# Patient Record
Sex: Male | Born: 1945 | Race: Black or African American | Hispanic: No | Smoking: Never smoker
Health system: Southern US, Community
[De-identification: ages and names within clinical notes are randomized; demographics above are authoritative.]

## PROBLEM LIST (undated history)

## (undated) DIAGNOSIS — R0600 Dyspnea, unspecified: Secondary | ICD-10-CM

## (undated) DIAGNOSIS — E119 Type 2 diabetes mellitus without complications: Secondary | ICD-10-CM

## (undated) DIAGNOSIS — I10 Essential (primary) hypertension: Secondary | ICD-10-CM

## (undated) DIAGNOSIS — Q2112 Patent foramen ovale: Secondary | ICD-10-CM

## (undated) DIAGNOSIS — R011 Cardiac murmur, unspecified: Secondary | ICD-10-CM

## (undated) DIAGNOSIS — IMO0001 Reserved for inherently not codable concepts without codable children: Secondary | ICD-10-CM

## (undated) DIAGNOSIS — I712 Thoracic aortic aneurysm, without rupture, unspecified: Secondary | ICD-10-CM

## (undated) DIAGNOSIS — E785 Hyperlipidemia, unspecified: Secondary | ICD-10-CM

## (undated) DIAGNOSIS — I452 Bifascicular block: Secondary | ICD-10-CM

## (undated) DIAGNOSIS — Z87442 Personal history of urinary calculi: Secondary | ICD-10-CM

## (undated) DIAGNOSIS — I77810 Thoracic aortic ectasia: Secondary | ICD-10-CM

## (undated) DIAGNOSIS — I480 Paroxysmal atrial fibrillation: Secondary | ICD-10-CM

## (undated) DIAGNOSIS — N183 Chronic kidney disease, stage 3 unspecified: Secondary | ICD-10-CM

## (undated) DIAGNOSIS — I639 Cerebral infarction, unspecified: Secondary | ICD-10-CM

## (undated) DIAGNOSIS — E559 Vitamin D deficiency, unspecified: Secondary | ICD-10-CM

## (undated) DIAGNOSIS — I2510 Atherosclerotic heart disease of native coronary artery without angina pectoris: Secondary | ICD-10-CM

## (undated) DIAGNOSIS — I493 Ventricular premature depolarization: Secondary | ICD-10-CM

## (undated) DIAGNOSIS — I509 Heart failure, unspecified: Secondary | ICD-10-CM

## (undated) DIAGNOSIS — Z794 Long term (current) use of insulin: Secondary | ICD-10-CM

## (undated) DIAGNOSIS — C61 Malignant neoplasm of prostate: Principal | ICD-10-CM

## (undated) DIAGNOSIS — I4719 Other supraventricular tachycardia: Secondary | ICD-10-CM

## (undated) DIAGNOSIS — R972 Elevated prostate specific antigen [PSA]: Secondary | ICD-10-CM

## (undated) DIAGNOSIS — N200 Calculus of kidney: Secondary | ICD-10-CM

## (undated) DIAGNOSIS — I471 Supraventricular tachycardia: Secondary | ICD-10-CM

## (undated) DIAGNOSIS — Z91041 Radiographic dye allergy status: Secondary | ICD-10-CM

## (undated) DIAGNOSIS — Q211 Atrial septal defect: Secondary | ICD-10-CM

## (undated) DIAGNOSIS — M1990 Unspecified osteoarthritis, unspecified site: Secondary | ICD-10-CM

## (undated) DIAGNOSIS — M109 Gout, unspecified: Secondary | ICD-10-CM

## (undated) DIAGNOSIS — D751 Secondary polycythemia: Secondary | ICD-10-CM

## (undated) HISTORY — DX: Atrial septal defect: Q21.1

## (undated) HISTORY — DX: Type 2 diabetes mellitus without complications: E11.9

## (undated) HISTORY — DX: Elevated prostate specific antigen (PSA): R97.20

## (undated) HISTORY — DX: Hyperlipidemia, unspecified: E78.5

## (undated) HISTORY — DX: Essential (primary) hypertension: I10

## (undated) HISTORY — PX: TONSILLECTOMY: SUR1361

## (undated) HISTORY — DX: Secondary polycythemia: D75.1

## (undated) HISTORY — DX: Vitamin D deficiency, unspecified: E55.9

## (undated) HISTORY — DX: Thoracic aortic ectasia: I77.810

## (undated) HISTORY — DX: Patent foramen ovale: Q21.12

## (undated) HISTORY — DX: Cerebral infarction, unspecified: I63.9

## (undated) HISTORY — PX: JOINT REPLACEMENT: SHX530

## (undated) HISTORY — DX: Ventricular premature depolarization: I49.3

## (undated) HISTORY — PX: PROSTATE BIOPSY: SHX241

## (undated) HISTORY — DX: Long term (current) use of insulin: Z79.4

## (undated) HISTORY — DX: Calculus of kidney: N20.0

## (undated) HISTORY — DX: Supraventricular tachycardia: I47.1

## (undated) HISTORY — DX: Paroxysmal atrial fibrillation: I48.0

## (undated) HISTORY — DX: Other supraventricular tachycardia: I47.19

## (undated) HISTORY — DX: Reserved for inherently not codable concepts without codable children: IMO0001

## (undated) HISTORY — PX: INSERTION PROSTATE RADIATION SEED: SUR718

---

## 1997-10-26 ENCOUNTER — Encounter: Admission: RE | Admit: 1997-10-26 | Discharge: 1997-10-26 | Payer: Self-pay | Admitting: *Deleted

## 1998-02-06 ENCOUNTER — Ambulatory Visit (HOSPITAL_COMMUNITY): Admission: RE | Admit: 1998-02-06 | Discharge: 1998-02-06 | Payer: Self-pay | Admitting: Gastroenterology

## 1999-05-06 ENCOUNTER — Encounter: Admission: RE | Admit: 1999-05-06 | Discharge: 1999-05-06 | Payer: Self-pay | Admitting: Internal Medicine

## 1999-05-06 ENCOUNTER — Encounter: Payer: Self-pay | Admitting: Internal Medicine

## 1999-11-11 ENCOUNTER — Encounter: Admission: RE | Admit: 1999-11-11 | Discharge: 2000-02-09 | Payer: Self-pay | Admitting: Internal Medicine

## 2000-07-30 ENCOUNTER — Emergency Department (HOSPITAL_COMMUNITY): Admission: EM | Admit: 2000-07-30 | Discharge: 2000-07-30 | Payer: Self-pay | Admitting: Emergency Medicine

## 2000-07-31 ENCOUNTER — Emergency Department (HOSPITAL_COMMUNITY): Admission: EM | Admit: 2000-07-31 | Discharge: 2000-07-31 | Payer: Self-pay | Admitting: Family Medicine

## 2000-08-02 ENCOUNTER — Emergency Department (HOSPITAL_COMMUNITY): Admission: EM | Admit: 2000-08-02 | Discharge: 2000-08-02 | Payer: Self-pay | Admitting: Emergency Medicine

## 2001-04-08 ENCOUNTER — Encounter: Payer: Self-pay | Admitting: Internal Medicine

## 2001-04-08 ENCOUNTER — Inpatient Hospital Stay (HOSPITAL_COMMUNITY): Admission: EM | Admit: 2001-04-08 | Discharge: 2001-04-10 | Payer: Self-pay | Admitting: Emergency Medicine

## 2001-04-08 ENCOUNTER — Encounter: Payer: Self-pay | Admitting: Emergency Medicine

## 2001-04-10 ENCOUNTER — Encounter: Payer: Self-pay | Admitting: Internal Medicine

## 2001-10-12 ENCOUNTER — Encounter: Admission: RE | Admit: 2001-10-12 | Discharge: 2001-10-12 | Payer: Self-pay | Admitting: Internal Medicine

## 2001-10-12 ENCOUNTER — Encounter: Payer: Self-pay | Admitting: Internal Medicine

## 2002-01-21 ENCOUNTER — Emergency Department (HOSPITAL_COMMUNITY): Admission: EM | Admit: 2002-01-21 | Discharge: 2002-01-21 | Payer: Self-pay | Admitting: Emergency Medicine

## 2003-04-13 ENCOUNTER — Ambulatory Visit (HOSPITAL_COMMUNITY): Admission: RE | Admit: 2003-04-13 | Discharge: 2003-04-13 | Payer: Self-pay | Admitting: Urology

## 2004-02-25 HISTORY — PX: LITHOTRIPSY: SUR834

## 2006-03-02 ENCOUNTER — Ambulatory Visit (HOSPITAL_COMMUNITY): Admission: RE | Admit: 2006-03-02 | Discharge: 2006-03-02 | Payer: Self-pay | Admitting: Otolaryngology

## 2006-08-20 ENCOUNTER — Encounter: Admission: RE | Admit: 2006-08-20 | Discharge: 2006-08-20 | Payer: Self-pay | Admitting: Internal Medicine

## 2008-06-23 ENCOUNTER — Emergency Department (HOSPITAL_COMMUNITY): Admission: EM | Admit: 2008-06-23 | Discharge: 2008-06-23 | Payer: Self-pay | Admitting: Emergency Medicine

## 2009-03-13 ENCOUNTER — Emergency Department (HOSPITAL_COMMUNITY): Admission: EM | Admit: 2009-03-13 | Discharge: 2009-03-13 | Payer: Self-pay | Admitting: Emergency Medicine

## 2010-05-12 LAB — GLUCOSE, CAPILLARY: Glucose-Capillary: 200 mg/dL — ABNORMAL HIGH (ref 70–99)

## 2010-05-12 LAB — SEDIMENTATION RATE: Sed Rate: 2 mm/hr (ref 0–16)

## 2010-06-05 LAB — BASIC METABOLIC PANEL
BUN: 19 mg/dL (ref 6–23)
CO2: 27 mEq/L (ref 19–32)
Calcium: 9.1 mg/dL (ref 8.4–10.5)
Chloride: 108 mEq/L (ref 96–112)
Creatinine, Ser: 1.3 mg/dL (ref 0.4–1.5)
GFR calc Af Amer: >60 mL/min (ref >60)
GFR calc non Af Amer: 56 mL/min — ABNORMAL LOW (ref >60)
Glucose, Bld: 174 mg/dL — ABNORMAL HIGH (ref 70–99)
Potassium: 3.9 mEq/L (ref 3.5–5.1)
Sodium: 142 mEq/L (ref 135–145)

## 2010-06-05 LAB — CBC
HCT: 44.4 % (ref 39.0–52.0)
Hemoglobin: 15 g/dL (ref 13.0–17.0)
MCHC: 33.8 g/dL (ref 30.0–36.0)
MCV: 92.7 fL (ref 78.0–100.0)
Platelets: 166 10*3/uL (ref 150–400)
RBC: 4.8 MIL/uL (ref 4.22–5.81)
RDW: 13.7 % (ref 11.5–15.5)
WBC: 6.4 10*3/uL (ref 4.0–10.5)

## 2010-06-05 LAB — POCT CARDIAC MARKERS
CKMB, poc: <1 ng/mL — ABNORMAL LOW (ref 1.0–8.0)
CKMB, poc: <1 ng/mL — ABNORMAL LOW (ref 1.0–8.0)
Myoglobin, poc: 62.7 ng/mL (ref 12–200)
Myoglobin, poc: 76.3 ng/mL (ref 12–200)
Troponin i, poc: <0.05 ng/mL (ref 0.00–0.09)
Troponin i, poc: <0.05 ng/mL (ref 0.00–0.09)

## 2010-06-05 LAB — DIFFERENTIAL
Basophils Absolute: 0 10*3/uL (ref 0.0–0.1)
Basophils Relative: 1 % (ref 0–1)
Eosinophils Absolute: 0.1 10*3/uL (ref 0.0–0.7)
Eosinophils Relative: 2 % (ref 0–5)
Lymphocytes Relative: 27 % (ref 12–46)
Lymphs Abs: 1.7 10*3/uL (ref 0.7–4.0)
Monocytes Absolute: 0.5 10*3/uL (ref 0.1–1.0)
Monocytes Relative: 9 % (ref 3–12)
Neutro Abs: 4 10*3/uL (ref 1.7–7.7)
Neutrophils Relative %: 62 % (ref 43–77)

## 2010-07-12 NOTE — Cardiovascular Report (Signed)
[place]  Patient:    James Mcclain, James Mcclain Visit Number: [id number] MRN: [id number]          Service Type: MED Location: [phone] Attending Physician:  Gwynneth Aliment Dictated by:   Runell Gess, M.D. Admit Date:  04/08/2001   CC:         Cardiac Catheterization Lab  [facility], [address], [place] [address]  Velna Hatchet, M.D.   Cardiac Catheterization  HISTORY:  James Mcclain is a 65 year old black male admitted April 08, 2001, by Dr. Allyne Gee for chest pain.  He ruled out for myocardial infarction.  There were no ischemic changes on his electrocardiogram.  He does have positive cardiac risk factors.  He was placed on IV heparin and nitroglycerin and presents now for diagnostic coronary arteriography.  DESCRIPTION OF PROCEDURE:  The patient was brought to the second floor [facility] in the postabsorptive state.  He was premedicated with p.o. Valium, IV Solu-Medrol, Benadryl, and Pepcid for contrast allergy prophylaxis.  His right groin was prepped and shaved in the usual sterile fashion.  Xylocaine, 1%, was used for local anesthesia.  A #6 French sheath was inserted into the right femoral artery using the standard Seldinger technique.  The #6 French right and left Judkins diagnostic catheters along with a #6 French pigtail catheter were used for selective coronary angiography, left ventriculography, supravalvular aortography, and distal abdominal aortography.  Omnipaque dye was used for the entirety of the case.  Retrograde aortic and left ventricular pullback pressures were recorded.  HEMODYNAMICS:  Aortic systolic pressure 128, diastolic pressure 82.  Left ventricular systolic pressure 127, end diastolic pressure 12.  SELECTIVE CORONARY ANGIOGRAPHY: 1. Left main:  Normal. 2. LAD:  The LAD had minor irregularities in the mid to distal portion. 3. Left circumflex:  Free of significant  disease. 4. Ramus intermedius branch:  Moderate in size and free of significant    disease. 5. Right coronary artery:  Dominant and free of significant disease.  LEFT VENTRICULOGRAPHY:  RAO left ventriculogram was performed using 20 cc of Omnipaque dye at 10 cc per second.  The overall LVEF was estimated at greater than 50% without focal wall motion abnormalities.  DISTAL ABDOMINAL AORTOGRAPHY:  Distal abdominal aortogram was performed using 25 cc of Omnipaque dye at 20 cc per second.  The renal arteries were widely patent.  The infrarenal abdominal aorta and the iliac bifurcation appeared free of significant atherosclerotic changes.  SUPRAVALVULAR AORTOGRAPHY:  This was performed in the LAO cranial view using 25 cc of Omnipaque dye at 20 cc per second.  There was no significant AI noted.  The aortic root and ascending aorta appeared dilated.  There was no obvious dissection.  IMPRESSION:  James Mcclain has essentially normal coronary arteries, normal left ventricular function, a dilating ascending aorta of unclear significance. This may be related to hypertension.  His renal arteries are widely patent. He will need contrast chest CT to assess the dimensions of his aorta.  This will probably have to be done tomorrow because of his dye load today.  If this is within normal limits, he can be discharged home for routine follow-up.  He left the lab in stable condition.  Dr. Velna Hatchet was notified of these results. Dictated by:   Runell Gess, M.D. Attending Physician:  Gwynneth Aliment DD:  04/09/01 TD:  04/09/01 Job: [id number]

## 2011-01-20 ENCOUNTER — Encounter (HOSPITAL_COMMUNITY): Payer: Self-pay | Admitting: Pharmacy Technician

## 2011-01-23 ENCOUNTER — Other Ambulatory Visit: Payer: Self-pay | Admitting: Orthopedic Surgery

## 2011-01-23 ENCOUNTER — Other Ambulatory Visit: Payer: Self-pay

## 2011-01-23 ENCOUNTER — Encounter (HOSPITAL_COMMUNITY): Payer: Self-pay

## 2011-01-23 ENCOUNTER — Encounter (HOSPITAL_COMMUNITY)
Admission: RE | Admit: 2011-01-23 | Discharge: 2011-01-23 | Disposition: A | Discharge status: Home / Self Care | Payer: Medicare Other | Source: Ambulatory Visit | Attending: Orthopedic Surgery | Admitting: Orthopedic Surgery

## 2011-01-23 HISTORY — DX: Unspecified osteoarthritis, unspecified site: M19.90

## 2011-01-23 LAB — CBC
HCT: 50.9 % (ref 39.0–52.0)
Hemoglobin: 17.1 g/dL — ABNORMAL HIGH (ref 13.0–17.0)
MCH: 32.1 pg (ref 26.0–34.0)
MCHC: 33.6 g/dL (ref 30.0–36.0)
MCV: 95.7 fL (ref 78.0–100.0)
Platelets: 189 10*3/uL (ref 150–400)
RBC: 5.32 MIL/uL (ref 4.22–5.81)
RDW: 14 % (ref 11.5–15.5)
WBC: 5.8 10*3/uL (ref 4.0–10.5)

## 2011-01-23 LAB — COMPREHENSIVE METABOLIC PANEL
ALT: 25 U/L (ref 0–53)
AST: 20 U/L (ref 0–37)
Albumin: 3.8 g/dL (ref 3.5–5.2)
Alkaline Phosphatase: 69 U/L (ref 39–117)
BUN: 22 mg/dL (ref 6–23)
CO2: 28 mEq/L (ref 19–32)
Calcium: 9.2 mg/dL (ref 8.4–10.5)
Chloride: 101 mEq/L (ref 96–112)
Creatinine, Ser: 1.4 mg/dL — ABNORMAL HIGH (ref 0.50–1.35)
GFR calc Af Amer: 60 mL/min — ABNORMAL LOW (ref >90)
GFR calc non Af Amer: 52 mL/min — ABNORMAL LOW (ref >90)
Glucose, Bld: 203 mg/dL — ABNORMAL HIGH (ref 70–99)
Potassium: 4.1 mEq/L (ref 3.5–5.1)
Sodium: 141 mEq/L (ref 135–145)
Total Bilirubin: 0.5 mg/dL (ref 0.3–1.2)
Total Protein: 7.7 g/dL (ref 6.0–8.3)

## 2011-01-23 LAB — SURGICAL PCR SCREEN
MRSA, PCR: NEGATIVE
Staphylococcus aureus: NEGATIVE

## 2011-01-23 LAB — TYPE AND SCREEN
ABO/RH(D): A POS
Antibody Screen: NEGATIVE

## 2011-01-23 LAB — URINALYSIS, ROUTINE W REFLEX MICROSCOPIC
Bilirubin Urine: NEGATIVE
Glucose, UA: 100 mg/dL — AB
Hgb urine dipstick: NEGATIVE
Ketones, ur: NEGATIVE mg/dL
Leukocytes, UA: NEGATIVE
Nitrite: NEGATIVE
Protein, ur: 30 mg/dL — AB
Specific Gravity, Urine: 1.021 (ref 1.005–1.030)
Urobilinogen, UA: 0.2 mg/dL (ref 0.0–1.0)
pH: 5 (ref 5.0–8.0)

## 2011-01-23 LAB — DIFFERENTIAL
Basophils Absolute: 0 10*3/uL (ref 0.0–0.1)
Basophils Relative: 0 % (ref 0–1)
Eosinophils Absolute: 0.2 10*3/uL (ref 0.0–0.7)
Eosinophils Relative: 3 % (ref 0–5)
Lymphocytes Relative: 36 % (ref 12–46)
Lymphs Abs: 2.1 10*3/uL (ref 0.7–4.0)
Monocytes Absolute: 0.9 10*3/uL (ref 0.1–1.0)
Monocytes Relative: 15 % — ABNORMAL HIGH (ref 3–12)
Neutro Abs: 2.7 10*3/uL (ref 1.7–7.7)
Neutrophils Relative %: 46 % (ref 43–77)

## 2011-01-23 LAB — URINE MICROSCOPIC-ADD ON

## 2011-01-23 LAB — ABO/RH: ABO/RH(D): A POS

## 2011-01-23 LAB — APTT: aPTT: 30 seconds (ref 24–37)

## 2011-01-23 LAB — PROTIME-INR
INR: 1 (ref 0.00–1.49)
Prothrombin Time: 13.4 seconds (ref 11.6–15.2)

## 2011-01-23 MED ORDER — CHLORHEXIDINE GLUCONATE 4 % EX LIQD: 60.0000 mL | Freq: Once | CUTANEOUS | Status: DC

## 2011-01-23 MED ORDER — CEFAZOLIN SODIUM-DEXTROSE 2-3 GM-% IV SOLR
2.0000 g | INTRAVENOUS | Status: AC
  Administered 2011-01-24: 2 g via INTRAVENOUS
  Filled 2011-01-23: qty 50

## 2011-01-23 NOTE — Consult Note (Signed)
Anesthesia:  Patient is a 65 year old male for a left TKA tomorrow.  His PAT appointment was this afternoon.  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  He did not report any CV symptoms.  He has had several prior EKGs at [facility] dating back to Feb 2003 which also show evidence of inferior infarct.  He actually underwent cardiac catheterization by Dr. Allyson Sabal on 04/08/01 (copy from E-chart in chart).  This showed "essentially normal coronary arteries, normal left ventricular function...".  His ascending aorta appeared dilated per cath findings, but he underwent a chest CT on 04/10/01 which showed a normal appearing thoracic aorta.  Clinical correlation day of surgery, but if remains asymptomatic then anticipate that he can proceed.  Additional past medical history includes DM2, HTN, hypercholesterolemia.     CXR shows mild COPD. Labs show hyperglycemia and mild RI with Cr 1.4.  He will get a CBG on arrival to Short Stay tomorrow.  Dr. Luiz Blare can follow his Cr post-op.

## 2011-01-23 NOTE — Pre-Procedure Instructions (Signed)
21 James Mcclain  01/23/2011   Your procedure is scheduled on: Friday, November 30TH  Report to [facility] at  5:30 AM.  Call this number if you have problems the morning of surgery: [phone]   Remember:   Do not eat food:After Midnight THURSDAY.   May have clear liquids: up to 4 Hours before arrival (UNTIL 1:30AM).   Clear liquids include soda, tea, black coffee, apple or grape juice, broth.  Take these medicines the morning of surgery with A SIP OF WATER:  NONE   Do not wear jewelry, make-up or nail polish.  Do not wear lotions, powders, or perfumes. You may wear deodorant.  Do not shave 48 hours prior to surgery.   Do not bring valuables to the hospital.   Contacts, dentures or bridgework may not be worn into surgery.  Leave suitcase in the car. After surgery it may be brought to your room.  For patients admitted to the hospital, checkout time is 11:00 AM the day of discharge.   Patients discharged the day of surgery will not be allowed to drive home.  Name and phone number of your driver: [facility]                                                                    Special Instructions: CHG Shower Use Special Wash: 1/2 bottle night before surgery and 1/2 bottle morning of surgery.   Please read over the following fact sheets that you were given: Pain Booklet, Blood Transfusion Information, MRSA Information and Surgical Site Infection Prevention

## 2011-01-23 NOTE — Progress Notes (Signed)
SPOKE WITH DR  Michelle Piper  REGARDING THIS PATIENTS'  EVENING DOSE OF INSULIN....MD WOULD LIKE PT TO CUT HIS INSULIN  BY  HALF (1/2)...SO, PT WILL ONLY TAKE 15 UNITS INSTEAD OF HIS NORMAL  30 UNITS AT BEDTIME AND HE IS AWARE

## 2011-01-24 ENCOUNTER — Inpatient Hospital Stay (HOSPITAL_COMMUNITY)
Admission: RE | Admit: 2011-01-24 | Discharge: 2011-01-27 | DRG: 470 | Disposition: A | Discharge status: Home Health | Payer: Medicare Other | Source: Ambulatory Visit | Attending: Orthopedic Surgery | Admitting: Orthopedic Surgery

## 2011-01-24 ENCOUNTER — Encounter (HOSPITAL_COMMUNITY): Payer: Self-pay | Admitting: Vascular Surgery

## 2011-01-24 ENCOUNTER — Encounter (HOSPITAL_COMMUNITY)
Admission: RE | Disposition: A | Discharge status: Home Health | Payer: Self-pay | Source: Ambulatory Visit | Attending: Orthopedic Surgery

## 2011-01-24 ENCOUNTER — Inpatient Hospital Stay (HOSPITAL_COMMUNITY): Payer: Medicare Other | Admitting: Vascular Surgery

## 2011-01-24 DIAGNOSIS — Z01812 Encounter for preprocedural laboratory examination: Secondary | ICD-10-CM

## 2011-01-24 DIAGNOSIS — I10 Essential (primary) hypertension: Secondary | ICD-10-CM | POA: Diagnosis present

## 2011-01-24 DIAGNOSIS — E119 Type 2 diabetes mellitus without complications: Secondary | ICD-10-CM | POA: Diagnosis present

## 2011-01-24 DIAGNOSIS — M1710 Unilateral primary osteoarthritis, unspecified knee: Principal | ICD-10-CM | POA: Diagnosis present

## 2011-01-24 DIAGNOSIS — Z91013 Allergy to seafood: Secondary | ICD-10-CM

## 2011-01-24 DIAGNOSIS — Z96659 Presence of unspecified artificial knee joint: Secondary | ICD-10-CM

## 2011-01-24 HISTORY — PX: TOTAL KNEE ARTHROPLASTY: SHX125

## 2011-01-24 LAB — GLUCOSE, CAPILLARY
Glucose-Capillary: 177 mg/dL — ABNORMAL HIGH (ref 70–99)
Glucose-Capillary: 181 mg/dL — ABNORMAL HIGH (ref 70–99)
Glucose-Capillary: 189 mg/dL — ABNORMAL HIGH (ref 70–99)

## 2011-01-24 SURGERY — ARTHROPLASTY, KNEE, TOTAL
Anesthesia: Regional | Site: Knee | Laterality: Left | Wound class: Clean

## 2011-01-24 MED ORDER — WARFARIN SODIUM 7.5 MG PO TABS
7.5000 mg | ORAL_TABLET | Freq: Once | ORAL | Status: AC
  Administered 2011-01-24: 7.5 mg via ORAL
  Filled 2011-01-24: qty 1

## 2011-01-24 MED ORDER — OLMESARTAN MEDOXOMIL 40 MG PO TABS
40.0000 mg | ORAL_TABLET | Freq: Every day | ORAL | Status: DC
  Administered 2011-01-24 – 2011-01-27 (×4): 40 mg via ORAL
  Filled 2011-01-24 (×4): qty 1

## 2011-01-24 MED ORDER — ONDANSETRON HCL 4 MG/2ML IJ SOLN
4.0000 mg | Freq: Four times a day (QID) | INTRAMUSCULAR | Status: DC | PRN
  Administered 2011-01-25: 4 mg via INTRAVENOUS
  Filled 2011-01-24: qty 2

## 2011-01-24 MED ORDER — ROCURONIUM BROMIDE 100 MG/10ML IV SOLN
INTRAVENOUS | Status: DC | PRN
  Administered 2011-01-24: 50 mg via INTRAVENOUS

## 2011-01-24 MED ORDER — INSULIN ASPART 100 UNIT/ML ~~LOC~~ SOLN
0.0000 [IU] | Freq: Three times a day (TID) | SUBCUTANEOUS | Status: DC
  Administered 2011-01-24 – 2011-01-25 (×3): 3 [IU] via SUBCUTANEOUS
  Administered 2011-01-25: 2 [IU] via SUBCUTANEOUS
  Administered 2011-01-26 – 2011-01-27 (×4): 3 [IU] via SUBCUTANEOUS
  Filled 2011-01-24: qty 3

## 2011-01-24 MED ORDER — HYDROMORPHONE HCL PF 1 MG/ML IJ SOLN: 0.2500 mg | INTRAMUSCULAR | Status: DC | PRN

## 2011-01-24 MED ORDER — MIDAZOLAM HCL 2 MG/2ML IJ SOLN: 1.0000 mg | INTRAMUSCULAR | Status: DC | PRN

## 2011-01-24 MED ORDER — INSULIN GLARGINE 100 UNIT/ML ~~LOC~~ SOLN
20.0000 [IU] | Freq: Every day | SUBCUTANEOUS | Status: DC
  Administered 2011-01-24 – 2011-01-26 (×3): 20 [IU] via SUBCUTANEOUS
  Filled 2011-01-24: qty 3

## 2011-01-24 MED ORDER — PIOGLITAZONE HCL 45 MG PO TABS
45.0000 mg | ORAL_TABLET | Freq: Every day | ORAL | Status: DC
  Administered 2011-01-24 – 2011-01-27 (×4): 45 mg via ORAL
  Filled 2011-01-24 (×4): qty 1

## 2011-01-24 MED ORDER — ROSUVASTATIN CALCIUM 20 MG PO TABS
20.0000 mg | ORAL_TABLET | Freq: Every day | ORAL | Status: DC
  Administered 2011-01-24 – 2011-01-27 (×4): 20 mg via ORAL
  Filled 2011-01-24 (×4): qty 1

## 2011-01-24 MED ORDER — PROPOFOL 10 MG/ML IV EMUL
INTRAVENOUS | Status: DC | PRN
  Administered 2011-01-24: 200 mg via INTRAVENOUS

## 2011-01-24 MED ORDER — OXYCODONE HCL 5 MG PO TABS
5.0000 mg | ORAL_TABLET | ORAL | Status: DC | PRN
  Administered 2011-01-25: 5 mg via ORAL
  Administered 2011-01-25 – 2011-01-26 (×5): 10 mg via ORAL
  Administered 2011-01-26: 5 mg via ORAL
  Filled 2011-01-24 (×6): qty 2

## 2011-01-24 MED ORDER — FERROUS SULFATE 325 (65 FE) MG PO TABS
325.0000 mg | ORAL_TABLET | Freq: Two times a day (BID) | ORAL | Status: DC
  Administered 2011-01-24 – 2011-01-27 (×6): 325 mg via ORAL
  Filled 2011-01-24 (×8): qty 1

## 2011-01-24 MED ORDER — MORPHINE SULFATE (PF) 1 MG/ML IV SOLN
INTRAVENOUS | Status: DC
  Administered 2011-01-24: 12:00:00 via INTRAVENOUS
  Administered 2011-01-24: 12 mg via INTRAVENOUS
  Administered 2011-01-24: 3 mg via INTRAVENOUS
  Administered 2011-01-25: 1 mg via INTRAVENOUS
  Administered 2011-01-25: 3 mg via INTRAVENOUS
  Administered 2011-01-25: 06:00:00 via INTRAVENOUS
  Administered 2011-01-25: 3 mg via INTRAVENOUS
  Administered 2011-01-25: 1 mg via INTRAVENOUS
  Administered 2011-01-25: 4 mg via INTRAVENOUS
  Administered 2011-01-26: 2 mg via INTRAVENOUS
  Filled 2011-01-24 (×3): qty 25

## 2011-01-24 MED ORDER — ACETAMINOPHEN 10 MG/ML IV SOLN
1000.0000 mg | Freq: Four times a day (QID) | INTRAVENOUS | Status: AC
  Administered 2011-01-24 – 2011-01-25 (×3): 1000 mg via INTRAVENOUS
  Filled 2011-01-24 (×5): qty 100

## 2011-01-24 MED ORDER — DOCUSATE SODIUM 100 MG PO CAPS
100.0000 mg | ORAL_CAPSULE | Freq: Two times a day (BID) | ORAL | Status: DC
  Administered 2011-01-24 – 2011-01-27 (×6): 100 mg via ORAL
  Filled 2011-01-24 (×8): qty 1

## 2011-01-24 MED ORDER — SODIUM CHLORIDE 0.9 % IJ SOLN: 9.0000 mL | INTRAMUSCULAR | Status: DC | PRN

## 2011-01-24 MED ORDER — ACETAMINOPHEN 325 MG PO TABS
650.0000 mg | ORAL_TABLET | Freq: Four times a day (QID) | ORAL | Status: DC | PRN
  Administered 2011-01-26: 650 mg via ORAL
  Filled 2011-01-24: qty 2

## 2011-01-24 MED ORDER — LACTATED RINGERS IV SOLN
INTRAVENOUS | Status: DC | PRN
  Administered 2011-01-24 (×3): via INTRAVENOUS

## 2011-01-24 MED ORDER — ONDANSETRON HCL 4 MG PO TABS: 4.0000 mg | ORAL_TABLET | Freq: Four times a day (QID) | ORAL | Status: DC | PRN

## 2011-01-24 MED ORDER — ZOLPIDEM TARTRATE 5 MG PO TABS: 5.0000 mg | ORAL_TABLET | Freq: Every evening | ORAL | Status: DC | PRN

## 2011-01-24 MED ORDER — BUPIVACAINE-EPINEPHRINE PF 0.5-1:200000 % IJ SOLN
INTRAMUSCULAR | Status: DC | PRN
  Administered 2011-01-24: 30 mL

## 2011-01-24 MED ORDER — PHENYLEPHRINE HCL 10 MG/ML IJ SOLN
INTRAMUSCULAR | Status: DC | PRN
  Administered 2011-01-24 (×2): 80 ug via INTRAVENOUS

## 2011-01-24 MED ORDER — SITAGLIPTIN PHOS-METFORMIN HCL 50-1000 MG PO TABS: 1.0000 | ORAL_TABLET | Freq: Every day | ORAL | Status: DC

## 2011-01-24 MED ORDER — POVIDONE-IODINE 7.5 % EX SOLN: Freq: Once | CUTANEOUS | Status: DC

## 2011-01-24 MED ORDER — CEFUROXIME SODIUM 1.5 G IJ SOLR
INTRAMUSCULAR | Status: DC | PRN
  Administered 2011-01-24: 1.5 g

## 2011-01-24 MED ORDER — WARFARIN VIDEO: Freq: Once | Status: DC

## 2011-01-24 MED ORDER — ONDANSETRON HCL 4 MG/2ML IJ SOLN: 4.0000 mg | Freq: Four times a day (QID) | INTRAMUSCULAR | Status: DC | PRN

## 2011-01-24 MED ORDER — INFLUENZA VIRUS VACC SPLIT PF IM SUSP
0.5000 mL | INTRAMUSCULAR | Status: AC
  Filled 2011-01-24: qty 0.5

## 2011-01-24 MED ORDER — HYDROCHLOROTHIAZIDE 25 MG PO TABS
25.0000 mg | ORAL_TABLET | Freq: Every day | ORAL | Status: DC
  Administered 2011-01-24 – 2011-01-27 (×4): 25 mg via ORAL
  Filled 2011-01-24 (×4): qty 1

## 2011-01-24 MED ORDER — ACETAMINOPHEN 10 MG/ML IV SOLN
INTRAVENOUS | Status: DC | PRN
  Administered 2011-01-24: 1000 mg via INTRAVENOUS

## 2011-01-24 MED ORDER — FENTANYL CITRATE 0.05 MG/ML IJ SOLN: 50.0000 ug | INTRAMUSCULAR | Status: DC | PRN

## 2011-01-24 MED ORDER — ACETAMINOPHEN 650 MG RE SUPP: 650.0000 mg | Freq: Four times a day (QID) | RECTAL | Status: DC | PRN

## 2011-01-24 MED ORDER — METHOCARBAMOL 100 MG/ML IJ SOLN
500.0000 mg | Freq: Four times a day (QID) | INTRAVENOUS | Status: DC | PRN
  Administered 2011-01-24: 500 mg via INTRAVENOUS
  Filled 2011-01-24: qty 5

## 2011-01-24 MED ORDER — NALOXONE HCL 0.4 MG/ML IJ SOLN: 0.4000 mg | INTRAMUSCULAR | Status: DC | PRN

## 2011-01-24 MED ORDER — FENTANYL CITRATE 0.05 MG/ML IJ SOLN
INTRAMUSCULAR | Status: DC | PRN
  Administered 2011-01-24: 50 ug via INTRAVENOUS
  Administered 2011-01-24: 100 ug via INTRAVENOUS
  Administered 2011-01-24 (×2): 50 ug via INTRAVENOUS

## 2011-01-24 MED ORDER — GLYCOPYRROLATE 0.2 MG/ML IJ SOLN
INTRAMUSCULAR | Status: DC | PRN
  Administered 2011-01-24: .4 mg via INTRAVENOUS

## 2011-01-24 MED ORDER — SODIUM CHLORIDE 0.9 % IV SOLN
INTRAVENOUS | Status: DC
  Administered 2011-01-24 – 2011-01-25 (×2): via INTRAVENOUS

## 2011-01-24 MED ORDER — MIDAZOLAM HCL 5 MG/5ML IJ SOLN
INTRAMUSCULAR | Status: DC | PRN
  Administered 2011-01-24: 2 mg via INTRAVENOUS

## 2011-01-24 MED ORDER — NEOSTIGMINE METHYLSULFATE 1 MG/ML IJ SOLN
INTRAMUSCULAR | Status: DC | PRN
  Administered 2011-01-24: 3 mg via INTRAVENOUS

## 2011-01-24 MED ORDER — ONDANSETRON HCL 4 MG/2ML IJ SOLN
INTRAMUSCULAR | Status: DC | PRN
  Administered 2011-01-24: 4 mg via INTRAVENOUS

## 2011-01-24 MED ORDER — PATIENT'S GUIDE TO USING COUMADIN BOOK
Freq: Once | Status: DC
  Filled 2011-01-24: qty 1

## 2011-01-24 MED ORDER — ACETAMINOPHEN 10 MG/ML IV SOLN
INTRAVENOUS | Status: AC
  Filled 2011-01-24: qty 100

## 2011-01-24 MED ORDER — LINAGLIPTIN 5 MG PO TABS
5.0000 mg | ORAL_TABLET | Freq: Every day | ORAL | Status: DC
  Administered 2011-01-25 – 2011-01-27 (×3): 5 mg via ORAL
  Filled 2011-01-24 (×5): qty 1

## 2011-01-24 MED ORDER — OLMESARTAN MEDOXOMIL-HCTZ 40-25 MG PO TABS: 1.0000 | ORAL_TABLET | Freq: Every day | ORAL | Status: DC

## 2011-01-24 MED ORDER — SODIUM CHLORIDE 0.9 % IR SOLN
Status: DC | PRN
  Administered 2011-01-24: 3000 mL

## 2011-01-24 MED ORDER — METHOCARBAMOL 500 MG PO TABS
500.0000 mg | ORAL_TABLET | Freq: Four times a day (QID) | ORAL | Status: DC | PRN
  Administered 2011-01-24 – 2011-01-26 (×2): 500 mg via ORAL
  Filled 2011-01-24 (×3): qty 1

## 2011-01-24 MED ORDER — DIPHENHYDRAMINE HCL 12.5 MG/5ML PO ELIX
12.5000 mg | ORAL_SOLUTION | Freq: Four times a day (QID) | ORAL | Status: DC | PRN
  Filled 2011-01-24: qty 5

## 2011-01-24 MED ORDER — DIPHENHYDRAMINE HCL 50 MG/ML IJ SOLN: 12.5000 mg | Freq: Four times a day (QID) | INTRAMUSCULAR | Status: DC | PRN

## 2011-01-24 MED ORDER — CEFAZOLIN SODIUM 1-5 GM-% IV SOLN
1.0000 g | Freq: Four times a day (QID) | INTRAVENOUS | Status: AC
  Administered 2011-01-24 (×3): 1 g via INTRAVENOUS
  Filled 2011-01-24 (×2): qty 50

## 2011-01-24 MED ORDER — METFORMIN HCL ER 500 MG PO TB24
1000.0000 mg | ORAL_TABLET | Freq: Every day | ORAL | Status: DC
  Administered 2011-01-24 – 2011-01-26 (×3): 1000 mg via ORAL
  Filled 2011-01-24 (×4): qty 2

## 2011-01-24 SURGICAL SUPPLY — 66 items
APL SKNCLS STERI-STRIP NONHPOA (GAUZE/BANDAGES/DRESSINGS) ×1
BANDAGE ELASTIC 6 VELCRO ST LF (GAUZE/BANDAGES/DRESSINGS) ×1 IMPLANT
BANDAGE ESMARK 6X9 LF (GAUZE/BANDAGES/DRESSINGS) ×1 IMPLANT
BENZOIN TINCTURE PRP APPL 2/3 (GAUZE/BANDAGES/DRESSINGS) ×2 IMPLANT
BLADE SAGITTAL 25.0X1.19X90 (BLADE) ×2 IMPLANT
BLADE SAW SAG 90X13X1.27 (BLADE) ×2 IMPLANT
BNDG CMPR 9X6 STRL LF SNTH (GAUZE/BANDAGES/DRESSINGS) ×1
BNDG ESMARK 6X9 LF (GAUZE/BANDAGES/DRESSINGS) ×2
BOWL SMART MIX CTS (DISPOSABLE) ×2 IMPLANT
CEMENT HV SMART SET (Cement) ×3 IMPLANT
CLOTH BEACON ORANGE TIMEOUT ST (SAFETY) ×2 IMPLANT
COVER BACK TABLE 24X17X13 BIG (DRAPES) IMPLANT
COVER SURGICAL LIGHT HANDLE (MISCELLANEOUS) ×2 IMPLANT
CUFF TOURNIQUET SINGLE 34IN LL (TOURNIQUET CUFF) ×2 IMPLANT
CUFF TOURNIQUET SINGLE 44IN (TOURNIQUET CUFF) IMPLANT
DRAPE EXTREMITY T 121X128X90 (DRAPE) ×2 IMPLANT
DRAPE U-SHAPE 47X51 STRL (DRAPES) ×2 IMPLANT
DRSG PAD ABDOMINAL 8X10 ST (GAUZE/BANDAGES/DRESSINGS) ×2 IMPLANT
DURAPREP 26ML APPLICATOR (WOUND CARE) ×2 IMPLANT
ELECT REM PT RETURN 9FT ADLT (ELECTROSURGICAL) ×2
ELECTRODE REM PT RTRN 9FT ADLT (ELECTROSURGICAL) ×1 IMPLANT
EVACUATOR 1/8 PVC DRAIN (DRAIN) ×2 IMPLANT
FACESHIELD LNG OPTICON STERILE (SAFETY) ×2 IMPLANT
GAUZE XEROFORM 5X9 LF (GAUZE/BANDAGES/DRESSINGS) ×2 IMPLANT
GLOVE BIOGEL PI IND STRL 8 (GLOVE) ×2 IMPLANT
GLOVE BIOGEL PI INDICATOR 8 (GLOVE) ×2
GLOVE ECLIPSE 7.5 STRL STRAW (GLOVE) ×4 IMPLANT
GOWN PREVENTION PLUS XLARGE (GOWN DISPOSABLE) ×3 IMPLANT
GOWN SRG XL XLNG 56XLVL 4 (GOWN DISPOSABLE) ×1 IMPLANT
GOWN STRL NON-REIN LRG LVL3 (GOWN DISPOSABLE) ×3 IMPLANT
GOWN STRL NON-REIN XL XLG LVL4 (GOWN DISPOSABLE) ×2
HANDPIECE INTERPULSE COAX TIP (DISPOSABLE) ×2
HOOD PEEL AWAY FACE SHEILD DIS (HOOD) ×5 IMPLANT
IMMOBILIZER KNEE 20 (SOFTGOODS)
IMMOBILIZER KNEE 20 THIGH 36 (SOFTGOODS) IMPLANT
IMMOBILIZER KNEE 22 UNIV (SOFTGOODS) ×2 IMPLANT
IMMOBILIZER KNEE 24 THIGH 36 (MISCELLANEOUS) IMPLANT
IMMOBILIZER KNEE 24 UNIV (MISCELLANEOUS)
KIT BASIN OR (CUSTOM PROCEDURE TRAY) ×2 IMPLANT
KIT ROOM TURNOVER OR (KITS) ×2 IMPLANT
MANIFOLD NEPTUNE II (INSTRUMENTS) ×2 IMPLANT
NDL HYPO 25GX1X1/2 BEV (NEEDLE) IMPLANT
NEEDLE HYPO 25GX1X1/2 BEV (NEEDLE) ×2 IMPLANT
NS IRRIG 1000ML POUR BTL (IV SOLUTION) ×2 IMPLANT
PACK TOTAL JOINT (CUSTOM PROCEDURE TRAY) ×2 IMPLANT
PAD ARMBOARD 7.5X6 YLW CONV (MISCELLANEOUS) ×3 IMPLANT
PAD CAST 4YDX4 CTTN HI CHSV (CAST SUPPLIES) ×1 IMPLANT
PADDING CAST COTTON 4X4 STRL (CAST SUPPLIES) ×2
PADDING WEBRIL 4 STERILE (GAUZE/BANDAGES/DRESSINGS) ×1 IMPLANT
PIN SCHANZ 4MM 130MM (PIN) ×8 IMPLANT
SET HNDPC FAN SPRY TIP SCT (DISPOSABLE) ×1 IMPLANT
SPONGE GAUZE 4X4 12PLY (GAUZE/BANDAGES/DRESSINGS) ×2 IMPLANT
STAPLER VISISTAT 35W (STAPLE) IMPLANT
STRIP CLOSURE SKIN 1/2X4 (GAUZE/BANDAGES/DRESSINGS) ×2 IMPLANT
SUCTION FRAZIER TIP 10 FR DISP (SUCTIONS) ×2 IMPLANT
SUT MON AB 3-0 SH 27 (SUTURE)
SUT MON AB 3-0 SH27 (SUTURE) IMPLANT
SUT VIC AB 0 CTB1 27 (SUTURE) ×4 IMPLANT
SUT VIC AB 1 CT1 27 (SUTURE) ×4
SUT VIC AB 1 CT1 27XBRD ANBCTR (SUTURE) ×2 IMPLANT
SUT VIC AB 2-0 CTB1 (SUTURE) ×4 IMPLANT
SYR CONTROL 10ML LL (SYRINGE) ×1 IMPLANT
TOWEL OR 17X24 6PK STRL BLUE (TOWEL DISPOSABLE) ×2 IMPLANT
TOWEL OR 17X26 10 PK STRL BLUE (TOWEL DISPOSABLE) ×2 IMPLANT
TRAY FOLEY CATH 14FR (SET/KITS/TRAYS/PACK) ×2 IMPLANT
WATER STERILE IRR 1000ML POUR (IV SOLUTION) ×5 IMPLANT

## 2011-01-24 NOTE — Progress Notes (Signed)
ANTICOAGULATION CONSULT NOTE - Initial Consult  Pharmacy Consult for coumadin Indication: VTE prophylaxis  Allergies  Allergen Reactions  . Fish Allergy Swelling    Vital Signs: Temp: 97.7 F (36.5 C) (11/30 1325) Temp src: Oral (11/30 1325) BP: 151/86 mmHg (11/30 1325) Pulse Rate: 86  (11/30 1325)  Labs:  [facility] 01/23/11 1459  HGB 17.1*  HCT 50.9  PLT 189  APTT 30  LABPROT 13.4  INR 1.00  HEPARINUNFRC --  CREATININE 1.40*  CKTOTAL --  CKMB --  TROPONINI --    Medical History: Past Medical History  Diagnosis Date  . Diabetes mellitus   . Hypertension   . Arthritis   . Cholesterol serum elevated     Assessment: 65 YOM s/o L total knee arthroplasty, to start coumadin for VTE prophylaxis. Baseline INR 1  Goal of Therapy:  INR 2-3   Plan:  Coumadin 7.5mg  po x 1 F/u INR in am  Riki Rusk 01/24/2011,2:15 PM

## 2011-01-24 NOTE — Brief Op Note (Signed)
  01/24/2011  PATIENT:  James Mcclain  65 y.o. male  PRE-OPERATIVE DIAGNOSIS:  degenerative joint disease left knee  POST-OPERATIVE DIAGNOSIS:  degenerative joint disease left knee  PROCEDURE:  Procedure(s): TOTAL KNEE ARTHROPLASTY  SURGEON: Jodi Geralds MD  PHYSICIAN ASSISTANT: Gus Puma PA-C  ANESTHESIA:  Gen  TOURNIQUET:  1 hr 10 min  To PACU IN STABLE CONDITION.   Deborh Pense G 01/24/2011, 9:48 AM

## 2011-01-24 NOTE — Anesthesia Procedure Notes (Addendum)
Anesthesia Regional Block:  Femoral nerve block  Pre-Anesthetic Checklist: ,, timeout performed, Correct Patient, Correct Site, Correct Laterality, Correct Procedure,, site marked, risks and benefits discussed, Surgical consent,  Pre-op evaluation,  At surgeon's request and post-op pain management  Laterality: Left  Prep: chloraprep       Needles:  Injection technique: Single-shot  Needle Type: Echogenic Stimulator Needle     Needle Length: 5cm 5 cm Needle Gauge: 21    Additional Needles:  Procedures: nerve stimulator Femoral nerve block  Nerve Stimulator or Paresthesia:  Response: Quadriceps muscle contraction, 0.45 mA,   Additional Responses:   Narrative:  Start time: 01/24/2011 6:55 AM End time: 01/24/2011 7:07 AM Injection made incrementally with aspirations every 5 mL.  Performed by: Personally  Anesthesiologist: Dr Chaney Malling  Additional Notes: Functioning IV was confirmed and monitors were applied.  A 90mm 21ga Arrow echogenic stimulator needle was used. Sterile prep and drape,hand hygiene and sterile gloves were used.  Negative aspiration and negative test dose prior to incremental administration of local anesthetic. The patient tolerated the procedure well.    Femoral nerve block

## 2011-01-24 NOTE — Op Note (Signed)
NAMESALBADOR, FIVEASH                  ACCOUNT NO.:  [id number]  MEDICAL RECORD NO.:  [id number]  LOCATION:  [facility]                         FACILITY:  [facility]  PHYSICIAN:  Harvie Junior, M.D.   DATE OF BIRTH:  20-Dec-1945  DATE OF PROCEDURE:  01/24/2011 DATE OF DISCHARGE:                              OPERATIVE REPORT   PREOPERATIVE DIAGNOSIS:  End-stage degenerative joint disease, left knee.  POSTOPERATIVE DIAGNOSIS:  End-stage degenerative joint disease, left knee.  PROCEDURE: 1. Left total knee replacement with a Sigma system, size 4 femur, size     5 tibia, 10-mm bridging bearing, and a 41-mm all polyethylene     patella. 2. Computer-assisted left total knee replacement.  SURGEON:  Harvie Junior, M.D.  ASSISTANT:  Marshia Ly, PA-C.  ANESTHESIA:  General.  BRIEF HISTORY:  Mr. Gatlyn is a 65 year old male with a long history of having had severe left knee pain.  He had been treated conservatively for a long period of time, activity modification, use of a cane, anti- inflammatory medication.  He failed injection therapy and anti- inflammatory medication, and x-rays showed bone-on-bone changes. Because of the prolonged and persistent night pain, the patient was also taken to the operating room for left total knee replacement.  Because of his young age and need for perfect long alignment, a computer assistance was chosen to be used preoperatively.  PROCEDURE:  The patient was brought to the operating room.  After adequate anesthesia was obtained with general anesthetic and a femoral nerve block.  The patient was placed supine on the operating table.  The left leg was then prepped and draped in an usual sterile fashion. Following this, attention was turned to the left knee where a midline incision was made in the subcutaneous tissue down the level of the extensor mechanism and a medial parapatellar arthrotomy was undertaken. Once this was completed, attention was turned towards  the knee where anterior and posterior cruciates were removed.  Medial and lateral meniscus, retropatellar fat pad, and the synovium anterior aspect of the femur.  Once this was completed, attention was turned towards placement of computer, 2 pins in the tibia, 2 pins in the femur, and the registration process undertaken about 30 minutes of surgical procedure. Once this was completed, attention was turned towards the tibia, which was cut perpendicular to its long axis under computer assistance.  Once this was completed, attention was turned to the femur, which was cut perpendicular to the anatomic axis under computer assistance.  Spacer blocks were put in place at this point and showed perfect neutral long alignment and gap balance.  Once this was completed, attention was turned towards the femur, which was sized to a 4.  Mediolaterally, it was much wider even though a little bit more generous in the 5, but it certainly sized to a 4 in the AP plane, which is what we need to follow up.  At this time, a 4 block was placed in the distal femur, and anterior and posterior cuts were made as well as chamfers and box.  Once this was completed, attention was turned towards the tibia, which was drilled and keeled with a  size 5, which had an excellent fit.  Once that was completed, a size 4 polyethylene spacer at 10 thick was placed and the knee put through a range of motion.  Excellent stability, perfect neutral long alignment, gap, balance according to computer assistance. At this point, attention was turned to the patella, which was cut down to a level of 14 mm and a 41 mm all-polyethylene patella paddle was chosen and this gave excellent fit.  Once this was completed, attention was turned towards range of motion and stability was excellent.  At this point, all trial components were removed.  The knee was copiously and thoroughly lavaged and suctioned dry.  Once that was completed, the final  components were cemented into place, size 5 tibia, size 4 femur, 10-mm bridging bearing trial was placed and a 41- mm all-polyethylene patella was placed and held with a clamp.  All excess bone cement was removed and the cement was allowed to harden completely.  Once this had been completed, the tourniquet was let down and attention was turned towards irrigating the knee.  Once this was done, the knee was copious and thoroughly irrigated.  All bleeders controlled with electrocautery and the final 10 poly was put in place and once that was put in place, the range of motion and stability was checked and was excellent and at this point, the knee was again irrigated, suctioned and dry.  The medial parapatellar arthrotomy was closed with a 1 Vicryl running over a medium Hemovac drain.  Skin was closed with 0 and 2-0 Vicryl and skin staples. Sterile compressive dressing was applied as well as knee immobilizer, and the patient was taken to recovery room and was noted to be in satisfactory condition.  Estimated blood loss for this procedure was less than 100 mL.     Harvie Junior, M.D.     Ranae Plumber  D:  01/24/2011  T:  01/24/2011  Job:  [id number]

## 2011-01-24 NOTE — Op Note (Signed)
Op note dictated [phone]

## 2011-01-24 NOTE — Anesthesia Preprocedure Evaluation (Addendum)
Anesthesia Evaluation  Patient identified by MRN, date of birth, ID band Patient awake    Reviewed: Allergy & Precautions, H&P , NPO status , Patient's Chart, lab work & pertinent test results, reviewed documented beta blocker date and time   Airway Mallampati: II  Neck ROM: full    Dental   Pulmonary          Cardiovascular hypertension,     Neuro/Psych    GI/Hepatic   Endo/Other  Diabetes mellitus-Morbid obesity  Renal/GU      Musculoskeletal   Abdominal   Peds  Hematology   Anesthesia Other Findings   Reproductive/Obstetrics                          Anesthesia Physical Anesthesia Plan  ASA: II  Anesthesia Plan: General   Post-op Pain Management:    Induction: Intravenous  Airway Management Planned: Oral ETT  Additional Equipment:   Intra-op Plan:   Post-operative Plan: Extubation in OR  Informed Consent: I have reviewed the patients History and Physical, chart, labs and discussed the procedure including the risks, benefits and alternatives for the proposed anesthesia with the patient or authorized representative who has indicated his/her understanding and acceptance.   Dental advisory given  Plan Discussed with: CRNA and Surgeon  Anesthesia Plan Comments:        Anesthesia Quick Evaluation

## 2011-01-24 NOTE — Transfer of Care (Signed)
Immediate Anesthesia Transfer of Care Note  Patient: James Mcclain  Procedure(s) Performed:  TOTAL KNEE ARTHROPLASTY - COMPUTER ASSISTED LEFT  TOTAL KNEE REPLACEMENT. Anesthesia a combination of regional and general.  Patient Location: PACU  Anesthesia Type: General  Level of Consciousness: awake, alert , oriented and patient cooperative  Airway & Oxygen Therapy: Patient Spontanous Breathing and Patient connected to nasal cannula oxygen  Post-op Assessment: Report given to PACU RN, Post -op Vital signs reviewed and stable and Patient moving all extremities  Post vital signs: Reviewed and stable  Complications: No apparent anesthesia complications

## 2011-01-24 NOTE — Plan of Care (Signed)
Problem: Consults Goal: Diagnosis- Total Joint Replacement R Total Knee Replacement

## 2011-01-24 NOTE — Anesthesia Postprocedure Evaluation (Signed)
Anesthesia Post Note  Patient: James Mcclain  Procedure(s) Performed:  TOTAL KNEE ARTHROPLASTY - COMPUTER ASSISTED LEFT  TOTAL KNEE REPLACEMENT. Anesthesia a combination of regional and general.  Anesthesia type: General  Patient location: PACU  Post pain: Pain level controlled and Adequate analgesia  Post assessment: Post-op Vital signs reviewed, Patient's Cardiovascular Status Stable, Respiratory Function Stable, Patent Airway and Pain level controlled  Last Vitals:  Filed Vitals:   01/24/11 0945  BP:   Pulse: 85  Temp: 36.7 C  Resp:     Post vital signs: Reviewed and stable  Level of consciousness: awake, alert  and oriented  Complications: No apparent anesthesia complications

## 2011-01-24 NOTE — Progress Notes (Signed)
Paged dr. Luiz Blare reguarding site for surgery needs to be specified on order for consent

## 2011-01-24 NOTE — Progress Notes (Signed)
repor tgiven to [employer] as caregiver

## 2011-01-24 NOTE — H&P (Signed)
  PREOPERATIVE H&P  Chief Complaint: l. Knee pain  HPI: James Mcclain is a 65 y.o. male who presents for evaluation of l. Knee pain. It has been present for >1 year. He has failed use of cane ,anti-inflamatories,activity modification, and injection therapy. X-ray shows bone on bone changes and has been worsening. He has failed conservative measures. Pain is rated as severe.  Past Medical History  Diagnosis Date  . Diabetes mellitus   . Hypertension   . Arthritis   . Cholesterol serum elevated    Past Surgical History  Procedure Date  . Kidney stones     2006  . Tonsillectomy    History   Social History  . Marital Status: Married    Spouse Name: N/A    Number of Children: N/A  . Years of Education: N/A   Social History Main Topics  . Smoking status: Not on file  . Smokeless tobacco: Not on file  . Alcohol Use: No  . Drug Use: No  . Sexually Active:    Other Topics Concern  . Not on file   Social History Narrative  . No narrative on file   No family history on file. Allergies  Allergen Reactions  . Fish Allergy Swelling   Prior to Admission medications   Medication Sig Start Date End Date Taking? Authorizing Provider  aspirin EC 81 MG tablet Take 81 mg by mouth daily.     Yes Historical Provider, MD  insulin glargine (LANTUS) 100 UNIT/ML injection Inject 30 Units into the skin at bedtime.    Yes Historical Provider, MD  olmesartan-hydrochlorothiazide (BENICAR HCT) 40-25 MG per tablet Take 1 tablet by mouth daily.     Yes Historical Provider, MD  pioglitazone (ACTOS) 45 MG tablet Take 45 mg by mouth daily.     Yes Historical Provider, MD  rosuvastatin (CRESTOR) 20 MG tablet Take 20 mg by mouth daily.     Yes Historical Provider, MD  sitaGLIPtan-metformin (JANUMET) 50-1000 MG per tablet Take 1 tablet by mouth daily.     Yes Historical Provider, MD     Positive ROS: none  All other systems have been reviewed and were otherwise negative with the exception of those  mentioned in the HPI and as above.  Physical Exam: Filed Vitals:   01/24/11 0600  BP: 161/93  Pulse: 84  Temp: 98 F (36.7 C)  Resp: 20    General: Alert, no acute distress Cardiovascular: No pedal edema Respiratory: No cyanosis, no use of accessory musculature GI: No organomegaly, abdomen is soft and non-tender Skin: No lesions in the area of chief complaint Neurologic: Sensation intact distally Psychiatric: Patient is competent for consent with normal mood and affect Lymphatic: No axillary or cervical lymphadenopathy  MUSCULOSKELETAL:  L.knee: limited ROM pain and grinding on ROM. No instability. 2+ distal pulses and nl sensation  Assessment/Plan: degenerative joint disease left knee Plan for Procedure(s): TOTAL KNEE ARTHROPLASTY with computer assistance based on young age.  The risks benefits and alternatives were discussed with the patient including but not limited to the risks of nonoperative treatment, versus surgical intervention including infection, bleeding, nerve injury, malunion, nonunion, hardware prominence, hardware failure, need for hardware removal, blood clots, cardiopulmonary complications, morbidity, mortality, among others, and they were willing to proceed.  Predicted outcome is good, although there will be at least a six to nine month expected recovery.  James Mcclain L 01/24/2011 7:15 AM

## 2011-01-24 NOTE — Preoperative (Signed)
Beta Blockers   Reason not to administer Beta Blockers:Not Applicable 

## 2011-01-25 ENCOUNTER — Encounter (HOSPITAL_COMMUNITY): Payer: Self-pay | Admitting: Orthopedic Surgery

## 2011-01-25 ENCOUNTER — Inpatient Hospital Stay (HOSPITAL_COMMUNITY): Payer: Medicare Other

## 2011-01-25 LAB — CBC
HCT: 44.2 % (ref 39.0–52.0)
Hemoglobin: 14 g/dL (ref 13.0–17.0)
MCH: 30.8 pg (ref 26.0–34.0)
MCHC: 31.7 g/dL (ref 30.0–36.0)
MCV: 97.4 fL (ref 78.0–100.0)
Platelets: 165 10*3/uL (ref 150–400)
RBC: 4.54 MIL/uL (ref 4.22–5.81)
RDW: 14.5 % (ref 11.5–15.5)
WBC: 8.5 10*3/uL (ref 4.0–10.5)

## 2011-01-25 LAB — GLUCOSE, CAPILLARY
Glucose-Capillary: 169 mg/dL — ABNORMAL HIGH (ref 70–99)
Glucose-Capillary: 191 mg/dL — ABNORMAL HIGH (ref 70–99)
Glucose-Capillary: 137 mg/dL — ABNORMAL HIGH (ref 70–99)
Glucose-Capillary: 176 mg/dL — ABNORMAL HIGH (ref 70–99)

## 2011-01-25 LAB — BASIC METABOLIC PANEL
BUN: 26 mg/dL — ABNORMAL HIGH (ref 6–23)
CO2: 32 mEq/L (ref 19–32)
Calcium: 8 mg/dL — ABNORMAL LOW (ref 8.4–10.5)
Chloride: 102 mEq/L (ref 96–112)
Creatinine, Ser: 1.97 mg/dL — ABNORMAL HIGH (ref 0.50–1.35)
GFR calc Af Amer: 40 mL/min — ABNORMAL LOW (ref >90)
GFR calc non Af Amer: 34 mL/min — ABNORMAL LOW (ref >90)
Glucose, Bld: 157 mg/dL — ABNORMAL HIGH (ref 70–99)
Potassium: 4.4 mEq/L (ref 3.5–5.1)
Sodium: 140 mEq/L (ref 135–145)

## 2011-01-25 LAB — PROTIME-INR
INR: 1.16 (ref 0.00–1.49)
Prothrombin Time: 15 seconds (ref 11.6–15.2)

## 2011-01-25 MED ORDER — WARFARIN SODIUM 7.5 MG PO TABS
7.5000 mg | ORAL_TABLET | Freq: Once | ORAL | Status: AC
  Administered 2011-01-25: 7.5 mg via ORAL
  Filled 2011-01-25: qty 1

## 2011-01-25 MED ORDER — ALPRAZOLAM 0.5 MG PO TABS: 0.5000 mg | ORAL_TABLET | Freq: Three times a day (TID) | ORAL | Status: DC | PRN

## 2011-01-25 NOTE — Progress Notes (Signed)
ANTICOAGULATION CONSULT NOTE - Follow Up Consult  Pharmacy Consult for Coumadin Indication: VTE prophylaxis  Allergies  Allergen Reactions  . Fish Allergy Swelling    Vital Signs: Temp: 99.5 F (37.5 C) (12/01 0704) Temp src: Oral (12/01 0704) BP: 104/55 mmHg (12/01 0704) Pulse Rate: 92  (12/01 0704)  Labs:  [facility] 01/25/11 0535 01/23/11 1459  HGB 14.0 17.1*  HCT 44.2 50.9  PLT 165 189  APTT -- 30  LABPROT 15.0 13.4  INR 1.16 1.00  HEPARINUNFRC -- --  CREATININE 1.97* 1.40*  CKTOTAL -- --  CKMB -- --  TROPONINI -- --   Estimated Creatinine Clearance: 49.7 ml/min (by C-G formula based on Cr of 1.97).   Medications:  Scheduled:    . acetaminophen  1,000 mg Intravenous Q6H  . ceFAZolin (ANCEF) IV  1 g Intravenous Q6H  . docusate sodium  100 mg Oral BID  . ferrous sulfate  325 mg Oral BID WC  . olmesartan  40 mg Oral Daily   And  . hydrochlorothiazide  25 mg Oral Daily  . influenza  inactive virus vaccine  0.5 mL Intramuscular Tomorrow-1000  . insulin aspart  0-15 Units Subcutaneous TID WC  . insulin glargine  20 Units Subcutaneous QHS  . linagliptin  5 mg Oral QAC breakfast  . metFORMIN  1,000 mg Oral Q breakfast  . morphine   Intravenous Q4H  . patient's guide to using coumadin book   Does not apply Once  . pioglitazone  45 mg Oral Daily  . rosuvastatin  20 mg Oral Daily  . warfarin  7.5 mg Oral ONCE-1800  . warfarin   Does not apply Once  . DISCONTD: olmesartan-hydrochlorothiazide  1 tablet Oral Daily  . DISCONTD: povidone-iodine   Topical Once  . DISCONTD: sitaGLIPtan-metformin  1 tablet Oral Daily  . DISCONTD: warfarin   Does not apply Once    Assessment: 23 YOM s/o L total knee arthroplasty,on coumadin for VTE prophylaxis. INR subtherapeutic, trending up slightly. Cbc wnl, no bleeding noted Goal of Therapy:  INR 2-3   Plan:  Coumadin 7.5mg  po x 1 F/u INR tomorrow  Riki Rusk 01/25/2011,9:09 AM

## 2011-01-25 NOTE — Progress Notes (Signed)
Physical Therapy Evaluation Patient Details Name: James Mcclain MRN: [id number] DOB: 1945-04-19 Today's Date: 01/25/2011  Problem List: There is no problem list on file for this patient.   Past Medical History:  Past Medical History  Diagnosis Date  . Diabetes mellitus   . Hypertension   . Arthritis   . Cholesterol serum elevated    Past Surgical History:  Past Surgical History  Procedure Date  . Kidney stones     2006  . Tonsillectomy     PT Assessment/Plan/Recommendation PT Assessment Clinical Impression Statement: Pt. with decreased ROM and strength of LLE following left TKA.  Today was pt.'s first time out of bed and he did very well with ambulation, ambulating 150 feet in hallway with RW.  Pt. given knee exercises to perform and stated his understanding of the importance of completing the exercises, as well as use of CPM and knee immobilizer for stability.  Also educated on avoiding resting in knee flexion, with application of heel roll at end of session.  Pt. will benefit from PT in acute setting to increase independence and decrease burden of care upon return to home.     PT Recommendation/Assessment: Patient will need skilled PT in the acute care venue PT Problem List: Decreased strength;Decreased range of motion;Decreased balance;Decreased mobility;Decreased knowledge of use of DME;Decreased knowledge of precautions;Pain Barriers to Discharge: None PT Therapy Diagnosis : Difficulty walking;Abnormality of gait;Generalized weakness;Acute pain PT Plan PT Frequency: 7X/week PT Treatment/Interventions: DME instruction;Gait training;Stair training;Therapeutic exercise;Patient/family education;Functional mobility training PT Recommendation Follow Up Recommendations: Home health PT;24 hour supervision/assistance Equipment Recommended: Rolling walker with 5" wheels PT Goals  Acute Rehab PT Goals PT Goal Formulation: With patient Time For Goal Achievement: 7 days Pt will go  Supine/Side to Sit: Independently;with HOB 0 degrees PT Goal: Supine/Side to Sit - Progress: Other (comment) Pt will go Sit to Supine/Side: Independently;with HOB 0 degrees PT Goal: Sit to Supine/Side - Progress: Other (comment) Pt will go Sit to Stand: with supervision PT Goal: Sit to Stand - Progress: Other (comment) Pt will go Stand to Sit: with supervision PT Goal: Stand to Sit - Progress: Other (comment) Pt will Ambulate: >150 feet;with supervision;with rolling walker PT Goal: Ambulate - Progress: Other (comment) Pt will Go Up / Down Stairs: 1-2 stairs;with min assist;with rolling walker PT Goal: Up/Down Stairs - Progress: Other (comment) Pt will Perform Home Exercise Program: Independently PT Goal: Perform Home Exercise Program - Progress: Other (comment)  PT Evaluation Precautions/Restrictions  Precautions Precautions: Fall;Knee Restrictions Weight Bearing Restrictions: Yes LLE Weight Bearing: Weight bearing as tolerated Prior Functioning  Home Living Lives With: Spouse Type of Home: House Home Layout: One level Home Access: Stairs to enter Entrance Stairs-Rails: None Entrance Stairs-Number of Steps: 2 Bathroom Shower/Tub: Forensic scientist: Standard Bathroom Accessibility: Yes How Accessible: Accessible via walker Home Adaptive Equipment: None Prior Function Level of Independence: Independent with basic ADLs;Independent with homemaking with ambulation;Independent with gait;Independent with transfers Driving: Yes Vocation: Retired Producer, television/film/video: Awake/alert Overall Cognitive Status: Appears within functional limits for tasks assessed Orientation Level: Oriented X4 Sensation/Coordination   Extremity Assessment RLE Assessment RLE Assessment: Within Functional Limits LLE AROM (degrees) Left Knee Extension 0-130:  (Lacking full extension roughly 10 degrees by visual estimate) Left Knee Flexion 0-140:  (Roughly 65  degrees by visual estimate) LLE Strength Left Knee Flexion:  (Grossly 3/5) Left Knee Extension: 2+/5 Mobility (including Balance) Bed Mobility Bed Mobility: Yes Supine to Sit: 6: Modified independent (Device/Increase time);HOB elevated (Comment degrees);With  rails (30 degrees) Sitting - Scoot to Edge of Bed: 5: Supervision Sitting - Scoot to Edge of Bed Details (indicate cue type and reason): For safety.  Transfers Transfers: Yes Sit to Stand: 3: Mod assist;From bed;With upper extremity assist Sit to Stand Details (indicate cue type and reason): Verbal cues for hand placement. Pt. with LOB on first attempt, mod assist for anterior translation and balance.  Stand to Sit: 4: Min assist;With armrests;To chair/3-in-1 Stand to Sit Details: Verbal cues for use of armrests and to slide left knee out.  Patient with tendency to plop.  Ambulation/Gait Ambulation/Gait: Yes Ambulation/Gait Assistance: 4: Min assist Ambulation/Gait Assistance Details (indicate cue type and reason): Cues for sequence and assist for maintenance of balance due to instability of left knee.  Ambulation Distance (Feet): 150 Feet Assistive device: Rolling walker Gait Pattern: Step-through pattern;Decreased stance time - left    Exercise  Total Joint Exercises Quad Sets: AROM;Left;10 reps;Supine Heel Slides: AROM;Left;10 reps;Supine End of Session PT - End of Session Equipment Utilized During Treatment: Gait belt Activity Tolerance: Patient tolerated treatment well Patient left: in chair;with call bell in reach Nurse Communication: Mobility status for transfers;Weight bearing status;Mobility status for ambulation General Behavior During Session: [facility] for tasks performed Cognition: [facility] for tasks performed  Sidnee Gambrill,SPT  01/25/2011, 12:10 PM

## 2011-01-25 NOTE — Progress Notes (Signed)
Pt is stating that he fell down on his knee earlier this afternoon, while a staff member was present, and got himself back into bed. This was not witnessed by any staff.  Pt was never witnessed to be on the floor.  Pt did however, lean back and "plop" into the bed when he was transferring from the chair back to the bed.  I helped him get straight in the bed.  The hemovac tubing had come apart and the IV tubing got tangled causing the IV catheter to dislodge.  I reconnected the hemovac and cleaned up what had spilled and discontinued his IV catheter.  IV team came to start a new site.  Pt seemed to be confused about what events took place.  MD notified.  Will continue to monitor.

## 2011-01-25 NOTE — Progress Notes (Signed)
Pt experiencing some confusion and memory impairment this afternoon.  Pt has been very impulsive after RN, NT and physical therapy have advised him not to get up without calling.  Pt is stating that he fell and that it was witnessed by staff.  When I came into the room to assess the situation, he stated "the other nurse was in here when I fell to the floor."  He didn't recognize RN or NT.  Pt was laying in bed very calmly, where I had last seen him.  VSS.  No acute distress.

## 2011-01-25 NOTE — Progress Notes (Signed)
Subjective: 1 Day Post-Op Procedure(s) (LRB): TOTAL KNEE ARTHROPLASTY (Left) Patient reports pain as 2 on 0-10 scale.    Objective: Vital signs in last 24 hours: Temp:  [97.2 F (36.2 C)-99.5 F (37.5 C)] 99.5 F (37.5 C) (12/01 0704) Pulse Rate:  [77-97] 92  (12/01 0704) Resp:  [3-96] 16  (12/01 0728) BP: (104-151)/(55-86) 104/55 mmHg (12/01 0704) SpO2:  [95 %-100 %] 96 % (12/01 0728) Weight:  [122.471 kg (270 lb)] 270 lb (122.471 kg) (12/01 0300)  Intake/Output from previous day: 11/30 0701 - 12/01 0700 In: 3160 [I.V.:3160] Out: 2625 [Urine:1750; Drains:775; Blood:100] Intake/Output this shift:     Basename 01/25/11 0535 01/23/11 1459  HGB 14.0 17.1*    Basename 01/25/11 0535 01/23/11 1459  WBC 8.5 5.8  RBC 4.54 5.32  HCT 44.2 50.9  PLT 165 189    Basename 01/25/11 0535 01/23/11 1459  NA 140 141  K 4.4 4.1  CL 102 101  CO2 32 28  BUN 26* 22  CREATININE 1.97* 1.40*  GLUCOSE 157* 203*  CALCIUM 8.0* 9.2    Basename 01/25/11 0535 01/23/11 1459  LABPT -- --  INR 1.16 1.00   WDWN male nad eyes not dilated - accesssory muscles respiration alert and oriented *3  L. LOWER EXT. Neurologically intact Neurovascular intact Sensation intact distally Intact pulses distally Dorsiflexion/Plantar flexion intact Incision: dressing C/D/I Compartment soft  Assessment/Plan: 1 Day Post-Op Procedure(s) (LRB): TOTAL KNEE ARTHROPLASTY (Left) with elevated BUN and CREATININE Advance diet Up with therapy Continue IV fluid for today and monitor BUN and CREATININE  James Mcclain L 01/25/2011, 7:34 AM

## 2011-01-25 NOTE — Progress Notes (Signed)
Seen and agree with SPT note Donna Silverman Tabor, PT [phone]  

## 2011-01-26 LAB — BASIC METABOLIC PANEL
BUN: 36 mg/dL — ABNORMAL HIGH (ref 6–23)
CO2: 26 mEq/L (ref 19–32)
Calcium: 7.9 mg/dL — ABNORMAL LOW (ref 8.4–10.5)
Chloride: 96 mEq/L (ref 96–112)
Creatinine, Ser: 1.82 mg/dL — ABNORMAL HIGH (ref 0.50–1.35)
GFR calc Af Amer: 44 mL/min — ABNORMAL LOW (ref >90)
GFR calc non Af Amer: 38 mL/min — ABNORMAL LOW (ref >90)
Glucose, Bld: 190 mg/dL — ABNORMAL HIGH (ref 70–99)
Potassium: 3.9 mEq/L (ref 3.5–5.1)
Sodium: 133 mEq/L — ABNORMAL LOW (ref 135–145)

## 2011-01-26 LAB — GLUCOSE, CAPILLARY
Glucose-Capillary: 192 mg/dL — ABNORMAL HIGH (ref 70–99)
Glucose-Capillary: 195 mg/dL — ABNORMAL HIGH (ref 70–99)
Glucose-Capillary: 164 mg/dL — ABNORMAL HIGH (ref 70–99)
Glucose-Capillary: 212 mg/dL — ABNORMAL HIGH (ref 70–99)

## 2011-01-26 LAB — CBC
HCT: 41 % (ref 39.0–52.0)
Hemoglobin: 13 g/dL (ref 13.0–17.0)
MCH: 30.4 pg (ref 26.0–34.0)
MCHC: 31.7 g/dL (ref 30.0–36.0)
MCV: 96 fL (ref 78.0–100.0)
Platelets: 165 10*3/uL (ref 150–400)
RBC: 4.27 MIL/uL (ref 4.22–5.81)
RDW: 14.6 % (ref 11.5–15.5)
WBC: 9.8 10*3/uL (ref 4.0–10.5)

## 2011-01-26 LAB — PROTIME-INR
INR: 1.47 (ref 0.00–1.49)
Prothrombin Time: 18.1 seconds — ABNORMAL HIGH (ref 11.6–15.2)

## 2011-01-26 MED ORDER — WARFARIN SODIUM 7.5 MG PO TABS
7.5000 mg | ORAL_TABLET | Freq: Once | ORAL | Status: AC
  Administered 2011-01-26: 7.5 mg via ORAL
  Filled 2011-01-26: qty 1

## 2011-01-26 NOTE — Progress Notes (Signed)
Subjective: 2 Days Post-Op Procedure(s) (LRB): TOTAL KNEE ARTHROPLASTY (Left)  Activity level: oob with therapy. Pt reports a fall in room sat. Diet tolerance:eating Voiding with or without catheter:voiding Patient reports pain as 3 on 0-10 scale.    Objective: Vital signs in last 24 hours: Temp:  [99.7 F (37.6 C)-101.2 F (38.4 C)] 100.5 F (38.1 C) (12/02 0520) Pulse Rate:  [101-123] 114  (12/02 0520) Resp:  [16-20] 20  (12/02 0520) BP: (120-176)/(72-79) 120/72 mmHg (12/02 0520) SpO2:  [95 %-100 %] 96 % (12/02 0520)  Intake/Output from previous day: 12/01 0701 - 12/02 0700 In: 720 [P.O.:720] Out: 700 [Urine:400; Drains:300] Intake/Output this shift: Total I/O In: 200 [P.O.:200] Out: -    Basename 01/26/11 0600 01/25/11 0535 01/23/11 1459  HGB 13.0 14.0 17.1*    Basename 01/26/11 0600 01/25/11 0535  WBC 9.8 8.5  RBC 4.27 4.54  HCT 41.0 44.2  PLT 165 165    Basename 01/26/11 0600 01/25/11 0535  NA 133* 140  K 3.9 4.4  CL 96 102  CO2 26 32  BUN 36* 26*  CREATININE 1.82* 1.97*  GLUCOSE 190* 157*  CALCIUM 7.9* 8.0*    Basename 01/26/11 0600 01/25/11 0535  LABPT -- --  INR 1.47 1.16    Neurologically intact ABD soft Neurovascular intact Sensation intact distally Intact pulses distally Dorsiflexion/Plantar flexion intact No cellulitis present Compartment soft  Dressing changed and drain pulled all wnl. Good active slr  Assessment/Plan: 2 Days Post-Op Procedure(s) (LRB): TOTAL KNEE ARTHROPLASTY (Left) Advance diet Up with therapy D/C IV fluids Plan for discharge tomorrow  Louiza Moor R 01/26/2011, 12:22 PM

## 2011-01-26 NOTE — Progress Notes (Signed)
ANTICOAGULATION CONSULT NOTE - Follow Up Consult  Pharmacy Consult for Coumadin Indication: VTE prophylaxis  Allergies  Allergen Reactions  . Fish Allergy Swelling     Vital Signs: Temp: 100.5 F (38.1 C) (12/02 0520) Temp src: Oral (12/02 0520) BP: 120/72 mmHg (12/02 0520) Pulse Rate: 114  (12/02 0520)  Labs:  Basename 01/26/11 0600 01/25/11 0535 01/23/11 1459  HGB 13.0 14.0 --  HCT 41.0 44.2 50.9  PLT 165 165 189  APTT -- -- 30  LABPROT 18.1* 15.0 13.4  INR 1.47 1.16 1.00  HEPARINUNFRC -- -- --  CREATININE 1.82* 1.97* 1.40*  CKTOTAL -- -- --  CKMB -- -- --  TROPONINI -- -- --   Estimated Creatinine Clearance: 53.8 ml/min (by C-G formula based on Cr of 1.82).  Assessment:  A65 YOM s/o L total knee arthroplasty,on coumadin for VTE prophylaxis. INR subtherapeutic, trending up. Cbc wnl, no bleeding noted   Goal of Therapy:  INR 2-3   Plan:  Coumadin 7.5mg  po x 1  F/u INR tomorrow  Riki Rusk 01/26/2011,8:53 AM

## 2011-01-26 NOTE — Progress Notes (Signed)
PHARMACIST - PHYSICIAN COMMUNICATION  CONCERNING:  METFORMIN SAFE ADMINISTRATION POLICY  RECOMMENDATION: Metformin has been placed on DISCONTINUE (rejected order) STATUS and should be reordered only after any of the conditions below are ruled out.  Current safety recommendations include avoiding metformin for a minimum of 48 hours after the patient's exposure to intravenous contrast media.  DESCRIPTION:  The Pharmacy Committee has adopted a policy that restricts the use of metformin in hospitalized patients until all the contraindications to administration have been ruled out. Specific contraindications are: [x] Serum creatinine ? 1.5 for males [] Serum creatinine ? 1.4 for females [] Shock, acute MI, sepsis, hypoxemia, dehydration [] Planned administration of intravenous iodinated contrast media [] Heart Failure patients with low EF [] Acute or chronic metabolic acidosis (including DKA)      

## 2011-01-26 NOTE — Progress Notes (Signed)
Physical Therapy Treatment Patient Details Name: James Mcclain MRN: [id number] DOB: 1945/05/12 Today's Date: 01/26/2011  PT Assessment/Plan  PT - Assessment/Plan Comments on Treatment Session: good progress with mobility PT Plan: Discharge plan remains appropriate PT Frequency: 7X/week Follow Up Recommendations: Home health PT;24 hour supervision/assistance Equipment Recommended: Rolling walker with 5" wheels;3 in 1 bedside comode PT Goals  Acute Rehab PT Goals PT Goal: Supine/Side to Sit - Progress: Progressing toward goal PT Goal: Sit to Supine/Side - Progress: Other (comment) PT Goal: Sit to Stand - Progress: Progressing toward goal PT Goal: Stand to Sit - Progress: Progressing toward goal PT Goal: Ambulate - Progress: Progressing toward goal PT Goal: Up/Down Stairs - Progress: Progressing toward goal PT Goal: Perform Home Exercise Program - Progress: Progressing toward goal  PT Treatment Precautions/Restrictions  Precautions Precautions: Fall;Knee Precaution Booklet Issued: No Required Braces or Orthoses: Yes Knee Immobilizer: On except when in CPM Restrictions Weight Bearing Restrictions: Yes LLE Weight Bearing: Weight bearing as tolerated Mobility (including Balance) Bed Mobility Supine to Sit: 6: Modified independent (Device/Increase time);HOB elevated (Comment degrees);With rails Sitting - Scoot to Edge of Bed: 5: Supervision Sitting - Scoot to [place] of Bed Details (indicate cue type and reason): bed elevated pretty high to apporximate home Transfers Sit to Stand: 4: Min assist;From bed;From chair/3-in-1 (also to and from low mat table; multiple reps) Sit to Stand Details (indicate cue type and reason): cues fro safe hand placement, and optimal pre-postitioning and to avoid "flopping"/ control descent Stand to Sit: 4: Min assist;To chair/3-in-1 (also to/from low mat table) Stand to Sit Details: cues to control descent; improving safety and level Assist  required Ambulation/Gait Ambulation/Gait Assistance: 4: Min assist (with and without physical contact) Ambulation/Gait Assistance Details (indicate cue type and reason): cues to activate quad for stability in Left stance Ambulation Distance (Feet): 200 Feet (doubled) Assistive device: Rolling walker Gait Pattern: Step-through pattern Stairs: No (therapist demonstrated correct technique for stepsbackwards)    Exercise  Total Joint Exercises Ankle Circles/Pumps: AROM;Supine;20 reps;Both Quad Sets: AROM;Left;10 reps;Supine Short Arc Quad: AAROM;Left;10 reps;Supine (progressed to AROM) Heel Slides: AAROM;10 reps;Left;Supine (overpressure stretch added) Straight Leg Raises: AAROM;Left;10 reps;Supine (progressed to AROM; very little quad lag) Knee Flexion:  (AAROM to approx 75 degrees) End of Session PT - End of Session Equipment Utilized During Treatment: Gait belt Activity Tolerance: Patient tolerated treatment well Patient left: in chair;with call bell in reach Nurse Communication: Mobility status for transfers;Weight bearing status;Mobility status for ambulation General Behavior During Session: James Mcclain for tasks performed Cognition: James Mcclain for tasks performed  James Mcclain, James Mcclain [phone]  01/26/2011, 10:06 AM

## 2011-01-27 ENCOUNTER — Encounter (HOSPITAL_COMMUNITY): Payer: Self-pay | Admitting: Orthopedic Surgery

## 2011-01-27 LAB — CBC
HCT: 38.5 % — ABNORMAL LOW (ref 39.0–52.0)
Hemoglobin: 12.5 g/dL — ABNORMAL LOW (ref 13.0–17.0)
MCH: 30.9 pg (ref 26.0–34.0)
MCHC: 32.5 g/dL (ref 30.0–36.0)
MCV: 95.3 fL (ref 78.0–100.0)
Platelets: 161 10*3/uL (ref 150–400)
RBC: 4.04 MIL/uL — ABNORMAL LOW (ref 4.22–5.81)
RDW: 14.4 % (ref 11.5–15.5)
WBC: 11.4 10*3/uL — ABNORMAL HIGH (ref 4.0–10.5)

## 2011-01-27 LAB — GLUCOSE, CAPILLARY: Glucose-Capillary: 162 mg/dL — ABNORMAL HIGH (ref 70–99)

## 2011-01-27 LAB — PROTIME-INR
INR: 1.73 — ABNORMAL HIGH (ref 0.00–1.49)
Prothrombin Time: 20.6 seconds — ABNORMAL HIGH (ref 11.6–15.2)

## 2011-01-27 MED ORDER — METHOCARBAMOL 500 MG PO TABS: 500.0000 mg | ORAL_TABLET | Freq: Four times a day (QID) | ORAL | Status: AC | PRN

## 2011-01-27 MED ORDER — WARFARIN SODIUM 7.5 MG PO TABS
7.5000 mg | ORAL_TABLET | Freq: Once | ORAL | Status: DC
  Filled 2011-01-27: qty 1

## 2011-01-27 MED ORDER — OXYCODONE-ACETAMINOPHEN 5-325 MG PO TABS: 1.0000 | ORAL_TABLET | ORAL | Status: AC | PRN

## 2011-01-27 MED ORDER — WARFARIN SODIUM 7.5 MG PO TABS: 7.5000 mg | ORAL_TABLET | Freq: Every day | ORAL | Status: DC

## 2011-01-27 NOTE — Progress Notes (Signed)
ANTICOAGULATION CONSULT NOTE - Follow Up Consult  Pharmacy Consult for Coumadin Indication: VTE prophylaxis  Allergies  Allergen Reactions  . Fish Allergy Swelling     Vital Signs: Temp: 98.9 F (37.2 C) (12/03 0703) Temp src: Oral (12/03 0703) BP: 145/79 mmHg (12/03 0703) Pulse Rate: 107  (12/03 0703)  Labs:  Basename 01/27/11 0600 01/26/11 0600 01/25/11 0535  HGB 12.5* 13.0 --  HCT 38.5* 41.0 44.2  PLT 161 165 165  APTT -- -- --  LABPROT 20.6* 18.1* 15.0  INR 1.73* 1.47 1.16  HEPARINUNFRC -- -- --  CREATININE -- 1.82* 1.97*  CKTOTAL -- -- --  CKMB -- -- --  TROPONINI -- -- --   Estimated Creatinine Clearance: 53.8 ml/min (by C-G formula based on Cr of 1.82).  Assessment:  S/p Left TKA with INR up to 1.73. Hgb 12.5. Platelets stable. Not on LMWH. No acute bleeding noted.  Goal of Therapy:  INR 1.5-2 in MD note today.  Plan:  Coumadin 7.5mg  po x 1 again today.  F/u INR tomorrow  Misty Stanley Stillinger 01/27/2011,9:29 AM

## 2011-01-27 NOTE — Progress Notes (Signed)
PATIENT ID: James Mcclain  MRN: [id number]  DOB/AGE:  27-May-1945 / 65 y.o.  3 Days Post-Op Procedure(s) (LRB): TOTAL KNEE ARTHROPLASTY (Left)    PROGRESS NOTE Subjective: Patient is alert, oriented, no Nausea, no Vomiting,  passing gas, yes Bowel Movement. Taking PO well. Denies SOB, Chest or Calf Pain. Using Incentive Spirometer, PAS in place. Ambulate ad lib , CPM advance Patient reports pain as 3 on 0-10 scale  .    Objective: Vital signs in last 24 hours: Filed Vitals:   01/26/11 0520 01/26/11 1400 01/26/11 2338 01/27/11 0703  BP: 120/72 166/80 154/85 145/79  Pulse: 114 116 114 107  Temp: 100.5 F (38.1 C) 98.8 F (37.1 C) 99.8 F (37.7 C) 98.9 F (37.2 C)  TempSrc: Oral Oral Oral Oral  Resp: 20 24 20 20   Height:      Weight:      SpO2: 96% 96% 95% 97%      Intake/Output from previous day: I/O last 3 completed shifts: In: 350 [P.O.:350] Out: 1250 [Urine:1200; Drains:50]   Intake/Output this shift:     LABORATORY DATA:  Basename 01/27/11 0625 01/27/11 0600 01/26/11 2141 01/26/11 1550 01/26/11 0600 01/25/11 0535  WBC -- 11.4* -- -- 9.8 --  HGB -- 12.5* -- -- 13.0 --  HCT -- 38.5* -- -- 41.0 --  PLT -- 161 -- -- 165 --  NA -- -- -- -- 133* 140  K -- -- -- -- 3.9 4.4  CL -- -- -- -- 96 102  CO2 -- -- -- -- 26 32  BUN -- -- -- -- 36* 26*  CREATININE -- -- -- -- 1.82* 1.97*  GLUCOSE -- -- -- -- 190* 157*  GLUCAP 162* -- 212* 164* -- --  INR -- 1.73* -- -- 1.47 --  CALCIUM -- -- -- -- 7.9* --    Examination: Neurologically intact Neurovascular intact Sensation intact distally Intact pulses distally No cellulitis present Compartment soft}  Assessment:   3 Days Post-Op Procedure(s) (LRB): TOTAL KNEE ARTHROPLASTY (Left) ADDITIONAL DIAGNOSIS:  diabetes  Plan: PT/OT WBAT, CPM 5/hrs day until ROM 0-90 degrees, then D/C CPM DVT Prophylaxis:  Lovenox\Coumadin bridge target INR 1.5-2.0 DISCHARGE PLAN: Home DISCHARGE NEEDS: HHPT and CPM     Ulysee Fyock  L 01/27/2011, 8:05 AM

## 2011-01-27 NOTE — Progress Notes (Signed)
D/C instructions reviewed with patient and wife. RX x 3 given. HH equipment at the bedside, hh services arranged with [facility]. All questions answered. Pt d/c'ed via wheelchair in stable condition

## 2011-01-27 NOTE — Progress Notes (Signed)
CARE MANAGEMENT NOTE 01/27/2011  Patient:  James Mcclain, James Mcclain   Account Number:  [id number]  Date Initiated:  01/27/2011  Documentation initiated by:  Vance Peper  Subjective/Objective Assessment:   65 yr old male s/p left total knee arthroplasty.     Action/Plan:   Discharge planning. Spoke with patient.  Choice offered. Medical modalities to deliver CPM to the Home.  Entered in TLC.   Anticipated DC Date:  01/27/2011   Anticipated DC Plan:  HOME W HOME HEALTH SERVICES      DC Planning Services  CM consult      PAC Choice  DURABLE MEDICAL EQUIPMENT  HOME HEALTH   Choice offered to / List presented to:  C-1 Patient   DME arranged  WALKER - ROLLING  3-N-1      DME agency  [facility]     HH arranged  HH-1 RN  HH-2 PT      [facility] agency  [facility]   Status of service:  Completed, signed off Discharge Disposition:  HOME W HOME HEALTH SERVICES

## 2011-01-27 NOTE — Progress Notes (Signed)
Physical Therapy Treatment Patient Details Name: James Mcclain MRN: [id number] DOB: [birth year] Today's Date: 01/27/2011  PT Assessment/Plan  PT - Assessment/Plan Comments on Treatment Session: Pt progressing well. Eager to DC home. Will have help from wife and grandson PT Plan: Discharge plan remains appropriate PT Frequency: 7X/week Follow Up Recommendations: Home health PT;24 hour supervision/assistance Equipment Recommended: Rolling walker with 5" wheels;3 in 1 bedside comode PT Goals  Acute Rehab PT Goals PT Goal: Supine/Side to Sit - Progress: Progressing toward goal PT Goal: Sit to Stand - Progress: Met PT Goal: Stand to Sit - Progress: Met PT Goal: Ambulate - Progress: Met PT Goal: Up/Down Stairs - Progress: Met PT Goal: Perform Home Exercise Program - Progress: Progressing toward goal  PT Treatment Precautions/Restrictions  Precautions Precautions: Knee Precaution Booklet Issued: No Required Braces or Orthoses: Yes Knee Immobilizer: On except when in CPM Restrictions Weight Bearing Restrictions: Yes LLE Weight Bearing: Weight bearing as tolerated Mobility (including Balance) Bed Mobility Bed Mobility: Yes Supine to Sit: 6: Modified independent (Device/Increase time) Sitting - Scoot to Edge of Bed: 6: Modified independent (Device/Increase time) Transfers Sit to Stand: 6: Modified independent (Device/Increase time);From bed;With upper extremity assist Stand to Sit: 6: Modified independent (Device/Increase time);To chair/3-in-1;With upper extremity assist Ambulation/Gait Ambulation/Gait: Yes Ambulation/Gait Assistance: 5: Supervision Ambulation/Gait Assistance Details (indicate cue type and reason): Cues for LE positioning and posture.  Ambulation Distance (Feet): 320 Feet Assistive device: Rolling walker Gait Pattern: Step-through pattern Stairs: Yes Stairs Assistance: 4: Min assist Stairs Assistance Details (indicate cue type and reason): Cues for sequency and  technique Stair Management Technique: No rails;Backwards;With walker Number of Stairs: 2  (x2)    Exercise  Total Joint Exercises Quad Sets: AROM;Strengthening;Left;15 reps;Seated Short Arc Quad: Seated;Other reps (comment);AROM;Strengthening;Left (15) Heel Slides: AROM;Strengthening;Left;15 reps;Seated Hip ABduction/ADduction: AROM;Strengthening;Left;15 reps;Seated Straight Leg Raises: AAROM;Strengthening;Left;15 reps;Seated End of Session PT - End of Session Equipment Utilized During Treatment: Gait belt Activity Tolerance: Patient tolerated treatment well Patient left: in chair;with call bell in reach Nurse Communication: Mobility status for transfers;Mobility status for ambulation General Behavior During Session: [facility] for tasks performed Cognition: [facility] for tasks performed  Robinette, Adline Potter 01/27/2011, 11:57 AM

## 2011-01-28 LAB — GLUCOSE, CAPILLARY: Glucose-Capillary: 176 mg/dL — ABNORMAL HIGH (ref 70–99)

## 2011-01-29 NOTE — Discharge Summary (Signed)
Patient ID: DALYN BECKER MRN: [id number] DOB/AGE: September 23, 1945 65 y.o.  Admit date: 01/24/2011 Discharge date: 01/27/11  Admission Diagnoses:  DJD Left knee  Discharge Diagnoses:  Same  Past Medical History  Diagnosis Date  . Diabetes mellitus   . Hypertension   . Arthritis   . Cholesterol serum elevated     Surgeries: Procedure(s):Left TOTAL KNEE ARTHROPLASTY on 01/24/2011   Consultants:  none  Discharged Condition: Improved  Hospital Course: KENTON FORTIN is an 65 y.o. male who was admitted 01/24/2011 for operative treatment of left knee osteoarthritis/pain. Patient has severe unremitting pain that affects sleep, daily activities, and work/hobbies. After pre-op clearance the patient was taken to the operating room on 01/24/2011 and underwent  Procedure(s):Left TOTAL KNEE ARTHROPLASTY.    Patient was given perioperative antibiotics: Anti-infectives     Start     Dose/Rate Route Frequency Ordered Stop   01/24/11 1030   ceFAZolin (ANCEF) IVPB 1 g/50 mL premix        1 g 100 mL/hr over 30 Minutes Intravenous Every 6 hours 01/24/11 1026 01/24/11 2251   01/24/11 0753   cefUROXime (ZINACEF) injection  Status:  Discontinued          As needed 01/24/11 0753 01/24/11 0953   01/23/11 1521   ceFAZolin (ANCEF) IVPB 2 g/50 mL premix        2 g 100 mL/hr over 30 Minutes Intravenous 60 min pre-op 01/23/11 1521 01/24/11 0735           Patient was given sequential compression devices, early ambulation, and chemoprophylaxis to prevent DVT.  Patient benefited maximally from hospital stay and there were no complications.    Recent vital signs:    Recent laboratory studies:  Basename 01/27/11 0600  WBC 11.4*  HGB 12.5*  HCT 38.5*  PLT 161  NA --  K --  CL --  CO2 --  BUN --  CREATININE --  GLUCOSE --  INR 1.73*  CALCIUM --     Discharge Medications:  Discharge Medication List as of 01/27/2011 11:52 AM    START taking these medications   Details  methocarbamol  (ROBAXIN) 500 MG tablet Take 1 tablet (500 mg total) by mouth every 6 (six) hours as needed., Starting 01/27/2011, Until Thu 02/06/11, Print    oxyCODONE-acetaminophen (ROXICET) 5-325 MG per tablet Take 1-2 tablets by mouth every 4 (four) hours as needed for pain., Starting 01/27/2011, Until Thu 02/06/11, Print    warfarin (COUMADIN) 7.5 MG tablet Take 1 tablet (7.5 mg total) by mouth daily., Starting 01/27/2011, Until Tue 01/27/12, Print      CONTINUE these medications which have NOT CHANGED   Details  insulin glargine (LANTUS) 100 UNIT/ML injection Inject 30 Units into the skin at bedtime. , Until Discontinued, Historical Med    olmesartan-hydrochlorothiazide (BENICAR HCT) 40-25 MG per tablet Take 1 tablet by mouth daily.  , Until Discontinued, Historical Med    pioglitazone (ACTOS) 45 MG tablet Take 45 mg by mouth daily.  , Until Discontinued, Historical Med    rosuvastatin (CRESTOR) 20 MG tablet Take 20 mg by mouth daily.  , Until Discontinued, Historical Med    sitaGLIPtan-metformin (JANUMET) 50-1000 MG per tablet Take 1 tablet by mouth daily.  , Until Discontinued, Historical Med      STOP taking these medications     aspirin EC 81 MG tablet         Diagnostic Studies: Dg Chest 2 View  01/23/2011  *RADIOLOGY REPORT*  Clinical  Data: Preop.  CHEST - 2 VIEW  Comparison: 06/23/2008  Findings: There is hyperinflation of the lungs compatible with COPD.  Heart is upper limits normal in size.  Tortuosity and ectasia of the thoracic aorta.  No acute bony abnormality or effusions.  No focal airspace opacities.  IMPRESSION: Mild COPD.  No active disease.  Original Report Authenticated By: Cyndie Chime, M.D.   Dg Knee 1-2 Views Left  01/25/2011  *RADIOLOGY REPORT*  Clinical Data: Postop.  LEFT KNEE - 1-2 VIEW  Comparison: None.  Findings: The patient is status post left total knee replacement. Surgical drain is in good position.  The femoral and tibial components appear appropriately  positioned.  Overlying surgical staples. There are small defects in the distal femur and proximal tibia which appear to be screw tracts.  IMPRESSION: Satisfactory appearance status post left TKR.  Original Report Authenticated By: Elsie Stain, M.D.    Disposition: Home-Health Care Svc  Discharge Orders    Future Orders Please Complete By Expires   Diet - low sodium heart healthy      Call MD / Call 911      Comments:   If you experience chest pain or shortness of breath, CALL 911 and be transported to the hospital emergency room.  If you develope a fever above 101 F, pus (white drainage) or increased drainage or redness at the wound, or calf pain, call your surgeon's office.   Constipation Prevention      Comments:   Drink plenty of fluids.  Prune juice may be helpful.  You may use a stool softener, such as Colace (over the counter) 100 mg twice a day.  Use MiraLax (over the counter) for constipation as needed.   Increase activity slowly as tolerated      Weight Bearing as taught in Physical Therapy      Comments:   Use a walker or crutches as instructed.   Driving restrictions      Comments:   No driving for 3 weeks   CPM      Comments:   Continuous passive motion machine (CPM):      Use the CPM from  to  for up to 8 hours per day.      You may increase by 10 degrees per day.  You may break it up into 2 or 3 sessions per day.      Use CPM for  until you are told to stop.   TED hose      Comments:   Use stockings (TED hose) for 3 weeks on both leg(s).  You may remove them at night for sleeping.   Change dressing      Comments:   Change dressing on l knee, then change the dressing daily with sterile 4 x 4 inch gauze dressing and apply TED hose.  You may clean the incision with alcohol prior to redressing.   Do not put a pillow under the knee. Place it under the heel.         Follow-up Information    Follow up with GRAVES,JOHN L. Call in 2 weeks.   Contact information:   [address] [place] [address] [phone]           Signed: Matthew Folks 01/29/2011, 1:15 PM

## 2012-05-09 ENCOUNTER — Encounter: Payer: Self-pay | Admitting: *Deleted

## 2012-05-13 ENCOUNTER — Telehealth: Payer: Self-pay | Admitting: Family Medicine

## 2012-05-13 MED ORDER — LOSARTAN POTASSIUM-HCTZ 100-25 MG PO TABS: 1.0000 | ORAL_TABLET | Freq: Every day | ORAL | Status: DC

## 2012-05-13 NOTE — Telephone Encounter (Signed)
Done

## 2012-05-20 ENCOUNTER — Ambulatory Visit (INDEPENDENT_AMBULATORY_CARE_PROVIDER_SITE_OTHER): Payer: Medicare Other | Admitting: Family Medicine

## 2012-05-20 ENCOUNTER — Encounter: Payer: Self-pay | Admitting: Family Medicine

## 2012-05-20 VITALS — BP 110/78 | HR 88 | Temp 98.4°F | Resp 16 | Wt 281.0 lb

## 2012-05-20 DIAGNOSIS — E119 Type 2 diabetes mellitus without complications: Secondary | ICD-10-CM | POA: Insufficient documentation

## 2012-05-20 DIAGNOSIS — R972 Elevated prostate specific antigen [PSA]: Secondary | ICD-10-CM | POA: Insufficient documentation

## 2012-05-20 DIAGNOSIS — IMO0001 Reserved for inherently not codable concepts without codable children: Secondary | ICD-10-CM

## 2012-05-20 DIAGNOSIS — M109 Gout, unspecified: Secondary | ICD-10-CM

## 2012-05-20 DIAGNOSIS — E785 Hyperlipidemia, unspecified: Secondary | ICD-10-CM

## 2012-05-20 DIAGNOSIS — R5381 Other malaise: Secondary | ICD-10-CM

## 2012-05-20 DIAGNOSIS — R5383 Other fatigue: Secondary | ICD-10-CM

## 2012-05-20 LAB — CBC WITH DIFFERENTIAL/PLATELET
Basophils Absolute: 0 10*3/uL (ref 0.0–0.1)
Basophils Relative: 0 % (ref 0–1)
Eosinophils Absolute: 0.2 10*3/uL (ref 0.0–0.7)
Eosinophils Relative: 4 % (ref 0–5)
HCT: 51.2 % (ref 39.0–52.0)
Hemoglobin: 17.2 g/dL — ABNORMAL HIGH (ref 13.0–17.0)
Lymphocytes Relative: 35 % (ref 12–46)
Lymphs Abs: 2 10*3/uL (ref 0.7–4.0)
MCH: 31 pg (ref 26.0–34.0)
MCHC: 33.6 g/dL (ref 30.0–36.0)
MCV: 92.4 fL (ref 78.0–100.0)
Monocytes Absolute: 0.7 10*3/uL (ref 0.1–1.0)
Monocytes Relative: 11 % (ref 3–12)
Neutro Abs: 2.8 10*3/uL (ref 1.7–7.7)
Neutrophils Relative %: 50 % (ref 43–77)
Platelets: 204 10*3/uL (ref 150–400)
RBC: 5.54 MIL/uL (ref 4.22–5.81)
RDW: 14.3 % (ref 11.5–15.5)
WBC: 5.7 10*3/uL (ref 4.0–10.5)

## 2012-05-20 LAB — LIPID PANEL
Cholesterol: 213 mg/dL — ABNORMAL HIGH (ref 0–200)
HDL: 37 mg/dL — ABNORMAL LOW (ref >39)
LDL Cholesterol: 154 mg/dL — ABNORMAL HIGH (ref 0–99)
Total CHOL/HDL Ratio: 5.8 Ratio
Triglycerides: 111 mg/dL (ref <150)
VLDL: 22 mg/dL (ref 0–40)

## 2012-05-20 LAB — HEMOGLOBIN A1C
Hgb A1c MFr Bld: 7.2 % — ABNORMAL HIGH (ref <5.7)
Mean Plasma Glucose: 160 mg/dL — ABNORMAL HIGH (ref <117)

## 2012-05-20 LAB — BASIC METABOLIC PANEL
BUN: 25 mg/dL — ABNORMAL HIGH (ref 6–23)
CO2: 31 mEq/L (ref 19–32)
Calcium: 9.5 mg/dL (ref 8.4–10.5)
Chloride: 103 mEq/L (ref 96–112)
Creat: 1.47 mg/dL — ABNORMAL HIGH (ref 0.50–1.35)
Glucose, Bld: 116 mg/dL — ABNORMAL HIGH (ref 70–99)
Potassium: 4.5 mEq/L (ref 3.5–5.3)
Sodium: 142 mEq/L (ref 135–145)

## 2012-05-20 LAB — HEPATIC FUNCTION PANEL
ALT: 15 U/L (ref 0–53)
AST: 16 U/L (ref 0–37)
Albumin: 4.2 g/dL (ref 3.5–5.2)
Alkaline Phosphatase: 58 U/L (ref 39–117)
Bilirubin, Direct: 0.1 mg/dL (ref 0.0–0.3)
Indirect Bilirubin: 0.6 mg/dL (ref 0.0–0.9)
Total Bilirubin: 0.7 mg/dL (ref 0.3–1.2)
Total Protein: 7.1 g/dL (ref 6.0–8.3)

## 2012-05-20 LAB — URIC ACID: Uric Acid, Serum: 8.4 mg/dL — ABNORMAL HIGH (ref 4.0–7.8)

## 2012-05-20 LAB — TESTOSTERONE: Testosterone: 338 ng/dL (ref 300–890)

## 2012-05-20 LAB — TSH: TSH: 0.785 u[IU]/mL (ref 0.350–4.500)

## 2012-05-20 NOTE — Progress Notes (Signed)
Subjective:     Patient ID: James Mcclain, male   DOB: [birth year], 67 y.o.   MRN: [id number]  HPI Problem #1 insulin-dependent diabetes. The patient is currently on Lantus 33 units subcutaneous each bedtime. He is also taking Actos 45 mg by mouth daily. He uses [place] on a when necessary basis.  He reports fasting blood sugars 80-110 and two-hour postprandial sugars under 150. He is reporting that when he exercises he becomes profoundly weak, tremulous, and dizzy usually after walking for 30 minutes.  During the spells he becomes profoundly fatigued. He denies any shortness of breath. He denies any chest pain. He denies any angina.  He denies any neuropathy in the feet.  #2 is hypertension. The patient's current on Hyzaar 100/25 by mouth daily.  Denies any chest pain shortness of breath or dyspnea on exertion.  #3 is hyperlipidemia-  he is currently on Crestor 20 mg by mouth daily. He denies any myalgias or right quadrant pain.  #4 is gout. The patient complains of daily aching pain in the plantar aspect of the first MTP joint bilaterally. He denies any numbness burning or dysesthesias.  Currently on no preventative medicine for gout. Past Medical History  Diagnosis Date  . Arthritis   . Diabetes mellitus   . Cholesterol serum elevated   . Hypertension   . Elevated PSA    Current Outpatient Prescriptions on File Prior to Visit  Medication Sig Dispense Refill  . amLODipine (NORVASC) 10 MG tablet Take 10 mg by mouth daily.      . insulin glargine (LANTUS) 100 UNIT/ML injection Inject 33 Units into the skin at bedtime.       [name] losartan-hydrochlorothiazide (HYZAAR) 100-25 MG per tablet Take 1 tablet by mouth daily.  30 tablet  3  . pioglitazone (ACTOS) 45 MG tablet Take 45 mg by mouth daily.        . rosuvastatin (CRESTOR) 20 MG tablet Take 20 mg by mouth daily.        . sitaGLIPtan-metformin (JANUMET) 50-1000 MG per tablet Take 1 tablet by mouth as needed.       . zinc gluconate 50 MG tablet  Take 50 mg by mouth daily.      . Vitamin D, Ergocalciferol, (DRISDOL) 50000 UNITS CAPS Take 8,000 Units by mouth daily.        No current facility-administered medications on file prior to visit.   History   Social History  . Marital Status: Married    Spouse Name: N/A    Number of Children: N/A  . Years of Education: N/A   Occupational History  . Not on file.   Social History Main Topics  . Smoking status: Never Smoker   . Smokeless tobacco: Never Used  . Alcohol Use: No  . Drug Use: No  . Sexually Active: Not on file   Other Topics Concern  . Not on file   Social History Narrative  . No narrative on file    Review of Systems Review of systems is otherwise negative    Objective:   Physical Exam  Constitutional: He is oriented to person, place, and time. He appears well-developed and well-nourished.  HENT:  Head: Normocephalic and atraumatic.  Right Ear: External ear normal.  Left Ear: External ear normal.  Eyes: Conjunctivae and EOM are normal. Pupils are equal, round, and reactive to light.  Neck: Normal range of motion. Neck supple. No JVD present. No thyromegaly present.  Cardiovascular: Normal rate, regular rhythm and  normal heart sounds.  Exam reveals no friction rub.   No murmur heard. Pulmonary/Chest: Effort normal and breath sounds normal. No respiratory distress. He has no wheezes. He has no rales.  Abdominal: Soft. Bowel sounds are normal. He exhibits no distension. There is no tenderness. There is no rebound.  Musculoskeletal: Normal range of motion.  Lymphadenopathy:    He has no cervical adenopathy.  Neurological: He is alert and oriented to person, place, and time.  Skin: Skin is warm and dry. He is not diaphoretic.   exam reveals normal sensation to 10 mg monofilament bilaterally. He also has normal sensation to cold as well as vibration. He has two over four dorsalis pedis pulses bilaterally. He has no ulcers or sores on feet.     Assessment:      Diabetes mellitus type 2 insulin-dependent without complication Hypertension Hyperlipidemia Fatigue Gout    Plan:    Type II or unspecified type diabetes mellitus without mention of complication, uncontrolled - Plan: Hemoglobin A1c  Other malaise and fatigue - Plan: Basic Metabolic Panel, Hepatic Function Panel, TSH, CBC with Differential, Testosterone  Other and unspecified hyperlipidemia - Plan: Lipid Panel  Gout - Plan: Uric Acid  I believe the spells he is experiencing are hypoglycemic events after exercise. We'll check an A1c. If A1c is at goal, I have him discontinue Gloriann Loan altogether. Also talked about 3 medicating prior to exercise with cheese crackers and a small glass of orange juice.  If the spells prove to be something other than hypoglycemia, the patient would benefit from having a cardiac stress test.  Blood pressure is excellent.  No change in therapy.  His goal LDL is less than 100. I will titrate Crestor to achieve that goal.  I believe the pain in his foot is due to gout. His uric acid level is greater than 6, we'll likely initiate allopurinol.

## 2012-05-21 ENCOUNTER — Telehealth: Payer: Self-pay | Admitting: Family Medicine

## 2012-05-21 MED ORDER — ALLOPURINOL 100 MG PO TABS: 200.0000 mg | ORAL_TABLET | Freq: Every day | ORAL | Status: DC

## 2012-05-21 NOTE — Addendum Note (Signed)
Addended by: Lynnea Ferrier T on: 05/21/2012 07:19 AM   Modules accepted: Orders

## 2012-05-21 NOTE — Telephone Encounter (Signed)
rx refilled at [facility]

## 2012-06-07 ENCOUNTER — Other Ambulatory Visit: Payer: Self-pay | Admitting: Family Medicine

## 2012-06-07 ENCOUNTER — Telehealth: Payer: Self-pay | Admitting: Family Medicine

## 2012-06-07 DIAGNOSIS — R0609 Other forms of dyspnea: Secondary | ICD-10-CM

## 2012-06-07 DIAGNOSIS — R0600 Dyspnea, unspecified: Secondary | ICD-10-CM

## 2012-06-07 DIAGNOSIS — IMO0001 Reserved for inherently not codable concepts without codable children: Secondary | ICD-10-CM

## 2012-06-07 NOTE — Telephone Encounter (Signed)
Pt aware that we are setting this up for him and we will call him when it is scheduled.

## 2012-06-07 NOTE — Telephone Encounter (Signed)
i have placed referral.,

## 2012-06-22 ENCOUNTER — Ambulatory Visit (INDEPENDENT_AMBULATORY_CARE_PROVIDER_SITE_OTHER): Payer: Medicare Other | Admitting: Cardiology

## 2012-06-22 ENCOUNTER — Encounter: Payer: Self-pay | Admitting: Cardiology

## 2012-06-22 ENCOUNTER — Ambulatory Visit: Payer: Medicare Other | Admitting: Cardiology

## 2012-06-22 VITALS — BP 134/100 | HR 88 | Ht 69.5 in | Wt 275.0 lb

## 2012-06-22 DIAGNOSIS — R0600 Dyspnea, unspecified: Secondary | ICD-10-CM

## 2012-06-22 DIAGNOSIS — R0609 Other forms of dyspnea: Secondary | ICD-10-CM

## 2012-06-22 DIAGNOSIS — R011 Cardiac murmur, unspecified: Secondary | ICD-10-CM

## 2012-06-22 DIAGNOSIS — R0989 Other specified symptoms and signs involving the circulatory and respiratory systems: Secondary | ICD-10-CM

## 2012-06-22 DIAGNOSIS — E7800 Pure hypercholesterolemia, unspecified: Secondary | ICD-10-CM

## 2012-06-22 DIAGNOSIS — E789 Disorder of lipoprotein metabolism, unspecified: Secondary | ICD-10-CM

## 2012-06-22 DIAGNOSIS — I10 Essential (primary) hypertension: Secondary | ICD-10-CM

## 2012-06-22 MED ORDER — CARVEDILOL 6.25 MG PO TABS: 6.2500 mg | ORAL_TABLET | Freq: Two times a day (BID) | ORAL | Status: DC

## 2012-06-22 MED ORDER — ATORVASTATIN CALCIUM 40 MG PO TABS: 40.0000 mg | ORAL_TABLET | Freq: Every day | ORAL | Status: DC

## 2012-06-22 NOTE — Patient Instructions (Addendum)
Start coreg (carvedilol) 6.25mg  two times a day.   Start atrovastatin (lipitor) 40mg  daily.   Your physician has requested that you have an echocardiogram. Echocardiography is a painless test that uses sound waves to create images of your heart. It provides your doctor with information about the size and shape of your heart and how well your heart's chambers and valves are working. This procedure takes approximately one hour. There are no restrictions for this procedure.  Your physician has requested that you have en exercise stress myoview. For further information please visit [email or web address] Please follow instruction sheet, as given.  Your physician recommends that you schedule a follow-up appointment in: 2 weeks with Dr Shirlee Latch. This is scheduled for Monday May 12,2014 at 4:15PM. Be sure to have your testing completed before you see Dr Shirlee Latch.   Your physician recommends that you return for a FASTING lipid profile /liver profile in 2 months.

## 2012-06-23 DIAGNOSIS — R0600 Dyspnea, unspecified: Secondary | ICD-10-CM | POA: Insufficient documentation

## 2012-06-23 DIAGNOSIS — R011 Cardiac murmur, unspecified: Secondary | ICD-10-CM | POA: Insufficient documentation

## 2012-06-23 NOTE — Progress Notes (Signed)
Patient ID: James Mcclain, male   DOB: Feb 22, 1946, 67 y.o.   MRN: [id number] PCP: Dr. Tanya Nones  67 yo with history of type II diabetes, HTN, CKD presents for evaluation of exertional dyspnea.  For the last two months or so, patient has noted increased fatigue as well as dyspnea with exertion.  He gets very fatigued [place] and has to stop frequently to catch his breath.  He gets very tired and short of breath walking up stairs.  This is all new, he used to be able to walk 1 mile and ride and exercise bike at the [facility] with no problem.  No chest pain.  He does not feel that the symptoms are due to hypoglycemia.  In the past, with hypoglycemia he has gotten shaky and lightheaded.  Current exertional symptoms are just dyspnea and fatigue.   BP also has been running high.   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 HTN 2. Type II diabetes 3. Gout 4. OA s/p TKR 5. CKD: Suspect diabetic nephropathy 6. Hyperlipidemia  SH: Married, retired Midwife, lives in [place].  Nonsmoker.  FH: Brother with CHF, father with "enlarged heart"  ROS: All systems reviewed and negative except as per HPI.   Current Outpatient Prescriptions  Medication Sig Dispense Refill  . allopurinol (ZYLOPRIM) 100 MG tablet Take 2 tablets (200 mg total) by mouth daily.  30 tablet  6  . amLODipine (NORVASC) 10 MG tablet Take 10 mg by mouth daily.      . Cholecalciferol (VITAMIN D3) 5000 UNITS CAPS Take 10,000 Units by mouth daily.      . insulin glargine (LANTUS) 100 UNIT/ML injection Inject 33 Units into the skin at bedtime.       [name] losartan-hydrochlorothiazide (HYZAAR) 100-25 MG per tablet Take 1 tablet by mouth daily.  30 tablet  3  . pioglitazone (ACTOS) 45 MG tablet Take 45 mg by mouth daily.        [name] zinc gluconate 50 MG tablet Take 100 mg by mouth daily.       [name] aspirin EC 81 MG tablet Take 1 tablet (81 mg total) by  mouth daily.      [name] atorvastatin (LIPITOR) 40 MG tablet Take 1 tablet (40 mg total) by mouth daily.  30 tablet  3  . carvedilol (COREG) 6.25 MG tablet Take 1 tablet (6.25 mg total) by mouth 2 (two) times daily.  60 tablet  6   No current facility-administered medications for this visit.   BP 134/100  Pulse 88  Ht 5' 9.5" (1.765 m)  Wt 275 lb (124.739 kg)  BMI 40.04 kg/m2 General: NAD Neck: No JVD, no thyromegaly or thyroid nodule.  Lungs: Clear to auscultation bilaterally with normal respiratory effort. CV: Nondisplaced PMI.  Heart regular S1/S2, +S4, 2/6 early SEM RUSB.  No peripheral edema.  No carotid bruit.  Normal pedal pulses.  Abdomen: Soft, nontender, no hepatosplenomegaly, no distention.  Skin: Intact without lesions or rashes.  Neurologic: Alert and oriented x 3.  Psych: Normal affect. Extremities: No clubbing or cyanosis.  HEENT: Normal.   Assessment/Plan: 1. Exertional dyspnea/fatigue: Patient has noted a clear change over the last couple of months.  Given his history of HTN, DM, and hyperlipidemia, the exertional symptoms are concerning for possible coronary disease.  He also has inferior Qs on his ECG.  He has  not, however, had chest pain.   - Continue ASA - I will arrange for an ETT-Sestamibi for risk stratification.  2. HTN: BP has been running high.  I will add Coreg 6.25 mg bid to his regimen.  He is already on good doses of amlodipine and losartan/HCT.  3. Hyperlipidemia: LDL is high.  He has not been taking Crestor due to expense.  I will have him start on atorvastatin 40 mg daily.  Lipids/LFTs in 2 months.   4. Murmur: Aortic area systolic murmur.  I will get an echocardiogram.    [name] 06/23/2012

## 2012-06-25 ENCOUNTER — Ambulatory Visit (HOSPITAL_COMMUNITY): Payer: Medicare Other | Attending: Cardiology | Admitting: Radiology

## 2012-06-25 DIAGNOSIS — R0989 Other specified symptoms and signs involving the circulatory and respiratory systems: Secondary | ICD-10-CM

## 2012-06-25 DIAGNOSIS — R011 Cardiac murmur, unspecified: Secondary | ICD-10-CM

## 2012-06-25 DIAGNOSIS — I10 Essential (primary) hypertension: Secondary | ICD-10-CM

## 2012-06-25 DIAGNOSIS — R0609 Other forms of dyspnea: Secondary | ICD-10-CM | POA: Insufficient documentation

## 2012-06-25 DIAGNOSIS — E7800 Pure hypercholesterolemia, unspecified: Secondary | ICD-10-CM

## 2012-06-25 NOTE — Progress Notes (Signed)
Echocardiogram performed.  

## 2012-06-29 ENCOUNTER — Ambulatory Visit (HOSPITAL_COMMUNITY): Payer: Medicare Other | Attending: Cardiovascular Disease | Admitting: Radiology

## 2012-06-29 VITALS — BP 148/86 | Ht 69.0 in | Wt 278.0 lb

## 2012-06-29 DIAGNOSIS — Z8249 Family history of ischemic heart disease and other diseases of the circulatory system: Secondary | ICD-10-CM | POA: Insufficient documentation

## 2012-06-29 DIAGNOSIS — R011 Cardiac murmur, unspecified: Secondary | ICD-10-CM

## 2012-06-29 DIAGNOSIS — I10 Essential (primary) hypertension: Secondary | ICD-10-CM | POA: Insufficient documentation

## 2012-06-29 DIAGNOSIS — R42 Dizziness and giddiness: Secondary | ICD-10-CM | POA: Insufficient documentation

## 2012-06-29 DIAGNOSIS — R9431 Abnormal electrocardiogram [ECG] [EKG]: Secondary | ICD-10-CM | POA: Insufficient documentation

## 2012-06-29 DIAGNOSIS — R5381 Other malaise: Secondary | ICD-10-CM | POA: Insufficient documentation

## 2012-06-29 DIAGNOSIS — E119 Type 2 diabetes mellitus without complications: Secondary | ICD-10-CM | POA: Insufficient documentation

## 2012-06-29 DIAGNOSIS — Z794 Long term (current) use of insulin: Secondary | ICD-10-CM | POA: Insufficient documentation

## 2012-06-29 DIAGNOSIS — E7800 Pure hypercholesterolemia, unspecified: Secondary | ICD-10-CM

## 2012-06-29 DIAGNOSIS — R0609 Other forms of dyspnea: Secondary | ICD-10-CM | POA: Insufficient documentation

## 2012-06-29 DIAGNOSIS — R0600 Dyspnea, unspecified: Secondary | ICD-10-CM

## 2012-06-29 DIAGNOSIS — R0602 Shortness of breath: Secondary | ICD-10-CM

## 2012-06-29 DIAGNOSIS — R079 Chest pain, unspecified: Secondary | ICD-10-CM

## 2012-06-29 DIAGNOSIS — E785 Hyperlipidemia, unspecified: Secondary | ICD-10-CM | POA: Insufficient documentation

## 2012-06-29 DIAGNOSIS — R5383 Other fatigue: Secondary | ICD-10-CM | POA: Insufficient documentation

## 2012-06-29 DIAGNOSIS — R0989 Other specified symptoms and signs involving the circulatory and respiratory systems: Secondary | ICD-10-CM | POA: Insufficient documentation

## 2012-06-29 MED ORDER — TECHNETIUM TC 99M SESTAMIBI GENERIC - CARDIOLITE
30.0000 | Freq: Once | INTRAVENOUS | Status: AC | PRN
  Administered 2012-06-29: 30 via INTRAVENOUS

## 2012-06-29 MED ORDER — TECHNETIUM TC 99M SESTAMIBI GENERIC - CARDIOLITE
10.0000 | Freq: Once | INTRAVENOUS | Status: AC | PRN
  Administered 2012-06-29: 10 via INTRAVENOUS

## 2012-06-29 NOTE — Progress Notes (Addendum)
[facility] SITE 3 NUCLEAR MED [address] [place], [place] [address] [phone]    Cardiology Nuclear Med Study  James Mcclain is a 67 y.o. male     MRN : [id number]     DOB: 1945-08-14  Procedure Date: 06/29/2012  Nuclear Med Background Indication for Stress Test:  Evaluation for Ischemia and Abnormal EKG History:  No prior known history of CAD, 20 years ago GXT: Normal per patient, 6-7 years ago Cath @ [facility] Normal per patient Cardiac Risk Factors: Family History - CAD, Hypertension, IDDM Type 2 and Lipids  Symptoms:  DOE, Fatigue, Fatigue with Exertion and Light-Headedness   Nuclear Pre-Procedure Caffeine/Decaff Intake:  None > 12 hrs NPO After: 8:00am   Lungs:  clear O2 Sat: 97% on room air. IV 0.9% NS with Angio Cath:  22g  IV Site: R Antecubital x 1, tolerated well IV Started by:  Irean Hong, RN  Chest Size (in):  48 Cup Size: n/a  Height: 5\' 9"  (1.753 m)  Weight:  278 lb (126.1 kg)  BMI:  Body mass index is 41.03 kg/(m^2). Tech Comments:  Last dose Coreg 5:00 pm yesterday. 1/2 dose Lantus insulin last night; no insulin today. Irean Hong, RN.     Nuclear Med Study 1 or 2 day study: 1 day  Stress Test Type:  Stress  Reading MD: Charlton Haws, MD  Order Authorizing Provider:  [name], MD  Resting Radionuclide: Technetium 75m Sestamibi  Resting Radionuclide Dose: 11.0 mCi   Stress Radionuclide:  Technetium 41m Sestamibi  Stress Radionuclide Dose: 33.0 mCi           Stress Protocol Rest HR: 73 Stress HR: 144  Rest BP: 148/86 Stress BP: 227/115  Exercise Time (min): 4:01 METS: 5.4   Predicted Max HR: 155 bpm % Max HR: 92.9 bpm Rate Pressure Product: 27253   Dose of Adenosine (mg):  n/a Dose of Lexiscan: n/a mg  Dose of Atropine (mg): n/a Dose of Dobutamine: n/a mcg/kg/min (at max HR)  Stress Test Technologist: Irean Hong, RN  Nuclear Technologist:  Domenic Polite, CNMT     Rest Procedure:  Myocardial perfusion imaging was performed at  rest 45 minutes following the intravenous administration of Technetium 18m Sestamibi. Rest ECG: NSR poor R wave progression PVC;s  Stress Procedure:  The patient exercised on the treadmill utilizing the Bruce Protocol for 4:01 minutes, RPE=15.  The patient stopped due to DOE, fatigue, and denied any chest pain. There was a marked hypertensive response to exercise. The patient was given his coreg 6.25mg  po in recovery.Technetium 41m Sestamibi was injected at peak exercise and myocardial perfusion imaging was performed after a brief delay. Stress ECG: No significant change from baseline ECG  QPS Raw Data Images:  Patient motion noted. Stress Images:  There is decreased uptake in the inferior wall. Rest Images:  There is decreased uptake in the inferior wall. Subtraction (SDS):  SDS 2 abnormal at apex Transient Ischemic Dilatation (Normal <1.22):  0.93 Lung/Heart Ratio (Normal <0.45):  0.31  Quantitative Gated Spect Images QGS EDV:  126 ml QGS ESV:  55 ml  Impression Exercise Capacity:  Poor exercise capacity. BP Response:  Hypertensive blood pressure response. Clinical Symptoms:  There is dyspnea. ECG Impression:  No significant ST segment change suggestive of ischemia. Comparison with Prior Nuclear Study: No previous nuclear study performed  Overall Impression:  Low risk stress nuclear study Poor exercise tolerance with severe elevation in BP and audible wheezing.  Inferior wall  thinning at mid and basal levels not thought to be significant.  LV Ejection Fraction: 56%.  LV Wall Motion:  NL LV Function; NL Wall Motion  Charlton Haws  Poor exercise tolerance.  Inferior wall defect on images, primarily fixed.  Had inferior Qs on ECG.  Poor exercise tolerance.  Concerned he could have RCA stenosis.  Would probably recommend cath.  Would like to see him back in office to discuss.   [name] 06/30/2012

## 2012-06-30 NOTE — Progress Notes (Signed)
Pt aware of results and appt with Dr Shirlee Latch 07/05/12.

## 2012-07-05 ENCOUNTER — Ambulatory Visit: Payer: Medicare Other | Admitting: Cardiology

## 2012-07-15 DIAGNOSIS — C61 Malignant neoplasm of prostate: Secondary | ICD-10-CM

## 2012-07-15 HISTORY — DX: Malignant neoplasm of prostate: C61

## 2012-07-29 ENCOUNTER — Encounter: Payer: Self-pay | Admitting: Cardiology

## 2012-07-29 ENCOUNTER — Encounter: Payer: Self-pay | Admitting: *Deleted

## 2012-07-29 ENCOUNTER — Ambulatory Visit (INDEPENDENT_AMBULATORY_CARE_PROVIDER_SITE_OTHER): Payer: Medicare Other | Admitting: Cardiology

## 2012-07-29 VITALS — BP 132/89 | HR 81 | Ht 69.0 in | Wt 282.8 lb

## 2012-07-29 DIAGNOSIS — I10 Essential (primary) hypertension: Secondary | ICD-10-CM

## 2012-07-29 DIAGNOSIS — R011 Cardiac murmur, unspecified: Secondary | ICD-10-CM

## 2012-07-29 DIAGNOSIS — R0600 Dyspnea, unspecified: Secondary | ICD-10-CM

## 2012-07-29 DIAGNOSIS — E789 Disorder of lipoprotein metabolism, unspecified: Secondary | ICD-10-CM

## 2012-07-29 DIAGNOSIS — R0989 Other specified symptoms and signs involving the circulatory and respiratory systems: Secondary | ICD-10-CM

## 2012-07-29 DIAGNOSIS — R0609 Other forms of dyspnea: Secondary | ICD-10-CM

## 2012-07-29 LAB — CBC WITH DIFFERENTIAL/PLATELET
Basophils Absolute: 0 10*3/uL (ref 0.0–0.1)
Basophils Relative: 0.3 % (ref 0.0–3.0)
Eosinophils Absolute: 0.3 10*3/uL (ref 0.0–0.7)
Eosinophils Relative: 3.6 % (ref 0.0–5.0)
HCT: 48.9 % (ref 39.0–52.0)
Hemoglobin: 16.1 g/dL (ref 13.0–17.0)
Lymphocytes Relative: 27.8 % (ref 12.0–46.0)
Lymphs Abs: 2.1 10*3/uL (ref 0.7–4.0)
MCHC: 32.9 g/dL (ref 30.0–36.0)
MCV: 95 fl (ref 78.0–100.0)
Monocytes Absolute: 0.6 10*3/uL (ref 0.1–1.0)
Monocytes Relative: 8.4 % (ref 3.0–12.0)
Neutro Abs: 4.5 10*3/uL (ref 1.4–7.7)
Neutrophils Relative %: 59.9 % (ref 43.0–77.0)
Platelets: 209 10*3/uL (ref 150.0–400.0)
RBC: 5.15 Mil/uL (ref 4.22–5.81)
RDW: 15.1 % — ABNORMAL HIGH (ref 11.5–14.6)
WBC: 7.4 10*3/uL (ref 4.5–10.5)

## 2012-07-29 LAB — BASIC METABOLIC PANEL
BUN: 19 mg/dL (ref 6–23)
CO2: 32 mEq/L (ref 19–32)
Calcium: 9.4 mg/dL (ref 8.4–10.5)
Chloride: 103 mEq/L (ref 96–112)
Creatinine, Ser: 1.2 mg/dL (ref 0.4–1.5)
GFR: 75.75 mL/min (ref >60.00)
Glucose, Bld: 193 mg/dL — ABNORMAL HIGH (ref 70–99)
Potassium: 3.9 mEq/L (ref 3.5–5.1)
Sodium: 139 mEq/L (ref 135–145)

## 2012-07-29 LAB — PROTIME-INR
INR: 1.1 ratio — ABNORMAL HIGH (ref 0.8–1.0)
Prothrombin Time: 11.5 s (ref 10.2–12.4)

## 2012-07-29 LAB — BRAIN NATRIURETIC PEPTIDE: Pro B Natriuretic peptide (BNP): 90 pg/mL (ref 0.0–100.0)

## 2012-07-29 MED ORDER — CARVEDILOL 12.5 MG PO TABS: 12.5000 mg | ORAL_TABLET | Freq: Two times a day (BID) | ORAL | Status: DC

## 2012-07-29 NOTE — Patient Instructions (Addendum)
Increase coreg(carvedilol) to 12.5mg  two times a day. You can take 2 of your 6.25mg  tablets two times a day and use your current supply.  Your physician recommends that you have lab work today--BMET/BNP/CBCd/PT.   Your physician has requested that you have a cardiac catheterization. Cardiac catheterization is used to diagnose and/or treat various heart conditions. Doctors may recommend this procedure for a number of different reasons. The most common reason is to evaluate chest pain. Chest pain can be a symptom of coronary artery disease (CAD), and cardiac catheterization can show whether plaque is narrowing or blocking your heart's arteries. This procedure is also used to evaluate the valves, as well as measure the blood flow and oxygen levels in different parts of your heart. For further information please visit [email or web address] Please follow instruction sheet, as given. Thursday June 12,2014.   Your physician recommends that you schedule a follow-up appointment in: 2 weeks after cardiac catheterization  with PA/NP.  Your physician recommends that you return for a FASTING lipid profile /liver profile in July 2014.

## 2012-07-29 NOTE — Progress Notes (Signed)
Patient ID: James Mcclain, male   DOB: 1945-10-08, 67 y.o.   MRN: [id number] PCP: Dr. Tanya Nones  67 yo with history of type II diabetes, HTN, CKD presented initially for evaluation of exertional dyspnea.  For the last several months, patient has noted increased fatigue as well as dyspnea with exertion.  He gets very fatig[place] and has to stop frequently to catch his breath.  He gets very tired and short of breath walking up stairs.  This is all new, he used to be able to walk 1 mile and ride and exercise bike at the [facility] with no problem.  No chest pain.  He does not feel that the symptoms are due to hypoglycemia.  In the past, with hypoglycemia he has gotten shaky and lightheaded.  Current exertional symptoms are just dyspnea and fatigue.   BP also has been running high.   I set him up for ETT-Sestamibi.  This showed a hypertensive BP response with extreme dyspnea.  There was a fixed inferior defect with EF 56%.  Echo showed moderate LVH with EF 55-60%.  Patient continues to have the same severe dyspnea with exertion.  Since last appointment, he was diagnosed with prostate cancer.  He is considering radioactive seed implantation.   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 HTN 2. Type II diabetes 3. Gout 4. OA s/p TKR 5. CKD: Suspect diabetic nephropathy 6. Hyperlipidemia 7. Prostate cancer: Diagnosed 5/14.  8. Exertional dyspnea: ETT-Sestamibi with hypertensive BP response, severe dyspnea, inferior fixed defect with EF 56%.  Echo with moderate LVH, EF 55-60%.    SH: Married, retired Midwife, lives in [place].  Nonsmoker.  FH: Brother with CHF, father with "enlarged heart"  ROS: All systems reviewed and negative except as per HPI.   Current Outpatient Prescriptions  Medication Sig Dispense Refill  . allopurinol (ZYLOPRIM) 100 MG tablet Take 2 tablets (200 mg total) by mouth  daily.  30 tablet  6  . amLODipine (NORVASC) 10 MG tablet Take 10 mg by mouth daily.      [name] aspirin EC 81 MG tablet Take 1 tablet (81 mg total) by mouth daily.      [name] atorvastatin (LIPITOR) 40 MG tablet Take 1 tablet (40 mg total) by mouth daily.  30 tablet  3  . Cholecalciferol (VITAMIN D3) 5000 UNITS CAPS Take 10,000 Units by mouth daily.      . insulin glargine (LANTUS) 100 UNIT/ML injection Inject 33 Units into the skin at bedtime.       [name] losartan-hydrochlorothiazide (HYZAAR) 100-25 MG per tablet Take 1 tablet by mouth daily.  30 tablet  3  . pioglitazone (ACTOS) 45 MG tablet Take 45 mg by mouth daily.        [name] zinc gluconate 50 MG tablet Take 100 mg by mouth daily.       . carvedilol (COREG) 12.5 MG tablet Take 1 tablet (12.5 mg total) by mouth 2 (two) times daily.  60 tablet  6   No current facility-administered medications for this visit.   BP 132/89  Pulse 81  Ht 5\' 9"  (1.753 m)  Wt 282 lb 12.8 oz (128.277 kg)  BMI 41.74 kg/m2  SpO2 98% General: NAD, obese Neck: No JVD, no thyromegaly or thyroid nodule.  Lungs: Clear to auscultation bilaterally with normal respiratory effort. CV: Nondisplaced PMI.  Heart regular  S1/S2, +S4, 2/6 early SEM RUSB.  No peripheral edema.  No carotid bruit.  Normal pedal pulses.  Abdomen: Soft, nontender, no hepatosplenomegaly, no distention.  Skin: Intact without lesions or rashes.  Neurologic: Alert and oriented x 3.  Psych: Normal affect. Extremities: No clubbing or cyanosis.  HEENT: Normal.   Assessment/Plan: 1. Exertional dyspnea/fatigue: Patient has noted a clear change over the last couple of months.  Given his history of HTN, DM, and hyperlipidemia, the exertional symptoms are concerning for possible coronary disease.  He also has inferior Qs on his ECG.  He has not, however, had chest pain.  ETT-Sestamibi did not show ischemia but there was a fixed inferior defect consistent with the inferior Qs on ECG.  I am concerned for possible prior  inferior MI.  - Continue ASA - LHC via radial access to diagnose coronary disease.  We discussed risks and benefits of the procedure extensively and the patient agrees to undergo cath.  2. HTN: Increase Coreg to 12.5 mg bid.  He is already on good doses of amlodipine and losartan/HCT.  3. Hyperlipidemia: Continue atorvastatin, lipids/LFTs next month.    4. Murmur: Aortic area systolic murmur.  No significant valvular lesion on echo.     [name] 07/29/2012

## 2012-07-30 ENCOUNTER — Telehealth: Payer: Self-pay | Admitting: *Deleted

## 2012-07-30 NOTE — Telephone Encounter (Signed)
Advised patient of lab results  

## 2012-07-30 NOTE — Telephone Encounter (Signed)
Message copied by Burnell Blanks on Fri Jul 30, 2012  3:59 PM ------      Message from: Laurey Morale      Created: Fri Jul 30, 2012 11:12 AM       Labs ok ------

## 2012-08-05 ENCOUNTER — Other Ambulatory Visit: Payer: Self-pay | Admitting: Cardiology

## 2012-08-05 ENCOUNTER — Encounter (HOSPITAL_COMMUNITY)
Admission: AD | Disposition: A | Discharge status: Home / Self Care | Payer: Self-pay | Source: Ambulatory Visit | Attending: Cardiovascular Disease

## 2012-08-05 ENCOUNTER — Encounter (HOSPITAL_BASED_OUTPATIENT_CLINIC_OR_DEPARTMENT_OTHER)
Admission: RE | Disposition: A | Discharge status: Home / Self Care | Payer: Self-pay | Source: Ambulatory Visit | Attending: Cardiology

## 2012-08-05 ENCOUNTER — Inpatient Hospital Stay (HOSPITAL_BASED_OUTPATIENT_CLINIC_OR_DEPARTMENT_OTHER)
Admission: RE | Admit: 2012-08-05 | Discharge: 2012-08-05 | Disposition: A | Discharge status: Other Acute Inpatient Hospital | Payer: Medicare Other | Source: Ambulatory Visit | Attending: Cardiology | Admitting: Cardiology

## 2012-08-05 ENCOUNTER — Ambulatory Visit (HOSPITAL_COMMUNITY)
Admission: RE | Admit: 2012-08-05 | Discharge: 2012-08-06 | Disposition: A | Discharge status: Home / Self Care | Payer: Medicare Other | Source: Ambulatory Visit | Attending: Cardiology | Admitting: Cardiology

## 2012-08-05 ENCOUNTER — Encounter (HOSPITAL_BASED_OUTPATIENT_CLINIC_OR_DEPARTMENT_OTHER): Payer: Self-pay

## 2012-08-05 DIAGNOSIS — I2510 Atherosclerotic heart disease of native coronary artery without angina pectoris: Secondary | ICD-10-CM | POA: Insufficient documentation

## 2012-08-05 DIAGNOSIS — I200 Unstable angina: Secondary | ICD-10-CM | POA: Diagnosis present

## 2012-08-05 DIAGNOSIS — R0609 Other forms of dyspnea: Secondary | ICD-10-CM | POA: Insufficient documentation

## 2012-08-05 DIAGNOSIS — I209 Angina pectoris, unspecified: Secondary | ICD-10-CM | POA: Insufficient documentation

## 2012-08-05 DIAGNOSIS — E119 Type 2 diabetes mellitus without complications: Secondary | ICD-10-CM | POA: Insufficient documentation

## 2012-08-05 DIAGNOSIS — C61 Malignant neoplasm of prostate: Secondary | ICD-10-CM | POA: Insufficient documentation

## 2012-08-05 DIAGNOSIS — I208 Other forms of angina pectoris: Secondary | ICD-10-CM

## 2012-08-05 DIAGNOSIS — N189 Chronic kidney disease, unspecified: Secondary | ICD-10-CM | POA: Insufficient documentation

## 2012-08-05 DIAGNOSIS — R079 Chest pain, unspecified: Secondary | ICD-10-CM

## 2012-08-05 DIAGNOSIS — Z79899 Other long term (current) drug therapy: Secondary | ICD-10-CM | POA: Insufficient documentation

## 2012-08-05 DIAGNOSIS — I129 Hypertensive chronic kidney disease with stage 1 through stage 4 chronic kidney disease, or unspecified chronic kidney disease: Secondary | ICD-10-CM | POA: Insufficient documentation

## 2012-08-05 DIAGNOSIS — R0600 Dyspnea, unspecified: Secondary | ICD-10-CM | POA: Diagnosis present

## 2012-08-05 DIAGNOSIS — R0989 Other specified symptoms and signs involving the circulatory and respiratory systems: Secondary | ICD-10-CM | POA: Insufficient documentation

## 2012-08-05 DIAGNOSIS — I10 Essential (primary) hypertension: Secondary | ICD-10-CM

## 2012-08-05 DIAGNOSIS — E785 Hyperlipidemia, unspecified: Secondary | ICD-10-CM | POA: Insufficient documentation

## 2012-08-05 DIAGNOSIS — Z7902 Long term (current) use of antithrombotics/antiplatelets: Secondary | ICD-10-CM | POA: Insufficient documentation

## 2012-08-05 DIAGNOSIS — Z955 Presence of coronary angioplasty implant and graft: Secondary | ICD-10-CM

## 2012-08-05 HISTORY — PX: CORONARY ANGIOPLASTY WITH STENT PLACEMENT: SHX49

## 2012-08-05 HISTORY — DX: Atherosclerotic heart disease of native coronary artery without angina pectoris: I25.10

## 2012-08-05 HISTORY — PX: PERCUTANEOUS CORONARY STENT INTERVENTION (PCI-S): SHX5485

## 2012-08-05 HISTORY — DX: Cardiac murmur, unspecified: R01.1

## 2012-08-05 LAB — GLUCOSE, CAPILLARY
Glucose-Capillary: 111 mg/dL — ABNORMAL HIGH (ref 70–99)
Glucose-Capillary: 206 mg/dL — ABNORMAL HIGH (ref 70–99)

## 2012-08-05 LAB — POCT ACTIVATED CLOTTING TIME: Activated Clotting Time: 290 seconds

## 2012-08-05 SURGERY — PERCUTANEOUS CORONARY STENT INTERVENTION (PCI-S)
Anesthesia: LOCAL

## 2012-08-05 SURGERY — JV LEFT HEART CATHETERIZATION WITH CORONARY ANGIOGRAM
Anesthesia: Moderate Sedation

## 2012-08-05 SURGERY — JV CORONARY ANGIOGRAM
Anesthesia: Moderate Sedation

## 2012-08-05 SURGERY — PERCUTANEOUS CORONARY STENT INTERVENTION (PCI-S)
Anesthesia: Moderate Sedation

## 2012-08-05 MED ORDER — SODIUM CHLORIDE 0.9 % IV SOLN
INTRAVENOUS | Status: DC
  Administered 2012-08-05: 12:00:00 via INTRAVENOUS

## 2012-08-05 MED ORDER — HEPARIN SODIUM (PORCINE) 1000 UNIT/ML IJ SOLN
INTRAMUSCULAR | Status: AC
  Filled 2012-08-05: qty 1

## 2012-08-05 MED ORDER — NITROGLYCERIN 0.2 MG/ML ON CALL CATH LAB
INTRAVENOUS | Status: AC
  Filled 2012-08-05: qty 1

## 2012-08-05 MED ORDER — SODIUM CHLORIDE 0.9 % IV SOLN: INTRAVENOUS | Status: AC

## 2012-08-05 MED ORDER — MIDAZOLAM HCL 2 MG/2ML IJ SOLN
INTRAMUSCULAR | Status: AC
  Filled 2012-08-05: qty 2

## 2012-08-05 MED ORDER — ASPIRIN 81 MG PO CHEW: 324.0000 mg | CHEWABLE_TABLET | ORAL | Status: DC

## 2012-08-05 MED ORDER — PIOGLITAZONE HCL 45 MG PO TABS
45.0000 mg | ORAL_TABLET | Freq: Every day | ORAL | Status: DC
  Administered 2012-08-06: 45 mg via ORAL
  Filled 2012-08-05: qty 1

## 2012-08-05 MED ORDER — ASPIRIN EC 81 MG PO TBEC
81.0000 mg | DELAYED_RELEASE_TABLET | Freq: Every day | ORAL | Status: DC
  Administered 2012-08-06: 81 mg via ORAL
  Filled 2012-08-05: qty 1

## 2012-08-05 MED ORDER — LOSARTAN POTASSIUM-HCTZ 100-25 MG PO TABS: 1.0000 | ORAL_TABLET | Freq: Every day | ORAL | Status: DC

## 2012-08-05 MED ORDER — VERAPAMIL HCL 2.5 MG/ML IV SOLN
INTRAVENOUS | Status: AC
  Filled 2012-08-05: qty 2

## 2012-08-05 MED ORDER — CLOPIDOGREL BISULFATE 300 MG PO TABS
600.0000 mg | ORAL_TABLET | Freq: Once | ORAL | Status: AC
  Administered 2012-08-05: 600 mg via ORAL

## 2012-08-05 MED ORDER — ASPIRIN 81 MG PO CHEW
324.0000 mg | CHEWABLE_TABLET | ORAL | Status: AC
  Administered 2012-08-05: 324 mg via ORAL

## 2012-08-05 MED ORDER — CLOPIDOGREL BISULFATE 300 MG PO TABS: 600.0000 mg | ORAL_TABLET | Freq: Once | ORAL | Status: DC

## 2012-08-05 MED ORDER — SODIUM CHLORIDE 0.9 % IV SOLN: INTRAVENOUS | Status: DC

## 2012-08-05 MED ORDER — HEPARIN (PORCINE) IN NACL 2-0.9 UNIT/ML-% IJ SOLN
INTRAMUSCULAR | Status: AC
  Filled 2012-08-05: qty 1000

## 2012-08-05 MED ORDER — SODIUM CHLORIDE 0.9 % IJ SOLN: 3.0000 mL | INTRAMUSCULAR | Status: DC | PRN

## 2012-08-05 MED ORDER — SODIUM CHLORIDE 0.9 % IJ SOLN: 3.0000 mL | Freq: Two times a day (BID) | INTRAMUSCULAR | Status: DC

## 2012-08-05 MED ORDER — SODIUM CHLORIDE 0.9 % IV SOLN: 250.0000 mL | INTRAVENOUS | Status: DC | PRN

## 2012-08-05 MED ORDER — FENTANYL CITRATE 0.05 MG/ML IJ SOLN
INTRAMUSCULAR | Status: AC
  Filled 2012-08-05: qty 2

## 2012-08-05 MED ORDER — CLOPIDOGREL BISULFATE 300 MG PO TABS: 600.0000 mg | ORAL_TABLET | Freq: Every day | ORAL | Status: DC

## 2012-08-05 MED ORDER — INSULIN GLARGINE 100 UNIT/ML ~~LOC~~ SOLN
33.0000 [IU] | Freq: Every day | SUBCUTANEOUS | Status: DC
  Administered 2012-08-05: 33 [IU] via SUBCUTANEOUS
  Filled 2012-08-05 (×3): qty 0.33

## 2012-08-05 MED ORDER — ALLOPURINOL 100 MG PO TABS
200.0000 mg | ORAL_TABLET | Freq: Every day | ORAL | Status: DC
  Administered 2012-08-06: 10:00:00 200 mg via ORAL
  Filled 2012-08-05: qty 2

## 2012-08-05 MED ORDER — LIDOCAINE HCL (PF) 1 % IJ SOLN
INTRAMUSCULAR | Status: AC
  Filled 2012-08-05: qty 30

## 2012-08-05 MED ORDER — ATORVASTATIN CALCIUM 40 MG PO TABS
40.0000 mg | ORAL_TABLET | Freq: Every day | ORAL | Status: DC
  Administered 2012-08-05 – 2012-08-06 (×2): 40 mg via ORAL
  Filled 2012-08-05 (×2): qty 1

## 2012-08-05 MED ORDER — AMLODIPINE BESYLATE 10 MG PO TABS
10.0000 mg | ORAL_TABLET | Freq: Every day | ORAL | Status: DC
Start: 2012-08-06 — End: 2012-08-06
  Administered 2012-08-06: 10 mg via ORAL
  Filled 2012-08-05: qty 1

## 2012-08-05 MED ORDER — CLOPIDOGREL BISULFATE 300 MG PO TABS
ORAL_TABLET | ORAL | Status: AC
  Filled 2012-08-05: qty 2

## 2012-08-05 MED ORDER — ACETAMINOPHEN 325 MG PO TABS
650.0000 mg | ORAL_TABLET | ORAL | Status: DC | PRN
  Administered 2012-08-05: 20:00:00 650 mg via ORAL
  Filled 2012-08-05: qty 2

## 2012-08-05 MED ORDER — ONDANSETRON HCL 4 MG/2ML IJ SOLN: 4.0000 mg | Freq: Four times a day (QID) | INTRAMUSCULAR | Status: DC | PRN

## 2012-08-05 MED ORDER — HYDROCHLOROTHIAZIDE 25 MG PO TABS
25.0000 mg | ORAL_TABLET | Freq: Every day | ORAL | Status: DC
  Administered 2012-08-06: 10:00:00 25 mg via ORAL
  Filled 2012-08-05: qty 1

## 2012-08-05 MED ORDER — LOSARTAN POTASSIUM 50 MG PO TABS
100.0000 mg | ORAL_TABLET | Freq: Every day | ORAL | Status: DC
  Administered 2012-08-06: 100 mg via ORAL
  Filled 2012-08-05: qty 2

## 2012-08-05 MED ORDER — CARVEDILOL 12.5 MG PO TABS
12.5000 mg | ORAL_TABLET | Freq: Two times a day (BID) | ORAL | Status: DC
  Administered 2012-08-05 – 2012-08-06 (×2): 12.5 mg via ORAL
  Filled 2012-08-05 (×3): qty 1

## 2012-08-05 NOTE — CV Procedure (Signed)
   CARDIAC CATH NOTE  Name: James Mcclain MRN: [id number] DOB: [birth year]  Procedure: PTCA and stenting of the LAD  Indication: CCS Class 3 angina, abnormal stress test, on max med Rx. 67 year-old male underwent diagnostic cath this am with Dr Shirlee Latch demonstrating severe proximal LAD stenosis extending into mid-vessel. Referred for PCI. He was loaded with plavix 600 mg.  Procedural Details: There was an indwelling 5 Fr radial sheath. This was changed out to a 6 Fr sheath over a 0.035 guidewire.  3 mg verapamil was administered through the radial sheath. Heparin was used for anticoagulation. Once a therapeutic ACT was achieved, a 6 [place] XB-LAD 4 cm guide guide catheter was inserted. It was difficult to reach the left main.  A BMW coronary guidewire was used to cross the lesion.  The lesion was predilated with a 2.5x15 mm balloon.  The lesion extended across the first diagonal. I thought he would have better stent patency covering the entire segment of disease. The lesion was then stented with a 3.0x33 mm Xience drug-eluting stent.  The stent was postdilated with a 3.5 noncompliant balloon.  Following PCI, there was 0% residual stenosis and TIMI-3 flow. Final angiography confirmed an excellent result. The patient tolerated the procedure well. There were no immediate procedural complications. A TR band was used for radial hemostasis. The patient was transferred to the post catheterization recovery area for further monitoring.  Lesion Data: Vessel: LAD/prox Percent stenosis (pre): 90 TIMI-flow (pre):  3 Stent:  3.0x33 Xience DES Percent stenosis (post): 0 TIMI-flow (post): 3  Conclusions: Successful PCI of the LAD  Recommendations: DAPT with aspirin and plavix for 12 months, then indefinite aspirin.  Tonny Bollman 08/05/2012, 10:51 PM

## 2012-08-05 NOTE — H&P (View-Only) (Signed)
Patient ID: James Mcclain, male   DOB: 01/08/1946, 66 y.o.   MRN: [id number] PCP: Dr. [name]  66 yo with history of type II diabetes, HTN, CKD presented initially for evaluation of exertional dyspnea.  For the last several months, patient has noted increased fatigue as well as dyspnea with exertion.  He gets very fatigued when he walks [place] the [place] and has to stop frequently to catch his breath.  He gets very tired and short of breath walking up stairs.  This is all new, he used to be able to walk 1 mile and ride and exercise bike at the [employer] with no problem.  No chest pain.  He does not feel that the symptoms are due to hypoglycemia.  In the past, with hypoglycemia he has gotten shaky and lightheaded.  Current exertional symptoms are just dyspnea and fatigue.   BP also has been running high.   I set him up for ETT-Sestamibi.  This showed a hypertensive BP response with extreme dyspnea.  There was a fixed inferior defect with EF 56%.  Echo showed moderate LVH with EF 55-60%.  Patient continues to have the same severe dyspnea with exertion.  Since last appointment, he was diagnosed with prostate cancer.  He is considering radioactive seed implantation.   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 HTN 2. Type II diabetes 3. Gout 4. OA s/p TKR 5. CKD: Suspect diabetic nephropathy 6. Hyperlipidemia 7. Prostate cancer: Diagnosed 5/14.  8. Exertional dyspnea: ETT-Sestamibi with hypertensive BP response, severe dyspnea, inferior fixed defect with EF 56%.  Echo with moderate LVH, EF 55-60%.    SH: Married, retired bus driver, lives in [place].  Nonsmoker.  FH: Brother with CHF, father with "enlarged heart"  ROS: All systems reviewed and negative except as per HPI.   Current Outpatient Prescriptions  Medication Sig Dispense Refill  . allopurinol (ZYLOPRIM) 100 MG tablet Take 2 tablets (200 mg total) by mouth  daily.  30 tablet  6  . amLODipine (NORVASC) 10 MG tablet Take 10 mg by mouth daily.      . aspirin EC 81 MG tablet Take 1 tablet (81 mg total) by mouth daily.      . atorvastatin (LIPITOR) 40 MG tablet Take 1 tablet (40 mg total) by mouth daily.  30 tablet  3  . Cholecalciferol (VITAMIN D3) 5000 UNITS CAPS Take 10,000 Units by mouth daily.      . insulin glargine (LANTUS) 100 UNIT/ML injection Inject 33 Units into the skin at bedtime.       . losartan-hydrochlorothiazide (HYZAAR) 100-25 MG per tablet Take 1 tablet by mouth daily.  30 tablet  3  . pioglitazone (ACTOS) 45 MG tablet Take 45 mg by mouth daily.        . zinc gluconate 50 MG tablet Take 100 mg by mouth daily.       . carvedilol (COREG) 12.5 MG tablet Take 1 tablet (12.5 mg total) by mouth 2 (two) times daily.  60 tablet  6   No current facility-administered medications for this visit.   BP 132/89  Pulse 81  Ht 5' 9" (1.753 m)  Wt 282 lb 12.8 oz (128.277 kg)  BMI 41.74 kg/m2  SpO2 98% General: NAD, obese Neck: No JVD, no thyromegaly or thyroid nodule.  Lungs: Clear to auscultation bilaterally with normal respiratory effort. CV: Nondisplaced PMI.  Heart regular   S1/S2, +S4, 2/6 early SEM RUSB.  No peripheral edema.  No carotid bruit.  Normal pedal pulses.  Abdomen: Soft, nontender, no hepatosplenomegaly, no distention.  Skin: Intact without lesions or rashes.  Neurologic: Alert and oriented x 3.  Psych: Normal affect. Extremities: No clubbing or cyanosis.  HEENT: Normal.   Assessment/Plan: 1. Exertional dyspnea/fatigue: Patient has noted a clear change over the last couple of months.  Given his history of HTN, DM, and hyperlipidemia, the exertional symptoms are concerning for possible coronary disease.  He also has inferior Qs on his ECG.  He has not, however, had chest pain.  ETT-Sestamibi did not show ischemia but there was a fixed inferior defect consistent with the inferior Qs on ECG.  I am concerned for possible prior  inferior MI.  - Continue ASA - LHC via radial access to diagnose coronary disease.  We discussed risks and benefits of the procedure extensively and the patient agrees to undergo cath.  2. HTN: Increase Coreg to 12.5 mg bid.  He is already on good doses of amlodipine and losartan/HCT.  3. Hyperlipidemia: Continue atorvastatin, lipids/LFTs next month.    4. Murmur: Aortic area systolic murmur.  No significant valvular lesion on echo.     [name] 07/29/2012   

## 2012-08-05 NOTE — CV Procedure (Deleted)
Procedure: TEE  Indication: CVA  Sedation: Versed 6 mg IV, Versed 100 mcg IV  Findings: Normal LV size with mild LVH.  EF 60%.  Normal RV size and systolic function.  No significant valvular abnormalities.  No LAA thrombus.  There was grade IV plaque in the descending thoracic aorta, grade III plaque in the arch. There was a very small PFO, a couple of bubbles only crossed over with a valsalva.    No complications.   James Mcclain 08/05/2012 3:04 PM

## 2012-08-05 NOTE — Interval H&P Note (Signed)
History and Physical Interval Note:  08/05/2012 12:39 PM  James Mcclain  has presented today for surgery, with the diagnosis of CP  The various methods of treatment have been discussed with the patient and family. After consideration of risks, benefits and other options for treatment, the patient has consented to  Procedure(s): JV LEFT HEART CATHETERIZATION WITH CORONARY ANGIOGRAM (N/A) as a surgical intervention .  The patient's history has been reviewed, patient examined, no change in status, stable for surgery.  I have reviewed the patient's chart and labs.  Questions were answered to the patient's satisfaction.     Dalton [employer]

## 2012-08-05 NOTE — H&P (View-Only) (Signed)
Patient has allergy to contrast noted.  He has had contrast before with no allergic reaction.  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  This does not constitute a significant contrast allergy risk.   James Mcclain 08/05/2012 12:49 PM

## 2012-08-05 NOTE — CV Procedure (Addendum)
   Cardiac Catheterization Procedure Note  Name: James Mcclain MRN: [id number] DOB: 17-Mar-1945  Procedure: Selective Coronary Angiography  Indication: Patient has recent onset of significant exertional dyspnea that has been significantly limiting his activity.  Myoview showed a fixed defect but patient has continued to have significant symptoms despite amlodipine and Coreg use.    Procedural Details: Allen's test was positive on the right wrist.  The right wrist was prepped, draped, and anesthetized with 1% lidocaine. Using the modified Seldinger technique, a 5 French sheath was introduced into the right radial artery. 3 mg of verapamil was administered through the sheath, weight-based unfractionated heparin was administered intravenously. AL1 catheter was used for selective coronary angiography, catheter exchanges were performed over an exchange length guidewire. There were no immediate procedural complications. The patient will be transferred to the inpatient cath lab for PCI.   Procedural Findings: Hemodynamics: AO 122/66  Coronary angiography: Coronary dominance: Codominant  Left mainstem: Short, no significant disease.   Left anterior descending (LAD): There is a 90% proximal LAD stenosis between small 1st and 2nd diagonals.   Left circumflex (LCx): Large system.  Moderate ramus with 30% proximal disease.  LCx with 30% mid vessel stenosis.   Right coronary artery (RCA): Relatively small, codominant RCA.  40% mid RCA stenosis.   Left ventriculography: Not done.  Recent echo with normal EF.   Final Conclusions:  90% proximal LAD stenosis.  The patient has had significant exertional dyspnea for several weeks which appears to be his anginal equivalent.  CCS class III angina (with mild exertion).  He is on good doses of Coreg and amlodipine.  I think that PCI is warranted in this situation for symptom management.   Major comorbidity is prostate cancer.  Plan for probable seed  implantation.   Recommendations: Patient will go to the main lab for PCI today. Will give Plavix 600 mg x 1.  [name] 08/05/2012, 1:32 PM

## 2012-08-05 NOTE — Interval H&P Note (Signed)
History and Physical Interval Note:  08/05/2012 3:38 PM  James Mcclain  has presented today for surgery, with the diagnosis of CAD  The various methods of treatment have been discussed with the patient and family. After consideration of risks, benefits and other options for treatment, the patient has consented to  Procedure(s): PERCUTANEOUS CORONARY STENT INTERVENTION (PCI-S) (N/A) as a surgical intervention .  The patient's history has been reviewed, patient examined, no change in status, stable for surgery.  I have reviewed the patient's chart and labs.  Questions were answered to the patient's satisfaction.    Cath Lab Visit (complete for each Cath Lab visit)  Clinical Evaluation Leading to the Procedure:   ACS: no  Non-ACS:    Anginal Classification: CCS III  Anti-ischemic medical therapy: Maximal Therapy (2 or more classes of medications)  Non-Invasive Test Results: Low-risk stress test findings: cardiac mortality <1%/year  Prior CABG: No previous CABG        Tonny Bollman

## 2012-08-05 NOTE — Progress Notes (Signed)
Patient has allergy to contrast noted.  He has had contrast before with no allergic reaction.  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  This does not constitute a significant contrast allergy risk.   Landa Mullinax 08/05/2012 12:49 PM    

## 2012-08-06 ENCOUNTER — Encounter (HOSPITAL_COMMUNITY): Payer: Self-pay | Admitting: General Practice

## 2012-08-06 DIAGNOSIS — I208 Other forms of angina pectoris: Secondary | ICD-10-CM

## 2012-08-06 DIAGNOSIS — I200 Unstable angina: Secondary | ICD-10-CM | POA: Diagnosis present

## 2012-08-06 LAB — BASIC METABOLIC PANEL
BUN: 19 mg/dL (ref 6–23)
CO2: 26 mEq/L (ref 19–32)
Calcium: 9 mg/dL (ref 8.4–10.5)
Chloride: 102 mEq/L (ref 96–112)
Creatinine, Ser: 1.31 mg/dL (ref 0.50–1.35)
GFR calc Af Amer: 64 mL/min — ABNORMAL LOW (ref >90)
GFR calc non Af Amer: 56 mL/min — ABNORMAL LOW (ref >90)
Glucose, Bld: 159 mg/dL — ABNORMAL HIGH (ref 70–99)
Potassium: 3.5 mEq/L (ref 3.5–5.1)
Sodium: 137 mEq/L (ref 135–145)

## 2012-08-06 LAB — CBC
HCT: 48.5 % (ref 39.0–52.0)
Hemoglobin: 16.1 g/dL (ref 13.0–17.0)
MCH: 31.3 pg (ref 26.0–34.0)
MCHC: 33.2 g/dL (ref 30.0–36.0)
MCV: 94.4 fL (ref 78.0–100.0)
Platelets: 185 10*3/uL (ref 150–400)
RBC: 5.14 MIL/uL (ref 4.22–5.81)
RDW: 14.4 % (ref 11.5–15.5)
WBC: 8.6 10*3/uL (ref 4.0–10.5)

## 2012-08-06 LAB — POCT I-STAT GLUCOSE
Glucose, Bld: 129 mg/dL — ABNORMAL HIGH (ref 70–99)
Operator id: 221371

## 2012-08-06 MED ORDER — CLOPIDOGREL BISULFATE 75 MG PO TABS: 75.0000 mg | ORAL_TABLET | Freq: Every day | ORAL | Status: DC

## 2012-08-06 MED ORDER — CLOPIDOGREL BISULFATE 75 MG PO TABS
75.0000 mg | ORAL_TABLET | Freq: Every day | ORAL | Status: DC
  Administered 2012-08-06: 75 mg via ORAL
  Filled 2012-08-06: qty 1

## 2012-08-06 MED ORDER — ATORVASTATIN CALCIUM 40 MG PO TABS: 40.0000 mg | ORAL_TABLET | Freq: Every day | ORAL | Status: DC

## 2012-08-06 NOTE — Progress Notes (Signed)
TR BAND REMOVAL  LOCATION:    right radial  DEFLATED PER PROTOCOL:    yes  TIME BAND OFF / DRESSING APPLIED:    21:30   SITE UPON ARRIVAL:    Level 0  SITE AFTER BAND REMOVAL:    Level 0  REVERSE ALLEN'S TEST:     positive  CIRCULATION SENSATION AND MOVEMENT:    Within Normal Limits   yes  COMMENTS:    

## 2012-08-06 NOTE — Progress Notes (Signed)
Patient ID: James Mcclain, male   DOB: 05-22-1945, 67 y.o.   MRN: [id number]    SUBJECTIVE: Feels good this morning.  No chest pain.  No dyspnea with ambulation with cardiac rehab.   . allopurinol  200 mg Oral Daily  . amLODipine  10 mg Oral Daily  . aspirin EC  81 mg Oral Daily  . atorvastatin  40 mg Oral Daily  . carvedilol  12.5 mg Oral BID  . clopidogrel  75 mg Oral Q breakfast  . losartan  100 mg Oral Daily   And  . hydrochlorothiazide  25 mg Oral Daily  . insulin glargine  33 Units Subcutaneous QHS  . pioglitazone  45 mg Oral Daily      Filed Vitals:   08/06/12 0415 08/06/12 0430 08/06/12 0500 08/06/12 0731  BP: 121/60 113/62 130/66 130/74  Pulse: 66 70 62 64  Temp:    97.9 F (36.6 C)  TempSrc:    Oral  Resp: 16 14 16 14   SpO2: 98% 98% 99% 95%    Intake/Output Summary (Last 24 hours) at 08/06/12 0837 Last data filed at 08/06/12 0733  Gross per 24 hour  Intake   1005 ml  Output   1200 ml  Net   -195 ml    LABS: Basic Metabolic Panel:  Recent Labs  13/08/65 0430  NA 137  K 3.5  CL 102  CO2 26  GLUCOSE 159*  BUN 19  CREATININE 1.31  CALCIUM 9.0   Liver Function Tests: No results found for this basename: AST, ALT, ALKPHOS, BILITOT, PROT, ALBUMIN,  in the last 72 hours No results found for this basename: LIPASE, AMYLASE,  in the last 72 hours CBC:  Recent Labs  08/06/12 0430  WBC 8.6  HGB 16.1  HCT 48.5  MCV 94.4  PLT 185   Cardiac Enzymes: No results found for this basename: CKTOTAL, CKMB, CKMBINDEX, TROPONINI,  in the last 72 hours BNP: No components found with this basename: POCBNP,  D-Dimer: No results found for this basename: DDIMER,  in the last 72 hours Hemoglobin A1C: No results found for this basename: HGBA1C,  in the last 72 hours Fasting Lipid Panel: No results found for this basename: CHOL, HDL, LDLCALC, TRIG, CHOLHDL, LDLDIRECT,  in the last 72 hours Thyroid Function Tests: No results found for this basename: TSH, T4TOTAL,  FREET3, T3FREE, THYROIDAB,  in the last 72 hours Anemia Panel: No results found for this basename: VITAMINB12, FOLATE, FERRITIN, TIBC, IRON, RETICCTPCT,  in the last 72 hours  RADIOLOGY: No results found.  PHYSICAL EXAM General: NAD Neck: No JVD, no thyromegaly or thyroid nodule.  Lungs: Clear to auscultation bilaterally with normal respiratory effort. CV: Nondisplaced PMI.  Heart regular S1/S2, no S3/S4, no murmur.  No peripheral edema.  No carotid bruit.  Normal pedal pulses.  Abdomen: Soft, nontender, no hepatosplenomegaly, no distention.  Neurologic: Alert and oriented x 3.  Psych: Normal affect. Extremities: No clubbing or cyanosis.   TELEMETRY: Reviewed telemetry pt in NSR  ASSESSMENT AND PLAN: 67 yo with DM and HTN had developed significant exertional dyspnea that appears to have been his anginal equivalent.  Cath yesterday showed 90% LAD stenosis and he is s/p DES to LAD.  He can go home today.  He will continue his outpatient meds with the addition of clopidogrel 75 mg daily x 1 year.  He will need to start cardiac rehab.  Followup with me or PA in 2 wks.   Elesa Garman [employer]  08/06/2012 8:40 AM

## 2012-08-06 NOTE — Discharge Summary (Signed)
CARDIOLOGY DISCHARGE SUMMARY   Patient ID: James Mcclain MRN: [id number] DOB/AGE: 1945-12-30 67 y.o.  Admit date: 08/05/2012 Discharge date: 08/06/2012  Primary Discharge Diagnosis:  Crescendo angina - Dyspnea is anginal equivalent: S/P 3.0x33 mm Xience drug-eluting stent to the LAD  Secondary Discharge Diagnosis:    Type II or unspecified type diabetes mellitus without mention of complication, not stated as uncontrolled   Hypertension   Exertional dyspnea  Procedures:  Selective Coronary Angiography, PTCA and stenting of the LAD  Hospital Course: James Mcclain is a 67 y.o. male with no history of CAD. He was seen in the office on 07/29/2012 and was having exertional dyspnea. A stress test showed possible scar so cardiac catheterization was indicated. [facility] came to [facility] for the procedure on 08/05/2012.  Cardiac catheterization results are below. He was taken to the main lab for PCI. He had a DES to the LAD. He tolerated the procedure well. He was admitted overnight.  On 08/06/2012, he was seen by cardiac rehab and by Dr Shirlee Latch. His cath site had no problems. He was ambulating without chest pain or SOB and considered stable for discharge, to follow up as an outpatient.   Labs:  Lab Results  Component Value Date   WBC 8.6 08/06/2012   HGB 16.1 08/06/2012   HCT 48.5 08/06/2012   MCV 94.4 08/06/2012   PLT 185 08/06/2012     Recent Labs Lab 08/06/12 0430  NA 137  K 3.5  CL 102  CO2 26  BUN 19  CREATININE 1.31  CALCIUM 9.0  GLUCOSE 159*   Pro B Natriuretic peptide (BNP)  Date/Time Value Range Status  07/29/2012 11:37 AM 90.0  0.0 - 100.0 pg/mL Final   Cardiac Cath:  08/05/2012 Left mainstem: Short, no significant disease.  Left anterior descending (LAD): There is a 90% proximal LAD stenosis between small 1st and 2nd diagonals.  Left circumflex (LCx): Large system. Moderate ramus with 30% proximal disease. LCx with 30% mid vessel stenosis.  Right coronary artery (RCA):  Relatively small, codominant RCA. 40% mid RCA stenosis.  Left ventriculography: Not done. Recent echo with normal EF.  Final Conclusions: 90% proximal LAD stenosis. The patient has had significant exertional dyspnea for several weeks which appears to be his anginal equivalent. CCS class III angina (with mild exertion). He is on good doses of Coreg and amlodipine. I think that PCI is warranted in this situation for symptom management.  Lesion Data:  Vessel: LAD/prox  Percent stenosis (pre): 90  TIMI-flow (pre): 3  Stent: 3.0x33 Xience DES  Percent stenosis (post): 0  TIMI-flow (post): 3  Conclusions: Successful PCI of the LAD  Recommendations: DAPT with aspirin and plavix for 12 months, then indefinite aspirin.  EKG:  06-Aug-2012 04:03:44  Normal sinus rhythm Inferior infarct , age undetermined Cannot rule out Anterior infarct , age undetermined Vent. rate 69 BPM PR interval 182 ms QRS duration 92 ms QT/QTc 412/441 ms P-R-T axes 63 -29 13  FOLLOW UP PLANS AND APPOINTMENTS Allergies  Allergen Reactions  . Fish Allergy Swelling     Medication List    TAKE these medications       allopurinol 100 MG tablet  Commonly known as:  ZYLOPRIM  Take 2 tablets (200 mg total) by mouth daily.     amLODipine 10 MG tablet  Commonly known as:  NORVASC  Take 10 mg by mouth daily.     aspirin EC 81 MG tablet  Take 1 tablet (81  mg total) by mouth daily.     atorvastatin 40 MG tablet  Commonly known as:  LIPITOR  Take 1 tablet (40 mg total) by mouth daily.     carvedilol 12.5 MG tablet  Commonly known as:  COREG  Take 1 tablet (12.5 mg total) by mouth 2 (two) times daily.     clopidogrel 75 MG tablet  Commonly known as:  PLAVIX  Take 1 tablet (75 mg total) by mouth daily with breakfast.     insulin glargine 100 UNIT/ML injection  Commonly known as:  LANTUS  Inject 33 Units into the skin at bedtime.     losartan-hydrochlorothiazide 100-25 MG per tablet  Commonly known as:  HYZAAR   Take 1 tablet by mouth daily.     pioglitazone 45 MG tablet  Commonly known as:  ACTOS  Take 45 mg by mouth daily.     Vitamin D3 5000 UNITS Caps  Take 10,000 Units by mouth daily.     zinc gluconate 50 MG tablet  Take 100 mg by mouth daily.        Discharge Orders   Future Appointments Provider Department Dept Phone   08/20/2012 9:30 AM Rosalio Macadamia, NP [facility] Main Office [place]) [phone]   08/31/2012 10:30 AM [facility] Main Office [place]) [phone]   Future Orders Complete By Expires     Amb Referral to Cardiac Rehabilitation  As directed     Diet - low sodium heart healthy  As directed     Diet Carb Modified  As directed     Increase activity slowly  As directed         BRING ALL MEDICATIONS WITH YOU TO FOLLOW UP APPOINTMENTS  Time spent with patient to include physician time: 35 min Signed: [name] Demark, PA-C 08/06/2012, 12:56 PM Co-Sign MD

## 2012-08-06 NOTE — Progress Notes (Signed)
Utilization Review Completed Jet Traynham J. Klani Caridi, RN, BSN, NCM [phone]  

## 2012-08-06 NOTE — Progress Notes (Signed)
CARDIAC REHAB PHASE I   PRE:  Rate/Rhythm: 64 SR    BP: sitting 148/72    SaO2:   MODE:  Ambulation: 650 ft   POST:  Rate/Rhythm: 104 ST    BP: sitting 134/67     SaO2:   Tolerated well, no SOB. Sts he feels much improved and can tell a difference. HR up 40 bts per min from rest. Ed completed, very receptive. Will send referral to [place] [facility]. [id number]   Elissa Lovett [place] CES, ACSM 08/06/2012 9:05 AM

## 2012-08-20 ENCOUNTER — Encounter: Payer: Self-pay | Admitting: Nurse Practitioner

## 2012-08-20 ENCOUNTER — Other Ambulatory Visit: Payer: Self-pay | Admitting: *Deleted

## 2012-08-20 ENCOUNTER — Ambulatory Visit (INDEPENDENT_AMBULATORY_CARE_PROVIDER_SITE_OTHER): Payer: Medicare Other | Admitting: Nurse Practitioner

## 2012-08-20 ENCOUNTER — Ambulatory Visit: Payer: Medicare Other

## 2012-08-20 ENCOUNTER — Telehealth: Payer: Self-pay | Admitting: Cardiology

## 2012-08-20 VITALS — BP 110/70 | HR 86 | Ht 70.0 in | Wt 276.1 lb

## 2012-08-20 DIAGNOSIS — Z955 Presence of coronary angioplasty implant and graft: Secondary | ICD-10-CM

## 2012-08-20 DIAGNOSIS — Z9861 Coronary angioplasty status: Secondary | ICD-10-CM

## 2012-08-20 DIAGNOSIS — E785 Hyperlipidemia, unspecified: Secondary | ICD-10-CM

## 2012-08-20 DIAGNOSIS — E7800 Pure hypercholesterolemia, unspecified: Secondary | ICD-10-CM

## 2012-08-20 LAB — LIPID PANEL
Cholesterol: 102 mg/dL (ref 0–200)
HDL: 28.4 mg/dL — ABNORMAL LOW (ref >39.00)
LDL Cholesterol: 55 mg/dL (ref 0–99)
Total CHOL/HDL Ratio: 4
Triglycerides: 92 mg/dL (ref 0.0–149.0)
VLDL: 18.4 mg/dL (ref 0.0–40.0)

## 2012-08-20 LAB — CBC WITH DIFFERENTIAL/PLATELET
Basophils Absolute: 0 10*3/uL (ref 0.0–0.1)
Basophils Relative: 0.5 % (ref 0.0–3.0)
Eosinophils Absolute: 0.2 10*3/uL (ref 0.0–0.7)
Eosinophils Relative: 2.8 % (ref 0.0–5.0)
HCT: 47.9 % (ref 39.0–52.0)
Hemoglobin: 15.8 g/dL (ref 13.0–17.0)
Lymphocytes Relative: 25.8 % (ref 12.0–46.0)
Lymphs Abs: 1.9 10*3/uL (ref 0.7–4.0)
MCHC: 32.9 g/dL (ref 30.0–36.0)
MCV: 95.9 fl (ref 78.0–100.0)
Monocytes Absolute: 0.6 10*3/uL (ref 0.1–1.0)
Monocytes Relative: 8.8 % (ref 3.0–12.0)
Neutro Abs: 4.6 10*3/uL (ref 1.4–7.7)
Neutrophils Relative %: 62.1 % (ref 43.0–77.0)
Platelets: 203 10*3/uL (ref 150.0–400.0)
RBC: 4.99 Mil/uL (ref 4.22–5.81)
RDW: 15.6 % — ABNORMAL HIGH (ref 11.5–14.6)
WBC: 7.4 10*3/uL (ref 4.5–10.5)

## 2012-08-20 LAB — HEPATIC FUNCTION PANEL
ALT: 13 U/L (ref 0–53)
AST: 11 U/L (ref 0–37)
Albumin: 3.7 g/dL (ref 3.5–5.2)
Alkaline Phosphatase: 56 U/L (ref 39–117)
Bilirubin, Direct: 0 mg/dL (ref 0.0–0.3)
Total Bilirubin: 0.6 mg/dL (ref 0.3–1.2)
Total Protein: 7.3 g/dL (ref 6.0–8.3)

## 2012-08-20 LAB — BASIC METABOLIC PANEL
BUN: 19 mg/dL (ref 6–23)
CO2: 27 mEq/L (ref 19–32)
Calcium: 8.8 mg/dL (ref 8.4–10.5)
Chloride: 105 mEq/L (ref 96–112)
Creatinine, Ser: 1.4 mg/dL (ref 0.4–1.5)
GFR: 64.16 mL/min (ref >60.00)
Glucose, Bld: 97 mg/dL (ref 70–99)
Potassium: 3.6 mEq/L (ref 3.5–5.1)
Sodium: 140 mEq/L (ref 135–145)

## 2012-08-20 NOTE — Progress Notes (Signed)
James Mcclain Date of Birth: 1945/11/12 Medical Record #[id number]  History of Present Illness: Mr. Chesnut is seen back today for a post hospital visit. Seen for Dr. Shirlee Latch. He has had no prior history of CAD but has HTN and type 2 DM.   Most recently proceeded with cardiac cath due to abnormal stress test showing possible scar. He subsequently had DES to the LAD. His angina equivalent is DOE.   He comes in today. He is here alone. Doing well. Breathing is great!. He is back "to normal". No problems noted. Wanting to go to cardiac rehab. No problems with his cath site.   Current Outpatient Prescriptions  Medication Sig Dispense Refill  . allopurinol (ZYLOPRIM) 100 MG tablet Take 2 tablets (200 mg total) by mouth daily.  30 tablet  6  . amLODipine (NORVASC) 10 MG tablet Take 10 mg by mouth daily.      [name] aspirin EC 81 MG tablet Take 1 tablet (81 mg total) by mouth daily.      [name] atorvastatin (LIPITOR) 40 MG tablet Take 1 tablet (40 mg total) by mouth daily.  30 tablet  3  . carvedilol (COREG) 12.5 MG tablet Take 1 tablet (12.5 mg total) by mouth 2 (two) times daily.  60 tablet  6  . Cholecalciferol (VITAMIN D3) 5000 UNITS CAPS Take 10,000 Units by mouth daily.      . clopidogrel (PLAVIX) 75 MG tablet Take 1 tablet (75 mg total) by mouth daily with breakfast.  30 tablet  11  . insulin glargine (LANTUS) 100 UNIT/ML injection Inject 33 Units into the skin at bedtime.       [name] losartan-hydrochlorothiazide (HYZAAR) 100-25 MG per tablet Take 1 tablet by mouth daily.  30 tablet  3  . pioglitazone (ACTOS) 45 MG tablet Take 45 mg by mouth daily.        [name] zinc gluconate 50 MG tablet Take 100 mg by mouth daily.        No current facility-administered medications for this visit.    Allergies  Allergen Reactions  . Fish Allergy Swelling    Past Medical History  Diagnosis Date  . Arthritis   . Diabetes mellitus   . Cholesterol serum elevated   . Hypertension   . Elevated PSA   . Coronary artery  disease 08/05/2012    s/p Xience DES to the LAD  . Heart murmur   . Shortness of breath     Past Surgical History  Procedure Laterality Date  . Kidney stones      2006  . Tonsillectomy    . Total knee arthroplasty  01/24/2011    Procedure: TOTAL KNEE ARTHROPLASTY;  Surgeon: Harvie Junior;  Location: [facility] OR;  Service: Orthopedics;  Laterality: Left;  COMPUTER ASSISTED LEFT  TOTAL KNEE REPLACEMENT. Anesthesia a combination of regional and general.  . Coronary angioplasty with stent placement  08/05/2012    LAD    History  Smoking status  . Never Smoker   Smokeless tobacco  . Never Used    History  Alcohol Use No    Family History  Problem Relation Age of Onset  . Leukemia Mother   . Heart Problems Father   . Heart Problems      family history    Review of Systems: The review of systems is per the HPI.  All other systems were reviewed and are negative.  Physical Exam: BP 110/70  Pulse 86  Ht 5\' 10"  (1.778 m)  Wt 276 lb 1.9 oz (125.247 kg)  BMI 39.62 kg/m2 Patient is very pleasant and in no acute distress. Skin is warm and dry. Color is normal.  HEENT is unremarkable. Normocephalic/atraumatic. PERRL. Sclera are nonicteric. Neck is supple. No masses. No JVD. Lungs are clear. Cardiac exam shows a regular rate and rhythm. Abdomen is soft. Extremities are without edema. Gait and ROM are intact. No gross neurologic deficits noted.  LABORATORY DATA: PENDING  Lab Results  Component Value Date   WBC 8.6 08/06/2012   HGB 16.1 08/06/2012   HCT 48.5 08/06/2012   PLT 185 08/06/2012   GLUCOSE 159* 08/06/2012   CHOL 213* 05/20/2012   TRIG 111 05/20/2012   HDL 37* 05/20/2012   LDLCALC 154* 05/20/2012   ALT 15 05/20/2012   AST 16 05/20/2012   NA 137 08/06/2012   K 3.5 08/06/2012   CL 102 08/06/2012   CREATININE 1.31 08/06/2012   BUN 19 08/06/2012   CO2 26 08/06/2012   TSH 0.785 05/20/2012   INR 1.1* 07/29/2012   HGBA1C 7.2* 05/20/2012    Coronary angiography:   Left mainstem: Short, no  significant disease.  Left anterior descending (LAD): There is a 90% proximal LAD stenosis between small 1st and 2nd diagonals.  Left circumflex (LCx): Large system. Moderate ramus with 30% proximal disease. LCx with 30% mid vessel stenosis.  Right coronary artery (RCA): Relatively small, codominant RCA. 40% mid RCA stenosis.  Left ventriculography: Not done. Recent echo with normal EF.   Final Conclusions: 90% proximal LAD stenosis. The patient has had significant exertional dyspnea for several weeks which appears to be his anginal equivalent. CCS class III angina (with mild exertion). He is on good doses of Coreg and amlodipine. I think that PCI is warranted in this situation for symptom management.  Major comorbidity is prostate cancer. Plan for probable seed implantation.  Recommendations: Patient will go to the main lab for PCI today. Will give Plavix 600 mg x 1.   [name]  08/05/2012, 1:32 PM   PCI data:  Conclusions: Successful PCI of the LAD  Recommendations: DAPT with aspirin and plavix for 12 months, then indefinite aspirin.  Tonny Bollman  08/05/2012, 10:51 PM   Assessment / Plan: 1. CAD with recent DES to the LAD - committed to Plavix for one year. Rechecking labs today. Overall, he is doing very well. I have sent a note in Epic about starting cardiac rehab. He seems quite motivated.   2. HLD - recheck lipids today. On Lipitor 40 mg  3. DM - followed by Dr. Tanya Nones  We will see him back in 3 months. Diet/exercise/weight loss strongly encouraged.   Patient is agreeable to this plan and will call if any problems develop in the interim.   Rosalio Macadamia, RN, ANP-C [facility] [address] [place], [place]  [address]

## 2012-08-20 NOTE — Patient Instructions (Signed)
We need to check labs today  Stay on your current medicines  Dr. Shirlee Latch will see you in 3 months  Walking every day is encouraged  Call the [facility] office at [phone] if you have any questions, problems or concerns.

## 2012-08-20 NOTE — Telephone Encounter (Signed)
New Problem:    Patient attempted to schedule a 3 MTH follow-up per his discharge from [facility] and Dr. Shirlee Latch did not have any availabilities in September.  When consulting with Lawson Fiscal I was instructed to send you a message that the patient needed to be seen in three months by Dr. Shirlee Latch.  Please call back.

## 2012-08-23 NOTE — Telephone Encounter (Signed)
Would suggest the first of October since he saw Lawson Fiscal 08/20/12 and he needs to be seen in 3 months.

## 2012-08-31 ENCOUNTER — Other Ambulatory Visit (INDEPENDENT_AMBULATORY_CARE_PROVIDER_SITE_OTHER): Payer: Medicare Other

## 2012-08-31 DIAGNOSIS — R0609 Other forms of dyspnea: Secondary | ICD-10-CM

## 2012-08-31 DIAGNOSIS — R0989 Other specified symptoms and signs involving the circulatory and respiratory systems: Secondary | ICD-10-CM

## 2012-08-31 LAB — LIPID PANEL
Cholesterol: 105 mg/dL (ref 0–200)
HDL: 30.4 mg/dL — ABNORMAL LOW (ref >39.00)
LDL Cholesterol: 54 mg/dL (ref 0–99)
Total CHOL/HDL Ratio: 3
Triglycerides: 102 mg/dL (ref 0.0–149.0)
VLDL: 20.4 mg/dL (ref 0.0–40.0)

## 2012-08-31 LAB — HEPATIC FUNCTION PANEL
ALT: 18 U/L (ref 0–53)
AST: 15 U/L (ref 0–37)
Albumin: 3.6 g/dL (ref 3.5–5.2)
Alkaline Phosphatase: 54 U/L (ref 39–117)
Bilirubin, Direct: 0.1 mg/dL (ref 0.0–0.3)
Total Bilirubin: 0.9 mg/dL (ref 0.3–1.2)
Total Protein: 6.9 g/dL (ref 6.0–8.3)

## 2012-09-08 ENCOUNTER — Encounter: Payer: Self-pay | Admitting: Radiation Oncology

## 2012-09-08 NOTE — Progress Notes (Signed)
GU Location of Tumor / Histology: prostate cancer, biopsy 07/15/12  If Prostate Cancer, Gleason Score is (3 + 3=6) and PSA is (8.54) and prostate volume is 145.38 cc  Patient presented with elevated PSA of 7.59 on 12/03/2011  Biopsies of prostate (if applicable) revealed: adenocarcinoma of the prostate  Past/Anticipated interventions by urology, if any: prostate biopsy  Past/Anticipated interventions by medical oncology, if any: none  Weight changes, if any: none  Bowel/Bladder complaints, if any: urinary frequency, urgency, weak urinary stream, IPSS 9  Nausea/Vomiting, if any: none  Pain issues, if any:  no  SAFETY ISSUES:  Prior radiation? no  Pacemaker/ICD? no  Possible current pregnancy? na  Is the patient on methotrexate? no  Current Complaints / other details:  Retired city Midwife, married, 4 children, 2 grandchildren, 15 step grandchildren, one 32 year old great grandson lives w/pt and his wife, IPSS 9

## 2012-09-09 ENCOUNTER — Ambulatory Visit
Admission: RE | Admit: 2012-09-09 | Discharge: 2012-09-09 | Disposition: A | Discharge status: Home / Self Care | Payer: Medicare Other | Source: Ambulatory Visit | Attending: Radiation Oncology | Admitting: Radiation Oncology

## 2012-09-09 ENCOUNTER — Encounter: Payer: Self-pay | Admitting: Radiation Oncology

## 2012-09-09 VITALS — BP 128/76 | HR 60 | Temp 98.7°F | Resp 20 | Ht 70.0 in | Wt 282.1 lb

## 2012-09-09 DIAGNOSIS — Z7982 Long term (current) use of aspirin: Secondary | ICD-10-CM | POA: Insufficient documentation

## 2012-09-09 DIAGNOSIS — C61 Malignant neoplasm of prostate: Secondary | ICD-10-CM | POA: Insufficient documentation

## 2012-09-09 DIAGNOSIS — Z79899 Other long term (current) drug therapy: Secondary | ICD-10-CM | POA: Insufficient documentation

## 2012-09-09 DIAGNOSIS — E119 Type 2 diabetes mellitus without complications: Secondary | ICD-10-CM | POA: Insufficient documentation

## 2012-09-09 DIAGNOSIS — I2510 Atherosclerotic heart disease of native coronary artery without angina pectoris: Secondary | ICD-10-CM | POA: Insufficient documentation

## 2012-09-09 DIAGNOSIS — Z96659 Presence of unspecified artificial knee joint: Secondary | ICD-10-CM | POA: Insufficient documentation

## 2012-09-09 DIAGNOSIS — Z9861 Coronary angioplasty status: Secondary | ICD-10-CM | POA: Insufficient documentation

## 2012-09-09 DIAGNOSIS — Z794 Long term (current) use of insulin: Secondary | ICD-10-CM | POA: Insufficient documentation

## 2012-09-09 DIAGNOSIS — I10 Essential (primary) hypertension: Secondary | ICD-10-CM | POA: Insufficient documentation

## 2012-09-09 DIAGNOSIS — N200 Calculus of kidney: Secondary | ICD-10-CM | POA: Insufficient documentation

## 2012-09-09 HISTORY — DX: Malignant neoplasm of prostate: C61

## 2012-09-09 NOTE — Progress Notes (Signed)
Please see the Nurse Progress Note in the MD Initial Consult Encounter for this patient. 

## 2012-09-09 NOTE — Progress Notes (Signed)
[facility] Radiation Oncology NEW PATIENT EVALUATION  Name: James Mcclain MRN: [id number]  Date:   09/09/2012           DOB: 22-Apr-1945  Status: outpatient   CC: Leo Grosser, MD  Lindaann Slough, MD    REFERRING PHYSICIAN: Lindaann Slough, MD   DIAGNOSIS: Stage TI C. favorable risk adenocarcinoma prostate   HISTORY OF PRESENT ILLNESS:  James Mcclain is a 67 y.o. male who is seen today through the courtesy of Dr. Brunilda Payor today for evaluation of his stage TI C. favorable risk adenocarcinoma prostate. He is being followed by Dr. Brunilda Payor for kidney stones. He was noted to have a rise in his PSA from 5.15 in February 2010 up to 8.4 this past April. He underwent ultrasound-guided biopsies on 07/15/2012 and was found to have 1 of 12 biopsies containing Gleason 6 (3+3) adenocarcinoma involving just 20% of one core. This was along the left mid gland. 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 His gland volume was found to be 145.4 cc. He is doing recently well from a GU and GI standpoint. His I PSS score is 9. He does have erectile dysfunction.  PREVIOUS RADIATION THERAPY: No   PAST MEDICAL HISTORY:  has a past medical history of Arthritis; Cholesterol serum elevated; Hypertension; Elevated PSA; Coronary artery disease (08/05/2012); Heart murmur; Shortness of breath; Prostate cancer (07/15/12); and Diabetes mellitus.     PAST SURGICAL HISTORY:  Past Surgical History  Procedure Laterality Date  . Kidney stones      2006  . Tonsillectomy      as adult  . Total knee arthroplasty  01/24/2011    Procedure: TOTAL KNEE ARTHROPLASTY;  Surgeon: Harvie Junior;  Location: [facility] OR;  Service: Orthopedics;  Laterality: Left;  COMPUTER ASSISTED LEFT  TOTAL KNEE REPLACEMENT. Anesthesia a combination of regional and general.  . Coronary angioplasty with stent placement  08/05/2012    LAD, 1 stent  . Prostate biopsy  07/15/2012     FAMILY HISTORY: family history includes Diabetes  in his sister; Heart Problems in his brother, father, and unspecified family member; and Leukemia in his mother. His mother died of leukemia at age 80 and his father died from cardiac disease and 75. No family history of prostate cancer.   SOCIAL HISTORY:  reports that he has never smoked. He has never used smokeless tobacco. He reports that he does not drink alcohol or use illicit drugs. 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   ALLERGIES: Fish allergy   MEDICATIONS:  Current Outpatient Prescriptions  Medication Sig Dispense Refill  . allopurinol (ZYLOPRIM) 100 MG tablet Take 2 tablets (200 mg total) by mouth daily.  30 tablet  6  . amLODipine (NORVASC) 10 MG tablet Take 10 mg by mouth daily.      [name] aspirin EC 81 MG tablet Take 1 tablet (81 mg total) by mouth daily.      [name] atorvastatin (LIPITOR) 40 MG tablet Take 1 tablet (40 mg total) by mouth daily.  30 tablet  3  . carvedilol (COREG) 12.5 MG tablet Take 1 tablet (12.5 mg total) by mouth 2 (two) times daily.  60 tablet  6  . Cholecalciferol (VITAMIN D3) 5000 UNITS CAPS Take 10,000 Units by mouth daily.      . clopidogrel (PLAVIX) 75 MG tablet Take 1 tablet (75 mg total) by mouth daily with breakfast.  30 tablet  11  . insulin glargine (LANTUS) 100  UNIT/ML injection Inject 33 Units into the skin at bedtime.       [name] losartan-hydrochlorothiazide (HYZAAR) 100-25 MG per tablet Take 1 tablet by mouth daily.  30 tablet  3  . pioglitazone (ACTOS) 45 MG tablet Take 45 mg by mouth daily.        [name] zinc gluconate 50 MG tablet Take 100 mg by mouth daily.        No current facility-administered medications for this encounter.     REVIEW OF SYSTEMS:  Pertinent items are noted in HPI.    PHYSICAL EXAM:  height is 5\' 10"  (1.778 m) and weight is 282 lb 1.6 oz (127.96 kg). His oral temperature is 98.7 F (37.1 C). His blood pressure is 128/76 and his pulse is 60. His respiration is 20.   Alert and oriented. Head and neck examination: Grossly  unremarkable. Nodes: Without palpable cervical or supraclavicular lymphadenopathy. Chest: Lungs clear. Heart: Regular rate and rhythm. Back: Without spinal or CVA tenderness. Abdomen: Without masses organomegaly. Genitalia: Unremarkable to inspection. Rectal: The prostate gland is markedly enlarged and I am unable to palpate the base of the gland. No induration or nodularity appreciated. Extremities: Without edema.   LABORATORY DATA:  Lab Results  Component Value Date   WBC 7.4 08/20/2012   HGB 15.8 08/20/2012   HCT 47.9 08/20/2012   MCV 95.9 08/20/2012   PLT 203.0 08/20/2012   Lab Results  Component Value Date   NA 140 08/20/2012   K 3.6 08/20/2012   CL 105 08/20/2012   CO2 27 08/20/2012   Lab Results  Component Value Date   ALT 18 08/31/2012   AST 15 08/31/2012   ALKPHOS 54 08/31/2012   BILITOT 0.9 08/31/2012   PSA 8.54 from 06/22/2012   IMPRESSION: Stage TI C. favorable risk adenocarcinoma prostate. I explained to the patient that his prognosis is related to his stage, PSA level, and Gleason score. All are favorable. In addition, he has low disease volume which is also in his favor. I'm sure that a significant portion of his PSA elevation is secondary to his large gland size. We discussed surgery, close surveillance/observation, and also radiation therapy. Radiation therapy options would be limited to external beam/IMRT since his gland is too large for a seed implant, even with downsizing. We discussed the potential acute and late toxicities of external beam/IMRT. I feel that the patient would be an ideal candidate for close surveillance with followup PSA determinations, and perhaps a repeat biopsy in 1-2 years. He is most interested in pursuing close surveillance/observation which I think would be an excellent choice for him.   PLAN: As discussed above.  I spent 40 minutes minutes face to face with the patient and more than 50% of that time was spent in counseling and/or coordination of care.

## 2012-09-21 ENCOUNTER — Telehealth: Payer: Self-pay | Admitting: Family Medicine

## 2012-09-21 MED ORDER — AMLODIPINE BESYLATE 10 MG PO TABS: 10.0000 mg | ORAL_TABLET | Freq: Every day | ORAL | Status: DC

## 2012-09-21 NOTE — Telephone Encounter (Signed)
Rx Refilled  

## 2012-09-25 ENCOUNTER — Other Ambulatory Visit: Payer: Self-pay | Admitting: Family Medicine

## 2012-09-27 NOTE — Telephone Encounter (Signed)
Medication refilled per protocol. 

## 2012-09-28 ENCOUNTER — Ambulatory Visit (INDEPENDENT_AMBULATORY_CARE_PROVIDER_SITE_OTHER): Payer: Medicare Other | Admitting: Family Medicine

## 2012-09-28 ENCOUNTER — Encounter: Payer: Self-pay | Admitting: Family Medicine

## 2012-09-28 VITALS — BP 130/74 | HR 60 | Temp 98.4°F | Resp 16 | Ht 69.5 in | Wt 284.0 lb

## 2012-09-28 DIAGNOSIS — I10 Essential (primary) hypertension: Secondary | ICD-10-CM

## 2012-09-28 DIAGNOSIS — E785 Hyperlipidemia, unspecified: Secondary | ICD-10-CM

## 2012-09-28 DIAGNOSIS — E119 Type 2 diabetes mellitus without complications: Secondary | ICD-10-CM

## 2012-09-28 LAB — COMPLETE METABOLIC PANEL WITH GFR
ALT: 17 U/L (ref 0–53)
AST: 16 U/L (ref 0–37)
Albumin: 3.8 g/dL (ref 3.5–5.2)
Alkaline Phosphatase: 65 U/L (ref 39–117)
BUN: 19 mg/dL (ref 6–23)
CO2: 30 mEq/L (ref 19–32)
Calcium: 9 mg/dL (ref 8.4–10.5)
Chloride: 101 mEq/L (ref 96–112)
Creat: 1.32 mg/dL (ref 0.50–1.35)
GFR, Est African American: 65 mL/min
GFR, Est Non African American: 56 mL/min — ABNORMAL LOW
Glucose, Bld: 192 mg/dL — ABNORMAL HIGH (ref 70–99)
Potassium: 3.9 mEq/L (ref 3.5–5.3)
Sodium: 139 mEq/L (ref 135–145)
Total Bilirubin: 0.7 mg/dL (ref 0.3–1.2)
Total Protein: 6.8 g/dL (ref 6.0–8.3)

## 2012-09-28 LAB — HEMOGLOBIN A1C
Hgb A1c MFr Bld: 6.9 % — ABNORMAL HIGH
Mean Plasma Glucose: 151 mg/dL — ABNORMAL HIGH

## 2012-09-28 LAB — MICROALBUMIN, URINE: Microalb, Ur: 5.85 mg/dL — ABNORMAL HIGH (ref 0.00–1.89)

## 2012-09-28 NOTE — Progress Notes (Signed)
Subjective:    Patient ID: James Mcclain, male    DOB: [birth year], 67 y.o.   MRN: [id number]  HPI Was recently found to have coronary artery disease requiring a drug-eluting stent in the left anterior descending artery. He is currently on aspirin and Plavix. I appreciate cardiology's excellent care.  Patient states he is doing well. He is here today for his diabetes follow up. He tried taking Lantus 33 units subcutaneous each bedtime. His fasting blood sugars in the morning are between 101 110. He is having some subjective hypoglycemic events in the morning.  He is not checking his 2 hour postprandial sugars. He has not had a diabetic eye exam. He denies peripheral neuropathy. He also has hypertension. He is currently on Norvasc 10 mg by mouth daily and Hyzaar 100/25 by mouth daily and Coreg 12.5 mg by mouth twice a day. He denies chest pain or dyspnea on exertion. He also has hyperlipidemia for which he takes Lipitor 40 mg by mouth daily. He denies myalgia or right upper quadrant pain.   Past Medical History  Diagnosis Date  . Arthritis   . Cholesterol serum elevated   . Hypertension   . Elevated PSA   . Coronary artery disease 08/05/2012    s/p Xience DES to the LAD  . Heart murmur   . Shortness of breath   . Prostate cancer 07/15/12  . Diabetes mellitus     x 8 years   Current Outpatient Prescriptions on File Prior to Visit  Medication Sig Dispense Refill  . allopurinol (ZYLOPRIM) 100 MG tablet TAKE TWO TABLETS BY MOUTH ONCE DAILY  30 tablet  4  . amLODipine (NORVASC) 10 MG tablet Take 1 tablet (10 mg total) by mouth daily.  30 tablet  5  . aspirin EC 81 MG tablet Take 1 tablet (81 mg total) by mouth daily.      [name] atorvastatin (LIPITOR) 40 MG tablet Take 1 tablet (40 mg total) by mouth daily.  30 tablet  3  . carvedilol (COREG) 12.5 MG tablet Take 1 tablet (12.5 mg total) by mouth 2 (two) times daily.  60 tablet  6  . Cholecalciferol (VITAMIN D3) 5000 UNITS CAPS Take 10,000 Units by mouth  daily.      . clopidogrel (PLAVIX) 75 MG tablet Take 1 tablet (75 mg total) by mouth daily with breakfast.  30 tablet  11  . insulin glargine (LANTUS) 100 UNIT/ML injection Inject 33 Units into the skin at bedtime.       [name] losartan-hydrochlorothiazide (HYZAAR) 100-25 MG per tablet Take 1 tablet by mouth daily.  30 tablet  3  . pioglitazone (ACTOS) 45 MG tablet Take 45 mg by mouth daily.        [name] zinc gluconate 50 MG tablet Take 100 mg by mouth daily.        No current facility-administered medications on file prior to visit.   Allergies  Allergen Reactions  . Fish Allergy Swelling   History   Social History  . Marital Status: Married    Spouse Name: N/A    Number of Children: N/A  . Years of Education: N/A   Occupational History  . Not on file.   Social History Main Topics  . Smoking status: Never Smoker   . Smokeless tobacco: Never Used  . Alcohol Use: No  . Drug Use: No  . Sexually Active: Not Currently   Other Topics Concern  . Not on file   Social History Narrative  .  No narrative on file   Family History  Problem Relation Age of Onset  . Leukemia Mother   . Heart Problems Father   . Heart Problems      family history  . Diabetes Sister   . Heart Problems Brother       Review of Systems  All other systems reviewed and are negative.       Objective:   Physical Exam  Vitals reviewed. Constitutional: He appears well-developed and well-nourished.  Eyes: Conjunctivae and EOM are normal. Pupils are equal, round, and reactive to light.  Neck: Neck supple. No JVD present. No thyromegaly present.  Cardiovascular: Normal rate, regular rhythm, normal heart sounds and intact distal pulses.  Exam reveals no gallop and no friction rub.   No murmur heard. Pulmonary/Chest: Effort normal and breath sounds normal. No respiratory distress. He has no wheezes. He has no rales.  Abdominal: Soft. Bowel sounds are normal. He exhibits no distension and no mass. There is no  tenderness. There is no rebound and no guarding.  Lymphadenopathy:    He has no cervical adenopathy.  Skin: Skin is warm. No rash noted. No erythema. No pallor.          Assessment & Plan:  1. Type II or unspecified type diabetes mellitus without mention of complication, not stated as uncontrolled Check hemoglobin A1c. I recommended a diabetic eye exam. I had a long discussion with the patient about discontinuing Actos and up titrating his insulin to maintain glycemic control. The patient would like to consider this after he sees the results of his hemoglobin A1c. - COMPLETE METABOLIC PANEL WITH GFR - Hemoglobin A1c - Microalbumin, urine  2. Other and unspecified hyperlipidemia Goal LDL is less than 70. This is due to be rechecked in 3 months.  3. HTN (hypertension) Blood pressure is well controlled. Continue present medications at their current dosages.[name]

## 2012-10-04 ENCOUNTER — Telehealth: Payer: Self-pay | Admitting: Family Medicine

## 2012-10-04 MED ORDER — INSULIN GLARGINE 100 UNIT/ML ~~LOC~~ SOLN: 33.0000 [IU] | Freq: Every day | SUBCUTANEOUS | Status: DC

## 2012-10-04 NOTE — Telephone Encounter (Signed)
Rx Refilled  

## 2012-10-25 ENCOUNTER — Other Ambulatory Visit: Payer: Self-pay | Admitting: Cardiology

## 2012-10-29 ENCOUNTER — Encounter: Payer: Self-pay | Admitting: Family Medicine

## 2012-10-29 ENCOUNTER — Ambulatory Visit (INDEPENDENT_AMBULATORY_CARE_PROVIDER_SITE_OTHER): Payer: Medicare Other | Admitting: Family Medicine

## 2012-10-29 ENCOUNTER — Telehealth: Payer: Self-pay | Admitting: Family Medicine

## 2012-10-29 VITALS — BP 100/68 | HR 92 | Temp 97.9°F | Resp 16 | Wt 276.0 lb

## 2012-10-29 DIAGNOSIS — R42 Dizziness and giddiness: Secondary | ICD-10-CM

## 2012-10-29 DIAGNOSIS — H6122 Impacted cerumen, left ear: Secondary | ICD-10-CM

## 2012-10-29 DIAGNOSIS — H6120 Impacted cerumen, unspecified ear: Secondary | ICD-10-CM

## 2012-10-29 NOTE — Progress Notes (Signed)
Subjective:    Patient ID: James Mcclain, male    DOB: Jun 06, 1945, 67 y.o.   MRN: [id number]  HPI Patient reports increasing dizziness over the last week. He states whenever he stands he feels lightheaded for a few seconds. He states whenever he sits up he feels lightheaded for a few seconds. He denies any true vertigo. He denies any presyncope or syncope. He denies any nausea or vomiting. He denies any headaches, ear pain, sinus pain. He does complain that his left ear feels stopped up and he cannot hear out of his left ear.   Past Medical History  Diagnosis Date  . Arthritis   . Cholesterol serum elevated   . Hypertension   . Elevated PSA   . Coronary artery disease 08/05/2012    s/p Xience DES to the LAD  . Heart murmur   . Shortness of breath   . Prostate cancer 07/15/12  . Diabetes mellitus     x 8 years   Past Surgical History  Procedure Laterality Date  . Kidney stones      2006  . Tonsillectomy      as adult  . Total knee arthroplasty  01/24/2011    Procedure: TOTAL KNEE ARTHROPLASTY;  Surgeon: Harvie Junior;  Location: [facility];  Service: Orthopedics;  Laterality: Left;  COMPUTER ASSISTED LEFT  TOTAL KNEE REPLACEMENT. Anesthesia a combination of regional and general.  . Coronary angioplasty with stent placement  08/05/2012    LAD, 1 stent  . Prostate biopsy  07/15/2012   Current Outpatient Prescriptions on File Prior to Visit  Medication Sig Dispense Refill  . allopurinol (ZYLOPRIM) 100 MG tablet TAKE TWO TABLETS BY MOUTH ONCE DAILY  30 tablet  4  . amLODipine (NORVASC) 10 MG tablet Take 1 tablet (10 mg total) by mouth daily.  30 tablet  5  . aspirin EC 81 MG tablet Take 1 tablet (81 mg total) by mouth daily.      [name] atorvastatin (LIPITOR) 40 MG tablet TAKE ONE TABLET BY MOUTH ONCE DAILY  30 tablet  0  . carvedilol (COREG) 12.5 MG tablet Take 1 tablet (12.5 mg total) by mouth 2 (two) times daily.  60 tablet  6  . Cholecalciferol (VITAMIN D3) 5000 UNITS CAPS Take 10,000 Units  by mouth daily.      . clopidogrel (PLAVIX) 75 MG tablet Take 1 tablet (75 mg total) by mouth daily with breakfast.  30 tablet  11  . losartan-hydrochlorothiazide (HYZAAR) 100-25 MG per tablet Take 1 tablet by mouth daily.  30 tablet  3  . zinc gluconate 50 MG tablet Take 100 mg by mouth daily.        No current facility-administered medications on file prior to visit.   Allergies  Allergen Reactions  . Fish Allergy Swelling   History   Social History  . Marital Status: Married    Spouse Name: N/A    Number of Children: N/A  . Years of Education: N/A   Occupational History  . Not on file.   Social History Main Topics  . Smoking status: Never Smoker   . Smokeless tobacco: Never Used  . Alcohol Use: No  . Drug Use: No  . Sexual Activity: Not Currently   Other Topics Concern  . Not on file   Social History Narrative  . No narrative on file      Review of Systems  All other systems reviewed and are negative.  Objective:   Physical Exam  Vitals reviewed. Constitutional: He is oriented to person, place, and time. He appears well-developed and well-nourished.  HENT:  Head: Normocephalic.  Right Ear: Tympanic membrane, external ear and ear canal normal.  Left Ear: A foreign body is present. Decreased hearing is noted.  Nose: Nose normal.  Mouth/Throat: Oropharynx is clear and moist. No oropharyngeal exudate.  Eyes: Conjunctivae are normal. Pupils are equal, round, and reactive to light. Right eye exhibits no discharge. Left eye exhibits no discharge. No scleral icterus.  Neck: Neck supple. No JVD present. No thyromegaly present.  Cardiovascular: Normal rate, regular rhythm, normal heart sounds and intact distal pulses.  Exam reveals no gallop and no friction rub.   No murmur heard. Pulmonary/Chest: Effort normal and breath sounds normal. No respiratory distress. He has no wheezes. He has no rales.  Abdominal: Soft. Bowel sounds are normal. He exhibits no  distension. There is no tenderness. There is no rebound and no guarding.  Musculoskeletal: Normal range of motion.  Lymphadenopathy:    He has no cervical adenopathy.  Neurological: He is alert and oriented to person, place, and time. He has normal reflexes. He displays normal reflexes. No cranial nerve deficit. He exhibits normal muscle tone. Coordination normal.  Skin: Skin is warm. No rash noted. No erythema. No pallor.   patient has a cerumen impaction in his left external auditory canal.  Dix-Hallpike maneuver is negative bilaterally.        Assessment & Plan:  1. Dizziness I believe the patient's dizziness is likely due to low blood pressure. His symptoms sound like orthostatic hypotension. I will have the patient to temporarily discontinue amlodipine and monitor his blood pressure closely at home. As long as his blood pressure is less than 140/90 we will continue to hold amlodipine. If his symptoms improve off amlodipine, no further workup is necessary.  2. Cerumen impaction, left Cerumen impaction was removed with combination of curette and ear lavage.

## 2012-11-01 ENCOUNTER — Telehealth: Payer: Self-pay | Admitting: Family Medicine

## 2012-11-01 MED ORDER — INSULIN PEN NEEDLE 32G X 4 MM MISC: Status: DC

## 2012-11-01 NOTE — Telephone Encounter (Signed)
Pt needs a refill on [facility] Fine Mini needles for insulin pens

## 2012-11-01 NOTE — Telephone Encounter (Signed)
Rx Refilled  

## 2012-11-13 ENCOUNTER — Other Ambulatory Visit: Payer: Self-pay | Admitting: Family Medicine

## 2012-11-23 ENCOUNTER — Other Ambulatory Visit: Payer: Self-pay | Admitting: Cardiology

## 2012-11-25 NOTE — Telephone Encounter (Signed)
closed

## 2012-12-16 ENCOUNTER — Other Ambulatory Visit: Payer: Self-pay | Admitting: Family Medicine

## 2012-12-17 ENCOUNTER — Encounter: Payer: Self-pay | Admitting: Cardiology

## 2012-12-17 ENCOUNTER — Ambulatory Visit: Payer: Medicare Other | Admitting: Cardiology

## 2012-12-17 ENCOUNTER — Ambulatory Visit (INDEPENDENT_AMBULATORY_CARE_PROVIDER_SITE_OTHER): Payer: Medicare Other | Admitting: Cardiology

## 2012-12-17 VITALS — BP 136/90 | HR 92 | Ht 70.0 in | Wt 272.0 lb

## 2012-12-17 DIAGNOSIS — I10 Essential (primary) hypertension: Secondary | ICD-10-CM

## 2012-12-17 DIAGNOSIS — E789 Disorder of lipoprotein metabolism, unspecified: Secondary | ICD-10-CM

## 2012-12-17 DIAGNOSIS — R011 Cardiac murmur, unspecified: Secondary | ICD-10-CM

## 2012-12-17 DIAGNOSIS — I2510 Atherosclerotic heart disease of native coronary artery without angina pectoris: Secondary | ICD-10-CM

## 2012-12-17 NOTE — Progress Notes (Signed)
Patient ID: James Mcclain, male   DOB: [birth year], 67 y.o.   MRN: [id number] PCP: Dr. Tanya Nones  67 yo with history of type II diabetes, HTN, CKD and CAD s/p recent PCI presents for cardiology followup. At initial visit, he reported significant new exertional dyspnea.  I set him up for ETT-Sestamibi. This showed a hypertensive BP response with extreme dyspnea.  There was a fixed inferior defect with EF 56%.  Echo showed moderate LVH with EF 55-60%.  Patient continues to have the same severe dyspnea with exertion.  Given ongoing symptoms, I did a left heart cath which showed 90% proximal LAD stenosis.  This was treated with Xience DES.    Since PCI, he has improved.  No further exertional dyspnea.  No chest pain.  He has not been able to start cardiac rehab because his wife has been sick but he would like to eventually enroll.  He had some trouble with dizziness in the summer which ultimately may have been vertigo.  BP is controlled at this time.  No lightheadedness.    Labs (3/14): K 4.5, creatinine 1.47, TSH normal, HCT 51.2, LDL 154, HDL 37 Labs (6/14): K 3.6, creatinine 1.4 Labs (7/14): LDL 54, HDL 30  PMH: 1. HTN 2. Type II diabetes 3. Gout 4. OA s/p TKR 5. CKD: Suspect diabetic nephropathy 6. Hyperlipidemia 7. Prostate cancer: Diagnosed 5/14.  8. CAD: ETT-Sestamibi with hypertensive BP response, severe dyspnea, inferior fixed defect with EF 56%.  Echo with moderate LVH, EF 55-60%.  LHC (6/14) with 90% pLAD stenosis; this was treated with Xience DES.    SH: Married, retired Midwife, lives in [place].  Nonsmoker.  FH: Brother with CHF, father with "enlarged heart"  Current Outpatient Prescriptions  Medication Sig Dispense Refill  . allopurinol (ZYLOPRIM) 100 MG tablet TAKE TWO TABLETS BY MOUTH ONCE DAILY  30 tablet  0  . amLODipine (NORVASC) 10 MG tablet Take 1 tablet (10 mg total) by mouth daily.  30 tablet  5  . aspirin EC 81 MG tablet Take 1 tablet (81 mg total) by mouth daily.       [name] atorvastatin (LIPITOR) 40 MG tablet TAKE ONE TABLET BY MOUTH ONCE DAILY  30 tablet  0  . carvedilol (COREG) 12.5 MG tablet Take 1 tablet (12.5 mg total) by mouth 2 (two) times daily.  60 tablet  6  . Cholecalciferol (VITAMIN D3) 5000 UNITS CAPS Take 10,000 Units by mouth daily.      . clopidogrel (PLAVIX) 75 MG tablet Take 1 tablet (75 mg total) by mouth daily with breakfast.  30 tablet  11  . insulin glargine (LANTUS) 100 units/mL SOLN Inject 32 Units into the skin at bedtime.      . Insulin Pen Needle 32G X 4 MM MISC As directed  100 each  5  . losartan-hydrochlorothiazide (HYZAAR) 100-25 MG per tablet TAKE ONE TABLET BY MOUTH ONCE DAILY  30 tablet  1  . zinc gluconate 50 MG tablet Take 100 mg by mouth daily.        No current facility-administered medications for this visit.   BP 136/90  Pulse 92  Ht 5\' 10"  (1.778 m)  Wt 123.378 kg (272 lb)  BMI 39.03 kg/m2 General: NAD, obese Neck: No JVD, no thyromegaly or thyroid nodule.  Lungs: Clear to auscultation bilaterally with normal respiratory effort. CV: Nondisplaced PMI.  Heart regular S1/S2, +S4, 1/6 early SEM RUSB.  No peripheral edema.  No carotid bruit.  Normal pedal  pulses.  Abdomen: Soft, nontender, no hepatosplenomegaly, no distention.  Neurologic: Alert and oriented x 3.  Psych: Normal affect. Extremities: No clubbing or cyanosis.   Assessment/Plan: 1. CAD: s/p PCI to proximal LAD.  He is doing well with resolution of exertional dyspnea.  He will continue ASA 81, atorvastatin, Coreg, and ARB.  He will continue Plavix for a total of 1 year.  He will start cardiac rehab when his wife is doing better.  2. HTN: BP well-controlled, continue current regimen.  3. Hyperlipidemia: Good lipids when checked in 7/14.  Continue statin.     4. Murmur: Aortic area systolic murmur.  No significant valvular lesion on echo.     [name] 12/17/2012

## 2012-12-17 NOTE — Patient Instructions (Signed)
Your physician wants you to follow-up in: 6 months with Dr McLean. (April 2015). You will receive a reminder letter in the mail two months in advance. If you don't receive a letter, please call our office to schedule the follow-up appointment.  

## 2012-12-30 ENCOUNTER — Other Ambulatory Visit: Payer: Self-pay | Admitting: Cardiology

## 2013-01-14 ENCOUNTER — Other Ambulatory Visit: Payer: Self-pay | Admitting: Family Medicine

## 2013-03-09 ENCOUNTER — Other Ambulatory Visit: Payer: Self-pay | Admitting: Family Medicine

## 2013-04-15 ENCOUNTER — Encounter: Payer: Self-pay | Admitting: Family Medicine

## 2013-04-15 ENCOUNTER — Other Ambulatory Visit: Payer: Self-pay | Admitting: Family Medicine

## 2013-04-15 DIAGNOSIS — I10 Essential (primary) hypertension: Secondary | ICD-10-CM

## 2013-04-15 NOTE — Telephone Encounter (Signed)
Medication refilled per protocol. Letter sent for 6 mth f/u reminder

## 2013-04-28 ENCOUNTER — Encounter: Payer: Self-pay | Admitting: Family Medicine

## 2013-04-28 ENCOUNTER — Ambulatory Visit (INDEPENDENT_AMBULATORY_CARE_PROVIDER_SITE_OTHER): Payer: Medicare Other | Admitting: Family Medicine

## 2013-04-28 VITALS — BP 160/80 | HR 68 | Temp 98.0°F | Resp 18 | Ht 70.0 in | Wt 288.0 lb

## 2013-04-28 DIAGNOSIS — I10 Essential (primary) hypertension: Secondary | ICD-10-CM

## 2013-04-28 DIAGNOSIS — E785 Hyperlipidemia, unspecified: Secondary | ICD-10-CM

## 2013-04-28 DIAGNOSIS — IMO0001 Reserved for inherently not codable concepts without codable children: Secondary | ICD-10-CM

## 2013-04-28 DIAGNOSIS — E1165 Type 2 diabetes mellitus with hyperglycemia: Principal | ICD-10-CM

## 2013-04-28 LAB — CBC WITH DIFFERENTIAL/PLATELET
Basophils Absolute: 0 10*3/uL (ref 0.0–0.1)
Basophils Relative: 0 % (ref 0–1)
Eosinophils Absolute: 0.2 10*3/uL (ref 0.0–0.7)
Eosinophils Relative: 4 % (ref 0–5)
HCT: 51.3 % (ref 39.0–52.0)
Hemoglobin: 16.9 g/dL (ref 13.0–17.0)
Lymphocytes Relative: 32 % (ref 12–46)
Lymphs Abs: 1.8 10*3/uL (ref 0.7–4.0)
MCH: 31.3 pg (ref 26.0–34.0)
MCHC: 32.9 g/dL (ref 30.0–36.0)
MCV: 95 fL (ref 78.0–100.0)
Monocytes Absolute: 0.6 10*3/uL (ref 0.1–1.0)
Monocytes Relative: 10 % (ref 3–12)
Neutro Abs: 3 10*3/uL (ref 1.7–7.7)
Neutrophils Relative %: 54 % (ref 43–77)
Platelets: 212 10*3/uL (ref 150–400)
RBC: 5.4 MIL/uL (ref 4.22–5.81)
RDW: 15.1 % (ref 11.5–15.5)
WBC: 5.6 10*3/uL (ref 4.0–10.5)

## 2013-04-28 LAB — LIPID PANEL
Cholesterol: 130 mg/dL (ref 0–200)
HDL: 38 mg/dL — ABNORMAL LOW (ref >39)
LDL Cholesterol: 79 mg/dL (ref 0–99)
Total CHOL/HDL Ratio: 3.4 Ratio
Triglycerides: 67 mg/dL (ref <150)
VLDL: 13 mg/dL (ref 0–40)

## 2013-04-28 LAB — COMPLETE METABOLIC PANEL WITH GFR
ALT: 14 U/L (ref 0–53)
AST: 15 U/L (ref 0–37)
Albumin: 3.8 g/dL (ref 3.5–5.2)
Alkaline Phosphatase: 64 U/L (ref 39–117)
BUN: 23 mg/dL (ref 6–23)
CO2: 31 mEq/L (ref 19–32)
Calcium: 9.2 mg/dL (ref 8.4–10.5)
Chloride: 102 mEq/L (ref 96–112)
Creat: 1.42 mg/dL — ABNORMAL HIGH (ref 0.50–1.35)
GFR, Est African American: 59 mL/min — ABNORMAL LOW
GFR, Est Non African American: 51 mL/min — ABNORMAL LOW
Glucose, Bld: 81 mg/dL (ref 70–99)
Potassium: 4.5 mEq/L (ref 3.5–5.3)
Sodium: 142 mEq/L (ref 135–145)
Total Bilirubin: 0.7 mg/dL (ref 0.2–1.2)
Total Protein: 7.1 g/dL (ref 6.0–8.3)

## 2013-04-28 LAB — HEMOGLOBIN A1C
Hgb A1c MFr Bld: 8.2 % — ABNORMAL HIGH (ref <5.7)
Mean Plasma Glucose: 189 mg/dL — ABNORMAL HIGH (ref <117)

## 2013-04-28 MED ORDER — METFORMIN HCL 1000 MG PO TABS: 1000.0000 mg | ORAL_TABLET | Freq: Two times a day (BID) | ORAL | Status: DC

## 2013-04-28 NOTE — Progress Notes (Signed)
Subjective:    Patient ID: James Mcclain, male    DOB: 07-11-1945, 68 y.o.   MRN: [id number]  HPI Patient is here today for followup of his hypertension, hyperlipidemia, and diabetes mellitus type 2. He is not routinely checking his sugars. He continues to gain weight and retaining fluid on Actos. He also has a history of coronary artery disease. His blood pressure is extremely elevated today at 160/80 although he states his blood pressures much better at home. He denies any polyuria, polydipsia, protrusion. He is interested in making changes to his medication to help facilitate weight loss. Past Medical History  Diagnosis Date  . Arthritis   . Cholesterol serum elevated   . Hypertension   . Elevated PSA   . Coronary artery disease 08/05/2012    s/p Xience DES to the LAD  . Heart murmur   . Shortness of breath   . Prostate cancer 07/15/12  . Diabetes mellitus     x 8 years   Current Outpatient Prescriptions on File Prior to Visit  Medication Sig Dispense Refill  . allopurinol (ZYLOPRIM) 100 MG tablet TAKE TWO TABLETS BY MOUTH ONCE DAILY  30 tablet  5  . amLODipine (NORVASC) 10 MG tablet TAKE ONE TABLET BY MOUTH ONCE DAILY  30 tablet  1  . aspirin EC 81 MG tablet Take 1 tablet (81 mg total) by mouth daily.      [name] atorvastatin (LIPITOR) 40 MG tablet TAKE ONE TABLET BY MOUTH ONCE DAILY  30 tablet  3  . carvedilol (COREG) 12.5 MG tablet Take 1 tablet (12.5 mg total) by mouth 2 (two) times daily.  60 tablet  6  . Cholecalciferol (VITAMIN D3) 5000 UNITS CAPS Take 10,000 Units by mouth daily.      . clopidogrel (PLAVIX) 75 MG tablet Take 1 tablet (75 mg total) by mouth daily with breakfast.  30 tablet  11  . insulin glargine (LANTUS) 100 units/mL SOLN Inject 32 Units into the skin at bedtime.      . Insulin Pen Needle 32G X 4 MM MISC As directed  100 each  5  . losartan-hydrochlorothiazide (HYZAAR) 100-25 MG per tablet TAKE ONE TABLET BY MOUTH ONCE DAILY  30 tablet  3  . zinc gluconate 50 MG  tablet Take 100 mg by mouth daily.        No current facility-administered medications on file prior to visit.   Past Surgical History  Procedure Laterality Date  . Kidney stones      2006  . Tonsillectomy      as adult  . Total knee arthroplasty  01/24/2011    Procedure: TOTAL KNEE ARTHROPLASTY;  Surgeon: Alta Corning;  Location: [facility];  Service: Orthopedics;  Laterality: Left;  COMPUTER ASSISTED LEFT  TOTAL KNEE REPLACEMENT. Anesthesia a combination of regional and general.  . Coronary angioplasty with stent placement  08/05/2012    LAD, 1 stent  . Prostate biopsy  07/15/2012   Allergies  Allergen Reactions  . Fish Allergy Swelling   History   Social History  . Marital Status: Married    Spouse Name: N/A    Number of Children: N/A  . Years of Education: N/A   Occupational History  . Not on file.   Social History Main Topics  . Smoking status: Never Smoker   . Smokeless tobacco: Never Used  . Alcohol Use: No  . Drug Use: No  . Sexual Activity: Not Currently   Other Topics Concern  .  Not on file   Social History Narrative  . No narrative on file      Review of Systems  All other systems reviewed and are negative.       Objective:   Physical Exam  Vitals reviewed. Neck: Neck supple.  Cardiovascular: Normal rate, regular rhythm and normal heart sounds.   No murmur heard. Pulmonary/Chest: Effort normal and breath sounds normal. No respiratory distress. He has no wheezes. He has no rales.  Abdominal: Soft. Bowel sounds are normal. He exhibits no distension. There is no tenderness. There is no rebound.  Musculoskeletal: He exhibits edema.  Lymphadenopathy:    He has no cervical adenopathy.          Assessment & Plan:  Type II or unspecified type diabetes mellitus without mention of complication, uncontrolled - Plan: COMPLETE METABOLIC PANEL WITH GFR, CBC with Differential, Hemoglobin A1c, Lipid panel  HTN (hypertension)  HLD (hyperlipidemia)   patient blood pressures extremely elevated. He like to have his blood pressure checked 2 or 3 times over the next week because he is convinced that it is much better at home. It is consistently high in the next 2 or 3 checks we will add doxazosin manage his blood pressure.  Given his history of coronary artery disease, his edema, and his weight gain, the patient like to discontinue Actos and replace it with metformin 1000 mg by mouth twice a day. If he also has an elevated hemoglobin A1c I would also suggest adding invokana to facilitate weight loss. Acid check a fasting lipid panel. LDL is less than 70.  I also recommended the patient receive Prevnar 13.

## 2013-05-02 ENCOUNTER — Other Ambulatory Visit: Payer: Self-pay | Admitting: Cardiology

## 2013-05-15 ENCOUNTER — Other Ambulatory Visit: Payer: Self-pay | Admitting: Family Medicine

## 2013-05-16 NOTE — Telephone Encounter (Signed)
Medication refilled per protocol. 

## 2013-05-18 LAB — HM DIABETES EYE EXAM

## 2013-06-04 ENCOUNTER — Other Ambulatory Visit: Payer: Self-pay | Admitting: Cardiology

## 2013-06-14 ENCOUNTER — Other Ambulatory Visit: Payer: Self-pay | Admitting: Family Medicine

## 2013-06-16 ENCOUNTER — Ambulatory Visit (INDEPENDENT_AMBULATORY_CARE_PROVIDER_SITE_OTHER): Payer: Medicare Other | Admitting: Cardiology

## 2013-06-16 ENCOUNTER — Encounter: Payer: Self-pay | Admitting: Cardiology

## 2013-06-16 VITALS — BP 142/84 | HR 71 | Ht 70.0 in | Wt 279.0 lb

## 2013-06-16 DIAGNOSIS — I2510 Atherosclerotic heart disease of native coronary artery without angina pectoris: Secondary | ICD-10-CM

## 2013-06-16 DIAGNOSIS — I10 Essential (primary) hypertension: Secondary | ICD-10-CM

## 2013-06-16 DIAGNOSIS — C61 Malignant neoplasm of prostate: Secondary | ICD-10-CM

## 2013-06-16 MED ORDER — CARVEDILOL 6.25 MG PO TABS: 6.2500 mg | ORAL_TABLET | Freq: Two times a day (BID) | ORAL | Status: DC

## 2013-06-16 MED ORDER — CARVEDILOL 12.5 MG PO TABS: 12.5000 mg | ORAL_TABLET | Freq: Two times a day (BID) | ORAL | Status: DC

## 2013-06-16 NOTE — Patient Instructions (Signed)
**Note De-Identified Zipporah Finamore Obfuscation** Your physician has recommended you make the following change in your medication: increase Coreg to 18.75 mg twice daily  Your physician wants you to follow-up in: 6 months. You will receive a reminder letter in the mail two months in advance. If you don't receive a letter, please call our office to schedule the follow-up appointment.  Please talk with your wife and if she says that you do snore while sleeping call Dr Oleh Genin nurse as at [phone] to let us know.

## 2013-06-17 NOTE — Progress Notes (Signed)
Patient ID: James Mcclain, male   DOB: September 07, 1945, 68 y.o.   MRN: [id number] PCP: Dr. Dennard Schaumann  68 yo with history of type II diabetes, HTN, CKD and CAD s/p recent PCI presents for cardiology followup. At initial visit, he reported significant new exertional dyspnea.  I set him up for ETT-Sestamibi. This showed a hypertensive BP response with extreme dyspnea.  There was a fixed inferior defect with EF 56%.  Echo showed moderate LVH with EF 55-60%.  Patient continues to have the same severe dyspnea with exertion.  Given ongoing symptoms, I did a left heart cath which showed 90% proximal LAD stenosis.  This was treated with Xience DES.    Since PCI, he has improved.  No significant exertional dyspnea (mild dyspnea walking up a hill).  No chest pain.  He is playing golf about once a month.  He has not been able to start cardiac rehab because his wife has been sick and he is taking care of his grandson.  He would like to eventually enroll.  BP continues to run a bit high.  He has mild occasional daytime sleepiness, not sure how much he snores.   Labs (3/14): K 4.5, creatinine 1.47, TSH normal, HCT 51.2, LDL 154, HDL 37 Labs (6/14): K 3.6, creatinine 1.4 Labs (7/14): LDL 54, HDL 30 Labs (3/15): K 4.5, creatinine 1.42, HCT 51.3, LDL 79, HDL 38  ECG: NSR, left axis deviation, inferior Qs  PMH: 1. HTN 2. Type II diabetes 3. Gout 4. OA s/p TKR 5. CKD: Suspect diabetic nephropathy 6. Hyperlipidemia 7. Prostate cancer: Diagnosed 5/14.  8. CAD: Echo (5/14) with EF 55-60%, moderate LVH.  ETT-Sestamibi with hypertensive BP response, severe dyspnea, inferior fixed defect with EF 56%.  Echo with moderate LVH, EF 55-60%.  LHC (6/14) with 90% pLAD stenosis; this was treated with Xience DES.    SH: Married, retired Recruitment consultant, lives in [place].  Nonsmoker.  FH: Brother with CHF, father with "enlarged heart"  ROS: All systems reviewed and negative except as per HPI.   Current Outpatient Prescriptions   Medication Sig Dispense Refill  . allopurinol (ZYLOPRIM) 100 MG tablet TAKE TWO TABLETS BY MOUTH ONCE DAILY  30 tablet  11  . amLODipine (NORVASC) 10 MG tablet TAKE ONE TABLET BY MOUTH ONCE DAILY  30 tablet  11  . aspirin EC 81 MG tablet Take 1 tablet (81 mg total) by mouth daily.      [name] atorvastatin (LIPITOR) 40 MG tablet TAKE ONE TABLET BY MOUTH ONCE DAILY  30 tablet  0  . carvedilol (COREG) 6.25 MG tablet Take 1 tablet (6.25 mg total) by mouth 2 (two) times daily with a meal.  60 tablet  6  . Cholecalciferol (VITAMIN D3) 5000 UNITS CAPS Take 10,000 Units by mouth daily.      . clopidogrel (PLAVIX) 75 MG tablet Take 1 tablet (75 mg total) by mouth daily with breakfast.  30 tablet  11  . insulin glargine (LANTUS) 100 units/mL SOLN Inject 32 Units into the skin at bedtime.      . Insulin Pen Needle 32G X 4 MM MISC As directed  100 each  5  . losartan-hydrochlorothiazide (HYZAAR) 100-25 MG per tablet TAKE ONE TABLET BY MOUTH ONCE DAILY  30 tablet  5  . metFORMIN (GLUCOPHAGE) 1000 MG tablet Take 1 tablet (1,000 mg total) by mouth 2 (two) times daily with a meal.  180 tablet  3  . pioglitazone (ACTOS) 45 MG tablet Take 45  mg by mouth daily.      [name] zinc gluconate 50 MG tablet Take 100 mg by mouth daily.       . carvedilol (COREG) 12.5 MG tablet Take 1 tablet (12.5 mg total) by mouth 2 (two) times daily.  60 tablet  6   No current facility-administered medications for this visit.   BP 142/84  Pulse 71  Ht 5\' 10"  (1.778 m)  Wt 126.554 kg (279 lb)  BMI 40.03 kg/m2 General: NAD, obese Neck: No JVD, no thyromegaly or thyroid nodule.  Lungs: Clear to auscultation bilaterally with normal respiratory effort. CV: Nondisplaced PMI.  Heart regular S1/S2, +S4, 1/6 early SEM RUSB.  No peripheral edema.  No carotid bruit.  Normal pedal pulses.  Abdomen: Soft, nontender, no hepatosplenomegaly, no distention.  Neurologic: Alert and oriented x 3.  Psych: Normal affect. Extremities: No clubbing or  cyanosis.   Assessment/Plan: 1. CAD: s/p PCI to proximal LAD.  He is doing well with resolution of exertional dyspnea.  He will continue ASA 81, atorvastatin, Coreg, and ARB.  I will have him continue Plavix long-term if he has no bleeding complications.  He will start cardiac rehab when his wife is doing better.  2. HTN: BP still high.  Will have him increase Coreg to 18.75 mg bid.  He also has some symptoms that could be consistent with OSA (which could potentiate HTN).  Will have his wife watch his sleep, will tell us if he snores loudly/gasps/stops breathing in his sleep.  3. Hyperlipidemia: Good lipids when checked in 3/15.  Continue statin.     4. Murmur: Aortic area systolic murmur.  No significant valvular lesion on prior echo.    5. Prostate cancer: Patient needs prostate biopsy. Ideally, would continue Plavix to complete a year post-DES placement prior to stopping it for biopsy.  This would be the end of June.  If biopsy is urgent, could hold Plavix earlier than June for biopsy.    Larey Dresser 06/17/2013

## 2013-07-03 ENCOUNTER — Other Ambulatory Visit: Payer: Self-pay | Admitting: Cardiology

## 2013-08-08 ENCOUNTER — Other Ambulatory Visit: Payer: Self-pay

## 2013-08-08 MED ORDER — CLOPIDOGREL BISULFATE 75 MG PO TABS: 75.0000 mg | ORAL_TABLET | Freq: Every day | ORAL | Status: DC

## 2013-08-16 ENCOUNTER — Ambulatory Visit (INDEPENDENT_AMBULATORY_CARE_PROVIDER_SITE_OTHER): Payer: Medicare Other | Admitting: Family Medicine

## 2013-08-16 ENCOUNTER — Encounter: Payer: Self-pay | Admitting: Family Medicine

## 2013-08-16 VITALS — BP 130/70 | HR 80 | Temp 98.8°F | Resp 18 | Ht 70.0 in | Wt 275.0 lb

## 2013-08-16 DIAGNOSIS — IMO0001 Reserved for inherently not codable concepts without codable children: Secondary | ICD-10-CM

## 2013-08-16 DIAGNOSIS — E1165 Type 2 diabetes mellitus with hyperglycemia: Principal | ICD-10-CM

## 2013-08-16 DIAGNOSIS — Z0000 Encounter for general adult medical examination without abnormal findings: Secondary | ICD-10-CM

## 2013-08-16 LAB — HEMOGLOBIN A1C
Hgb A1c MFr Bld: 7.9 % — ABNORMAL HIGH (ref <5.7)
Mean Plasma Glucose: 180 mg/dL — ABNORMAL HIGH (ref <117)

## 2013-08-16 NOTE — Progress Notes (Signed)
Subjective:    Patient ID: James Mcclain, male    DOB: Jan 13, 1946, 68 y.o.   MRN: [id number]  HPI Subjective:   Patient presents for Medicare Annual/Subsequent preventive examination. Patient has no medical concerns. His PSA is followed by urology. They're waiting for the patient discontinue Plavix prior to proceeding with prostate biopsy. This was last discussed 2 months ago. I saw the patient last in March. At that time he had a fasting lipid panel which was within normal limits the go LDL below 70. He also had a CMP which is stable renal function. His CBC was excellent. However his hemoglobin A1c had risen dramatically to 8.2. Last few months the patient has been trying to watch his diet and be more consistent with aerobic exercise. He is here today to recheck his hemoglobin A1c. His last colonoscopy was in 2008. His Pneumovax is up to date. His tetanus vaccine is up-to-date. He is due for Zostavax  Review Past Medical/Family/Social: Past Medical History  Diagnosis Date  . Arthritis   . Cholesterol serum elevated   . Hypertension   . Elevated PSA   . Coronary artery disease 08/05/2012    s/p Xience DES to the LAD  . Heart murmur   . Shortness of breath   . Prostate cancer 07/15/12  . Diabetes mellitus     x 8 years   Past Surgical History  Procedure Laterality Date  . Kidney stones      2006  . Tonsillectomy      as adult  . Total knee arthroplasty  01/24/2011    Procedure: TOTAL KNEE ARTHROPLASTY;  Surgeon: Alta Corning;  Location: [facility];  Service: Orthopedics;  Laterality: Left;  COMPUTER ASSISTED LEFT  TOTAL KNEE REPLACEMENT. Anesthesia a combination of regional and general.  . Coronary angioplasty with stent placement  08/05/2012    LAD, 1 stent  . Prostate biopsy  07/15/2012   Current Outpatient Prescriptions on File Prior to Visit  Medication Sig Dispense Refill  . allopurinol (ZYLOPRIM) 100 MG tablet TAKE TWO TABLETS BY MOUTH ONCE DAILY  30 tablet  11  . amLODipine  (NORVASC) 10 MG tablet TAKE ONE TABLET BY MOUTH ONCE DAILY  30 tablet  11  . aspirin EC 81 MG tablet Take 1 tablet (81 mg total) by mouth daily.      [name] atorvastatin (LIPITOR) 40 MG tablet TAKE ONE TABLET BY MOUTH ONCE DAILY  30 tablet  3  . carvedilol (COREG) 12.5 MG tablet Take 1 tablet (12.5 mg total) by mouth 2 (two) times daily.  60 tablet  6  . carvedilol (COREG) 6.25 MG tablet Take 1 tablet (6.25 mg total) by mouth 2 (two) times daily with a meal.  60 tablet  6  . Cholecalciferol (VITAMIN D3) 5000 UNITS CAPS Take 10,000 Units by mouth daily.      . clopidogrel (PLAVIX) 75 MG tablet Take 1 tablet (75 mg total) by mouth daily with breakfast.  30 tablet  2  . insulin glargine (LANTUS) 100 units/mL SOLN Inject 35 Units into the skin at bedtime.       . Insulin Pen Needle 32G X 4 MM MISC As directed  100 each  5  . losartan-hydrochlorothiazide (HYZAAR) 100-25 MG per tablet TAKE ONE TABLET BY MOUTH ONCE DAILY  30 tablet  5  . metFORMIN (GLUCOPHAGE) 1000 MG tablet Take 1 tablet (1,000 mg total) by mouth 2 (two) times daily with a meal.  180 tablet  3  .  zinc gluconate 50 MG tablet Take 100 mg by mouth daily.        No current facility-administered medications on file prior to visit.   Allergies  Allergen Reactions  . Fish Allergy Swelling   History   Social History  . Marital Status: Married    Spouse Name: N/A    Number of Children: N/A  . Years of Education: N/A   Occupational History  . Not on file.   Social History Main Topics  . Smoking status: Never Smoker   . Smokeless tobacco: Never Used  . Alcohol Use: No  . Drug Use: No  . Sexual Activity: Not Currently   Other Topics Concern  . Not on file   Social History Narrative  . No narrative on file   Family History  Problem Relation Age of Onset  . Leukemia Mother   . Heart Problems Father   . Heart Problems      family history  . Diabetes Sister   . Heart Problems Brother      Risk Factors  Current exercise  habits:  Dietary issues discussed:   Cardiac risk factors: Obesity (BMI >= 30 kg/m2).   Depression Screen  (Note: if answer to either of the following is "Yes", a more complete depression screening is indicated)  Over the past two weeks, have you felt down, depressed or hopeless? No Over the past two weeks, have you felt little interest or pleasure in doing things? No Have you lost interest or pleasure in daily life? No Do you often feel hopeless? No Do you cry easily over simple problems? No   Activities of Daily Living  In your present state of health, do you have any difficulty performing the following activities?:  Driving? No  Managing money? No  Feeding yourself? No  Getting from bed to chair? No  Climbing a flight of stairs? No  Preparing food and eating?: No  Bathing or showering? No  Getting dressed: No  Getting to the toilet? No  Using the toilet:No  Moving around from place to place: No  In the past year have you fallen or had a near fall?:No  Are you sexually active? No  Do you have more than one partner? No   Hearing Difficulties: No  Do you often ask people to speak up or repeat themselves? No  Do you experience ringing or noises in your ears? No Do you have difficulty understanding soft or whispered voices? No  Do you feel that you have a problem with memory? No Do you often misplace items? No  Do you feel safe at home? Yes  Cognitive Testing  Alert? Yes Normal Appearance?Yes  Oriented to person? Yes Place? Yes  Time? Yes  Recall of three objects? Yes  Can perform simple calculations? Yes  Displays appropriate judgment?Yes  Can read the correct time from a watch face?Yes   Screening Tests / Date Colonoscopy    2008              Zostavax due Influenza Vaccine UTD Tetanus/tdap 2013 Pneumovax 2013     Review of Systems  All other systems reviewed and are negative.      Objective:   Physical Exam  Vitals reviewed. Constitutional: He is  oriented to person, place, and time. He appears well-developed and well-nourished. No distress.  HENT:  Head: Normocephalic and atraumatic.  Right Ear: External ear normal.  Left Ear: External ear normal.  Nose: Nose normal.  Mouth/Throat: Oropharynx is  clear and moist. No oropharyngeal exudate.  Eyes: Conjunctivae and EOM are normal. Pupils are equal, round, and reactive to light. Right eye exhibits no discharge. Left eye exhibits no discharge. No scleral icterus.  Neck: Normal range of motion. Neck supple. No JVD present. No tracheal deviation present. No thyromegaly present.  Cardiovascular: Normal rate, regular rhythm and normal heart sounds.  Exam reveals no gallop and no friction rub.   No murmur heard. Pulmonary/Chest: Effort normal and breath sounds normal. No stridor. No respiratory distress. He has no wheezes. He has no rales. He exhibits no tenderness.  Abdominal: Soft. Bowel sounds are normal. He exhibits no distension and no mass. There is no tenderness. There is no rebound and no guarding.  Musculoskeletal: Normal range of motion. He exhibits no edema and no tenderness.  Lymphadenopathy:    He has no cervical adenopathy.  Neurological: He is alert and oriented to person, place, and time. He has normal reflexes. He displays normal reflexes. No cranial nerve deficit. He exhibits normal muscle tone. Coordination normal.  Skin: Skin is warm. No rash noted. He is not diaphoretic. No erythema. No pallor.  Psychiatric: He has a normal mood and affect. His behavior is normal. Judgment and thought content normal.          Assessment & Plan:  1. Type II or unspecified type diabetes mellitus without mention of complication, uncontrolled On recheck the patient's hemoglobin A1c. His hemoglobin A1c is greater than 7, the patient is interested in trying Invokana to see if it also will help facilitate weight loss. Are the results of his hemoglobin A1c. - Hemoglobin A1c  2. Routine general  medical examination at a health care facility Have recommended 20 pounds of weight loss. I recommend this through diet and increasing aerobic exercise. His preventive care is up to date. Patient received Prevnar 13 today in the office. We discussed Zostavax.  Patient will call his insurance and inquire as to the cause prior to deciding about receiving Zostavax. For his PSA to his urologist. His colonoscopy is up to date. Screen negative for depression.  Medicare Attestation  I have personally reviewed:  The patient's medical and social history  Their use of alcohol, tobacco or illicit drugs  Their current medications and supplements  The patient's functional ability including ADLs,fall risks, home safety risks, cognitive, and hearing and visual impairment  Diet and physical activities  Evidence for depression or mood disorders  The patient's weight, height, BMI, and visual acuity have been recorded in the chart. I have made referrals, counseling, and provided education to the patient based on review of the above and I have provided the patient with a written personalized care plan for preventive services.

## 2013-08-18 ENCOUNTER — Other Ambulatory Visit: Payer: Self-pay | Admitting: *Deleted

## 2013-08-18 MED ORDER — CANAGLIFLOZIN 300 MG PO TABS: 300.0000 mg | ORAL_TABLET | Freq: Every day | ORAL | Status: DC

## 2013-09-23 ENCOUNTER — Encounter (HOSPITAL_COMMUNITY): Payer: Self-pay | Admitting: *Deleted

## 2013-09-23 ENCOUNTER — Inpatient Hospital Stay (HOSPITAL_COMMUNITY)
Admission: EM | Admit: 2013-09-23 | Discharge: 2013-09-28 | DRG: 061 | Disposition: A | Discharge status: Home / Self Care | Payer: Medicare Other | Attending: Neurology | Admitting: Neurology

## 2013-09-23 ENCOUNTER — Emergency Department (HOSPITAL_COMMUNITY): Payer: Medicare Other

## 2013-09-23 DIAGNOSIS — E785 Hyperlipidemia, unspecified: Secondary | ICD-10-CM | POA: Diagnosis present

## 2013-09-23 DIAGNOSIS — M129 Arthropathy, unspecified: Secondary | ICD-10-CM | POA: Diagnosis present

## 2013-09-23 DIAGNOSIS — R4701 Aphasia: Secondary | ICD-10-CM | POA: Diagnosis present

## 2013-09-23 DIAGNOSIS — Z7902 Long term (current) use of antithrombotics/antiplatelets: Secondary | ICD-10-CM | POA: Diagnosis not present

## 2013-09-23 DIAGNOSIS — I6340 Cerebral infarction due to embolism of unspecified cerebral artery: Secondary | ICD-10-CM

## 2013-09-23 DIAGNOSIS — I619 Nontraumatic intracerebral hemorrhage, unspecified: Secondary | ICD-10-CM | POA: Diagnosis present

## 2013-09-23 DIAGNOSIS — I10 Essential (primary) hypertension: Secondary | ICD-10-CM

## 2013-09-23 DIAGNOSIS — N189 Chronic kidney disease, unspecified: Secondary | ICD-10-CM | POA: Diagnosis present

## 2013-09-23 DIAGNOSIS — Z96659 Presence of unspecified artificial knee joint: Secondary | ICD-10-CM | POA: Diagnosis not present

## 2013-09-23 DIAGNOSIS — C61 Malignant neoplasm of prostate: Secondary | ICD-10-CM | POA: Diagnosis present

## 2013-09-23 DIAGNOSIS — I6350 Cerebral infarction due to unspecified occlusion or stenosis of unspecified cerebral artery: Principal | ICD-10-CM | POA: Diagnosis present

## 2013-09-23 DIAGNOSIS — I63231 Cerebral infarction due to unspecified occlusion or stenosis of right carotid arteries: Secondary | ICD-10-CM

## 2013-09-23 DIAGNOSIS — E119 Type 2 diabetes mellitus without complications: Secondary | ICD-10-CM | POA: Diagnosis present

## 2013-09-23 DIAGNOSIS — I129 Hypertensive chronic kidney disease with stage 1 through stage 4 chronic kidney disease, or unspecified chronic kidney disease: Secondary | ICD-10-CM | POA: Diagnosis present

## 2013-09-23 DIAGNOSIS — R233 Spontaneous ecchymoses: Secondary | ICD-10-CM | POA: Diagnosis present

## 2013-09-23 DIAGNOSIS — Z9861 Coronary angioplasty status: Secondary | ICD-10-CM

## 2013-09-23 DIAGNOSIS — R29898 Other symptoms and signs involving the musculoskeletal system: Secondary | ICD-10-CM | POA: Diagnosis present

## 2013-09-23 DIAGNOSIS — Z79899 Other long term (current) drug therapy: Secondary | ICD-10-CM

## 2013-09-23 DIAGNOSIS — K625 Hemorrhage of anus and rectum: Secondary | ICD-10-CM | POA: Diagnosis not present

## 2013-09-23 DIAGNOSIS — R29810 Facial weakness: Secondary | ICD-10-CM | POA: Diagnosis present

## 2013-09-23 DIAGNOSIS — I2510 Atherosclerotic heart disease of native coronary artery without angina pectoris: Secondary | ICD-10-CM | POA: Diagnosis present

## 2013-09-23 DIAGNOSIS — I63412 Cerebral infarction due to embolism of left middle cerebral artery: Secondary | ICD-10-CM

## 2013-09-23 DIAGNOSIS — E663 Overweight: Secondary | ICD-10-CM | POA: Diagnosis present

## 2013-09-23 LAB — URINALYSIS, ROUTINE W REFLEX MICROSCOPIC
Glucose, UA: NEGATIVE mg/dL
Ketones, ur: NEGATIVE mg/dL
Leukocytes, UA: NEGATIVE
Nitrite: NEGATIVE
Protein, ur: 100 mg/dL — AB
Specific Gravity, Urine: 1.022 (ref 1.005–1.030)
Urobilinogen, UA: 1 mg/dL (ref 0.0–1.0)
pH: 5 (ref 5.0–8.0)

## 2013-09-23 LAB — CBC WITH DIFFERENTIAL/PLATELET
Basophils Absolute: 0 10*3/uL (ref 0.0–0.1)
Basophils Relative: 0 % (ref 0–1)
Eosinophils Absolute: 0.3 10*3/uL (ref 0.0–0.7)
Eosinophils Relative: 4 % (ref 0–5)
HCT: 53.9 % — ABNORMAL HIGH (ref 39.0–52.0)
Hemoglobin: 17.8 g/dL — ABNORMAL HIGH (ref 13.0–17.0)
Lymphocytes Relative: 26 % (ref 12–46)
Lymphs Abs: 2.2 10*3/uL (ref 0.7–4.0)
MCH: 32 pg (ref 26.0–34.0)
MCHC: 33 g/dL (ref 30.0–36.0)
MCV: 96.8 fL (ref 78.0–100.0)
Monocytes Absolute: 0.8 10*3/uL (ref 0.1–1.0)
Monocytes Relative: 10 % (ref 3–12)
Neutro Abs: 5 10*3/uL (ref 1.7–7.7)
Neutrophils Relative %: 60 % (ref 43–77)
Platelets: 244 10*3/uL (ref 150–400)
RBC: 5.57 MIL/uL (ref 4.22–5.81)
RDW: 14.4 % (ref 11.5–15.5)
WBC: 8.4 10*3/uL (ref 4.0–10.5)

## 2013-09-23 LAB — PROTIME-INR
INR: 1.08 (ref 0.00–1.49)
Prothrombin Time: 14 seconds (ref 11.6–15.2)

## 2013-09-23 LAB — HEMOGLOBIN AND HEMATOCRIT, BLOOD
HCT: 53.4 % — ABNORMAL HIGH (ref 39.0–52.0)
HCT: 53.9 % — ABNORMAL HIGH (ref 39.0–52.0)
Hemoglobin: 17.8 g/dL — ABNORMAL HIGH (ref 13.0–17.0)
Hemoglobin: 17.8 g/dL — ABNORMAL HIGH (ref 13.0–17.0)

## 2013-09-23 LAB — URINE MICROSCOPIC-ADD ON

## 2013-09-23 LAB — I-STAT TROPONIN, ED: Troponin i, poc: 0 ng/mL (ref 0.00–0.08)

## 2013-09-23 LAB — GLUCOSE, CAPILLARY: Glucose-Capillary: 258 mg/dL — ABNORMAL HIGH (ref 70–99)

## 2013-09-23 LAB — TYPE AND SCREEN
ABO/RH(D): A POS
Antibody Screen: NEGATIVE

## 2013-09-23 LAB — BASIC METABOLIC PANEL
Anion gap: 13 (ref 5–15)
BUN: 20 mg/dL (ref 6–23)
CO2: 26 mEq/L (ref 19–32)
Calcium: 9 mg/dL (ref 8.4–10.5)
Chloride: 102 mEq/L (ref 96–112)
Creatinine, Ser: 1.43 mg/dL — ABNORMAL HIGH (ref 0.50–1.35)
GFR calc Af Amer: 57 mL/min — ABNORMAL LOW (ref >90)
GFR calc non Af Amer: 49 mL/min — ABNORMAL LOW (ref >90)
Glucose, Bld: 179 mg/dL — ABNORMAL HIGH (ref 70–99)
Potassium: 4.4 mEq/L (ref 3.7–5.3)
Sodium: 141 mEq/L (ref 137–147)

## 2013-09-23 LAB — I-STAT CHEM 8, ED
BUN: 26 mg/dL — ABNORMAL HIGH (ref 6–23)
Calcium, Ion: 1.04 mmol/L — ABNORMAL LOW (ref 1.13–1.30)
Chloride: 103 mEq/L (ref 96–112)
Creatinine, Ser: 1.5 mg/dL — ABNORMAL HIGH (ref 0.50–1.35)
Glucose, Bld: 189 mg/dL — ABNORMAL HIGH (ref 70–99)
HCT: 59 % — ABNORMAL HIGH (ref 39.0–52.0)
Hemoglobin: 20.1 g/dL — ABNORMAL HIGH (ref 13.0–17.0)
Potassium: 4.1 mEq/L (ref 3.7–5.3)
Sodium: 141 mEq/L (ref 137–147)
TCO2: 26 mmol/L (ref 0–100)

## 2013-09-23 LAB — CBG MONITORING, ED: Glucose-Capillary: 198 mg/dL — ABNORMAL HIGH (ref 70–99)

## 2013-09-23 LAB — MRSA PCR SCREENING: MRSA by PCR: NEGATIVE

## 2013-09-23 LAB — POC OCCULT BLOOD, ED: Fecal Occult Bld: POSITIVE — AB

## 2013-09-23 LAB — TROPONIN I: Troponin I: <0.3 ng/mL (ref <0.30)

## 2013-09-23 LAB — I-STAT CG4 LACTIC ACID, ED: Lactic Acid, Venous: 1.03 mmol/L (ref 0.5–2.2)

## 2013-09-23 LAB — APTT: aPTT: 30 seconds (ref 24–37)

## 2013-09-23 MED ORDER — ALTEPLASE (STROKE) FULL DOSE INFUSION
90.0000 mg | Freq: Once | INTRAVENOUS | Status: AC
  Administered 2013-09-23: 90 mg via INTRAVENOUS
  Filled 2013-09-23: qty 90

## 2013-09-23 MED ORDER — CETYLPYRIDINIUM CHLORIDE 0.05 % MT LIQD
7.0000 mL | Freq: Two times a day (BID) | OROMUCOSAL | Status: DC
  Administered 2013-09-23 – 2013-09-28 (×6): 7 mL via OROMUCOSAL

## 2013-09-23 MED ORDER — SENNOSIDES-DOCUSATE SODIUM 8.6-50 MG PO TABS
1.0000 | ORAL_TABLET | Freq: Every evening | ORAL | Status: DC | PRN
  Filled 2013-09-23: qty 1

## 2013-09-23 MED ORDER — ACETAMINOPHEN 325 MG PO TABS
650.0000 mg | ORAL_TABLET | ORAL | Status: DC | PRN
  Administered 2013-09-24: 650 mg via ORAL
  Filled 2013-09-23: qty 2

## 2013-09-23 MED ORDER — IOHEXOL 350 MG/ML SOLN
80.0000 mL | Freq: Once | INTRAVENOUS | Status: AC | PRN
  Administered 2013-09-23: 80 mL via INTRAVENOUS

## 2013-09-23 MED ORDER — PANTOPRAZOLE SODIUM 40 MG IV SOLR
40.0000 mg | Freq: Every day | INTRAVENOUS | Status: DC
  Administered 2013-09-23: 40 mg via INTRAVENOUS
  Filled 2013-09-23 (×2): qty 40

## 2013-09-23 MED ORDER — LABETALOL HCL 5 MG/ML IV SOLN
10.0000 mg | INTRAVENOUS | Status: DC | PRN
  Administered 2013-09-26: 10 mg via INTRAVENOUS
  Filled 2013-09-23: qty 4

## 2013-09-23 MED ORDER — STROKE: EARLY STAGES OF RECOVERY BOOK
Freq: Once | Status: AC
  Administered 2013-09-24: 22:00:00
  Filled 2013-09-23: qty 1

## 2013-09-23 MED ORDER — INSULIN ASPART 100 UNIT/ML ~~LOC~~ SOLN
0.0000 [IU] | SUBCUTANEOUS | Status: DC
  Administered 2013-09-24: 5 [IU] via SUBCUTANEOUS
  Administered 2013-09-24: 8 [IU] via SUBCUTANEOUS
  Administered 2013-09-24 (×2): 5 [IU] via SUBCUTANEOUS
  Administered 2013-09-24: 3 [IU] via SUBCUTANEOUS

## 2013-09-23 MED ORDER — ACETAMINOPHEN 650 MG RE SUPP: 650.0000 mg | RECTAL | Status: DC | PRN

## 2013-09-23 MED ORDER — SODIUM CHLORIDE 0.9 % IV SOLN
INTRAVENOUS | Status: DC
  Administered 2013-09-24 (×2): via INTRAVENOUS

## 2013-09-23 NOTE — ED Notes (Signed)
Pt getting more agitated attempting to sit up and to turn on his side.  He is having birght red blood in his urine for the past 15 minutes

## 2013-09-23 NOTE — ED Notes (Signed)
Report called to rn on 3100

## 2013-09-23 NOTE — ED Notes (Signed)
The pt arrived the the ed and a code stroke was called.  Se rapid response notes for detail times.  The pt had a biopsy of his prostate in dr Yetta Numbers office today.  He came home doing ok and he came downstairs to his wife and she noticed a jerking in his face and then he became confused and slumped to the rt side.  Not moving his  Rt arm and rt leg speech none lt sided gaze.  To c-t

## 2013-09-23 NOTE — ED Notes (Signed)
Iv fluid going in the iv in his lt forearm

## 2013-09-23 NOTE — ED Notes (Signed)
Lab at the bedside for every 2 hour hand h

## 2013-09-23 NOTE — Code Documentation (Signed)
Code stroke called at 1554. Patient arrived to [facility] ED via [facility] at [phone].  As per EMS and family, patient had a prostate biopsy doen today and was going to eat something when she noticed he had a sudden change of facial droop, right side weakness and unable to speak or follow commands.  LSN 5400, NIHSS 19 on arrival.  Pharmacy notified to mix TPA at 1624, and TPA delivered to bedside at 1630.  2nd PIV inserted and foley inserted prior to TPa administration. TPA started at [place].   AT 1700, patient brought bact to CT scan for a CTA.  3MW bed requested.

## 2013-09-23 NOTE — ED Notes (Signed)
The pt is not responding to instructions.  He did not pass the stroke swallow screen

## 2013-09-23 NOTE — H&P (Addendum)
H&P    Chief Complaint: Code stroke  HPI:                                                                                                                                         James Mcclain is an 68 y.o. male who had just returned from lunch after a Prostate biopsy when his wife noted he had sudden onset of right arm and leg weakness, became mute and not following commands. EMS was called and found patient as noted prior and had a BP 218/110.  Patient was brought to [facility] hospital where he was noted to have a left gaze preference, not crossing midline, mute and not following commands, right arm drift and right arm and leg weakness. Initial CT head was negative.   Date last known well: Date: 09/23/2013 Time last known well: Time: 15:17 tPA Given: Yes  Past Medical History  Diagnosis Date  . Arthritis   . Cholesterol serum elevated   . Hypertension   . Elevated PSA   . Coronary artery disease 08/05/2012    s/p Xience DES to the LAD  . Heart murmur   . Shortness of breath   . Prostate cancer 07/15/12  . Diabetes mellitus     x 8 years    Past Surgical History  Procedure Laterality Date  . Kidney stones      2006  . Tonsillectomy      as adult  . Total knee arthroplasty  01/24/2011    Procedure: TOTAL KNEE ARTHROPLASTY;  Surgeon: Alta Corning;  Location: [facility];  Service: Orthopedics;  Laterality: Left;  COMPUTER ASSISTED LEFT  TOTAL KNEE REPLACEMENT. Anesthesia a combination of regional and general.  . Coronary angioplasty with stent placement  08/05/2012    LAD, 1 stent  . Prostate biopsy  07/15/2012    Family History  Problem Relation Age of Onset  . Leukemia Mother   . Heart Problems Father   . Heart Problems      family history  . Diabetes Sister   . Heart Problems Brother    Social History:  reports that he has never smoked. He has never used smokeless tobacco. He reports that he does not drink alcohol or use illicit drugs.  Allergies:  Allergies  Allergen  Reactions  . Fish Allergy Swelling    Medications:  Current Facility-Administered Medications  Medication Dose Route Frequency Provider Last Rate Last Dose  . alteplase (ACTIVASE) 1 mg/mL infusion 90 mg  90 mg Intravenous Once Alexis Goodell, MD       Current Outpatient Prescriptions  Medication Sig Dispense Refill  . allopurinol (ZYLOPRIM) 100 MG tablet TAKE TWO TABLETS BY MOUTH ONCE DAILY  30 tablet  11  . amLODipine (NORVASC) 10 MG tablet TAKE ONE TABLET BY MOUTH ONCE DAILY  30 tablet  11  . aspirin EC 81 MG tablet Take 1 tablet (81 mg total) by mouth daily.      [name] atorvastatin (LIPITOR) 40 MG tablet TAKE ONE TABLET BY MOUTH ONCE DAILY  30 tablet  3  . Canagliflozin (INVOKANA) 300 MG TABS Take 1 tablet (300 mg total) by mouth daily.  90 tablet  3  . carvedilol (COREG) 12.5 MG tablet Take 1 tablet (12.5 mg total) by mouth 2 (two) times daily.  60 tablet  6  . carvedilol (COREG) 6.25 MG tablet Take 1 tablet (6.25 mg total) by mouth 2 (two) times daily with a meal.  60 tablet  6  . Cholecalciferol (VITAMIN D3) 5000 UNITS CAPS Take 10,000 Units by mouth daily.      . clopidogrel (PLAVIX) 75 MG tablet Take 1 tablet (75 mg total) by mouth daily with breakfast.  30 tablet  2  . insulin glargine (LANTUS) 100 units/mL SOLN Inject 35 Units into the skin at bedtime.       . Insulin Pen Needle 32G X 4 MM MISC As directed  100 each  5  . losartan-hydrochlorothiazide (HYZAAR) 100-25 MG per tablet TAKE ONE TABLET BY MOUTH ONCE DAILY  30 tablet  5  . metFORMIN (GLUCOPHAGE) 1000 MG tablet Take 1 tablet (1,000 mg total) by mouth 2 (two) times daily with a meal.  180 tablet  3  . zinc gluconate 50 MG tablet Take 100 mg by mouth daily.          ROS:                                                                                                                                        History obtained from unobtainable from patient due to language barrier  Neurologic Examination:                                                                                                      Blood pressure 146/72, pulse 61, temperature 97.5 F (36.4  C), temperature source Oral, resp. rate 19, height 5' 10.08" (1.78 m), weight 125 kg (275 lb 9.2 oz), SpO2 100.00%.  Physical Exam  Constitutional: He appears well-developed and well-nourished.  HENT:  Head: Normocephalic.  Cardiovascular: Normal rate and regular rhythm.   Respiratory: Effort normal and breath sounds normal.  GI: Soft. Bowel sounds are normal. He exhibits no distension. There is no tenderness.   Mental Status: Alert, globally aphasic, follows only visual commands by mimicking  Cranial Nerves: II: Discs flat bilaterally; Visual fields shows a right hemianopsia, pupils equal, round, reactive to light and accommodation III,IV, VI: ptosis not present, eyes do not cross the midline to the right and have a forced gaze deviation to the left V,VII: smile asymmetric asymmetric on the right, no wince to pain on the right VIII: hearing normal bilaterally IX,X: gag reflex present XI: bilateral shoulder shrug XII: midline tongue extension without atrophy or fasciculations  Motor: Right : Upper extremity   3/5    Left:     Upper extremity   5/5  Lower extremity   3/5     Lower extremity   5/5 --drift on the right arm Tone and bulk:normal tone throughout; no atrophy noted Sensory: no response to noxious stimuli on the right arm and leg Deep Tendon Reflexes:  Right: Upper Extremity   Left: Upper extremity   biceps (C-5 to C-6) 2/4   biceps (C-5 to C-6) 2/4 tricep (C7) 2/4    triceps (C7) 2/4 Brachioradialis (C6) 2/4  Brachioradialis (C6) 2/4  Lower Extremity Lower Extremity  quadriceps (L-2 to L-4) 1/4   quadriceps (L-2 to L-4) 1/4 Achilles (S1) 0/4   Achilles (S1) 0/4  Plantars: Right: up going   Left:  downgoing Cerebellar: Unable to assess Gait: unable to assess CV: pulses palpable throughout    Lab Results: Basic Metabolic Panel:  Recent Labs Lab 09/23/13 1626  NA 141  K 4.1  CL 103  GLUCOSE 189*  BUN 26*  CREATININE 1.50*    Liver Function Tests: No results found for this basename: AST, ALT, ALKPHOS, BILITOT, PROT, ALBUMIN,  in the last 168 hours No results found for this basename: LIPASE, AMYLASE,  in the last 168 hours No results found for this basename: AMMONIA,  in the last 168 hours  CBC:  Recent Labs Lab 09/23/13 1626  HGB 20.1*  HCT 59.0*    Cardiac Enzymes: No results found for this basename: CKTOTAL, CKMB, CKMBINDEX, TROPONINI,  in the last 168 hours  Lipid Panel: No results found for this basename: CHOL, TRIG, HDL, CHOLHDL, VLDL, LDLCALC,  in the last 168 hours  CBG:  Recent Labs Lab 09/23/13 [place] 198*    Microbiology: Results for orders placed during the hospital encounter of 01/23/11  SURGICAL PCR SCREEN     Status: None   Collection Time    01/23/11  2:58 PM      Result Value Ref Range Status   MRSA, PCR NEGATIVE  NEGATIVE Final   Staphylococcus aureus NEGATIVE  NEGATIVE Final   Comment:            The Xpert SA Assay (FDA     approved for NASAL specimens     only), is one component of     a comprehensive surveillance     program.  It is not intended     to diagnose infection nor to     guide or monitor treatment.    Coagulation Studies: No results found for  this basename: LABPROT, INR,  in the last 72 hours  Imaging: No results found.     Assessment and plan discussed with with attending physician and they are in agreement.    Etta Quill PA-C [facility] [phone]  09/23/2013, 4:29 PM   Assessment: 68 y.o. male presenting to ED with sudden onset of mutism, right facial droop, right arm and leg paresis and left gaze preference.  Left brain infarct suspected.  Contraindications to tPA discussed  and it seems that the patient had a prostate biopsy today. Dr. Janice Norrie was called and felt that the patient should still be considered a tPA candidate but patient should be followed closely for bleeding.  Additional time was required to ensure clearance by urology.  Risks and benefits discussed with wife and verbal consent obtained.  tPA was administered.   Stroke Risk Factors - diabetes mellitus, hyperlipidemia, hypertension and CAD  1. HgbA1c, fasting lipid panel 2. MRI of the brain without contrast 3. PT consult, OT consult, Speech consult 4. Echocardiogram 5. CTA of the head and neck 6. Prophylactic therapy-None 7. Foley to be placed prior to tPA adminsitration 8. Telemetry monitoring 9. Frequent neuro checks 10. H&H q 2hours 11. Admission to NICU 12. Consent for transfusion obtained  This patient is critically ill and at significant risk of neurological worsening, death and care requires constant monitoring of vital signs, hemodynamics,respiratory and cardiac monitoring, neurological assessment, discussion with family, other specialists and medical decision making of high complexity. I spent 120 minutes of neurocritical care time  in the care of  this patient.  Alexis Goodell, MD [facility] [phone] 09/23/2013  5:32 PM

## 2013-09-23 NOTE — ED Notes (Signed)
Unsuccessful report to be called.  rn will call me back

## 2013-09-23 NOTE — ED Notes (Signed)
tpa imfused

## 2013-09-23 NOTE — ED Notes (Signed)
The pt is aggravated now and he is attempting to get up

## 2013-09-24 ENCOUNTER — Inpatient Hospital Stay (HOSPITAL_COMMUNITY): Payer: Medicare Other

## 2013-09-24 DIAGNOSIS — I517 Cardiomegaly: Secondary | ICD-10-CM

## 2013-09-24 LAB — LIPID PANEL
Cholesterol: 124 mg/dL (ref 0–200)
HDL: 33 mg/dL — ABNORMAL LOW (ref >39)
LDL Cholesterol: 70 mg/dL (ref 0–99)
Total CHOL/HDL Ratio: 3.8 RATIO
Triglycerides: 104 mg/dL (ref <150)
VLDL: 21 mg/dL (ref 0–40)

## 2013-09-24 LAB — GLUCOSE, CAPILLARY
Glucose-Capillary: 220 mg/dL — ABNORMAL HIGH (ref 70–99)
Glucose-Capillary: 186 mg/dL — ABNORMAL HIGH (ref 70–99)
Glucose-Capillary: 226 mg/dL — ABNORMAL HIGH (ref 70–99)
Glucose-Capillary: 226 mg/dL — ABNORMAL HIGH (ref 70–99)
Glucose-Capillary: 311 mg/dL — ABNORMAL HIGH (ref 70–99)

## 2013-09-24 LAB — HEMOGLOBIN A1C
Hgb A1c MFr Bld: 8.5 % — ABNORMAL HIGH (ref <5.7)
Mean Plasma Glucose: 197 mg/dL — ABNORMAL HIGH (ref <117)

## 2013-09-24 MED ORDER — PANTOPRAZOLE SODIUM 40 MG PO TBEC
40.0000 mg | DELAYED_RELEASE_TABLET | Freq: Every day | ORAL | Status: DC
  Administered 2013-09-24 – 2013-09-28 (×5): 40 mg via ORAL
  Filled 2013-09-24 (×5): qty 1

## 2013-09-24 MED ORDER — ENOXAPARIN SODIUM 30 MG/0.3ML ~~LOC~~ SOLN
30.0000 mg | SUBCUTANEOUS | Status: DC
  Filled 2013-09-24: qty 0.3

## 2013-09-24 MED ORDER — CLOPIDOGREL BISULFATE 75 MG PO TABS
75.0000 mg | ORAL_TABLET | Freq: Every day | ORAL | Status: DC
  Filled 2013-09-24: qty 1

## 2013-09-24 MED ORDER — INSULIN ASPART 100 UNIT/ML ~~LOC~~ SOLN
0.0000 [IU] | Freq: Three times a day (TID) | SUBCUTANEOUS | Status: DC
  Administered 2013-09-25: 11 [IU] via SUBCUTANEOUS
  Administered 2013-09-25 – 2013-09-26 (×3): 5 [IU] via SUBCUTANEOUS
  Administered 2013-09-26 (×2): 8 [IU] via SUBCUTANEOUS
  Administered 2013-09-27: 3 [IU] via SUBCUTANEOUS
  Administered 2013-09-27: 11 [IU] via SUBCUTANEOUS
  Administered 2013-09-28: 2 [IU] via SUBCUTANEOUS
  Administered 2013-09-28: 3 [IU] via SUBCUTANEOUS

## 2013-09-24 MED ORDER — ASPIRIN 81 MG PO CHEW: 81.0000 mg | CHEWABLE_TABLET | Freq: Every day | ORAL | Status: DC

## 2013-09-24 MED ORDER — ASPIRIN 81 MG PO CHEW
324.0000 mg | CHEWABLE_TABLET | Freq: Every day | ORAL | Status: DC
  Administered 2013-09-24 – 2013-09-25 (×2): 324 mg via ORAL
  Filled 2013-09-24 (×2): qty 4

## 2013-09-24 MED ORDER — ASPIRIN 81 MG PO CHEW: 324.0000 mg | CHEWABLE_TABLET | Freq: Every day | ORAL | Status: DC

## 2013-09-24 MED ORDER — INSULIN ASPART 100 UNIT/ML ~~LOC~~ SOLN
0.0000 [IU] | Freq: Every day | SUBCUTANEOUS | Status: DC
  Administered 2013-09-24: 4 [IU] via SUBCUTANEOUS
  Administered 2013-09-25: 3 [IU] via SUBCUTANEOUS

## 2013-09-24 NOTE — Progress Notes (Signed)
Pt CBG 258. Dr. Armida Sans notified, orders received to change CBG to Q4H and Add SSI moderate scale. Will monitor.

## 2013-09-24 NOTE — Progress Notes (Signed)
Stroke Team Progress Note  HISTORY  68 y.o. male who had just returned from lunch after a Prostate biopsy when his wife noted he had sudden onset of right arm and leg weakness, became mute and not following commands. EMS was called and found patient as noted prior and had a BP 218/110. Patient was brought to [facility] hospital where he was noted to have a left gaze preference, not crossing midline, mute and not following commands, right arm drift and right arm and leg weakness. Initial CT head was negative.  S/p TPA at 16:45. NIHSS 19 on admission.     SUBJECTIVE Pt is feeling better, visual fields intact, no pronator drift. Aphasia present.   OBJECTIVE Most recent Vital Signs: Filed Vitals:   09/24/13 0600 09/24/13 0700 09/24/13 0800 09/24/13 0900  BP: 152/73 128/61 125/55 146/76  Pulse: 70 69 68 69  Temp: 98.4 F (36.9 C) 98.1 F (36.7 C) 98.1 F (36.7 C) 98.2 F (36.8 C)  TempSrc:   Core (Comment)   Resp: 16 11 19 14   Height:      Weight:      SpO2: 100% 100% 100% 99%   CBG (last 3)   Recent Labs  09/23/13 2334 09/24/13 0309 09/24/13 0747  GLUCAP 258* 220* 186*    IV Fluid Intake:   . sodium chloride 75 mL/hr at 09/24/13 0900    MEDICATIONS  .  stroke: mapping our early stages of recovery book   Does not apply Once  . antiseptic oral rinse  7 mL Mouth Rinse BID  . insulin aspart  0-15 Units Subcutaneous 6 times per day  . pantoprazole (PROTONIX) IV  40 mg Intravenous QHS   PRN:  acetaminophen, acetaminophen, labetalol, senna-docusate  Diet:  General  Activity:  Bathroom privileges DVT Prophylaxis:  SCDs  CLINICALLY SIGNIFICANT STUDIES Basic Metabolic Panel:  Recent Labs Lab 09/23/13 1626 09/23/13 1640  NA 141 141  K 4.1 4.4  CL 103 102  CO2  --  26  GLUCOSE 189* 179*  BUN 26* 20  CREATININE 1.50* 1.43*  CALCIUM  --  9.0   Liver Function Tests: No results found for this basename: AST, ALT, ALKPHOS, BILITOT, PROT, ALBUMIN,  in the last 168 hours CBC:   Recent Labs Lab 09/23/13 1640 09/23/13 1700 09/23/13 1950  WBC 8.4  --   --   NEUTROABS 5.0  --   --   HGB 17.8* 17.8* 17.8*  HCT 53.9* 53.4* 53.9*  MCV 96.8  --   --   PLT 244  --   --    Coagulation:  Recent Labs Lab 09/23/13 1640  LABPROT 14.0  INR 1.08   Cardiac Enzymes:  Recent Labs Lab 09/23/13 1640  TROPONINI <0.30   Urinalysis:  Recent Labs Lab 09/23/13 1723  COLORURINE AMBER*  LABSPEC 1.022  PHURINE 5.0  GLUCOSEU NEGATIVE  HGBUR LARGE*  BILIRUBINUR SMALL*  KETONESUR NEGATIVE  PROTEINUR 100*  UROBILINOGEN 1.0  NITRITE NEGATIVE  LEUKOCYTESUR NEGATIVE   Lipid Panel    Component Value Date/Time   CHOL 124 09/24/2013 0224   TRIG 104 09/24/2013 0224   HDL 33* 09/24/2013 0224   CHOLHDL 3.8 09/24/2013 0224   VLDL 21 09/24/2013 0224   LDLCALC 70 09/24/2013 0224   HgbA1C  Lab Results  Component Value Date   HGBA1C 7.9* 08/16/2013    Urine Drug Screen:   No results found for this basename: labopia, cocainscrnur, labbenz, amphetmu, thcu, labbarb    Alcohol Level: No results  found for this basename: ETH,  in the last 168 hours  Ct Angio Head W/cm &/or Wo Cm  09/23/2013   CLINICAL DATA:  Stroke. Sudden onset of right arm and leg weakness after prostate biopsy N launch today. The patient became and phasic. He was hypertensive. Left gaze preference.  EXAM: CT ANGIOGRAPHY HEAD AND NECK  TECHNIQUE: Multidetector CT imaging of the head and neck was performed using the standard protocol during bolus administration of intravenous contrast. Multiplanar CT image reconstructions and MIPs were obtained to evaluate the vascular anatomy. Carotid stenosis measurements (when applicable) are obtained utilizing NASCET criteria, using the distal internal carotid diameter as the denominator.  CONTRAST:  62mL OMNIPAQUE IOHEXOL 350 MG/ML SOLN  COMPARISON:  CT head without contrast 09/23/2013.  FINDINGS: CTA HEAD FINDINGS  Source images demonstrate moderate white matter disease without focal  cortical infarct. No hemorrhage or mass lesion is present.  Atherosclerotic calcifications are present within the cavernous internal carotid arteries bilaterally without significant stenosis. The A1 and M1 segments are normal. Left A1 segment is dominant. The anterior communicating artery is patent. The MCA bifurcations are within normal limits. ACA and MCA branch vessels are unremarkable.  The vertebral arteries are codominant. The vertebrobasilar junction is within normal limits. The right PICA and PICA origins are normal. The left AICA is dominant. The basilar artery is within normal limits. Both posterior cerebral arteries originate from the basilar tip. The PCA branch vessels are within normal limits.  Review of the MIP images confirms the above findings.  CTA NECK FINDINGS  There is a common origin of the left common carotid artery in the innominate artery. The vertebral arteries both originate from the subclavian arteries. The vertebral arteries are codominant. There is no significant focal stenosis of the vertebral arteries in the neck.  The right common carotid artery is within normal limits. The bifurcation is unremarkable. There is mild tortuosity of the cervical right internal carotid artery without significant stenosis.  The proximal left common carotid artery is mildly tortuous without significant stenosis. Atherosclerotic calcifications are present at carotid bifurcation. There is no significant associated stenosis. Moderate tortuosity is present in the mid cervical ICA without significant stenosis.  The soft tissues of the neck are otherwise unremarkable. The lung apices are clear. Bone windows demonstrate multilevel degenerative endplate changes, most evident from C4-5 through C6-7.  Review of the MIP images confirms the above findings.  IMPRESSION: 1. Mild to moderate generalized white matter disease is again seen. 2. No focal cortical infarct is evident. 3. Mild atherosclerotic calcifications  within the cavernous carotid arteries without significant stenosis. 4. Atherosclerotic calcifications in the left carotid bifurcation without significant stenosis. 5. Tortuosity of cervical internal carotid arteries is worse on the left. There is no significant stenosis. 6. Multilevel degenerative endplate changes of cervical spine as described.   Electronically Signed   By: Lawrence Santiago M.D.   On: 09/23/2013 17:36   Ct Head Wo Contrast  09/23/2013   CLINICAL DATA:  Code stroke.  Right-sided weakness.  Slurred speech.  EXAM: CT HEAD WITHOUT CONTRAST  TECHNIQUE: Contiguous axial images were obtained from the base of the skull through the vertex without intravenous contrast.  COMPARISON:  03/13/2009  FINDINGS: Ventricles, cisterns and other CSF spaces are within normal. There is no mass, mass effect, shift of midline structures or acute hemorrhage. There is subtle chronic ischemic microvascular disease present. There is no definite evidence of acute infarction. Mild opacification over the left mastoid air cells unchanged. Remaining  bones and soft tissues are within normal.  IMPRESSION: No acute intracranial findings.  Minimal chronic ischemic microvascular disease.  Critical Value/emergent results were called by telephone at the time of interpretation on 09/23/2013 at 4:27 pm to Dr. Eulis Foster, who verbally acknowledged these results.   Electronically Signed   By: Marin Olp M.D.   On: 09/23/2013 16:27   Ct Angio Neck W/cm &/or Wo/cm  09/23/2013   CLINICAL DATA:  Stroke. Sudden onset of right arm and leg weakness after prostate biopsy N launch today. The patient became and phasic. He was hypertensive. Left gaze preference.  EXAM: CT ANGIOGRAPHY HEAD AND NECK  TECHNIQUE: Multidetector CT imaging of the head and neck was performed using the standard protocol during bolus administration of intravenous contrast. Multiplanar CT image reconstructions and MIPs were obtained to evaluate the vascular anatomy. Carotid  stenosis measurements (when applicable) are obtained utilizing NASCET criteria, using the distal internal carotid diameter as the denominator.  CONTRAST:  71mL OMNIPAQUE IOHEXOL 350 MG/ML SOLN  COMPARISON:  CT head without contrast 09/23/2013.  FINDINGS: CTA HEAD FINDINGS  Source images demonstrate moderate white matter disease without focal cortical infarct. No hemorrhage or mass lesion is present.  Atherosclerotic calcifications are present within the cavernous internal carotid arteries bilaterally without significant stenosis. The A1 and M1 segments are normal. Left A1 segment is dominant. The anterior communicating artery is patent. The MCA bifurcations are within normal limits. ACA and MCA branch vessels are unremarkable.  The vertebral arteries are codominant. The vertebrobasilar junction is within normal limits. The right PICA and PICA origins are normal. The left AICA is dominant. The basilar artery is within normal limits. Both posterior cerebral arteries originate from the basilar tip. The PCA branch vessels are within normal limits.  Review of the MIP images confirms the above findings.  CTA NECK FINDINGS  There is a common origin of the left common carotid artery in the innominate artery. The vertebral arteries both originate from the subclavian arteries. The vertebral arteries are codominant. There is no significant focal stenosis of the vertebral arteries in the neck.  The right common carotid artery is within normal limits. The bifurcation is unremarkable. There is mild tortuosity of the cervical right internal carotid artery without significant stenosis.  The proximal left common carotid artery is mildly tortuous without significant stenosis. Atherosclerotic calcifications are present at carotid bifurcation. There is no significant associated stenosis. Moderate tortuosity is present in the mid cervical ICA without significant stenosis.  The soft tissues of the neck are otherwise unremarkable. The  lung apices are clear. Bone windows demonstrate multilevel degenerative endplate changes, most evident from C4-5 through C6-7.  Review of the MIP images confirms the above findings.  IMPRESSION: 1. Mild to moderate generalized white matter disease is again seen. 2. No focal cortical infarct is evident. 3. Mild atherosclerotic calcifications within the cavernous carotid arteries without significant stenosis. 4. Atherosclerotic calcifications in the left carotid bifurcation without significant stenosis. 5. Tortuosity of cervical internal carotid arteries is worse on the left. There is no significant stenosis. 6. Multilevel degenerative endplate changes of cervical spine as described.   Electronically Signed   By: Lawrence Santiago M.D.   On: 09/23/2013 17:36    CT of the brain    MRI of the brain    MRA of the brain    Carotid Doppler  Pending   2D Echocardiogram  pending  CXR    EKG  normal EKG, normal sinus rhythm, unchanged from previous tracings.  For complete results please see formal report.   Therapy Recommendations  pending Physical Exam   Physical Exam  Constitutional: He appears well-developed and well-nourished.  HENT:  Head: Normocephalic.  Cardiovascular: Normal rate and regular rhythm.  Respiratory: Effort normal and breath sounds normal.  GI: Soft. Bowel sounds are normal. He exhibits no distension. There is no tenderness.   Mental Status:  Alert,aphasia improved. Expressive aphasia still present.  Cranial Nerves:  II: Discs flat bilaterally; Visual fields intact III,IV, VI: ptosis not present, eyes do not cross the midline to the right and have a forced gaze deviation to the left  V,VII: smile asymmetric asymmetric on the right, no wince to pain on the right  VIII: hearing normal bilaterally  IX,X: gag reflex present  XI: bilateral shoulder shrug  XII: midline tongue extension without atrophy or fasciculations Motor:  Right : Upper extremity 5/5 Left: Upper extremity  5/5  Lower extremity 5/5 Lower extremity 5/5  Tone and bulk:normal tone throughout; no atrophy noted  Sensory: no response to noxious stimuli on the right arm and leg  Deep Tendon Reflexes:  Right: Upper Extremity Left: Upper extremity  biceps (C-5 to C-6) 2/4 biceps (C-5 to C-6) 2/4  tricep (C7) 2/4 triceps (C7) 2/4  Brachioradialis (C6) 2/4 Brachioradialis (C6) 2/4  Lower Extremity Lower Extremity  quadriceps (L-2 to L-4) 1/4 quadriceps (L-2 to L-4) 1/4  Achilles (S1) 0/4 Achilles (S1) 0/4  Plantars:  Right: up going Left: downgoing  Cerebellar:  Unable to assess  Gait: unable to assess  CV: pulses palpable throughout    ASSESSMENT [name] is a 68 y.o. male presenting to ED with sudden onset of mutism, right facial droop, right arm and leg paresis and left gaze preference. Left brain infarct suspected. S/p TPA with significant improvement. No fields cuts. NIHSS 5.        Elevated H/H  LDL 70   Hospital day # 1  TREATMENT/PLAN  Continue ASA 81 and plavix 75 later today after imaging confirms no signs of hemorrhagic conversion  Start lovenox 24 hrs post TPA  PT/Ot  Likely transfer to floor in AM  Elevated H/H likely hemoconcentr3ed and possibly from dehydration.  SBP <180  MRI 24 hrs post TPA     SIGNED Leotis Pain    To contact Stroke Continuity provider, please refer to [email or web address] After hours, contact General Neurology

## 2013-09-24 NOTE — Progress Notes (Signed)
Echocardiogram 2D Echocardiogram has been performed.  Joelene Millin 09/24/2013, 2:32 PM

## 2013-09-24 NOTE — Progress Notes (Signed)
Spoke to Dr. Aram Beecham, will give first dose ASA tonight.

## 2013-09-24 NOTE — Progress Notes (Signed)
Report given to RN on 4N will transfer pt to room 22.

## 2013-09-24 NOTE — Progress Notes (Signed)
PT Cancellation Note  Patient Details Name: James Mcclain MRN: [id number] DOB: 06/27/1945   Cancelled Treatment:    Reason Eval/Treat Not Completed: Patient not medically ready. Pt remains on strict bed rest due to receiving tpA. PT to re-attempt 8/2 if/when appropriate.   Kingsley Callander 09/24/2013, 1:09 PM  Kittie Plater, PT, DPT Pager #: [phone] Office #: [phone]

## 2013-09-24 NOTE — Evaluation (Addendum)
Clinical/Bedside Swallow Evaluation Patient Details  Name: James Mcclain MRN: [id number] Date of Birth: 1945/07/14  Today's Date: 09/24/2013 Time: 0900-0916 SLP Time Calculation (min): 16 min  Past Medical History:  Past Medical History  Diagnosis Date  . Arthritis   . Cholesterol serum elevated   . Hypertension   . Elevated PSA   . Coronary artery disease 08/05/2012    s/p Xience DES to the LAD  . Heart murmur   . Shortness of breath   . Prostate cancer 07/15/12  . Diabetes mellitus     x 8 years   Past Surgical History:  Past Surgical History  Procedure Laterality Date  . Kidney stones      2006  . Tonsillectomy      as adult  . Total knee arthroplasty  01/24/2011    Procedure: TOTAL KNEE ARTHROPLASTY;  Surgeon: Alta Corning;  Location: [place];  Service: Orthopedics;  Laterality: Left;  COMPUTER ASSISTED LEFT  TOTAL KNEE REPLACEMENT. Anesthesia a combination of regional and general.  . Coronary angioplasty with stent placement  08/05/2012    LAD, 1 stent  . Prostate biopsy  07/15/2012   HPI:  68 y.o. male admitted 7/31 for R arm/ leg weakness/ mute/ not following commands. Head CT showed no acute findings, awaiting MRI. Bedside swallow eval ordered as pt is reportedly not responding to speech therefore not passing stroke swallow screen.   Assessment / Plan / Recommendation Clinical Impression  Pt demonstrated no overt s/s of aspiration at bedside. Swallow appeared timely with adequate hyolaryngeal elevation. Pt much more alert today compared to yesterday's reports. Followed functional 1-2 step directions during eval but presenting with expressive aphasia. Risk of aspiration is low based on this evaluation. Recommend initiating regular diet/ thin liquids, meds whole with liquid, intermittent supervision to cue pt to take small bites/ sips. No f/u recommended for swallowing. ST will continue to follow for speech/ language evaluation.     Aspiration Risk  Mild    Diet  Recommendation Regular;Thin liquid   Liquid Administration via: Cup;Straw Medication Administration: Whole meds with liquid Supervision: Patient able to self feed;Intermittent supervision to cue for compensatory strategies Compensations: Slow rate;Small sips/bites Postural Changes and/or Swallow Maneuvers: Seated upright 90 degrees    Other  Recommendations Oral Care Recommendations: Oral care BID   Follow Up Recommendations  Other (comment) (pending SLP eval)    Frequency and Duration        Pertinent Vitals/Pain n/a    SLP Swallow Goals     Swallow Study Prior Functional Status       General HPI: 68 y.o. male admitted 7/31 for R arm/ leg weakness/ mute/ not following commands. Head CT showed no acute findings, awaiting MRI. Bedside swallow eval ordered as pt is reportedly not responding to speech. Type of Study: Bedside swallow evaluation Diet Prior to this Study: NPO Temperature Spikes Noted: No Respiratory Status: Nasal cannula History of Recent Intubation: No Behavior/Cognition: Alert;Cooperative;Pleasant mood Oral Cavity - Dentition: Poor condition Self-Feeding Abilities: Able to feed self Patient Positioning: Upright in bed Baseline Vocal Quality: Clear Volitional Cough: Strong Volitional Swallow: Able to elicit    Oral/Motor/Sensory Function Overall Oral Motor/Sensory Function: Appears within functional limits for tasks assessed Labial ROM: Within Functional Limits Labial Symmetry: Within Functional Limits Labial Strength: Within Functional Limits Lingual ROM: Within Functional Limits Lingual Symmetry: Within Functional Limits Lingual Strength: Within Functional Limits   Ice Chips Ice chips: Not tested   Thin Liquid Thin Liquid: Within functional  limits Presentation: Cup;Straw    Nectar Thick Nectar Thick Liquid: Not tested   Honey Thick Honey Thick Liquid: Not tested   Puree Puree: Within functional limits   Solid   GO    Solid: Within functional  limits Presentation: [place], Baldemar Dady K, MA, CCC-SLP 09/24/2013,9:23 AM

## 2013-09-24 NOTE — Evaluation (Addendum)
Speech Language Pathology Evaluation Patient Details Name: James Mcclain MRN: [id number] DOB: [birth year] Today's Date: 09/24/2013 Time: 6295-2841 SLP Time Calculation (min): 21 min  Problem List:  Patient Active Problem List   Diagnosis Date Noted  . CVA (cerebral infarction) 09/23/2013  . CAD (coronary artery disease) 12/17/2012  . Prostate cancer   . 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 Exertional dyspnea 06/23/2012  . Murmur 06/23/2012  . Type II or unspecified type diabetes mellitus without mention of complication, not stated as uncontrolled   . Elevated serum cholesterol   . Hypertension   . Elevated PSA    Past Medical History:  Past Medical History  Diagnosis Date  . Arthritis   . Cholesterol serum elevated   . Hypertension   . Elevated PSA   . Coronary artery disease 08/05/2012    s/p Xience DES to the LAD  . Heart murmur   . Shortness of breath   . Prostate cancer 07/15/12  . Diabetes mellitus     x 8 years   Past Surgical History:  Past Surgical History  Procedure Laterality Date  . Kidney stones      2006  . Tonsillectomy      as adult  . Total knee arthroplasty  01/24/2011    Procedure: TOTAL KNEE ARTHROPLASTY;  Surgeon: Alta Corning;  Location: [place];  Service: Orthopedics;  Laterality: Left;  COMPUTER ASSISTED LEFT  TOTAL KNEE REPLACEMENT. Anesthesia a combination of regional and general.  . Coronary angioplasty with stent placement  08/05/2012    LAD, 1 stent  . Prostate biopsy  07/15/2012   HPI:  68 y.o. male admitted 7/31 for R arm/ leg weakness/ mute/ not following commands. Head CT showed no acute findings, awaiting MRI. Bedside swallow eval ordered as pt is reportedly not responding to speech.   Assessment / Plan / Recommendation Clinical Impression  Pt is currently presenting with a mod-severe receptive/ expressive aphasia. Able to follow 2-step directions with about 50% accuracy. Expressively able to name items in room  with 75% accuracy. In conversation, speech is fluent with verbal errors primarily consisting of neologisms, phonemic paraphasias, and frequent perseveration. Same errors occur in written expression. Pt aware of errors most of the time and is frustrated. No dysarthria/ motor speech errors present. Pt likely having difficulties expressing basic wants/ needs at this time and therefore safety is a concern; additionally during eval pt was unable to ID call bell even with multiple rehearsals. Cognition difficult to assess with aphasia. Given these findings, continued ST is warranted to address language skills. Also will provide communication board to increase pt's ability to express wants/ needs in acute care setting.     SLP Assessment  Patient needs continued Speech Lanaguage Pathology Services    Follow Up Recommendations  Inpatient Rehab    Frequency and Duration min 2x/week  2 weeks   Pertinent Vitals/Pain n/a   SLP Goals  SLP Goals Potential to Achieve Goals: Good  SLP Evaluation Prior Functioning  Cognitive/Linguistic Baseline: Within functional limits  Lives With: Spouse Available Help at Discharge: Family Vocation: Retired   Associate Professor  Overall Cognitive Status: Difficult to assess Arousal/Alertness: Awake/alert Orientation Level: Oriented to person;Oriented to place;Other (comment) (Aphasia- diff to assess time/ situation orientation) Attention: Selective Selective Attention: Appears intact Awareness:  (sometimes aware, sometimes unaware of speech errors) Behaviors: Perseveration    Comprehension  Auditory Comprehension Overall Auditory Comprehension: Impaired Yes/No Questions: Impaired Basic Biographical Questions: 51-75% accurate Basic Immediate  Environment Questions: 50-74% accurate Commands: Impaired Two Step Basic Commands: 50-74% accurate Multistep Basic Commands: 50-74% accurate Conversation: Simple EffectiveTechniques: Visual/Gestural cues    Expression  Expression Primary Mode of Expression: Verbal (also using gestures effectively to enhance communication) Verbal Expression Overall Verbal Expression: Impaired Initiation: No impairment Automatic Speech: Name;Day of week Level of Generative/Spontaneous Verbalization: Sentence Repetition: Impaired Level of Impairment: Word level Naming: Impairment Confrontation: Impaired Convergent: 50-74% accurate Verbal Errors: Phonemic paraphasias;Neologisms;Perseveration Pragmatics: No impairment Effective Techniques: Sentence completion;Phonemic cues;Semantic cues Non-Verbal Means of Communication: Gestures (uses gestures to increase others' understanding) Written Expression Dominant Hand: Right Written Expression: Exceptions to [facility] Copy Ability: Word Dictation Ability: Word Self Formulation Ability: Word   Oral / Motor Oral Motor/Sensory Function Overall Oral Motor/Sensory Function: Appears within functional limits for tasks assessed Motor Speech Overall Motor Speech: Appears within functional limits for tasks assessed   GO     Kern Reap, MA, CCC-SLP 09/24/2013, 2:03 PM

## 2013-09-25 LAB — URINE CULTURE
Colony Count: NO GROWTH
Culture: NO GROWTH

## 2013-09-25 LAB — GLUCOSE, CAPILLARY
Glucose-Capillary: 331 mg/dL — ABNORMAL HIGH (ref 70–99)
Glucose-Capillary: 215 mg/dL — ABNORMAL HIGH (ref 70–99)
Glucose-Capillary: 334 mg/dL — ABNORMAL HIGH (ref 70–99)
Glucose-Capillary: 247 mg/dL — ABNORMAL HIGH (ref 70–99)
Glucose-Capillary: 278 mg/dL — ABNORMAL HIGH (ref 70–99)

## 2013-09-25 LAB — CBC
HCT: 52.7 % — ABNORMAL HIGH (ref 39.0–52.0)
Hemoglobin: 17.5 g/dL — ABNORMAL HIGH (ref 13.0–17.0)
MCH: 32.3 pg (ref 26.0–34.0)
MCHC: 33.2 g/dL (ref 30.0–36.0)
MCV: 97.2 fL (ref 78.0–100.0)
Platelets: 197 10*3/uL (ref 150–400)
RBC: 5.42 MIL/uL (ref 4.22–5.81)
RDW: 14.5 % (ref 11.5–15.5)
WBC: 12 10*3/uL — ABNORMAL HIGH (ref 4.0–10.5)

## 2013-09-25 MED ORDER — ASPIRIN EC 81 MG PO TBEC
81.0000 mg | DELAYED_RELEASE_TABLET | Freq: Every day | ORAL | Status: DC
  Administered 2013-09-26 – 2013-09-28 (×3): 81 mg via ORAL
  Filled 2013-09-25 (×3): qty 1

## 2013-09-25 MED ORDER — CLOPIDOGREL BISULFATE 75 MG PO TABS
75.0000 mg | ORAL_TABLET | Freq: Every day | ORAL | Status: DC
  Administered 2013-09-25 – 2013-09-28 (×4): 75 mg via ORAL
  Filled 2013-09-25 (×4): qty 1

## 2013-09-25 MED ORDER — ATORVASTATIN CALCIUM 40 MG PO TABS
40.0000 mg | ORAL_TABLET | Freq: Every day | ORAL | Status: DC
  Administered 2013-09-25 – 2013-09-28 (×4): 40 mg via ORAL
  Filled 2013-09-25 (×4): qty 1

## 2013-09-25 NOTE — Progress Notes (Signed)
Pt stated that he had BM with bright red blood. Pt concerned due to recent biopsy of prostate. Pt flushed specimen prior to notifying RN. Pt stated it was approximately a "few tablespoons" of bright red blood. PA notified. PA stated they would order labs. Will continue to monitor pt.

## 2013-09-25 NOTE — Progress Notes (Signed)
Stroke Team Progress Note  HISTORY  68 y.o. male who had just returned from lunch 09/23/2013 after a Prostate biopsy when his wife noted he had sudden onset of right arm and leg weakness, became mute and not following commands. EMS was called and found patient as noted prior and had a BP 218/110. Patient was brought to [facility] hospital where he was noted to have a left gaze preference, not crossing midline, mute and not following commands, right arm drift and right arm and leg weakness. Initial CT head was negative.  S/p TPA at 16:45. NIHSS 19 on admission.     SUBJECTIVE There no family members present. The patient's speech is significantly improved. The patient reported to the nurse that he had some bright red blood during his last bowel movement. He had a recent prostate biopsy.  OBJECTIVE Most recent Vital Signs: Filed Vitals:   09/24/13 1900 09/24/13 2146 09/25/13 0123 09/25/13 0521  BP: 168/85 140/75 146/70 152/87  Pulse: 76 92 69 77  Temp: 99.1 F (37.3 C) 99.5 F (37.5 C) 98.2 F (36.8 C) 98.5 F (36.9 C)  TempSrc:  Oral Oral Oral  Resp: 17 18 18 18   Height:  5\' 10"  (1.778 m)    Weight:      SpO2: 96% 99% 98% 100%   CBG (last 3)   Recent Labs  09/24/13 2110 09/24/13 2307 09/25/13 0645  GLUCAP 311* 331* 215*    IV Fluid Intake:   . sodium chloride 75 mL/hr at 09/24/13 2223    MEDICATIONS  . antiseptic oral rinse  7 mL Mouth Rinse BID  . aspirin  324 mg Oral Daily  . insulin aspart  0-15 Units Subcutaneous TID WC  . insulin aspart  0-5 Units Subcutaneous QHS  . pantoprazole  40 mg Oral Daily   PRN:  acetaminophen, acetaminophen, labetalol, senna-docusate  Diet:  Carb Control  Activity:  Bathroom privileges DVT Prophylaxis:  SCDs  CLINICALLY SIGNIFICANT STUDIES Basic Metabolic Panel:   Recent Labs Lab 09/23/13 1626 09/23/13 1640  NA 141 141  K 4.1 4.4  CL 103 102  CO2  --  26  GLUCOSE 189* 179*  BUN 26* 20  CREATININE 1.50* 1.43*  CALCIUM  --  9.0    Liver Function Tests: No results found for this basename: AST, ALT, ALKPHOS, BILITOT, PROT, ALBUMIN,  in the last 168 hours CBC:   Recent Labs Lab 09/23/13 1640 09/23/13 1700 09/23/13 1950  WBC 8.4  --   --   NEUTROABS 5.0  --   --   HGB 17.8* 17.8* 17.8*  HCT 53.9* 53.4* 53.9*  MCV 96.8  --   --   PLT 244  --   --    Coagulation:   Recent Labs Lab 09/23/13 1640  LABPROT 14.0  INR 1.08   Cardiac Enzymes:   Recent Labs Lab 09/23/13 1640  TROPONINI <0.30   Urinalysis:   Recent Labs Lab 09/23/13 1723  COLORURINE AMBER*  LABSPEC 1.022  PHURINE 5.0  GLUCOSEU NEGATIVE  HGBUR LARGE*  BILIRUBINUR SMALL*  KETONESUR NEGATIVE  PROTEINUR 100*  UROBILINOGEN 1.0  NITRITE NEGATIVE  LEUKOCYTESUR NEGATIVE   Lipid Panel    Component Value Date/Time   CHOL 124 09/24/2013 0224   TRIG 104 09/24/2013 0224   HDL 33* 09/24/2013 0224   CHOLHDL 3.8 09/24/2013 0224   VLDL 21 09/24/2013 0224   LDLCALC 70 09/24/2013 0224   HgbA1C  Lab Results  Component Value Date   HGBA1C 8.5* 09/24/2013  Urine Drug Screen:   No results found for this basename: labopia,  cocainscrnur,  labbenz,  amphetmu,  thcu,  labbarb    Alcohol Level: No results found for this basename: ETH,  in the last 168 hours  Ct Angio Head and Neck W/cm &/or Wo Cm 09/23/2013    1. Mild to moderate generalized white matter disease is again seen.  2. No focal cortical infarct is evident.  3. Mild atherosclerotic calcifications within the cavernous carotid arteries without significant stenosis.  4. Atherosclerotic calcifications in the left carotid bifurcation without significant stenosis.  5. Tortuosity of cervical internal carotid arteries is worse on the left. There is no significant stenosis.  6. Multilevel degenerative endplate changes of cervical spine as described.      Ct Head Wo Contrast 09/23/2013    No acute intracranial findings.  Minimal chronic ischemic microvascular disease.      Mr Brain Wo  Contrast 09/24/2013    1. 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  2. Additional acute/subacute infarcts involving the posterior left frontal lobe and superior left temporal lobe may be slightly older.  3. Extensive white matter disease. This likely reflects the sequela of chronic microvascular ischemia.  4. Asymmetric attenuation of left MCA branch vessels corresponding with the areas of infarct.  5. Signal loss in the vertebrobasilar system is artifactual based on appearance and comparison with prior study.       Carotid Doppler - See CTA of head and neck.  2D Echocardiogram  ejection fraction 55%. No cardiac source of emboli identified.  CXR    EKG  normal EKG, normal sinus rhythm, unchanged from previous tracings. For complete results please see formal report.   Therapy Recommendations - PT recommends no further therapy. Speech therapist recommends CIR. OT eval pending.   Physical Exam   Physical Exam  Constitutional: He appears well-developed and well-nourished.  HENT:  Head: Normocephalic.  Cardiovascular: Normal rate and regular rhythm.  Respiratory: Effort normal and breath sounds normal.  GI: Soft. Bowel sounds are normal. He exhibits no distension. There is no tenderness.   Mental Status:  Alert. Expressive aphasia is much improved. Cranial Nerves:  II: Discs flat bilaterally; Visual fields intact III,IV, VI: ptosis not present, eyes do not cross the midline to the right and have a forced gaze deviation to the left  V,VII: smile asymmetric asymmetric on the right, no wince to pain on the right  VIII: hearing normal bilaterally  IX,X: gag reflex present  XI: bilateral shoulder shrug  XII: midline tongue extension without atrophy or fasciculations Motor:  Right : Upper extremity 5/5 Left: Upper extremity 5/5  Lower extremity 5/5 Lower extremity 5/5  Tone and bulk:normal tone throughout; no atrophy noted   Sensory: no response to noxious stimuli on the right arm and leg  Deep Tendon Reflexes:  Right: Upper Extremity Left: Upper extremity  biceps (C-5 to C-6) 2/4 biceps (C-5 to C-6) 2/4  tricep (C7) 2/4 triceps (C7) 2/4  Brachioradialis (C6) 2/4 Brachioradialis (C6) 2/4  Lower Extremity Lower Extremity  quadriceps (L-2 to L-4) 1/4 quadriceps (L-2 to L-4) 1/4  Achilles (S1) 0/4 Achilles (S1) 0/4  Plantars:  Right: up going Left: downgoing  Cerebellar:  Unable to assess  Gait: unable to assess  CV: pulses palpable throughout    ASSESSMENT James Mcclain is a 68 y.o. male presenting to ED with sudden onset of mutism, right facial droop, right  arm and leg paresis and left gaze preference. Left brain infarct suspected. S/p TPA with significant improvement. No fields cuts. NIHSS 5. 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 Additional acute/subacute infarcts involving the posterior left frontal lobe and superior left temporal lobe may be slightly older. Aspirin 81 mg and Plavix 75 mg daily prior to admission. Now on aspirin 325 mg daily.   Cholesterol 124 LDL 70 - Lipitor prior to admission  Hypertension history with blood pressure 218/110 on admission  Diabetes mellitus - hemoglobin A1c 8.5 (goal less than 7)  Prostate cancer - recent biopsy  Hemoglobin 17.8; hematocrit 53.9  Mild renal insufficiency  Status post TPA therapy  Patient reports small amount of bright red blood per rectum during last bowel movement.     Hospital day # 2  TREATMENT/PLAN  Continue ASA 81 and plavix 75 later today after imaging confirms no signs of hemorrhagic conversion  Start lovenox 24 hrs post TPA  Physical therapist recommends no further therapy. Speech therapist recommends inpatient rehabilitation. OT eval pending.  Elevated H/H likely hemoconcentr3ed and possibly from dehydration.  SBP <180  MRI 24 hrs post  TPA  Resume Lipitor  Needs better glucose control  Monitor hemoglobin and hematocrit.  DC Foley catheter.  CIR consult.    Mikey Bussing PA-C [facility] Pager [phone] 09/25/2013, 9:48 AM  I have personally examined this patient, reviewed notes, independently viewed imaging studies, participated in medical decision making and plan of care. Awaiting placement. ? Blood per rectum. H/H f/up. Significant improvement since admission.  Leotis Pain     SIGNED    To contact Stroke Continuity provider, please refer to [email or web address] After hours, contact General Neurology

## 2013-09-25 NOTE — Evaluation (Signed)
Physical Therapy Evaluation Patient Details Name: James Mcclain MRN: [id number] DOB: 1946-01-09 Today's Date: 09/25/2013   History of Present Illness  Patient is a 68 yo male admitted 09/23/13 with Rt-sided weakness/numbness, global aphasia, Lt gaze preference and BP of 218/110.  Patient with Lt CVA with NIHSS of 19.  Patient was given tPA at 16:45 on 09/23/13.  Is not cleared for activity today.  Clinical Impression  Patient is independent with all mobility and gait.  Good balance with gait.  Did note language difficulties - see SLP evaluation.  No further acute PT need identified - PT will sign off.    Follow Up Recommendations No PT follow up;Supervision - Intermittent    Equipment Recommendations  None recommended by PT    Recommendations for Other Services       Precautions / Restrictions Precautions Precautions: None Restrictions Weight Bearing Restrictions: No      Mobility  Bed Mobility Overal bed mobility: Independent                Transfers Overall transfer level: Independent Equipment used: None                Ambulation/Gait Ambulation/Gait assistance: Independent Ambulation Distance (Feet): 300 Feet Assistive device: None Gait Pattern/deviations: WFL(Within Functional Limits) Gait velocity: WFL Gait velocity interpretation: at or above normal speed for age/gender General Gait Details: Patient demonstrates good gait pattern, balance, and speed.  No assistance needed.  Stairs            Wheelchair Mobility    Modified Rankin (Stroke Patients Only) Modified Rankin (Stroke Patients Only) Pre-Morbid Rankin Score: No symptoms Modified Rankin: No significant disability (speech)     Balance Overall balance assessment: Independent                                           Pertinent Vitals/Pain     Home Living Family/patient expects to be discharged to:: Private residence Living Arrangements: Spouse/significant  other;Children Available Help at Discharge: Family;Available 24 hours/day Type of Home: House Home Access: Stairs to enter Entrance Stairs-Rails: None Entrance Stairs-Number of Steps: 1 Home Layout: One level Home Equipment: None      Prior Function Level of Independence: Independent         Comments: Drives.  Is retired     Journalist, newspaper   Dominant Hand: Right    Extremity/Trunk Assessment   Upper Extremity Assessment: Overall WFL for tasks assessed           Lower Extremity Assessment: Overall WFL for tasks assessed (Strength symmetrical)      Cervical / Trunk Assessment: Normal  Communication   Communication: Expressive difficulties;Receptive difficulties  Cognition Arousal/Alertness: Awake/alert Behavior During Therapy: WFL for tasks assessed/performed Overall Cognitive Status: Difficult to assess Area of Impairment: Following commands     Memory: Decreased short-term memory (Does not remember day of admission) Following Commands: Follows one step commands inconsistently (Question due to receptive communication)       General Comments: Patient unable to use phone to make a call.  Difficulty with following directions at times ?due to language difficulties.  See SLP evaluation.    General Comments      Exercises        Assessment/Plan    PT Assessment Patent does not need any further PT services  PT Diagnosis     PT Problem List  PT Treatment Interventions     PT Goals (Current goals can be found in the Care Plan section) Acute Rehab PT Goals PT Goal Formulation: No goals set, d/c therapy    Frequency     Barriers to discharge        Co-evaluation               End of Session Equipment Utilized During Treatment: Gait belt Activity Tolerance: Patient tolerated treatment well Patient left: in chair;with call bell/phone within reach Nurse Communication: Mobility status (Independent with mobility/gait)         Time:  6294-7654 PT Time Calculation (min): 18 min   Charges:   PT Evaluation $Initial PT Evaluation Tier I: 1 Procedure PT Treatments $Gait Training: 8-22 mins   PT G CodesDespina Pole 09/25/2013, 9:12 AM Carita Pian. Sanjuana Kava, [place] Pager [phone]

## 2013-09-26 DIAGNOSIS — E119 Type 2 diabetes mellitus without complications: Secondary | ICD-10-CM

## 2013-09-26 DIAGNOSIS — E785 Hyperlipidemia, unspecified: Secondary | ICD-10-CM

## 2013-09-26 DIAGNOSIS — I10 Essential (primary) hypertension: Secondary | ICD-10-CM

## 2013-09-26 LAB — CBC
HCT: 51.4 % (ref 39.0–52.0)
Hemoglobin: 16.9 g/dL (ref 13.0–17.0)
MCH: 32.2 pg (ref 26.0–34.0)
MCHC: 32.9 g/dL (ref 30.0–36.0)
MCV: 97.9 fL (ref 78.0–100.0)
Platelets: 207 10*3/uL (ref 150–400)
RBC: 5.25 MIL/uL (ref 4.22–5.81)
RDW: 14.6 % (ref 11.5–15.5)
WBC: 12.4 10*3/uL — ABNORMAL HIGH (ref 4.0–10.5)

## 2013-09-26 LAB — GLUCOSE, CAPILLARY
Glucose-Capillary: 235 mg/dL — ABNORMAL HIGH (ref 70–99)
Glucose-Capillary: 283 mg/dL — ABNORMAL HIGH (ref 70–99)
Glucose-Capillary: 268 mg/dL — ABNORMAL HIGH (ref 70–99)
Glucose-Capillary: 178 mg/dL — ABNORMAL HIGH (ref 70–99)

## 2013-09-26 MED ORDER — HEPARIN SODIUM (PORCINE) 5000 UNIT/ML IJ SOLN
5000.0000 [IU] | Freq: Three times a day (TID) | INTRAMUSCULAR | Status: DC
  Administered 2013-09-26 – 2013-09-28 (×6): 5000 [IU] via SUBCUTANEOUS
  Filled 2013-09-26 (×6): qty 1

## 2013-09-26 MED ORDER — CARVEDILOL 6.25 MG PO TABS
6.2500 mg | ORAL_TABLET | Freq: Two times a day (BID) | ORAL | Status: DC
  Administered 2013-09-26 – 2013-09-27 (×2): 6.25 mg via ORAL
  Filled 2013-09-26 (×2): qty 1

## 2013-09-26 MED ORDER — LOSARTAN POTASSIUM 50 MG PO TABS
100.0000 mg | ORAL_TABLET | Freq: Every day | ORAL | Status: DC
  Administered 2013-09-26 – 2013-09-28 (×3): 100 mg via ORAL
  Filled 2013-09-26 (×3): qty 2

## 2013-09-26 MED ORDER — METFORMIN HCL 500 MG PO TABS
1000.0000 mg | ORAL_TABLET | Freq: Two times a day (BID) | ORAL | Status: DC
  Administered 2013-09-26 – 2013-09-28 (×4): 1000 mg via ORAL
  Filled 2013-09-26 (×4): qty 2

## 2013-09-26 MED ORDER — INSULIN GLARGINE 100 UNIT/ML ~~LOC~~ SOLN
35.0000 [IU] | Freq: Every day | SUBCUTANEOUS | Status: DC
  Administered 2013-09-26 – 2013-09-27 (×2): 35 [IU] via SUBCUTANEOUS
  Filled 2013-09-26 (×3): qty 0.35

## 2013-09-26 NOTE — Progress Notes (Signed)
Stroke Team Progress Note  HISTORY  68 y.o. male who had just returned from lunch 09/23/2013 after a Prostate biopsy when his wife noted he had sudden onset of right arm and leg weakness, became mute and not following commands. EMS was called and found patient as noted prior and had a BP 218/110. Patient was brought to [facility] hospital where he was noted to have a left gaze preference, not crossing midline, mute and not following commands, right arm drift and right arm and leg weakness. Initial CT head was negative.  S/p TPA at 16:45. NIHSS 19 on admission.    SUBJECTIVE No acute events overnight. Family is at the bedside. The patient is alert and conversant. His symptoms much improved.   OBJECTIVE Most recent Vital Signs: Filed Vitals:   09/25/13 2111 09/26/13 0152 09/26/13 0530 09/26/13 0848  BP: 167/84 173/91 181/93 177/88  Pulse: 81 91 95 91  Temp: 98.4 F (36.9 C) 97.8 F (36.6 C) 98.1 F (36.7 C) 98.5 F (36.9 C)  TempSrc: Oral Oral Oral Oral  Resp: 18 16 18 20   Height:      Weight:      SpO2: 99% 96% 99% 100%   CBG (last 3)   Recent Labs  09/25/13 2115 09/26/13 0652 09/26/13 1200  GLUCAP 278* 235* 283*    IV Fluid Intake:   . sodium chloride 75 mL/hr at 09/24/13 2223    MEDICATIONS  . antiseptic oral rinse  7 mL Mouth Rinse BID  . aspirin EC  81 mg Oral Daily  . atorvastatin  40 mg Oral q1800  . clopidogrel  75 mg Oral Daily  . insulin aspart  0-15 Units Subcutaneous TID WC  . insulin aspart  0-5 Units Subcutaneous QHS  . pantoprazole  40 mg Oral Daily   PRN:  acetaminophen, acetaminophen, labetalol, senna-docusate  Diet:  Carb Control  Activity:  Bathroom privileges DVT Prophylaxis:  SCDs  CLINICALLY SIGNIFICANT STUDIES Basic Metabolic Panel:   Recent Labs Lab 09/23/13 1626 09/23/13 1640  NA 141 141  K 4.1 4.4  CL 103 102  CO2  --  26  GLUCOSE 189* 179*  BUN 26* 20  CREATININE 1.50* 1.43*  CALCIUM  --  9.0   Liver Function Tests: No results  found for this basename: AST, ALT, ALKPHOS, BILITOT, PROT, ALBUMIN,  in the last 168 hours CBC:   Recent Labs Lab 09/23/13 1640  09/25/13 1516 09/26/13 0325  WBC 8.4  --  12.0* 12.4*  NEUTROABS 5.0  --   --   --   HGB 17.8*  < > 17.5* 16.9  HCT 53.9*  < > 52.7* 51.4  MCV 96.8  --  97.2 97.9  PLT 244  --  197 207  < > = values in this interval not displayed. Coagulation:   Recent Labs Lab 09/23/13 1640  LABPROT 14.0  INR 1.08   Cardiac Enzymes:   Recent Labs Lab 09/23/13 1640  TROPONINI <0.30   Urinalysis:   Recent Labs Lab 09/23/13 1723  COLORURINE AMBER*  LABSPEC 1.022  PHURINE 5.0  GLUCOSEU NEGATIVE  HGBUR LARGE*  BILIRUBINUR SMALL*  KETONESUR NEGATIVE  PROTEINUR 100*  UROBILINOGEN 1.0  NITRITE NEGATIVE  LEUKOCYTESUR NEGATIVE   Lipid Panel    Component Value Date/Time   CHOL 124 09/24/2013 0224   TRIG 104 09/24/2013 0224   HDL 33* 09/24/2013 0224   CHOLHDL 3.8 09/24/2013 0224   VLDL 21 09/24/2013 0224   LDLCALC 70 09/24/2013 0224  HgbA1C  Lab Results  Component Value Date   HGBA1C 8.5* 09/24/2013    Urine Drug Screen:   No results found for this basename: labopia,  cocainscrnur,  labbenz,  amphetmu,  thcu,  labbarb    Alcohol Level: No results found for this basename: ETH,  in the last 168 hours  Ct Angio Head and Neck W/cm &/or Wo Cm 09/23/2013    1. Mild to moderate generalized white matter disease is again seen.  2. No focal cortical infarct is evident.  3. Mild atherosclerotic calcifications within the cavernous carotid arteries without significant stenosis.  4. Atherosclerotic calcifications in the left carotid bifurcation without significant stenosis.  5. Tortuosity of cervical internal carotid arteries is worse on the left. There is no significant stenosis.  6. Multilevel degenerative endplate changes of cervical spine as described.     Ct Head Wo Contrast 09/23/2013    No acute intracranial findings.  Minimal chronic ischemic microvascular  disease.    Mr Brain Wo Contrast 09/24/2013    1. 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  2. Additional acute/subacute infarcts involving the posterior left frontal lobe and superior left temporal lobe may be slightly older.  3. Extensive white matter disease. This likely reflects the sequela of chronic microvascular ischemia.  4. Asymmetric attenuation of left MCA branch vessels corresponding with the areas of infarct.  5. Signal loss in the vertebrobasilar system is artifactual based on appearance and comparison with prior study.     Carotid Doppler - See CTA of head and neck.  2D Echocardiogram  ejection fraction 55%. No cardiac source of emboli identified.  EKG  normal EKG, normal sinus rhythm, unchanged from previous tracings. For complete results please see formal report.   Telemetry - sinus rhythm so far  Therapy Recommendations - PT recommends no further therapy. Speech therapist recommends CIR vs outpt Speech. OT eval pending.  Physical Exam   Physical Exam  Constitutional: He appears well-developed and well-nourished.  HENT:  Head: Normocephalic.  Cardiovascular: Normal rate and regular rhythm.  Respiratory: Effort normal and breath sounds normal.  GI: Soft. Bowel sounds are normal. He exhibits no distension. There is no tenderness.   Mental Status:  Alert, awake follows commands. Expressive aphasia is much improved. However, still has difficulty with naming, especially with naming parts of objects. Repetition is mildly impaired. Cranial Nerves:  II: Discs flat bilaterally; Visual fields intact III,IV, VI: ptosis not present, eyes do not cross the midline to the right and have a forced gaze deviation to the left  V,VII: smile asymmetric asymmetric on the right, no wince to pain on the right  VIII: hearing normal bilaterally  IX,X: gag reflex present  XI: bilateral shoulder shrug  XII: midline tongue  extension without atrophy or fasciculations Motor:  Right : Upper extremity 5/5 Left: Upper extremity 5-/5 with pronator drift. Lower extremity 5/5 Lower extremity 5/5  Tone and bulk:normal tone throughout; no atrophy noted  Sensory: no response to noxious stimuli on the right arm and leg  Deep Tendon Reflexes:  Right: Upper Extremity Left: Upper extremity  biceps (C-5 to C-6) 2/4 biceps (C-5 to C-6) 2/4  tricep (C7) 2/4 triceps (C7) 2/4  Brachioradialis (C6) 2/4 Brachioradialis (C6) 2/4  Lower Extremity Lower Extremity  quadriceps (L-2 to L-4) 1/4 quadriceps (L-2 to L-4) 1/4  Achilles (S1) 0/4 Achilles (S1) 0/4  Cerebellar: intact FTN  Gait: not tested  CV: pulses palpable  throughout   ASSESSMENT Mr. ANTONEY BIVEN is a 68 y.o. male presenting to ED with sudden onset of mutism, right facial droop, right arm and leg paresis and left gaze preference. Left brain infarct suspected. S/p TPA with significant improvement. No fields cuts. NIHSS 5. 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 Additional acute/subacute infarcts involving the posterior left frontal lobe and superior left temporal lobe may be slightly older. Likely embolic. However, he also has multiple risk factors for stroke, including HTN, DM and HLD and overweight. Of note, he stated that he supposed to be on plavix but he stopped for his prostate biopsy and later he felt sick. Now on aspirin 81 mg and Plavix 75 mg daily for secondary stroke prevention. He needs TEE and loop recorder for embolic stroke work up.   Cholesterol 124 LDL 70 - on Lipitor 40 mg daily prior to admission  Hypertension history with blood pressure 218/110 on admission, in last 24 hours 150s-180s/70s-90s, restart Coreg and losartan   Diabetes mellitus - hemoglobin A1c 8.5 (goal less than 6.5)  Prostate cancer - recent biopsy  Mild renal insufficiency  Status post TPA therapy  Patient  reports small amount of bright red blood per rectum during last bowel movement.  overweight  Hospital day # 3  TREATMENT/PLAN Acute stroke  Carotid US <39% stenosis bilat ICAs  Echo: no definite source of emboli identified, EF normal  Order TEE and Loop today, NPO after midnight. Discussed with Cards.  Continue ASA 81 and plavix 75 for 3 months and then plavix monotherapy  Started heparin subq for DVT prophylaxis  Physical therapist recommends no further therapy. Speech therapist recommends ongoing therapy. OT eval pending.  CIR consult ordered due to Speech recs for Inpatient Rehab, per Rehab PA recs are d/c home.  HTN  BP still high, will restart Coreg and losartan  Close monitor  BP goal 130/80  HLD  Continue Lipitor at home dose  LDL 70  DM type II  restart home DM meds but need to be adjusted for better glucose control.   HgbA1C 8.5, goal <6.5.   Start lantus today Qhs   Start metformin 1000mg  bid with meals  SSI.   Elevated Hb and Hct  likely due to hemoconcentration  Repeat Hb 16.9  Monitor to rule out polycythemia vera  Denies smoking  SIGNED Delbert Phenix, MSN, Trey Paula, [facility] Stroke Team [phone]  09/26/2013, 12:10 PM   I, the attending vascular neurologist, have personally obtained a history, examined the patient, evaluated laboratory data, individually viewed imaging studies, and formulated the assessment and plan of care.  I have made any additions or clarifications directly to the above note and agree with the findings and plan as currently documented.   Rosalin Hawking, MD PhD 09/26/2013 5:27 PM    To contact Stroke Continuity provider, please refer to [email or web address] After hours, contact General Neurology

## 2013-09-26 NOTE — Progress Notes (Signed)
Physical medicine and rehabilitation consult requested with chart reviewed. Physical therapy evaluation completed 09/25/2013. Patient is independent ambulating 300 feet without the use of assistive device and no further physical therapy is recommended. Patient will not need inpatient rehabilitation services at this time. Recommendations are discharged to home. Home on formal rehabilitation consult at this time

## 2013-09-26 NOTE — Clinical Documentation Improvement (Signed)
Clinical Documentation Clarification #1:   Possible Clinical Conditions?   _______Diabetes Type 1  or 2 _______Controlled or Uncontrolled  Manifestations:  _______DM retinopathy  _______DM PVD _______DM neuropathy   _______DM nephropathy  _______Other Condition _______Cannot Clinically determine     Risk Factors: Patient with a history of diabetes mellitus per 8/01 progress notes. Needs better glucose control; DM, hgbA1c 8.5(goal less than 7), per 8/02 progress notes.   Labs: 8/01:  Hgb A1c: 8.5. 8/01:  Mpg: 197. 8/01:  Glucose, blood: 179. ________________________________________________________________________________________________ Clinical Documentation Clarification #2:   Possible Clinical Conditions?    Accelerated Hypertension Malignant Hypertension  Other Condition Cannot Clinically Determine   Risk Factors: BP 218/110 per 7/31 progress notes. BP's 173-181/84-93 per 8/03 doc flowsheets.    Thank You, Theron Arista, Clinical Documentation Specialist:  [phone]  [place] [employer]

## 2013-09-26 NOTE — Progress Notes (Signed)
SPOKE WITH NEURO SERVICE Our protocol has been TEE to precede Loop monitor  They will confer and we will proceed based on their request  Will NOT see today

## 2013-09-26 NOTE — Progress Notes (Signed)
CARE MANAGEMENT NOTE 09/26/2013  Patient:  James Mcclain, James Mcclain   Account Number:  [id number]  Date Initiated:  09/26/2013  Documentation initiated by:  Olga Coaster  Subjective/Objective Assessment:   ADMITTED WITH STROKE     Action/Plan:   CM FOLLOWING FOR DCP   Anticipated DC Date:  09/28/2013   Anticipated DC Plan:  [place]  CM consult         Status of service:  In process, will continue to follow Medicare Important Message given?  YES (If response is "NO", the following Medicare IM given date fields will be blank) Date Medicare IM given:  09/26/2013 Medicare IM given by:  Olga Coaster Per UR Regulation:  Reviewed for med. necessity/level of care/duration of stay  Comments:  8/3/2015Mindi Slicker RN,BSN,MHA [phone]

## 2013-09-26 NOTE — Evaluation (Signed)
Occupational Therapy Evaluation Patient Details Name: James Mcclain MRN: [id number] DOB: Jan 10, 1946 Today's Date: 09/26/2013    History of Present Illness Patient is a 68 yo male admitted 09/23/13 with Rt-sided weakness/numbness, global aphasia, Lt gaze preference and BP of 218/110. Patient with Lt CVA with NIHSS of 19. Patient was given tPA at 16:45 on 09/23/13. Is not cleared for activity today. MRI revealed Acute cortical infarct involving the left parietal lobe with a 7 mm area of focal petechial hemorrhage. Additional acute/subacute infarcts involving the posterior left frontal lobe and superior left temporal lobe may be slightly older.   Clinical Impression   Pt s/p above. Pt with apparent visual deficits on right side in inferior quadrant. Recommending pt get a full visual field assessment at opthamologist and then possibly followup with Outpatient OT. Contacted case manager about this. Educated on signs/symptoms of stroke and importance of getting help right away. Also, recommended pt avoid canned foods due to increase sodium. Wife present for session as well for education. Wrote down information for pt/wife. Recommended NO driving. Educated on safety concern with apparent visual deficit and discussed scanning each room prior to entering and having family pick up throw rugs/clutter/cords to avoid any falls.     Follow Up Recommendations  Outpatient OT (full visual field assessment-[place] 120.3)   Equipment Recommendations  None recommended by OT    Recommendations for Other Services       Precautions / Restrictions Precautions Precautions: None Restrictions Weight Bearing Restrictions: No      Mobility Bed Mobility Overal bed mobility: Independent                Transfers Overall transfer level: Independent                    Balance                                            ADL Overall ADL's : Modified Independent                                             Vision      Visual fields: pt with apparent deficit on right side in inferior quadrant Tracking/Visual Pursuits: Able to track stimulus in all quads without difficulty Saccades: Within functional limits           Perception     Praxis      Pertinent Vitals/Pain No apparent distress.     Hand Dominance Right   Extremity/Trunk Assessment Upper Extremity Assessment Upper Extremity Assessment: Overall WFL for tasks assessed   Lower Extremity Assessment Lower Extremity Assessment: Defer to PT evaluation       Communication Communication Communication: Expressive difficulties;Receptive difficulties   Cognition Arousal/Alertness: Awake/alert Behavior During Therapy: WFL for tasks assessed/performed Overall Cognitive Status: Difficult to assess (impaired speech)                     General Comments       Exercises       Shoulder Instructions      Home Living Family/patient expects to be discharged to:: Private residence Living Arrangements: Spouse/significant other;Children Available Help at Discharge: Family;Available 24 hours/day Type of Home: House Home Access: Stairs to enter Entrance  Stairs-Number of Steps: 1 Entrance Stairs-Rails: None Home Layout: One level               Home Equipment: None      Lives With: Spouse    Prior Functioning/Environment Level of Independence: Independent        Comments: Drives.  Is retired    OT Diagnosis: Disturbance of vision   OT Problem List: Impaired vision/perception   OT Treatment/Interventions:      OT Goals(Current goals can be found in the care plan section)    OT Frequency:     Barriers to D/C:            Co-evaluation              End of Session    Activity Tolerance: Patient tolerated treatment well Patient left: in bed;with family/visitor present   Time: 7793-9030 OT Time Calculation (min): 27 min Charges:  OT General  Charges $OT Visit: 1 Procedure OT Evaluation $Initial OT Evaluation Tier I: 1 Procedure OT Treatments $Therapeutic Activity: 8-22 mins G-CodesBenito Mccreedy OTR/L [phone] 09/26/2013, 5:15 PM

## 2013-09-26 NOTE — Progress Notes (Addendum)
Speech Language Pathology Treatment: Cognitive-Linquistic  Patient Details Name: James Mcclain MRN: [id number] DOB: Feb 03, 1946 Today's Date: 09/26/2013 Time: 6256-3893 SLP Time Calculation (min): 23 min  Assessment / Plan / Recommendation Clinical Impression  Patient continues to present with an expressive > receptive aphasia. Prior to engaging in therapeutic activities, he demonstrated anticipatory awareness and basic problem solving by using his call bell independently to turn off the TV. Pt completed confrontational naming task with 70% accuracy, increased to 90% with sentence completion and phonemic cues. Verbal expression was marked by phonemic paraphasias and neologisms, with Min-Mod cues for awareness during spontaneous conversation, structured linguistic tasks, and reading at the phrase level. SLP provided Max multimodal cueing during divergent naming task.  By the end of session, patient verbalized increased awareness of linguistic deficits. SLP encouraged supervision upon return home due to impairments, and provided education related to aphasia and recommendation for OP SLP services upon d/c.    HPI HPI: 68 y.o. male admitted 7/31 for R arm/ leg weakness/ mute/ not following commands. Head CT showed no acute findings, awaiting MRI. Bedside swallow eval ordered as pt is reportedly not responding to speech.   Pertinent Vitals n/a  SLP Plan  Continue with current plan of care    Recommendations                Oral Care Recommendations: Oral care BID Follow up Recommendations: Outpatient SLP;24 hour supervision/assistance Plan: Continue with current plan of care    GO       Germain Osgood, M.A. CCC-SLP [phone]  Germain Osgood 09/26/2013, 4:01 PM

## 2013-09-27 ENCOUNTER — Other Ambulatory Visit: Payer: Self-pay | Admitting: *Deleted

## 2013-09-27 ENCOUNTER — Encounter (HOSPITAL_COMMUNITY): Payer: Self-pay | Admitting: Gastroenterology

## 2013-09-27 ENCOUNTER — Encounter (HOSPITAL_COMMUNITY)
Admission: EM | Disposition: A | Discharge status: Home / Self Care | Payer: Self-pay | Source: Home / Self Care | Attending: Neurology

## 2013-09-27 DIAGNOSIS — I639 Cerebral infarction, unspecified: Secondary | ICD-10-CM

## 2013-09-27 DIAGNOSIS — I059 Rheumatic mitral valve disease, unspecified: Secondary | ICD-10-CM

## 2013-09-27 HISTORY — PX: TEE WITHOUT CARDIOVERSION: SHX5443

## 2013-09-27 LAB — GLUCOSE, CAPILLARY
Glucose-Capillary: 184 mg/dL — ABNORMAL HIGH (ref 70–99)
Glucose-Capillary: 153 mg/dL — ABNORMAL HIGH (ref 70–99)
Glucose-Capillary: 303 mg/dL — ABNORMAL HIGH (ref 70–99)
Glucose-Capillary: 186 mg/dL — ABNORMAL HIGH (ref 70–99)

## 2013-09-27 LAB — BASIC METABOLIC PANEL
Anion gap: 13 (ref 5–15)
BUN: 13 mg/dL (ref 6–23)
CO2: 27 mEq/L (ref 19–32)
Calcium: 8.6 mg/dL (ref 8.4–10.5)
Chloride: 103 mEq/L (ref 96–112)
Creatinine, Ser: 1.06 mg/dL (ref 0.50–1.35)
GFR calc Af Amer: 82 mL/min — ABNORMAL LOW (ref >90)
GFR calc non Af Amer: 71 mL/min — ABNORMAL LOW (ref >90)
Glucose, Bld: 159 mg/dL — ABNORMAL HIGH (ref 70–99)
Potassium: 3.7 mEq/L (ref 3.7–5.3)
Sodium: 143 mEq/L (ref 137–147)

## 2013-09-27 LAB — CBC
HCT: 54 % — ABNORMAL HIGH (ref 39.0–52.0)
Hemoglobin: 17.6 g/dL — ABNORMAL HIGH (ref 13.0–17.0)
MCH: 31.9 pg (ref 26.0–34.0)
MCHC: 32.6 g/dL (ref 30.0–36.0)
MCV: 97.8 fL (ref 78.0–100.0)
Platelets: 200 10*3/uL (ref 150–400)
RBC: 5.52 MIL/uL (ref 4.22–5.81)
RDW: 14.2 % (ref 11.5–15.5)
WBC: 9.7 10*3/uL (ref 4.0–10.5)

## 2013-09-27 SURGERY — ECHOCARDIOGRAM, TRANSESOPHAGEAL
Anesthesia: Moderate Sedation

## 2013-09-27 MED ORDER — BUTAMBEN-TETRACAINE-BENZOCAINE 2-2-14 % EX AERO
INHALATION_SPRAY | CUTANEOUS | Status: DC | PRN
  Administered 2013-09-27: 2 via TOPICAL

## 2013-09-27 MED ORDER — DIPHENHYDRAMINE HCL 50 MG/ML IJ SOLN
INTRAMUSCULAR | Status: AC
  Filled 2013-09-27: qty 1

## 2013-09-27 MED ORDER — SODIUM CHLORIDE 0.9 % IV SOLN
INTRAVENOUS | Status: DC
  Administered 2013-09-27: 500 mL via INTRAVENOUS

## 2013-09-27 MED ORDER — CARVEDILOL 12.5 MG PO TABS
12.5000 mg | ORAL_TABLET | Freq: Two times a day (BID) | ORAL | Status: DC
  Administered 2013-09-27 – 2013-09-28 (×3): 12.5 mg via ORAL
  Filled 2013-09-27 (×3): qty 1

## 2013-09-27 MED ORDER — FENTANYL CITRATE 0.05 MG/ML IJ SOLN
INTRAMUSCULAR | Status: AC
  Filled 2013-09-27: qty 2

## 2013-09-27 MED ORDER — FENTANYL CITRATE 0.05 MG/ML IJ SOLN
INTRAMUSCULAR | Status: DC | PRN
  Administered 2013-09-27: 25 ug via INTRAVENOUS

## 2013-09-27 MED ORDER — MIDAZOLAM HCL 10 MG/2ML IJ SOLN
INTRAMUSCULAR | Status: DC | PRN
  Administered 2013-09-27: 2 mg via INTRAVENOUS

## 2013-09-27 MED ORDER — MIDAZOLAM HCL 5 MG/ML IJ SOLN
INTRAMUSCULAR | Status: AC
  Filled 2013-09-27: qty 2

## 2013-09-27 NOTE — Progress Notes (Signed)
Talked to patient about outpatient therapy's, patient is agreeable to go to the [facility] in [place] for Outpatient OT / ST; referral made/ clinical information faxed; The rehab center will contact the patient for a start up date and time for therapy'sAneta Mins [phone]

## 2013-09-27 NOTE — H&P (View-Only) (Signed)
Stroke Team Progress Note  HISTORY  68 y.o. male who had just returned from lunch 09/23/2013 after a Prostate biopsy when his wife noted he had sudden onset of right arm and leg weakness, became mute and not following commands. EMS was called and found patient as noted prior and had a BP 218/110. Patient was brought to [facility] hospital where he was noted to have a left gaze preference, not crossing midline, mute and not following commands, right arm drift and right arm and leg weakness. Initial CT head was negative.  S/p TPA at 16:45. NIHSS 19 on admission.    SUBJECTIVE No acute events overnight. Family is at the bedside. The patient is alert and conversant. His symptoms much improved.   OBJECTIVE Most recent Vital Signs: Filed Vitals:   09/25/13 2111 09/26/13 0152 09/26/13 0530 09/26/13 0848  BP: 167/84 173/91 181/93 177/88  Pulse: 81 91 95 91  Temp: 98.4 F (36.9 C) 97.8 F (36.6 C) 98.1 F (36.7 C) 98.5 F (36.9 C)  TempSrc: Oral Oral Oral Oral  Resp: 18 16 18 20   Height:      Weight:      SpO2: 99% 96% 99% 100%   CBG (last 3)   Recent Labs  09/25/13 2115 09/26/13 0652 09/26/13 1200  GLUCAP 278* 235* 283*    IV Fluid Intake:   . sodium chloride 75 mL/hr at 09/24/13 2223    MEDICATIONS  . antiseptic oral rinse  7 mL Mouth Rinse BID  . aspirin EC  81 mg Oral Daily  . atorvastatin  40 mg Oral q1800  . clopidogrel  75 mg Oral Daily  . insulin aspart  0-15 Units Subcutaneous TID WC  . insulin aspart  0-5 Units Subcutaneous QHS  . pantoprazole  40 mg Oral Daily   PRN:  acetaminophen, acetaminophen, labetalol, senna-docusate  Diet:  Carb Control  Activity:  Bathroom privileges DVT Prophylaxis:  SCDs  CLINICALLY SIGNIFICANT STUDIES Basic Metabolic Panel:   Recent Labs Lab 09/23/13 1626 09/23/13 1640  NA 141 141  K 4.1 4.4  CL 103 102  CO2  --  26  GLUCOSE 189* 179*  BUN 26* 20  CREATININE 1.50* 1.43*  CALCIUM  --  9.0   Liver Function Tests: No results  found for this basename: AST, ALT, ALKPHOS, BILITOT, PROT, ALBUMIN,  in the last 168 hours CBC:   Recent Labs Lab 09/23/13 1640  09/25/13 1516 09/26/13 0325  WBC 8.4  --  12.0* 12.4*  NEUTROABS 5.0  --   --   --   HGB 17.8*  < > 17.5* 16.9  HCT 53.9*  < > 52.7* 51.4  MCV 96.8  --  97.2 97.9  PLT 244  --  197 207  < > = values in this interval not displayed. Coagulation:   Recent Labs Lab 09/23/13 1640  LABPROT 14.0  INR 1.08   Cardiac Enzymes:   Recent Labs Lab 09/23/13 1640  TROPONINI <0.30   Urinalysis:   Recent Labs Lab 09/23/13 1723  COLORURINE AMBER*  LABSPEC 1.022  PHURINE 5.0  GLUCOSEU NEGATIVE  HGBUR LARGE*  BILIRUBINUR SMALL*  KETONESUR NEGATIVE  PROTEINUR 100*  UROBILINOGEN 1.0  NITRITE NEGATIVE  LEUKOCYTESUR NEGATIVE   Lipid Panel    Component Value Date/Time   CHOL 124 09/24/2013 0224   TRIG 104 09/24/2013 0224   HDL 33* 09/24/2013 0224   CHOLHDL 3.8 09/24/2013 0224   VLDL 21 09/24/2013 0224   LDLCALC 70 09/24/2013 0224  HgbA1C  Lab Results  Component Value Date   HGBA1C 8.5* 09/24/2013    Urine Drug Screen:   No results found for this basename: labopia,  cocainscrnur,  labbenz,  amphetmu,  thcu,  labbarb    Alcohol Level: No results found for this basename: ETH,  in the last 168 hours  Ct Angio Head and Neck W/cm &/or Wo Cm 09/23/2013    1. Mild to moderate generalized white matter disease is again seen.  2. No focal cortical infarct is evident.  3. Mild atherosclerotic calcifications within the cavernous carotid arteries without significant stenosis.  4. Atherosclerotic calcifications in the left carotid bifurcation without significant stenosis.  5. Tortuosity of cervical internal carotid arteries is worse on the left. There is no significant stenosis.  6. Multilevel degenerative endplate changes of cervical spine as described.     Ct Head Wo Contrast 09/23/2013    No acute intracranial findings.  Minimal chronic ischemic microvascular  disease.    Mr Brain Wo Contrast 09/24/2013    1. 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  2. Additional acute/subacute infarcts involving the posterior left frontal lobe and superior left temporal lobe may be slightly older.  3. Extensive white matter disease. This likely reflects the sequela of chronic microvascular ischemia.  4. Asymmetric attenuation of left MCA branch vessels corresponding with the areas of infarct.  5. Signal loss in the vertebrobasilar system is artifactual based on appearance and comparison with prior study.     Carotid Doppler - See CTA of head and neck.  2D Echocardiogram  ejection fraction 55%. No cardiac source of emboli identified.  EKG  normal EKG, normal sinus rhythm, unchanged from previous tracings. For complete results please see formal report.   Telemetry - sinus rhythm so far  Therapy Recommendations - PT recommends no further therapy. Speech therapist recommends CIR vs outpt Speech. OT eval pending.  Physical Exam   Physical Exam  Constitutional: He appears well-developed and well-nourished.  HENT:  Head: Normocephalic.  Cardiovascular: Normal rate and regular rhythm.  Respiratory: Effort normal and breath sounds normal.  GI: Soft. Bowel sounds are normal. He exhibits no distension. There is no tenderness.   Mental Status:  Alert, awake follows commands. Expressive aphasia is much improved. However, still has difficulty with naming, especially with naming parts of objects. Repetition is mildly impaired. Cranial Nerves:  II: Discs flat bilaterally; Visual fields intact III,IV, VI: ptosis not present, eyes do not cross the midline to the right and have a forced gaze deviation to the left  V,VII: smile asymmetric asymmetric on the right, no wince to pain on the right  VIII: hearing normal bilaterally  IX,X: gag reflex present  XI: bilateral shoulder shrug  XII: midline tongue  extension without atrophy or fasciculations Motor:  Right : Upper extremity 5/5 Left: Upper extremity 5-/5 with pronator drift. Lower extremity 5/5 Lower extremity 5/5  Tone and bulk:normal tone throughout; no atrophy noted  Sensory: no response to noxious stimuli on the right arm and leg  Deep Tendon Reflexes:  Right: Upper Extremity Left: Upper extremity  biceps (C-5 to C-6) 2/4 biceps (C-5 to C-6) 2/4  tricep (C7) 2/4 triceps (C7) 2/4  Brachioradialis (C6) 2/4 Brachioradialis (C6) 2/4  Lower Extremity Lower Extremity  quadriceps (L-2 to L-4) 1/4 quadriceps (L-2 to L-4) 1/4  Achilles (S1) 0/4 Achilles (S1) 0/4  Cerebellar: intact FTN  Gait: not tested  CV: pulses palpable  throughout   ASSESSMENT Mr. CHAVIS TESSLER is a 68 y.o. male presenting to ED with sudden onset of mutism, right facial droop, right arm and leg paresis and left gaze preference. Left brain infarct suspected. S/p TPA with significant improvement. No fields cuts. NIHSS 5. 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 Additional acute/subacute infarcts involving the posterior left frontal lobe and superior left temporal lobe may be slightly older. Likely embolic. However, he also has multiple risk factors for stroke, including HTN, DM and HLD and overweight. Of note, he stated that he supposed to be on plavix but he stopped for his prostate biopsy and later he felt sick. Now on aspirin 81 mg and Plavix 75 mg daily for secondary stroke prevention. He needs TEE and loop recorder for embolic stroke work up.   Cholesterol 124 LDL 70 - on Lipitor 40 mg daily prior to admission  Hypertension history with blood pressure 218/110 on admission, in last 24 hours 150s-180s/70s-90s, restart Coreg and losartan   Diabetes mellitus - hemoglobin A1c 8.5 (goal less than 6.5)  Prostate cancer - recent biopsy  Mild renal insufficiency  Status post TPA therapy  Patient  reports small amount of bright red blood per rectum during last bowel movement.  overweight  Hospital day # 3  TREATMENT/PLAN Acute stroke  Carotid US <39% stenosis bilat ICAs  Echo: no definite source of emboli identified, EF normal  Order TEE and Loop today, NPO after midnight. Discussed with Cards.  Continue ASA 81 and plavix 75 for 3 months and then plavix monotherapy  Started heparin subq for DVT prophylaxis  Physical therapist recommends no further therapy. Speech therapist recommends ongoing therapy. OT eval pending.  CIR consult ordered due to Speech recs for Inpatient Rehab, per Rehab PA recs are d/c home.  HTN  BP still high, will restart Coreg and losartan  Close monitor  BP goal 130/80  HLD  Continue Lipitor at home dose  LDL 70  DM type II  restart home DM meds but need to be adjusted for better glucose control.   HgbA1C 8.5, goal <6.5.   Start lantus today Qhs   Start metformin 1000mg  bid with meals  SSI.   Elevated Hb and Hct  likely due to hemoconcentration  Repeat Hb 16.9  Monitor to rule out polycythemia vera  Denies smoking  SIGNED Delbert Phenix, MSN, Trey Paula, Roney Jaffe Stroke Team [phone]  09/26/2013, 12:10 PM   I, the attending vascular neurologist, have personally obtained a history, examined the patient, evaluated laboratory data, individually viewed imaging studies, and formulated the assessment and plan of care.  I have made any additions or clarifications directly to the above note and agree with the findings and plan as currently documented.   Rosalin Hawking, MD PhD 09/26/2013 5:27 PM    To contact Stroke Continuity provider, please refer to [email or web address] After hours, contact General Neurology

## 2013-09-27 NOTE — Interval H&P Note (Signed)
History and Physical Interval Note:  09/27/2013 1:04 PM  James Mcclain  has presented today for surgery, with the diagnosis of stroke  The various methods of treatment have been discussed with the patient and family. After consideration of risks, benefits and other options for treatment, the patient has consented to  Procedure(s): TRANSESOPHAGEAL ECHOCARDIOGRAM (TEE) (N/A) as a surgical intervention .  The patient's history has been reviewed, patient examined, no change in status, stable for surgery.  I have reviewed the patient's chart and labs.  Questions were answered to the patient's satisfaction.     Cabella Kimm [employer]

## 2013-09-27 NOTE — Progress Notes (Signed)
Stroke Team Progress Note  HISTORY  68 y.o. male who had just returned from lunch 09/23/2013 after a Prostate biopsy when his wife noted he had sudden onset of right arm and leg weakness, became mute and not following commands. EMS was called and found patient as noted prior and had a BP 218/110. Patient was brought to [facility] hospital where he was noted to have a left gaze preference, not crossing midline, mute and not following commands, right arm drift and right arm and leg weakness. Initial CT head was negative.  S/p TPA at 16:45. NIHSS 19 on admission.    SUBJECTIVE No acute events overnight. The patient is alert and conversant and states that he feels pretty good today.  OBJECTIVE Most recent Vital Signs: Filed Vitals:   09/26/13 2117 09/27/13 0156 09/27/13 0558 09/27/13 0803  BP: 188/76 153/84 173/92 159/91  Pulse: 79 78 82 81  Temp: 98.7 F (37.1 C) 98.9 F (37.2 C) 98.6 F (37 C) 98.8 F (37.1 C)  TempSrc: Oral Oral Oral Oral  Resp: 18 16 18 20   Height:      Weight:      SpO2: 98% 98% 100% 100%   CBG (last 3)   Recent Labs  09/26/13 1622 09/26/13 2112 09/27/13 0633  GLUCAP 268* 178* 184*    IV Fluid Intake:   . sodium chloride 75 mL/hr at 09/24/13 2223    MEDICATIONS  . antiseptic oral rinse  7 mL Mouth Rinse BID  . aspirin EC  81 mg Oral Daily  . atorvastatin  40 mg Oral q1800  . carvedilol  6.25 mg Oral BID WC  . clopidogrel  75 mg Oral Daily  . heparin subcutaneous  5,000 Units Subcutaneous 3 times per day  . insulin aspart  0-15 Units Subcutaneous TID WC  . insulin aspart  0-5 Units Subcutaneous QHS  . insulin glargine  35 Units Subcutaneous QHS  . losartan  100 mg Oral Daily  . metFORMIN  1,000 mg Oral BID WC  . pantoprazole  40 mg Oral Daily   PRN:  acetaminophen, acetaminophen, labetalol, senna-docusate  Diet:  NPO  Activity:  Bathroom privileges DVT Prophylaxis:  SCDs  CLINICALLY SIGNIFICANT STUDIES Basic Metabolic Panel:   Recent Labs Lab  09/23/13 1626 09/23/13 1640  NA 141 141  K 4.1 4.4  CL 103 102  CO2  --  26  GLUCOSE 189* 179*  BUN 26* 20  CREATININE 1.50* 1.43*  CALCIUM  --  9.0   Liver Function Tests: No results found for this basename: AST, ALT, ALKPHOS, BILITOT, PROT, ALBUMIN,  in the last 168 hours CBC:   Recent Labs Lab 09/23/13 1640  09/25/13 1516 09/26/13 0325  WBC 8.4  --  12.0* 12.4*  NEUTROABS 5.0  --   --   --   HGB 17.8*  < > 17.5* 16.9  HCT 53.9*  < > 52.7* 51.4  MCV 96.8  --  97.2 97.9  PLT 244  --  197 207  < > = values in this interval not displayed. Coagulation:   Recent Labs Lab 09/23/13 1640  LABPROT 14.0  INR 1.08   Cardiac Enzymes:   Recent Labs Lab 09/23/13 1640  TROPONINI <0.30   Urinalysis:   Recent Labs Lab 09/23/13 1723  COLORURINE AMBER*  LABSPEC 1.022  PHURINE 5.0  GLUCOSEU NEGATIVE  HGBUR LARGE*  BILIRUBINUR SMALL*  KETONESUR NEGATIVE  PROTEINUR 100*  UROBILINOGEN 1.0  NITRITE NEGATIVE  LEUKOCYTESUR NEGATIVE   Lipid Panel  Component Value Date/Time   CHOL 124 09/24/2013 0224   TRIG 104 09/24/2013 0224   HDL 33* 09/24/2013 0224   CHOLHDL 3.8 09/24/2013 0224   VLDL 21 09/24/2013 0224   LDLCALC 70 09/24/2013 0224   HgbA1C  Lab Results  Component Value Date   HGBA1C 8.5* 09/24/2013    Urine Drug Screen:   No results found for this basename: labopia,  cocainscrnur,  labbenz,  amphetmu,  thcu,  labbarb    Alcohol Level: No results found for this basename: ETH,  in the last 168 hours  Ct Angio Head and Neck W/cm &/or Wo Cm 09/23/2013    1. Mild to moderate generalized white matter disease is again seen.  2. No focal cortical infarct is evident.  3. Mild atherosclerotic calcifications within the cavernous carotid arteries without significant stenosis.  4. Atherosclerotic calcifications in the left carotid bifurcation without significant stenosis.  5. Tortuosity of cervical internal carotid arteries is worse on the left. There is no significant  stenosis.  6. Multilevel degenerative endplate changes of cervical spine as described.     Ct Head Wo Contrast 09/23/2013    No acute intracranial findings.  Minimal chronic ischemic microvascular disease.    Mr Brain Wo Contrast 09/24/2013    1. 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  2. Additional acute/subacute infarcts involving the posterior left frontal lobe and superior left temporal lobe may be slightly older.  3. Extensive white matter disease. This likely reflects the sequela of chronic microvascular ischemia.  4. Asymmetric attenuation of left MCA branch vessels corresponding with the areas of infarct.  5. Signal loss in the vertebrobasilar system is artifactual based on appearance and comparison with prior study.     Carotid Doppler - See CTA of head and neck.  2D Echocardiogram  ejection fraction 55%. No cardiac source of emboli identified.  Transesophageal Echocardiogram 09/27/13 Please see echo section for full report. Normal LV size with mild LV hypertrophy. EF 55-60%. Normal RV size and systolic function. Ascending aorta dilated to 4.4 cm. No LAA thrombus. Mild MR. There appeared to be a small PFO by bubble study. Grade II plaque in the descending thoracic aorta. Only potential source of embolus noted would be small PFO.   EKG  normal EKG, normal sinus rhythm, unchanged from previous tracings. For complete results please see formal report.   Telemetry - sinus rhythm so far  Therapy Recommendations - PT recommends no further therapy. Speech therapist recommends CIR vs outpt Speech. OT eval pending.  Physical Exam   Physical Exam  Constitutional: He appears well-developed and well-nourished.  HENT:  Head: Normocephalic.  Cardiovascular: Normal rate and regular rhythm.  Respiratory: Effort normal and breath sounds normal.  GI: Soft. Bowel sounds are normal. He exhibits no distension. There is no tenderness.    Mental Status:  Alert, awake follows commands. Expressive aphasia is much improved. However, still has difficulty with naming, especially with naming parts of objects. Repetition is mildly impaired. Cranial Nerves:  II: Discs flat bilaterally; Visual fields intact III,IV, VI: ptosis not present, eyes do not cross the midline to the right and have a forced gaze deviation to the left  V,VII: smile asymmetric asymmetric on the right, no wince to pain on the right  VIII: hearing normal bilaterally  IX,X: gag reflex present  XI: bilateral shoulder shrug  XII: midline tongue extension without atrophy or fasciculations Motor:  Right : Upper extremity  5/5 Left: Upper extremity 5-/5 with pronator drift. Lower extremity 5/5 Lower extremity 5/5  Tone and bulk:normal tone throughout; no atrophy noted  Sensory: no response to noxious stimuli on the right arm and leg  Deep Tendon Reflexes:  Right: Upper Extremity Left: Upper extremity  biceps (C-5 to C-6) 2/4 biceps (C-5 to C-6) 2/4  tricep (C7) 2/4 triceps (C7) 2/4  Brachioradialis (C6) 2/4 Brachioradialis (C6) 2/4  Lower Extremity Lower Extremity  quadriceps (L-2 to L-4) 1/4 quadriceps (L-2 to L-4) 1/4  Achilles (S1) 0/4 Achilles (S1) 0/4  Cerebellar: intact FTN  Gait: not tested  CV: pulses palpable throughout   ASSESSMENT [name] is a 68 y.o. male presenting to ED with sudden onset of mutism, right facial droop, right arm and leg paresis and left gaze preference. Left brain infarct suspected. S/p TPA with significant improvement. No fields cuts. NIHSS 5. 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 Additional acute/subacute infarcts involving the posterior left frontal lobe and superior left temporal lobe may be slightly older. Likely embolic. However, he also has multiple risk factors for stroke, including HTN, DM and HLD and overweight. Of note, he stated that he  supposed to be on plavix but he stopped for his prostate biopsy and later he felt sick. Now on aspirin 81 mg and Plavix 75 mg daily for secondary stroke prevention. He needs TEE and loop recorder for embolic stroke work up.   Cholesterol 124 LDL 70 - on Lipitor 40 mg daily prior to admission  Hypertension history with blood pressure 218/110 on admission, in last 24 hours 150s-180s/70s-90s, restart Coreg and losartan   Diabetes mellitus - hemoglobin A1c 8.5 (goal less than 6.5)  Prostate cancer - recent biopsy  Mild renal insufficiency  Status post TPA therapy  Patient reports small amount of bright red blood per rectum during last bowel movement.  overweight  Hospital day # 4  TREATMENT/PLAN Acute stroke  Carotid US <39% stenosis bilat ICAs  Echo: no definite source of emboli identified, EF normal  TEE today shows EF 55-60%, positive for PFO, Grade II plaque in descending thoracic aorta, no definite cardiac source of emboli identified  LE [place] ordered  Continue ASA 81 and plavix 75 for 3 months and then plavix monotherapy  Heparin subq for DVT prophylaxis  Physical therapist recommends no further therapy. Speech and Occupational therapists recommend ongoing therapy as outpatient, 24 hour assistance/supervision  CIR consult ordered due to Speech recs for Inpatient Rehab, per Rehab PA recs are d/c home.  HTN  BP still high, restarted Coreg and losartan  Monitor BP closely, BP 150s-180s/70s-90s in last 24 hours  Increase Coreg to 12.5 mg daily   BP goal 130/80  Ordered repeat BMP to see if creatinine recovered enough to start diuretic  HLD  Continue Lipitor at home dose  LDL 70, meeting goal  DM type II  restart home DM meds but need to be adjusted for better glucose control.   HgbA1C 8.5, goal <6.5.   Continue lantus Qhs   Continue metformin 1000mg  bid with meals  SSI.   Elevated Hb and Hct  likely due to hemoconcentration  Monitor to rule out  polycythemia vera  Repeated CBC today to continue to monitor  Denies smoking  SIGNED Delbert Phenix, MSN, Trey Paula, Roney Jaffe Stroke Team [phone]  09/27/2013, 8:12 AM  I, the attending vascular neurologist, have personally obtained a history,  examined the patient, evaluated laboratory data, individually viewed imaging studies, and formulated the assessment and plan of care.  I have made any additions or clarifications directly to the above note and agree with the findings and plan as currently documented.   Rosalin Hawking, MD PhD 09/27/2013 3:22 PM    To contact Stroke Continuity provider, please refer to [email or web address] After hours, contact General Neurology

## 2013-09-27 NOTE — Progress Notes (Addendum)
Pt back from TEE. Francis Gaines Nalu Troublefield RN.

## 2013-09-27 NOTE — Progress Notes (Signed)
Pt transported off to Endo for TEE. Francis Gaines Rayjon Wery RN.

## 2013-09-27 NOTE — CV Procedure (Signed)
Procedure: TEE  Indication: CVA  Sedation: Versed 2 mg IV, Fentanyl 25 mcg IV  Findings: Please see echo section for full report.  Normal LV size with mild LV hypertrophy. EF 55-60%.  Normal RV size and systolic function.  Ascending aorta dilated to 4.4 cm.  No LAA thrombus.  Mild MR.  There appeared to be a small PFO by bubble study. Grade II plaque in the descending thoracic aorta.  Only potential source of embolus noted would be small PFO.   No complications.   Loralie Champagne 09/27/2013 1:21 PM

## 2013-09-27 NOTE — Progress Notes (Signed)
  Echocardiogram Echocardiogram Transesophageal has been performed.  James Mcclain FRANCES 09/27/2013, 1:30 PM

## 2013-09-27 NOTE — Consult Note (Signed)
ELECTROPHYSIOLOGY CONSULT NOTE  Patient ID: James Mcclain MRN: [id number], DOB/AGE: 67-Dec-1947   Admit date: 09/23/2013 Date of Consult: 09/27/2013  Primary Physician: Odette Fraction, MD Primary Cardiologist: Aundra Dubin Reason for Consultation: Cryptogenic stroke; recommendations regarding Implantable Loop Recorder  History of Present Illness James Mcclain was admitted on 09/23/2013 with right sided weakness and aphasia.  Imaging demonstrated acute corticol infarct involving left parietal lobe with focal petechial hemorrhage.  He has undergone workup for stroke including echocardiogram and carotid dopplers.  The patient has been monitored on telemetry which has demonstrated sinus rhythm with runs of MAT.  Inpatient stroke work-up is to be completed with a TEE.   Past medical history is notable for diabetes, HTN, CKD, CAD (s/p PCI to LAD 07-2012).   Echocardiogram this admission demonstrated EF 97%, grade 1 diastolic dysfunction, moderately dilated aortic root, LA 40.  Lab work is reviewed.   Prior to admission, the patient denies chest pain, shortness of breath, dizziness, palpitations, or syncope.     EP has been asked to evaluate for placement of an implantable loop recorder to monitor for atrial fibrillation. On telemetry, he has had MAT but no clear cut atrial fib.  ROS is negative except as outlined above.    Past Medical History  Diagnosis Date  . Arthritis   . Cholesterol serum elevated   . Hypertension   . Elevated PSA   . Coronary artery disease 08/05/2012    s/p Xience DES to the LAD  . Heart murmur   . Shortness of breath   . Prostate cancer 07/15/12  . Diabetes mellitus     x 8 years     Surgical History:  Past Surgical History  Procedure Laterality Date  . Kidney stones      2006  . Tonsillectomy      as adult  . Total knee arthroplasty  01/24/2011    Procedure: TOTAL KNEE ARTHROPLASTY;  Surgeon: Alta Corning;  Location: [place];  Service: Orthopedics;   Laterality: Left;  COMPUTER ASSISTED LEFT  TOTAL KNEE REPLACEMENT. Anesthesia a combination of regional and general.  . Coronary angioplasty with stent placement  08/05/2012    LAD, 1 stent  . Prostate biopsy  07/15/2012     Prescriptions prior to admission  Medication Sig Dispense Refill  . allopurinol (ZYLOPRIM) 100 MG tablet Take 200 mg by mouth daily.      [name] amLODipine (NORVASC) 10 MG tablet Take 10 mg by mouth daily.      [name] aspirin EC 81 MG tablet Take 1 tablet (81 mg total) by mouth daily.      [name] atorvastatin (LIPITOR) 40 MG tablet Take 40 mg by mouth daily.      . Canagliflozin (INVOKANA) 300 MG TABS Take 1 tablet (300 mg total) by mouth daily.  90 tablet  3  . carvedilol (COREG) 12.5 MG tablet Take 1 tablet (12.5 mg total) by mouth 2 (two) times daily.  60 tablet  6  . carvedilol (COREG) 6.25 MG tablet Take 1 tablet (6.25 mg total) by mouth 2 (two) times daily with a meal.  60 tablet  6  . Cholecalciferol (VITAMIN D3) 5000 UNITS CAPS Take 10,000 Units by mouth daily.      . clopidogrel (PLAVIX) 75 MG tablet Take 1 tablet (75 mg total) by mouth daily with breakfast.  30 tablet  2  . insulin glargine (LANTUS) 100 units/mL SOLN Inject 35 Units into the skin at bedtime.       [name]  losartan-hydrochlorothiazide (HYZAAR) 100-25 MG per tablet Take 1 tablet by mouth daily.      . metFORMIN (GLUCOPHAGE) 1000 MG tablet Take 1 tablet (1,000 mg total) by mouth 2 (two) times daily with a meal.  180 tablet  3  . zinc gluconate 50 MG tablet Take 100 mg by mouth daily.         Inpatient Medications:  . antiseptic oral rinse  7 mL Mouth Rinse BID  . aspirin EC  81 mg Oral Daily  . atorvastatin  40 mg Oral q1800  . carvedilol  6.25 mg Oral BID WC  . clopidogrel  75 mg Oral Daily  . heparin subcutaneous  5,000 Units Subcutaneous 3 times per day  . insulin aspart  0-15 Units Subcutaneous TID WC  . insulin aspart  0-5 Units Subcutaneous QHS  . insulin glargine  35 Units Subcutaneous QHS  . losartan   100 mg Oral Daily  . metFORMIN  1,000 mg Oral BID WC  . pantoprazole  40 mg Oral Daily    Allergies:  Allergies  Allergen Reactions  . Fish Allergy Swelling    History   Social History  . Marital Status: Married    Spouse Name: N/A    Number of Children: N/A  . Years of Education: N/A   Occupational History  . Not on file.   Social History Main Topics  . Smoking status: Never Smoker   . Smokeless tobacco: Never Used  . Alcohol Use: No  . Drug Use: No  . Sexual Activity: Not Currently   Other Topics Concern  . Not on file   Social History Narrative  . No narrative on file     Family History  Problem Relation Age of Onset  . Leukemia Mother   . Heart Problems Father   . Heart Problems      family history  . Diabetes Sister   . Heart Problems Brother     BP 173/92  Pulse 82  Temp(Src) 98.6 F (37 C) (Oral)  Resp 18  Ht 5\' 10"  (1.778 m)  Wt 268 lb 11.9 oz (121.9 kg)  BMI 38.56 kg/m2  SpO2 100%  Physical Exam:  Well appearing 68 yo man, NAD HEENT: Unremarkable,[place], AT Neck:  6 JVD, no thyromegally Back:  No CVA tenderness Lungs:  Clear with no wheezes, rales, or rhonchi HEART:  Regular rate rhythm, no murmurs, no rubs, no clicks Abd:  soft, positive bowel sounds, no organomegally, no rebound, no guarding Ext:  2 plus pulses, no edema, no cyanosis, no clubbing Skin:  No rashes no nodules Neuro:  CN II through XII intact, motor grossly intact except for some difficulty with word finding.   Labs:   Lab Results  Component Value Date   WBC 12.4* 09/26/2013   HGB 16.9 09/26/2013   HCT 51.4 09/26/2013   MCV 97.9 09/26/2013   PLT 207 09/26/2013    Recent Labs Lab 09/23/13 1640  NA 141  K 4.4  CL 102  CO2 26  BUN 20  CREATININE 1.43*  CALCIUM 9.0  GLUCOSE 179*     Radiology/Studies: Ct Angio Head W/cm &/or Wo Cm 09/23/2013   CLINICAL DATA:  Stroke. Sudden onset of right arm and leg weakness after prostate biopsy N launch today. The patient became and  phasic. He was hypertensive. Left gaze preference.  EXAM: CT ANGIOGRAPHY HEAD AND NECK  TECHNIQUE: Multidetector CT imaging of the head and neck was performed using the standard protocol during bolus  administration of intravenous contrast. Multiplanar CT image reconstructions and MIPs were obtained to evaluate the vascular anatomy. Carotid stenosis measurements (when applicable) are obtained utilizing NASCET criteria, using the distal internal carotid diameter as the denominator.  CONTRAST:  23mL OMNIPAQUE IOHEXOL 350 MG/ML SOLN  COMPARISON:  CT head without contrast 09/23/2013.  FINDINGS: CTA HEAD FINDINGS  Source images demonstrate moderate white matter disease without focal cortical infarct. No hemorrhage or mass lesion is present.  Atherosclerotic calcifications are present within the cavernous internal carotid arteries bilaterally without significant stenosis. The A1 and M1 segments are normal. Left A1 segment is dominant. The anterior communicating artery is patent. The MCA bifurcations are within normal limits. ACA and MCA branch vessels are unremarkable.  The vertebral arteries are codominant. The vertebrobasilar junction is within normal limits. The right PICA and PICA origins are normal. The left AICA is dominant. The basilar artery is within normal limits. Both posterior cerebral arteries originate from the basilar tip. The PCA branch vessels are within normal limits.  Review of the MIP images confirms the above findings.  CTA NECK FINDINGS  There is a common origin of the left common carotid artery in the innominate artery. The vertebral arteries both originate from the subclavian arteries. The vertebral arteries are codominant. There is no significant focal stenosis of the vertebral arteries in the neck.  The right common carotid artery is within normal limits. The bifurcation is unremarkable. There is mild tortuosity of the cervical right internal carotid artery without significant stenosis.  The  proximal left common carotid artery is mildly tortuous without significant stenosis. Atherosclerotic calcifications are present at carotid bifurcation. There is no significant associated stenosis. Moderate tortuosity is present in the mid cervical ICA without significant stenosis.  The soft tissues of the neck are otherwise unremarkable. The lung apices are clear. Bone windows demonstrate multilevel degenerative endplate changes, most evident from C4-5 through C6-7.  Review of the MIP images confirms the above findings.  IMPRESSION: 1. Mild to moderate generalized white matter disease is again seen. 2. No focal cortical infarct is evident. 3. Mild atherosclerotic calcifications within the cavernous carotid arteries without significant stenosis. 4. Atherosclerotic calcifications in the left carotid bifurcation without significant stenosis. 5. Tortuosity of cervical internal carotid arteries is worse on the left. There is no significant stenosis. 6. Multilevel degenerative endplate changes of cervical spine as described.   Electronically Signed   By: Lawrence Santiago M.D.   On: 09/23/2013 17:36   Mr Brain Wo Contrast 09/24/2013   CLINICAL DATA:  Improving speech deficit.  Status post tPA.  EXAM: MRI HEAD WITHOUT CONTRAST  MRA HEAD WITHOUT CONTRAST  TECHNIQUE: Multiplanar, multiecho pulse sequences of the brain and surrounding structures were obtained without intravenous contrast. Angiographic images of the head were obtained using MRA technique without contrast.  COMPARISON:  CTA head and neck 09/23/2013  FINDINGS: MRI HEAD FINDINGS  The diffusion-weighted images demonstrate an acute infarct involving the left parietal lobe, posterior left frontal lobe, and superior left temporal lobe. There is a single punctate area of susceptibility within the left parietal infarct likely representing minimal cortical hemorrhage. No other significant hemorrhage is present. Extensive T2 changes correspond to the areas of acute  infarction. The left parietal areas may be more acute been the frontal and temporal infarcts.  Extensive periventricular and subcortical white matter changes present as well. No other acute hemorrhage or mass lesion is present.  Flow is present in the major intracranial arteries. Globes and orbits are intact. Mild  mucosal thickening is present in the maxillary sinuses bilaterally. There is some fluid in the mastoid air cells bilaterally. No obstructing nasopharyngeal lesion is evident.  MRA HEAD FINDINGS  The internal carotid arteries are within normal limits bilaterally from the high cervical segments through the ICA termini. The A1 and M1 segments are normal. The anterior communicating artery is patent. The MCA bifurcations are intact. There is asymmetric attenuation of superior and anterior left MCA branch vessels compared to the right. No significant proximal stenosis or occlusion is present.  The vertebral arteries are codominant. The PICA origins are not visualized. Minimal signal loss in the mid basilar artery is artifactual based on the previous CT. The posterior cerebral arteries originate from the basilar tip. Signal loss in the PCA branch vessels is likely artifactual as well.  IMPRESSION: 1. 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 2. Additional acute/subacute infarcts involving the posterior left frontal lobe and superior left temporal lobe may be slightly older. 3. Extensive white matter disease. This likely reflects the sequela of chronic microvascular ischemia. 4. Asymmetric attenuation of left MCA branch vessels corresponding with the areas of infarct. 5. Signal loss in the vertebrobasilar system is artifactual based on appearance and comparison with prior study.   Electronically Signed   By: Lawrence Santiago M.D.   On: 09/24/2013 18:29   12-lead ECG sinus brady, rate 54, normal intervals  Telemetry sinus rhythm with runs of  MAT   Assessment and Plan 1. Unexplained stroke 2. HTN 3. MAT 4. Chronic renal insufficiency Rec: The patient is pending TEE. If negative, I would suggest he undergo a 4 week cardiac monitor as he clearly has MAT and will very likely have atrial fib on his 4 week monitor which would guide therapy appropriately. After wearing a cardiac monitor I would like to see him back to discuss insertion of an ILR, assuming the monitor were negative.  Mikle Bosworth.D.

## 2013-09-28 ENCOUNTER — Encounter (HOSPITAL_COMMUNITY): Payer: Self-pay | Admitting: Cardiology

## 2013-09-28 DIAGNOSIS — I6350 Cerebral infarction due to unspecified occlusion or stenosis of unspecified cerebral artery: Secondary | ICD-10-CM

## 2013-09-28 LAB — GLUCOSE, CAPILLARY
Glucose-Capillary: 163 mg/dL — ABNORMAL HIGH (ref 70–99)
Glucose-Capillary: 139 mg/dL — ABNORMAL HIGH (ref 70–99)

## 2013-09-28 NOTE — Discharge Summary (Signed)
Stroke Discharge Summary  Patient ID: James Mcclain   MRN: [id number]      DOB: [birth year]  Date of Admission: 09/23/2013 Date of Discharge: 09/28/2013  Attending Physician:  Rosalin Hawking, MD, Stroke MD  Consulting Physician(s):     Cristopher Peru, MD (electrophysiology) Patient's PCP:  Odette Fraction, MD  Discharge Diagnoses:  Principal Problem:   CVA (cerebral infarction) - acute cortical infarct involving the left parietal lobe s/p IV tPA with focal asymptomatic petechial hemorrhage Malignant HTN Hyperlipidemia DM type II  Elevated Hb and Hct  Overweight, Body mass index is 38.56 kg/(m^2).  Prostate cancer  Mild renal insufficiency, resolved   Past Medical History  Diagnosis Date  . Arthritis   . Cholesterol serum elevated   . Hypertension   . Elevated PSA   . Coronary artery disease 08/05/2012    s/p Xience DES to the LAD  . Heart murmur   . Shortness of breath   . Prostate cancer 07/15/12  . Diabetes mellitus     x 8 years   Past Surgical History  Procedure Laterality Date  . Kidney stones      2006  . Tonsillectomy      as adult  . Total knee arthroplasty  01/24/2011    Procedure: TOTAL KNEE ARTHROPLASTY;  Surgeon: Alta Corning;  Location: [employer];  Service: Orthopedics;  Laterality: Left;  COMPUTER ASSISTED LEFT  TOTAL KNEE REPLACEMENT. Anesthesia a combination of regional and general.  . Coronary angioplasty with stent placement  08/05/2012    LAD, 1 stent  . Prostate biopsy  07/15/2012  . Tee without cardioversion N/A 09/27/2013    Procedure: TRANSESOPHAGEAL ECHOCARDIOGRAM (TEE);  Surgeon: Larey Dresser, MD;  Location: [facility] ENDOSCOPY;  Service: Cardiovascular;  Laterality: N/A;      Medication List         allopurinol 100 MG tablet  Commonly known as:  ZYLOPRIM  Take 200 mg by mouth daily.     amLODipine 10 MG tablet  Commonly known as:  NORVASC  Take 10 mg by mouth daily.     aspirin EC 81 MG tablet  Take 1 tablet (81 mg total) by mouth daily.      atorvastatin 40 MG tablet  Commonly known as:  LIPITOR  Take 40 mg by mouth daily.     Canagliflozin 300 MG Tabs  Commonly known as:  INVOKANA  Take 1 tablet (300 mg total) by mouth daily.     carvedilol 6.25 MG tablet  Commonly known as:  COREG  Take 1 tablet (6.25 mg total) by mouth 2 (two) times daily with a meal.     carvedilol 12.5 MG tablet  Commonly known as:  COREG  Take 1 tablet (12.5 mg total) by mouth 2 (two) times daily.     clopidogrel 75 MG tablet  Commonly known as:  PLAVIX  Take 1 tablet (75 mg total) by mouth daily with breakfast.     insulin glargine 100 unit/mL Sopn  Commonly known as:  LANTUS  Inject 35 Units into the skin at bedtime.     losartan-hydrochlorothiazide 100-25 MG per tablet  Commonly known as:  HYZAAR  Take 1 tablet by mouth daily.     metFORMIN 1000 MG tablet  Commonly known as:  GLUCOPHAGE  Take 1 tablet (1,000 mg total) by mouth 2 (two) times daily with a meal.     Vitamin D3 5000 UNITS Caps  Take 10,000 Units by mouth daily.  zinc gluconate 50 MG tablet  Take 100 mg by mouth daily.        LABORATORY STUDIES CBC    Component Value Date/Time   WBC 9.7 09/27/2013 1528   RBC 5.52 09/27/2013 1528   HGB 17.6* 09/27/2013 1528   HCT 54.0* 09/27/2013 1528   PLT 200 09/27/2013 1528   MCV 97.8 09/27/2013 1528   MCH 31.9 09/27/2013 1528   MCHC 32.6 09/27/2013 1528   RDW 14.2 09/27/2013 1528   LYMPHSABS 2.2 09/23/2013 1640   MONOABS 0.8 09/23/2013 1640   EOSABS 0.3 09/23/2013 1640   BASOSABS 0.0 09/23/2013 1640   CMP    Component Value Date/Time   NA 143 09/27/2013 1528   K 3.7 09/27/2013 1528   CL 103 09/27/2013 1528   CO2 27 09/27/2013 1528   GLUCOSE 159* 09/27/2013 1528   BUN 13 09/27/2013 1528   CREATININE 1.06 09/27/2013 1528   CREATININE 1.42* 04/28/2013 1055   CALCIUM 8.6 09/27/2013 1528   PROT 7.1 04/28/2013 1055   ALBUMIN 3.8 04/28/2013 1055   AST 15 04/28/2013 1055   ALT 14 04/28/2013 1055   ALKPHOS 64 04/28/2013 1055   BILITOT 0.7 04/28/2013 1055    GFRNONAA 71* 09/27/2013 1528   GFRNONAA 51* 04/28/2013 1055   GFRAA 82* 09/27/2013 1528   GFRAA 59* 04/28/2013 1055   COAGS Lab Results  Component Value Date   INR 1.08 09/23/2013   INR 1.1* 07/29/2012   INR 1.73* 01/27/2011   Lipid Panel    Component Value Date/Time   CHOL 124 09/24/2013 0224   TRIG 104 09/24/2013 0224   HDL 33* 09/24/2013 0224   CHOLHDL 3.8 09/24/2013 0224   VLDL 21 09/24/2013 0224   LDLCALC 70 09/24/2013 0224   HgbA1C  Lab Results  Component Value Date   HGBA1C 8.5* 09/24/2013   Cardiac Panel (last 3 results) No results found for this basename: CKTOTAL, CKMB, TROPONINI, RELINDX,  in the last 72 hours Urinalysis    Component Value Date/Time   COLORURINE AMBER* 09/23/2013 1723   APPEARANCEUR CLOUDY* 09/23/2013 1723   LABSPEC 1.022 09/23/2013 1723   PHURINE 5.0 09/23/2013 1723   GLUCOSEU NEGATIVE 09/23/2013 1723   HGBUR LARGE* 09/23/2013 1723   BILIRUBINUR SMALL* 09/23/2013 1723   KETONESUR NEGATIVE 09/23/2013 1723   PROTEINUR 100* 09/23/2013 1723   UROBILINOGEN 1.0 09/23/2013 1723   NITRITE NEGATIVE 09/23/2013 1723   LEUKOCYTESUR NEGATIVE 09/23/2013 1723   Urine Drug Screen  No results found for this basename: labopia, cocainscrnur, labbenz, amphetmu, thcu, labbarb    Alcohol Level No results found for this basename: eth    SIGNIFICANT DIAGNOSTIC STUDIES Ct Angio Head and Neck W/cm &/or Wo Cm 09/23/2013  1. Mild to moderate generalized white matter disease is again seen.  2. No focal cortical infarct is evident.  3. Mild atherosclerotic calcifications within the cavernous carotid arteries without significant stenosis.  4. Atherosclerotic calcifications in the left carotid bifurcation without significant stenosis.  5. Tortuosity of cervical internal carotid arteries is worse on the left. There is no significant stenosis.  6. Multilevel degenerative endplate changes of cervical spine as described.  Ct Head Wo Contrast 09/23/2013  No acute intracranial findings. Minimal chronic  ischemic microvascular disease.  Mr Brain Wo Contrast 09/24/2013  1. 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  2. Additional acute/subacute infarcts involving the posterior left frontal lobe and superior left temporal lobe may be slightly older.  3. Extensive white matter disease. This likely reflects the sequela of chronic microvascular ischemia.  4. Asymmetric attenuation of left MCA branch vessels corresponding with the areas of infarct.  5. Signal loss in the vertebrobasilar system is artifactual based on appearance and comparison with prior study.  Carotid Doppler - See CTA of head and neck.  2D Echocardiogram ejection fraction 55%. No cardiac source of emboli identified.  Transesophageal Echocardiogram 09/27/13 Please see echo section for full report. Normal LV size with mild LV hypertrophy. EF 55-60%. Normal RV size and systolic function. Ascending aorta dilated to 4.4 cm. No LAA thrombus. Mild MR. There appeared to be a small PFO by bubble study. Grade II plaque in the descending thoracic aorta. Only potential source of embolus noted would be small PFO.  LE venous dopplers negative EKG normal EKG, normal sinus rhythm, unchanged from previous tracings. For complete results please see formal report.      History of Present Illness  [name] is a 68 y.o. male who had just returned from lunch 09/23/2013 after a Prostate biopsy when his wife noted he had sudden onset of right arm and leg weakness, became mute and not following commands. EMS was called and found patient with a BP 218/110. Patient was brought to [facility] hospital where he was noted to have a left gaze preference, not crossing midline, mute and not following commands, right arm drift and right arm and leg weakness. Initial CT head was negative. NIHSS 19 on admission.  He received IV TPA and was admitted for further evaluation.  Hospital Course  Patient S/p TPA with  significant improvement with improvement in fields cuts and NIHSS 5. MRI reveals an acute cortical infarct involving the left parietal lobe with a 7 mm area of focal asymptomatic petechial hemorrhage. Additional acute/subacute infarcts involving the posterior left frontal lobe and superior left temporal lobe may be slightly older. Infarcts ikely embolic. However, he also has multiple risk factors for stroke, including HTN, DM and HLD and overweight. Of note, he stated that he supposed to be on plavix but he stopped for his prostate biopsy. He was changed to aspirin 81 mg and Plavix 75 mg daily for secondary stroke prevention x 3 months then one alone.   Acute stroke  Carotid US <39% stenosis bilat ICAs  Echo: no definite source of emboli identified, EF normal  TEE shows EF 55-60%, positive for PFO, Grade II plaque in descending thoracic aorta, no definite cardiac source of emboli identified  Continue ASA 81 and plavix 75 for 3 months and then plavix monotherapy  Speech and Occupational therapy as outpatient, 24 hour assistance/supervision   HTN Blood pressure 218/110 on admission IncreasedCoreg to 12.5 mg daily and restarted losartan BP goal 130/80   HLD  Continue Lipitor at home dose, 40 mg daily  LDL 70, meeting goal  DM type II  restarted home DM meds  - lantus Qhs & metformin 1000mg  bid with meals  Recommend adjustment for better glucose control.  HgbA1C 8.5, goal <6.5.   Elevated Hb and Hct  likely due to hemoconcentration  Recommend f/u to rule out polycythemia vera  Denies smoking  Overweight Body mass index is 38.56 kg/(m^2).   Prostate cancer  - recent biopsy  - follow up with PCP  Mild renal insufficiency, resolved  Discharge Exam  Blood pressure 165/84, pulse 62, temperature 98.1 F (36.7 C), temperature source Oral, resp. rate 18, height 5\' 10"  (1.778 m), weight 121.9 kg (268 lb 11.9  oz), SpO2 100.00%.  Constitutional: He appears well-developed and well-nourished.   HENT:  Head: Normocephalic.  Cardiovascular: Normal rate and regular rhythm.  Respiratory: Effort normal and breath sounds normal.  GI: Soft. Bowel sounds are normal. He exhibits no distension. There is no tenderness.  Mental Status:  Alert, awake follows commands. Expressive aphasia is much improved. However, still has difficulty with naming, especially with naming parts of objects. Repetition is mildly impaired.  Cranial Nerves:  II: Discs flat bilaterally; Visual fields intact  III,IV, VI: ptosis not present, eyes do not cross the midline to the right and have a forced gaze deviation to the left  V,VII: smile asymmetric asymmetric on the right, no wince to pain on the right  VIII: hearing normal bilaterally  IX,X: gag reflex present  XI: bilateral shoulder shrug  XII: midline tongue extension without atrophy or fasciculations  Motor:  Right : Upper extremity 5/5 Left: Upper extremity 5-/5 with pronator drift.  Lower extremity 5/5 Lower extremity 5/5  Tone and bulk:normal tone throughout; no atrophy noted  Sensory: no response to noxious stimuli on the right arm and leg  Deep Tendon Reflexes:  Right: Upper Extremity Left: Upper extremity  biceps (C-5 to C-6) 2/4 biceps (C-5 to C-6) 2/4  tricep (C7) 2/4 triceps (C7) 2/4  Brachioradialis (C6) 2/4 Brachioradialis (C6) 2/4  Lower Extremity Lower Extremity  quadriceps (L-2 to L-4) 1/4 quadriceps (L-2 to L-4) 1/4  Achilles (S1) 0/4 Achilles (S1) 0/4  Cerebellar: intact FTN  Gait: not tested  CV: pulses palpable throughout   Discharge Diet   Carb Control thin liquids  Discharge Plan    Disposition:  Home with family   aspirin 81 mg orally every day and clopidogrel 75 mg orally every day for secondary stroke prevention.  Ongoing risk factor control by Primary Care Physician.  Follow-up [name],[name], MD in 2 weeks.  Follow-up with Dr. Antony Contras, Stroke Clinic in 2 months.  35 minutes were spent preparing  discharge.  Burnetta Sabin, MSN, RN, ANVP-BC, ANP-BC, GNP-BC [facility] Pager: [phone] 09/29/2013 1:43 PM  Signed I, the attending vascular neurologist, have personally obtained a history, examined the patient, evaluated laboratory data, individually viewed imaging studies, and formulated the assessment and plan of care.  I have made any additions or clarifications directly to the above note and agree with the findings and plan as currently documented.   Rosalin Hawking, MD PhD Stroke Neurology 09/29/2013 2:40 PM

## 2013-09-28 NOTE — Progress Notes (Signed)
VASCULAR LAB PRELIMINARY  PRELIMINARY  PRELIMINARY  PRELIMINARY  Bilateral lower extremity venous duplex  completed.    Preliminary report:  Bilateral:  No evidence of DVT, superficial thrombosis, or Baker's Cyst.    Dashiel Bergquist, RVT 09/28/2013, 11:35 AM

## 2013-09-28 NOTE — Progress Notes (Signed)
Pt A&O x4; pt discharge education and instructions completed with pt and spouse at bedside. All voices understanding and denies any questions. Pt IV and telemetry removed; pt provided handout education on stroke as well had provided EMMI form and consent signed. Pt discharge home and family to transport pt off to disposition. Pt ambulated off unit with belongings and family at side. Francis Gaines Tanza Pellot RN.

## 2013-09-28 NOTE — Progress Notes (Signed)
Pt discharged but mistakenly handed pt the signed AVS paper. Pt left unit before new signature could be attain. P.Amo Alannah Averhart RN.

## 2013-09-28 NOTE — Progress Notes (Signed)
Speech Language Pathology Treatment: Cognitive-Linquistic  Patient Details Name: TOWNES FUHS MRN: [id number] DOB: 1945-03-11 Today's Date: 09/28/2013 Time: 8315-1761 SLP Time Calculation (min): 19 min  Assessment / Plan / Recommendation Clinical Impression  Patient continues to make gains with functional communication, due in part to increased self-monitoring and self-regulation. During spontaneous conversation he is able to identify linguistic errors with Mod I, with Mod cues for correction. SLP provided written list of word-finding strategies, which pt then used with Mod-Max multimodal cues to complete a brief divergent naming task. Education was given to patient and wife regarding aphasia and recommendation for OP SLP services.    HPI HPI: 68 y.o. male admitted 7/31 for R arm/ leg weakness/ mute/ not following commands. Head CT showed no acute findings, awaiting MRI. Bedside swallow eval ordered as pt is reportedly not responding to speech.   Pertinent Vitals n/a  SLP Plan  Continue with current plan of care    Recommendations                Follow up Recommendations: Outpatient SLP;24 hour supervision/assistance Plan: Continue with current plan of care    GO      Germain Osgood, M.A. CCC-SLP [phone]  Germain Osgood 09/28/2013, 3:30 PM

## 2013-10-04 ENCOUNTER — Encounter: Payer: Self-pay | Admitting: Family Medicine

## 2013-10-04 ENCOUNTER — Ambulatory Visit (INDEPENDENT_AMBULATORY_CARE_PROVIDER_SITE_OTHER): Payer: Medicare Other | Admitting: Family Medicine

## 2013-10-04 VITALS — BP 130/80 | HR 62 | Temp 98.4°F | Resp 18 | Wt 270.0 lb

## 2013-10-04 DIAGNOSIS — Z09 Encounter for follow-up examination after completed treatment for conditions other than malignant neoplasm: Secondary | ICD-10-CM

## 2013-10-04 MED ORDER — CANAGLIFLOZIN 300 MG PO TABS: 300.0000 mg | ORAL_TABLET | Freq: Every day | ORAL | Status: DC

## 2013-10-04 NOTE — Progress Notes (Signed)
Subjective:    Patient ID: James Mcclain, male    DOB: January 22, 1946, 68 y.o.   MRN: [id number]  HPI Patient is here today for hospital discharge followup. He was admitted to the hospital with a left parietal stroke. I have copied relevant portions of the discharge summary and included them below for my reference: Date of Admission: 09/23/2013  Date of Discharge: 09/28/2013  Attending Physician: Rosalin Hawking, MD, Stroke MD  Consulting Physician(s): Cristopher Peru, MD (electrophysiology)  Patient's PCP: Odette Fraction, MD  Discharge Diagnoses:  Principal Problem:  CVA (cerebral infarction) - acute cortical infarct involving the left parietal lobe s/p IV tPA with focal asymptomatic petechial hemorrhage  Malignant HTN  Hyperlipidemia  DM type II  Elevated Hb and Hct  Overweight, Body mass index is 38.56 kg/(m^2).  Prostate cancer  Mild renal insufficiency, resolved  Past Medical History   Diagnosis  Date   .  Arthritis    .  Cholesterol serum elevated    .  Hypertension    .  Elevated PSA    .  Coronary artery disease  08/05/2012     s/p Xience DES to the LAD   .  Heart murmur    .  Shortness of breath    .  Prostate cancer  07/15/12   .  Diabetes mellitus      x 8 years    Past Surgical History   Procedure  Laterality  Date   .  Kidney stones       2006   .  Tonsillectomy       as adult   .  Total knee arthroplasty   01/24/2011     Procedure: TOTAL KNEE ARTHROPLASTY; Surgeon: Alta Corning; Location: [place]; Service: Orthopedics; Laterality: Left; COMPUTER ASSISTED LEFT TOTAL KNEE REPLACEMENT. Anesthesia a combination of regional and general.   .  Coronary angioplasty with stent placement   08/05/2012     LAD, 1 stent   .  Prostate biopsy   07/15/2012   .  Tee without cardioversion  N/A  09/27/2013     Procedure: TRANSESOPHAGEAL ECHOCARDIOGRAM (TEE); Surgeon: Larey Dresser, MD; Location: [facility] ENDOSCOPY; Service: Cardiovascular; Laterality: N/A;      Medication List         allopurinol 100 MG tablet    Commonly known as: ZYLOPRIM    Take 200 mg by mouth daily.    amLODipine 10 MG tablet    Commonly known as: NORVASC    Take 10 mg by mouth daily.    aspirin EC 81 MG tablet    Take 1 tablet (81 mg total) by mouth daily.    atorvastatin 40 MG tablet    Commonly known as: LIPITOR    Take 40 mg by mouth daily.    Canagliflozin 300 MG Tabs    Commonly known as: INVOKANA    Take 1 tablet (300 mg total) by mouth daily.    carvedilol 6.25 MG tablet    Commonly known as: COREG    Take 1 tablet (6.25 mg total) by mouth 2 (two) times daily with a meal.    carvedilol 12.5 MG tablet    Commonly known as: COREG    Take 1 tablet (12.5 mg total) by mouth 2 (two) times daily.    clopidogrel 75 MG tablet    Commonly known as: PLAVIX    Take 1 tablet (75 mg total) by mouth daily with breakfast.    insulin glargine 100  unit/mL Sopn    Commonly known as: LANTUS    Inject 35 Units into the skin at bedtime.    losartan-hydrochlorothiazide 100-25 MG per tablet    Commonly known as: HYZAAR    Take 1 tablet by mouth daily.    metFORMIN 1000 MG tablet    Commonly known as: GLUCOPHAGE    Take 1 tablet (1,000 mg total) by mouth 2 (two) times daily with a meal.    Vitamin D3 5000 UNITS Caps    Take 10,000 Units by mouth daily.    zinc gluconate 50 MG tablet    Take 100 mg by mouth daily.     LABORATORY STUDIES  CBC    Component  Value  Date/Time    WBC  9.7  09/27/2013 1528    RBC  5.52  09/27/2013 1528    HGB  17.6*  09/27/2013 1528    HCT  54.0*  09/27/2013 1528    PLT  200  09/27/2013 1528    MCV  97.8  09/27/2013 1528    MCH  31.9  09/27/2013 1528    MCHC  32.6  09/27/2013 1528    RDW  14.2  09/27/2013 1528    LYMPHSABS  2.2  09/23/2013 1640    MONOABS  0.8  09/23/2013 1640    EOSABS  0.3  09/23/2013 1640    BASOSABS  0.0  09/23/2013 1640   CMP    Component  Value  Date/Time    NA  143  09/27/2013 1528    K  3.7  09/27/2013 1528    CL  103  09/27/2013 1528    CO2  27  09/27/2013  1528    GLUCOSE  159*  09/27/2013 1528    BUN  13  09/27/2013 1528    CREATININE  1.06  09/27/2013 1528    CREATININE  1.42*  04/28/2013 1055    CALCIUM  8.6  09/27/2013 1528    PROT  7.1  04/28/2013 1055    ALBUMIN  3.8  04/28/2013 1055    AST  15  04/28/2013 1055    ALT  14  04/28/2013 1055    ALKPHOS  64  04/28/2013 1055    BILITOT  0.7  04/28/2013 1055    GFRNONAA  71*  09/27/2013 1528    GFRNONAA  51*  04/28/2013 1055    GFRAA  82*  09/27/2013 1528    GFRAA  59*  04/28/2013 1055   COAGS  Lab Results   Component  Value  Date    INR  1.08  09/23/2013    INR  1.1*  07/29/2012    INR  1.73*  01/27/2011   Lipid Panel    Component  Value  Date/Time    CHOL  124  09/24/2013 0224    TRIG  104  09/24/2013 0224    HDL  33*  09/24/2013 0224    CHOLHDL  3.8  09/24/2013 0224    VLDL  21  09/24/2013 0224    LDLCALC  70  09/24/2013 0224   HgbA1C  Lab Results   Component  Value  Date    HGBA1C  8.5*  09/24/2013   Cardiac Panel (last 3 results) No results found for this basename: CKTOTAL, CKMB, TROPONINI, RELINDX, in the last 72 hours  Urinalysis    Component  Value  Date/Time    COLORURINE  AMBER*  09/23/2013 1723    APPEARANCEUR  CLOUDY*  09/23/2013 1723  LABSPEC  1.022  09/23/2013 1723    PHURINE  5.0  09/23/2013 1723    GLUCOSEU  NEGATIVE  09/23/2013 1723    HGBUR  LARGE*  09/23/2013 1723    BILIRUBINUR  SMALL*  09/23/2013 1723    KETONESUR  NEGATIVE  09/23/2013 1723    PROTEINUR  100*  09/23/2013 1723    UROBILINOGEN  1.0  09/23/2013 1723    NITRITE  NEGATIVE  09/23/2013 1723    LEUKOCYTESUR  NEGATIVE  09/23/2013 1723   Urine Drug Screen  No results found for this basename: labopia, cocainscrnur, labbenz, amphetmu, thcu, labbarb   Alcohol Level  No results found for this basename: eth   SIGNIFICANT DIAGNOSTIC STUDIES  Ct Angio Head and Neck W/cm &/or Wo Cm 09/23/2013  1. Mild to moderate generalized white matter disease is again seen.  2. No focal cortical infarct is evident.  3. Mild atherosclerotic calcifications within  the cavernous carotid arteries without significant stenosis.  4. Atherosclerotic calcifications in the left carotid bifurcation without significant stenosis.  5. Tortuosity of cervical internal carotid arteries is worse on the left. There is no significant stenosis.  6. Multilevel degenerative endplate changes of cervical spine as described.  Ct Head Wo Contrast 09/23/2013  No acute intracranial findings. Minimal chronic ischemic microvascular disease.  Mr Brain Wo Contrast 09/24/2013  1. 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  2. Additional acute/subacute infarcts involving the posterior left frontal lobe and superior left temporal lobe may be slightly older.  3. Extensive white matter disease. This likely reflects the sequela of chronic microvascular ischemia.  4. Asymmetric attenuation of left MCA branch vessels corresponding with the areas of infarct.  5. Signal loss in the vertebrobasilar system is artifactual based on appearance and comparison with prior study.  Carotid Doppler - See CTA of head and neck.  2D Echocardiogram ejection fraction 55%. No cardiac source of emboli identified.  Transesophageal Echocardiogram 09/27/13 Please see echo section for full report. Normal LV size with mild LV hypertrophy. EF 55-60%. Normal RV size and systolic function. Ascending aorta dilated to 4.4 cm. No LAA thrombus. Mild MR. There appeared to be a small PFO by bubble study. Grade II plaque in the descending thoracic aorta. Only potential source of embolus noted would be small PFO.  LE venous dopplers negative  EKG normal EKG, normal sinus rhythm, unchanged from previous tracings. For complete results please see formal report.  History of Present Illness Mr. MOIZ RYANT is a 68 y.o. male who had just returned from lunch 09/23/2013 after a Prostate biopsy when his wife noted he had sudden onset of right arm and leg weakness, became mute and not  following commands. EMS was called and found patient with a BP 218/110. Patient was brought to [facility] hospital where he was noted to have a left gaze preference, not crossing midline, mute and not following commands, right arm drift and right arm and leg weakness. Initial CT head was negative. NIHSS 19 on admission.  He received IV TPA and was admitted for further evaluation.  Hospital Course Patient S/p TPA with significant improvement with improvement in fields cuts and NIHSS 5. MRI reveals an acute cortical infarct involving the left parietal lobe with a 7 mm area of focal asymptomatic petechial hemorrhage. Additional acute/subacute infarcts involving the posterior left frontal lobe and superior left temporal lobe may be slightly older. Infarcts ikely embolic. However, he also has multiple  risk factors for stroke, including HTN, DM and HLD and overweight. Of note, he stated that he supposed to be on plavix but he stopped for his prostate biopsy. He was changed to aspirin 81 mg and Plavix 75 mg daily for secondary stroke prevention x 3 months then one alone.  Acute stroke  Carotid US <39% stenosis bilat ICAs  Echo: no definite source of emboli identified, EF normal  TEE shows EF 55-60%, positive for PFO, Grade II plaque in descending thoracic aorta, no definite cardiac source of emboli identified  Continue ASA 81 and plavix 75 for 3 months and then plavix monotherapy  Speech and Occupational therapy as outpatient, 24 hour assistance/supervision  HTN  Blood pressure 218/110 on admission  IncreasedCoreg to 12.5 mg daily and restarted losartan  BP goal 130/80  HLD  Continue Lipitor at home dose, 40 mg daily  LDL 70, meeting goal DM type II  restarted home DM meds - lantus Qhs & metformin 1000mg  bid with meals  Recommend adjustment for better glucose control.  HgbA1C 8.5, goal <6.5.  Elevated Hb and Hct  likely due to hemoconcentration  Recommend f/u to rule out polycythemia vera  Denies  smoking Overweight  Body mass index is 38.56 kg/(m^2).  Prostate cancer  - recent biopsy  - follow up with PCP    Patient's blood pressure today is much improved at 130/80. He denies any chest pain shortness of breath or dyspnea on exertion. Neurology has recommended aspirin and Plavix therapy for 3 months. However upon further review of his cardiologist last office visit, they wanted to continue aspirin and Plavix indefinitely due to his history of cardiovascular disease. He is tolerating both medications well without complication. He is currently receiving speech and occupational therapy. His cholesterol is well controlled with an LDL of 70. Unfortunately his hemoglobin A1c was elevated at 8.5. The patient discontinued invokana at some point in the past without my knowledge. He is also not checking his fasting or 2 hour postprandial sugars. He denies any hypoglycemic episodes. He is currently taking Lantus 35 units subcutaneous daily along with metformin 1000 mg by mouth twice a day.  I reviewed his hospital records. Dr. Lovena Le with cardiology recommended a four week cardiac monitor to evaluate for atrial fibrillation given episodes of multifocal atrial tachycardia occurring on telemetry. Patient states that that has not yet been scheduled. Otherwise he is doing well with no complications. Past Medical History  Diagnosis Date  . Arthritis   . Cholesterol serum elevated   . Hypertension   . Elevated PSA   . Coronary artery disease 08/05/2012    s/p Xience DES to the LAD  . Heart murmur   . Shortness of breath   . Prostate cancer 07/15/12  . Diabetes mellitus     x 8 years   Current Outpatient Prescriptions on File Prior to Visit  Medication Sig Dispense Refill  . allopurinol (ZYLOPRIM) 100 MG tablet Take 200 mg by mouth daily.      [name] amLODipine (NORVASC) 10 MG tablet Take 10 mg by mouth daily.      [name] aspirin EC 81 MG tablet Take 1 tablet (81 mg total) by mouth daily.      [name] atorvastatin  (LIPITOR) 40 MG tablet Take 40 mg by mouth daily.      . carvedilol (COREG) 12.5 MG tablet Take 1 tablet (12.5 mg total) by mouth 2 (two) times daily.  60 tablet  6  . Cholecalciferol (VITAMIN D3) 5000 UNITS  CAPS Take 10,000 Units by mouth daily.      . clopidogrel (PLAVIX) 75 MG tablet Take 1 tablet (75 mg total) by mouth daily with breakfast.  30 tablet  2  . insulin glargine (LANTUS) 100 units/mL SOLN Inject 35 Units into the skin at bedtime.       [name] losartan-hydrochlorothiazide (HYZAAR) 100-25 MG per tablet Take 1 tablet by mouth daily.      . metFORMIN (GLUCOPHAGE) 1000 MG tablet Take 1 tablet (1,000 mg total) by mouth 2 (two) times daily with a meal.  180 tablet  3  . zinc gluconate 50 MG tablet Take 100 mg by mouth daily.        No current facility-administered medications on file prior to visit.   Allergies  Allergen Reactions  . Fish Allergy Swelling   History   Social History  . Marital Status: Married    Spouse Name: N/A    Number of Children: N/A  . Years of Education: N/A   Occupational History  . Not on file.   Social History Main Topics  . Smoking status: Never Smoker   . Smokeless tobacco: Never Used  . Alcohol Use: No  . Drug Use: No  . Sexual Activity: Not Currently   Other Topics Concern  . Not on file   Social History Narrative  . No narrative on file   Past Surgical History  Procedure Laterality Date  . Kidney stones      2006  . Tonsillectomy      as adult  . Total knee arthroplasty  01/24/2011    Procedure: TOTAL KNEE ARTHROPLASTY;  Surgeon: Alta Corning;  Location: [place];  Service: Orthopedics;  Laterality: Left;  COMPUTER ASSISTED LEFT  TOTAL KNEE REPLACEMENT. Anesthesia a combination of regional and general.  . Coronary angioplasty with stent placement  08/05/2012    LAD, 1 stent  . Prostate biopsy  07/15/2012  . Tee without cardioversion N/A 09/27/2013    Procedure: TRANSESOPHAGEAL ECHOCARDIOGRAM (TEE);  Surgeon: Larey Dresser, MD;   Location: [facility] ENDOSCOPY;  Service: Cardiovascular;  Laterality: N/A;      Review of Systems  All other systems reviewed and are negative.      Objective:   Physical Exam  Vitals reviewed. Constitutional: He is oriented to person, place, and time. He appears well-developed and well-nourished.  Cardiovascular: Normal rate, regular rhythm, normal heart sounds and intact distal pulses.  Exam reveals no friction rub.   No murmur heard. Pulmonary/Chest: Effort normal and breath sounds normal. No respiratory distress. He has no wheezes. He has no rales.  Abdominal: Soft. Bowel sounds are normal. He exhibits no distension. There is no tenderness. There is no rebound and no guarding.  Musculoskeletal: Normal range of motion. He exhibits no edema.  Neurological: He is alert and oriented to person, place, and time. He has normal reflexes. He displays normal reflexes. No cranial nerve deficit. He exhibits normal muscle tone. Coordination normal.  Psychiatric: He exhibits abnormal recent memory.          Assessment & Plan:  1. Hospital discharge follow-up I spent 30 minutes reviewing the patient's hospital records. I scheduled him a followup appointment with cardiology so that they can arrange a 4 week cardiac monitor to monitor for paroxysmal atrial fibrillation.  If this is confirmed, I would discontinue Plavix and recommend starting the patient on eliquis.  I would continue aspirin for secondary prevention of cardiovascular disease. I think it is important that we  evaluate for cardiac arrhythmias to ensure that he is appropriately anticoagulated.  His blood pressure is well controlled today. I made no changes in his blood pressure medicine at this time. His cholesterol is well controlled. I made no change in his cholesterol medicine at this time. Urology has called the patient. His Gleason score has risen from 6-7. However they will defer treatment of this until after he is recovered from his  stroke. One area of improvement would be his diabetic control. I recommended that he check his fasting blood sugars and two-hour postprandial sugars for the next week. I want him to report to me the values at that time.  Increase Lantus until fasting blood sugars less than 130. I will then add rapid acting insulin if necessary to bring his two-hour postprandial sugars less than 160. Meanwhile I resumed invokana 300 mg poqday today. - CBC with Differential - COMPLETE METABOLIC PANEL WITH GFR - Ambulatory referral to Cardiology

## 2013-10-05 LAB — COMPLETE METABOLIC PANEL WITH GFR
ALT: 22 U/L (ref 0–53)
AST: 15 U/L (ref 0–37)
Albumin: 3.9 g/dL (ref 3.5–5.2)
Alkaline Phosphatase: 76 U/L (ref 39–117)
BUN: 18 mg/dL (ref 6–23)
CO2: 28 mEq/L (ref 19–32)
Calcium: 9.3 mg/dL (ref 8.4–10.5)
Chloride: 100 mEq/L (ref 96–112)
Creat: 1.19 mg/dL (ref 0.50–1.35)
GFR, Est African American: 73 mL/min
GFR, Est Non African American: 63 mL/min
Glucose, Bld: 246 mg/dL — ABNORMAL HIGH (ref 70–99)
Potassium: 4.3 mEq/L (ref 3.5–5.3)
Sodium: 138 mEq/L (ref 135–145)
Total Bilirubin: 0.8 mg/dL (ref 0.2–1.2)
Total Protein: 6.9 g/dL (ref 6.0–8.3)

## 2013-10-05 LAB — CBC WITH DIFFERENTIAL/PLATELET
Basophils Absolute: 0 10*3/uL (ref 0.0–0.1)
Basophils Relative: 0 % (ref 0–1)
Eosinophils Absolute: 0.3 10*3/uL (ref 0.0–0.7)
Eosinophils Relative: 4 % (ref 0–5)
HCT: 51.5 % (ref 39.0–52.0)
Hemoglobin: 17.3 g/dL — ABNORMAL HIGH (ref 13.0–17.0)
Lymphocytes Relative: 24 % (ref 12–46)
Lymphs Abs: 2 10*3/uL (ref 0.7–4.0)
MCH: 31.3 pg (ref 26.0–34.0)
MCHC: 33.6 g/dL (ref 30.0–36.0)
MCV: 93.1 fL (ref 78.0–100.0)
Monocytes Absolute: 0.8 10*3/uL (ref 0.1–1.0)
Monocytes Relative: 10 % (ref 3–12)
Neutro Abs: 5.1 10*3/uL (ref 1.7–7.7)
Neutrophils Relative %: 62 % (ref 43–77)
Platelets: 276 10*3/uL (ref 150–400)
RBC: 5.53 MIL/uL (ref 4.22–5.81)
RDW: 14.2 % (ref 11.5–15.5)
WBC: 8.2 10*3/uL (ref 4.0–10.5)

## 2013-10-06 ENCOUNTER — Encounter: Payer: Self-pay | Admitting: *Deleted

## 2013-10-10 ENCOUNTER — Telehealth: Payer: Self-pay | Admitting: Family Medicine

## 2013-10-10 NOTE — Telephone Encounter (Signed)
Patient is calling to give [place] the fasting blood sugar results  [phone] today  Please call him back at [phone]

## 2013-10-11 ENCOUNTER — Encounter: Payer: Self-pay | Admitting: Radiology

## 2013-10-11 ENCOUNTER — Encounter (INDEPENDENT_AMBULATORY_CARE_PROVIDER_SITE_OTHER): Payer: Medicare Other

## 2013-10-11 DIAGNOSIS — I4949 Other premature depolarization: Secondary | ICD-10-CM | POA: Diagnosis not present

## 2013-10-11 DIAGNOSIS — I6350 Cerebral infarction due to unspecified occlusion or stenosis of unspecified cerebral artery: Secondary | ICD-10-CM

## 2013-10-11 DIAGNOSIS — I639 Cerebral infarction, unspecified: Secondary | ICD-10-CM

## 2013-10-11 NOTE — Telephone Encounter (Signed)
Pt aware and will call back in 2 weeks

## 2013-10-11 NOTE — Progress Notes (Signed)
Patient ID: James Mcclain, male   DOB: 1945/02/26, 68 y.o.   MRN: [id number] [employer] 30 day monitor applied. EOS-11-12-13

## 2013-10-11 NOTE — Telephone Encounter (Signed)
Increase lantus to 45 units sq daily and recheck fbs and 2 hr pps in 2 weeks.

## 2013-10-17 ENCOUNTER — Telehealth: Payer: Self-pay | Admitting: Family Medicine

## 2013-10-17 NOTE — Telephone Encounter (Signed)
[phone]  Pt is calling with blood sugar results 144-8/20 148-8/21 114-8/22 117-8/23 134- 8/24

## 2013-10-20 ENCOUNTER — Other Ambulatory Visit: Payer: Self-pay | Admitting: Cardiology

## 2013-10-20 NOTE — Telephone Encounter (Signed)
Pt's wife aware.

## 2013-10-20 NOTE — Telephone Encounter (Signed)
Fasting sugars are slightly high, increase lantus to 45 units daily and recheck sugars in 1 week.

## 2013-10-21 ENCOUNTER — Other Ambulatory Visit: Payer: Self-pay | Admitting: *Deleted

## 2013-10-21 MED ORDER — ATORVASTATIN CALCIUM 40 MG PO TABS: 40.0000 mg | ORAL_TABLET | Freq: Every day | ORAL | Status: DC

## 2013-10-24 ENCOUNTER — Ambulatory Visit: Payer: Medicare Other

## 2013-10-24 ENCOUNTER — Ambulatory Visit: Payer: Medicare Other | Attending: Neurology | Admitting: Occupational Therapy

## 2013-10-24 DIAGNOSIS — Z5189 Encounter for other specified aftercare: Secondary | ICD-10-CM | POA: Insufficient documentation

## 2013-10-24 DIAGNOSIS — R4701 Aphasia: Secondary | ICD-10-CM | POA: Diagnosis not present

## 2013-10-28 ENCOUNTER — Telehealth: Payer: Self-pay | Admitting: Neurology

## 2013-10-28 NOTE — Telephone Encounter (Signed)
Faxed summary for occupational therapy services to [facility] on 10/28/13.

## 2013-11-01 ENCOUNTER — Telehealth: Payer: Self-pay | Admitting: Family Medicine

## 2013-11-01 NOTE — Telephone Encounter (Signed)
These are excellent.  No changes.  Now what are his sugars running 2 hrs after meals?

## 2013-11-01 NOTE — Telephone Encounter (Signed)
10-27-13  74,93,103,90,89,114 through today  Sugar readings fasting  [phone]

## 2013-11-01 NOTE — Telephone Encounter (Signed)
Patient's wife aware and states that she has not been checking his after meal BS but will do that for a week and call us back with those numbers.

## 2013-11-08 ENCOUNTER — Other Ambulatory Visit: Payer: Self-pay

## 2013-11-08 ENCOUNTER — Encounter: Payer: Medicare Other | Admitting: Speech Pathology

## 2013-11-11 ENCOUNTER — Ambulatory Visit: Payer: Medicare Other | Attending: Neurology

## 2013-11-11 DIAGNOSIS — R4701 Aphasia: Secondary | ICD-10-CM | POA: Insufficient documentation

## 2013-11-11 DIAGNOSIS — Z5189 Encounter for other specified aftercare: Secondary | ICD-10-CM | POA: Insufficient documentation

## 2013-11-14 ENCOUNTER — Encounter: Payer: Medicare Other | Admitting: Internal Medicine

## 2013-11-14 ENCOUNTER — Other Ambulatory Visit: Payer: Self-pay | Admitting: Family Medicine

## 2013-11-14 DIAGNOSIS — E119 Type 2 diabetes mellitus without complications: Secondary | ICD-10-CM

## 2013-11-15 ENCOUNTER — Encounter: Payer: Medicare Other | Admitting: Speech Pathology

## 2013-11-17 ENCOUNTER — Ambulatory Visit: Payer: Medicare Other | Admitting: Speech Pathology

## 2013-11-21 ENCOUNTER — Other Ambulatory Visit: Payer: Self-pay | Admitting: Cardiology

## 2013-11-22 ENCOUNTER — Encounter: Payer: Self-pay | Admitting: Internal Medicine

## 2013-11-28 ENCOUNTER — Encounter: Payer: Self-pay | Admitting: Neurology

## 2013-11-28 ENCOUNTER — Encounter (INDEPENDENT_AMBULATORY_CARE_PROVIDER_SITE_OTHER): Payer: Self-pay

## 2013-11-28 ENCOUNTER — Ambulatory Visit (INDEPENDENT_AMBULATORY_CARE_PROVIDER_SITE_OTHER): Payer: Medicare Other | Admitting: Neurology

## 2013-11-28 ENCOUNTER — Ambulatory Visit: Payer: Medicare Other | Admitting: Neurology

## 2013-11-28 VITALS — BP 139/81 | HR 79 | Ht 70.0 in | Wt 263.0 lb

## 2013-11-28 DIAGNOSIS — E785 Hyperlipidemia, unspecified: Secondary | ICD-10-CM

## 2013-11-28 DIAGNOSIS — E1149 Type 2 diabetes mellitus with other diabetic neurological complication: Secondary | ICD-10-CM | POA: Insufficient documentation

## 2013-11-28 DIAGNOSIS — I10 Essential (primary) hypertension: Secondary | ICD-10-CM

## 2013-11-28 DIAGNOSIS — I63412 Cerebral infarction due to embolism of left middle cerebral artery: Secondary | ICD-10-CM

## 2013-11-28 NOTE — Progress Notes (Signed)
STROKE NEUROLOGY FOLLOW UP NOTE  NAME: James Mcclain DOB: 03-30-1945  REASON FOR VISIT: stroke follow up HISTORY FROM: pt and chart  Today we had the pleasure of seeing James Mcclain in follow-up at our Neurology Clinic. Pt was accompanied by wife.   History Summary [name] is a 68 y.o. male with PMH of HTN, HLD, DM was admitted to [facility] on 09/23/13 for sudden onset of aphasia, right facial droop, right arm and leg paresis and left gaze preference. S/p TPA with significant improvement. 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 Additional acute/subacute infarcts involving the posterior left frontal lobe and superior left temporal lobe may be slightly older. Consistent with embolic. TEE showed PFO but otherwise no cardiac source of emboli found. LE venous doppler ruled out DVT. He was discharged with aspirin 81 mg and Plavix 75 mg daily for secondary stroke prevention.   Interval History During the interval time, the patient has been doing well. He had no recurrent symptoms. He had cardionet monitoring for 30 days and ended on 11/12/13. Was told no AF episodes detected but still waiting for official report.    He stated that his right ear hearing loss got worse and has scheduled to see ENT soon. He check his BP at home around 130-140 and glucose 84 today.   REVIEW OF SYSTEMS: Full 14 system review of systems performed and notable only for those listed below and in HPI above, all others are negative:  Constitutional: N/A  Cardiovascular: N/A  Ear/Nose/Throat: hearing loss Skin: N/A  Eyes: blurry vision  Respiratory: N/A  Gastroitestinal: N/A  Genitourinary: diarrhea Hematology/Lymphatic: N/A  Endocrine: N/A  Musculoskeletal: N/A  Allergy/Immunology: food alergy  Neurological: memory loss, speech difficulty  Psychiatric: confusion  The following represents the patient's updated allergies and side effects  list: Allergies  Allergen Reactions  . Fish Allergy Swelling    Labs since last visit of relevance include the following: Results for orders placed in visit on 10/04/13  CBC WITH DIFFERENTIAL      Result Value Ref Range   WBC 8.2  4.0 - 10.5 K/uL   RBC 5.53  4.22 - 5.81 MIL/uL   Hemoglobin 17.3 (*) 13.0 - 17.0 g/dL   HCT 51.5  39.0 - 52.0 %   MCV 93.1  78.0 - 100.0 fL   MCH 31.3  26.0 - 34.0 pg   MCHC 33.6  30.0 - 36.0 g/dL   RDW 14.2  11.5 - 15.5 %   Platelets 276  150 - 400 K/uL   Neutrophils Relative % 62  43 - 77 %   Neutro Abs 5.1  1.7 - 7.7 K/uL   Lymphocytes Relative 24  12 - 46 %   Lymphs Abs 2.0  0.7 - 4.0 K/uL   Monocytes Relative 10  3 - 12 %   Monocytes Absolute 0.8  0.1 - 1.0 K/uL   Eosinophils Relative 4  0 - 5 %   Eosinophils Absolute 0.3  0.0 - 0.7 K/uL   Basophils Relative 0  0 - 1 %   Basophils Absolute 0.0  0.0 - 0.1 K/uL   Smear Review Criteria for review not met    COMPLETE METABOLIC PANEL WITH GFR      Result Value Ref Range   Sodium 138  135 - 145 mEq/L   Potassium 4.3  3.5 - 5.3 mEq/L   Chloride 100  96 - 112 mEq/L   CO2 28  19 - 32 mEq/L   Glucose, Bld 246 (*) 70 - 99 mg/dL   BUN 18  6 - 23 mg/dL   Creat 1.19  0.50 - 1.35 mg/dL   Total Bilirubin 0.8  0.2 - 1.2 mg/dL   Alkaline Phosphatase 76  39 - 117 U/L   AST 15  0 - 37 U/L   ALT 22  0 - 53 U/L   Total Protein 6.9  6.0 - 8.3 g/dL   Albumin 3.9  3.5 - 5.2 g/dL   Calcium 9.3  8.4 - 10.5 mg/dL   GFR, Est African American 73     GFR, Est Non African American 63      The neurologically relevant items on the patient's problem list were reviewed on today's visit.  Neurologic Examination  A problem focused neurological exam (12 or more points of the single system neurologic examination, vital signs counts as 1 point, cranial nerves count for 8 points) was performed.  Blood pressure 139/81, pulse 79, height $RemoveBe'5\' 10"'mfNivVsFU$  (1.778 m), weight 263 lb (119.296 kg).  General - Well nourished, well  developed, in no apparent distress.  Ophthalmologic - not able to see through.  Cardiovascular - Regular rate and rhythm with no murmur.  Mental Status -  Level of arousal and orientation to time, place, and person were intact. Language including expression, naming, comprehension was assessed and found intact. Still has some difficulty with repetition of complicated sentences.  Cranial Nerves II - XII - II - Visual field intact OU. III, IV, VI - Extraocular movements intact. V - Facial sensation intact bilaterally. VII - Facial movement intact bilaterally. VIII - Hearing & vestibular intact bilaterally. X - Palate elevates symmetrically. XI - Chin turning & shoulder shrug intact bilaterally. XII - Tongue protrusion intact.  Motor Strength - The patient's strength was normal in all extremities and pronator drift was absent.  Bulk was normal and fasciculations were absent.   Motor Tone - Muscle tone was assessed at the neck and appendages and was normal.  Reflexes - The patient's reflexes were 1+ in all extremities and he had no pathological reflexes.  Sensory - Light touch, temperature/pinprick and Romberg testing were assessed and were normal.    Coordination - The patient had normal movements in the hands and feet with no ataxia or dysmetria.  Tremor was absent.  Gait and Station - The patient's transfers, posture, gait, station, and turns were observed as normal.  Data reviewed: I personally reviewed the images and agree with the radiology interpretations.  Ct Angio Head and Neck W/cm &/or Wo Cm  09/23/2013  1. Mild to moderate generalized white matter disease is again seen.  2. No focal cortical infarct is evident.  3. Mild atherosclerotic calcifications within the cavernous carotid arteries without significant stenosis.  4. Atherosclerotic calcifications in the left carotid bifurcation without significant stenosis.  5. Tortuosity of cervical internal carotid arteries is  worse on the left. There is no significant stenosis.  6. Multilevel degenerative endplate changes of cervical spine as described.  Ct Head Wo Contrast  09/23/2013  No acute intracranial findings. Minimal chronic ischemic microvascular disease.  Mr Brain Wo Contrast  09/24/2013  1. 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  2. Additional acute/subacute infarcts involving the posterior left frontal lobe and superior left temporal lobe may be slightly older.  3. Extensive white matter disease.  This likely reflects the sequela of chronic microvascular ischemia.  4. Asymmetric attenuation of left MCA branch vessels corresponding with the areas of infarct.  5. Signal loss in the vertebrobasilar system is artifactual based on appearance and comparison with prior study.  2D Echocardiogram ejection fraction 55%. No cardiac source of emboli identified.  Transesophageal Echocardiogram 09/27/13 Please see echo section for full report. Normal LV size with mild LV hypertrophy. EF 55-60%. Normal RV size and systolic function. Ascending aorta dilated to 4.4 cm. No LAA thrombus. Mild MR. There appeared to be a small PFO by bubble study. Grade II plaque in the descending thoracic aorta. Only potential source of embolus noted would be small PFO.  EKG normal EKG, normal sinus rhythm, unchanged from previous tracings.  LDL 70 and A1C 8.5  Assessment: As you may recall, he is a 68 y.o. African American male with PMH of HTN, HLD, DM was admitted for left MCA stroke. His MRI also suspicious for chronic left frontal and temporal stroke. Suspicious for embolic etiology. CTA head and neck did not showed significant vessel stenosis and TEE showed small PFO but no other cardiac source of emboli. LE DVT screening was negative. Had cardionet for 30 days without report of Afib episodes (official report pending). Pt at this time is RESPECT-ESUS candidtate and he seems to be  interested in that too. Will continue ASA and plavix for now, and wait to see if he will be enrolled into the study.  Plan:  - continue ASA plavix and liptor for stroke prevention for now. - if pt is willing to enroll to RESPECT trail, then he will start on investigational drugs. If not, will switch to monotherapy at the end of this month - follow up with Dr. Dennard Schaumann for stroke risk factor modification - speech exercise at home - monitor BP and glucose at home - follow up in 3 months.  No orders of the defined types were placed in this encounter.    No orders of the defined types were placed in this encounter.    Patient Instructions  - continue ASA and plavix for now for stroke prevention - continue lipitor for stroke prevention - will discuss with  You the research trial designed for pt like you - follow up with PCP for stroke risk factor modification - continue speech exercise at home - follow up in 3 months.   Rosalin Hawking, MD PhD [facility] [address], [place], [place] [address] [phone]

## 2013-11-28 NOTE — Patient Instructions (Signed)
-   continue ASA and plavix for now for stroke prevention - continue lipitor for stroke prevention - will discuss with  You the research trial designed for pt like you - follow up with PCP for stroke risk factor modification - continue speech exercise at home - follow up in 3 months.

## 2013-11-29 ENCOUNTER — Other Ambulatory Visit: Payer: Self-pay | Admitting: Family Medicine

## 2013-11-29 MED ORDER — ATORVASTATIN CALCIUM 40 MG PO TABS: 40.0000 mg | ORAL_TABLET | Freq: Every day | ORAL | Status: DC

## 2013-11-29 NOTE — Telephone Encounter (Signed)
Medication refilled per protocol. 

## 2013-12-12 ENCOUNTER — Telehealth: Payer: Self-pay

## 2013-12-12 NOTE — Telephone Encounter (Signed)
I called patient to follow up on the Respect ESUS study that Dr. Erlinda Hong and I had discussed with him to see if he is interested in participating in the study. I left a message on his VM on his cell phone to call me. I also tried the home number but there was no answering machine to leave a message.

## 2013-12-19 ENCOUNTER — Ambulatory Visit: Payer: Medicare Other | Admitting: Cardiology

## 2013-12-21 ENCOUNTER — Other Ambulatory Visit: Payer: Self-pay | Admitting: Cardiology

## 2013-12-27 ENCOUNTER — Encounter: Payer: Self-pay | Admitting: Neurology

## 2013-12-27 ENCOUNTER — Telehealth: Payer: Self-pay | Admitting: Neurology

## 2013-12-27 ENCOUNTER — Ambulatory Visit (INDEPENDENT_AMBULATORY_CARE_PROVIDER_SITE_OTHER): Payer: Self-pay | Admitting: Neurology

## 2013-12-27 DIAGNOSIS — I63412 Cerebral infarction due to embolism of left middle cerebral artery: Secondary | ICD-10-CM

## 2013-12-27 NOTE — Progress Notes (Signed)
Pt came in today for RESPECT ESUS study screening. Pt has CAD and should be on ASA during the study. Therefore, he needs to be on 3 investigational drug plan to make sure he has ASA coverage. Pt expressed understanding. I did physical examination and only found to have mild expressive aphasia with mild difficulty with repetition, no other neurological deficit. His NIHSS = 1 and mRS = 1.

## 2013-12-27 NOTE — Telephone Encounter (Signed)
Patient left a message for the Research Coordinator for the Respect-Esus trial.

## 2013-12-28 ENCOUNTER — Encounter: Payer: Medicare Other | Admitting: Neurology

## 2013-12-30 ENCOUNTER — Encounter: Payer: Self-pay | Admitting: Family Medicine

## 2013-12-30 ENCOUNTER — Ambulatory Visit (INDEPENDENT_AMBULATORY_CARE_PROVIDER_SITE_OTHER): Payer: Medicare Other | Admitting: Family Medicine

## 2013-12-30 VITALS — BP 130/74 | HR 78 | Temp 98.8°F | Resp 18 | Ht 70.0 in | Wt 264.0 lb

## 2013-12-30 DIAGNOSIS — K529 Noninfective gastroenteritis and colitis, unspecified: Secondary | ICD-10-CM

## 2013-12-30 DIAGNOSIS — Z23 Encounter for immunization: Secondary | ICD-10-CM

## 2013-12-30 NOTE — Progress Notes (Signed)
Subjective:    Patient ID: James Mcclain, male    DOB: Apr 26, 1945, 68 y.o.   MRN: [id number]  HPI  Patient has a history of insulin-dependent diabetes mellitus. He is also taking metformin 1000 mg by mouth twice a day. Over the last month he has had constant diarrhea. He reports 3-6  Loose bowel movements every day. He denies any fevers or chills. He denies any nausea or vomiting. He denies any melena or hematochezia. He denies any weight loss or joint pain or rashes. He is overdue for a hemoglobin A1c as well as fasting lab work. Past Medical History  Diagnosis Date  . Arthritis   . Cholesterol serum elevated   . Hypertension   . Elevated PSA   . Coronary artery disease 08/05/2012    s/p Xience DES to the LAD  . Heart murmur   . Shortness of breath   . Prostate cancer 07/15/12  . Diabetes mellitus     x 8 years   Past Surgical History  Procedure Laterality Date  . Kidney stones      2006  . Tonsillectomy      as adult  . Total knee arthroplasty  01/24/2011    Procedure: TOTAL KNEE ARTHROPLASTY;  Surgeon: Alta Corning;  Location: [place];  Service: Orthopedics;  Laterality: Left;  COMPUTER ASSISTED LEFT  TOTAL KNEE REPLACEMENT. Anesthesia a combination of regional and general.  . Coronary angioplasty with stent placement  08/05/2012    LAD, 1 stent  . Prostate biopsy  07/15/2012  . Tee without cardioversion N/A 09/27/2013    Procedure: TRANSESOPHAGEAL ECHOCARDIOGRAM (TEE);  Surgeon: Larey Dresser, MD;  [facility] ENDOSCOPY;  Service: Cardiovascular;  Laterality: N/A;   Current Outpatient Prescriptions on File Prior to Visit  Medication Sig Dispense Refill  . allopurinol (ZYLOPRIM) 100 MG tablet Take 200 mg by mouth daily.    [name] amLODipine (NORVASC) 10 MG tablet Take 10 mg by mouth daily.    [name] aspirin EC 81 MG tablet Take 1 tablet (81 mg total) by mouth daily.    [name] atorvastatin (LIPITOR) 40 MG tablet Take 1 tablet (40 mg total) by mouth daily. 90 tablet 1  . carvedilol (COREG)  12.5 MG tablet Take 1 tablet (12.5 mg total) by mouth 2 (two) times daily. 60 tablet 6  . Cholecalciferol (VITAMIN D3) 5000 UNITS CAPS Take 10,000 Units by mouth daily.    . clopidogrel (PLAVIX) 75 MG tablet TAKE ONE TABLET BY MOUTH ONCE DAILY WITH BREAKFAST 30 tablet 0  . insulin glargine (LANTUS) 100 units/mL SOLN Inject 35 Units into the skin at bedtime.     [name] losartan-hydrochlorothiazide (HYZAAR) 100-25 MG per tablet TAKE ONE TABLET BY MOUTH ONCE DAILY 90 tablet 1  . metFORMIN (GLUCOPHAGE) 1000 MG tablet Take 1 tablet (1,000 mg total) by mouth 2 (two) times daily with a meal. 180 tablet 3  . zinc gluconate 50 MG tablet Take 100 mg by mouth daily.     . Canagliflozin (INVOKANA) 300 MG TABS Take 1 tablet (300 mg total) by mouth daily. 90 tablet 3   No current facility-administered medications on file prior to visit.   Allergies  Allergen Reactions  . Fish Allergy Swelling   History   Social History  . Marital Status: Married    Spouse Name: N/A    Number of Children: 3  . Years of Education: 12th   Occupational History  . retired    Social History Main Topics  .  Smoking status: Never Smoker   . Smokeless tobacco: Never Used  . Alcohol Use: No  . Drug Use: No  . Sexual Activity: Not Currently   Other Topics Concern  . Not on file   Social History Narrative   [name]   Patient is right handed   Patient drinks coffee and sodas     Review of Systems  All other systems reviewed and are negative.      Objective:   Physical Exam  Constitutional: He appears well-developed and well-nourished. No distress.  Cardiovascular: Normal rate, regular rhythm and normal heart sounds.   No murmur heard. Pulmonary/Chest: Effort normal and breath sounds normal. No respiratory distress. He has no wheezes. He has no rales.  Abdominal: Soft. Bowel sounds are normal. He exhibits no distension and no mass. There is no tenderness. There is no rebound and no  guarding.  Musculoskeletal: He exhibits no edema.  Skin: He is not diaphoretic.  Vitals reviewed.         Assessment & Plan:  Chronic diarrhea  I believe the patient's diarrhea could be due to his metformin. He has no symptoms concerning for inflammatory bowel disease. I would like the patient to discontinue metformin for the next 2 weeks. I will replace it with Januvia 100 mg by mouth daily. If diarrhea persists after discontinuing metformin, I would like the patient to return for TSH, celiac panel, and sedimentation rate. I would also consider stool studies as well as a colonoscopy. I've also asked the patient to return fasting for a CMP, fasting lipid panel, and hemoglobin A1c

## 2013-12-30 NOTE — Addendum Note (Signed)
Addended by: Shary Decamp B on: 12/30/2013 04:35 PM   Modules accepted: Orders

## 2013-12-31 ENCOUNTER — Other Ambulatory Visit: Payer: Self-pay | Admitting: Family Medicine

## 2014-01-02 ENCOUNTER — Telehealth: Payer: Self-pay

## 2014-01-02 ENCOUNTER — Encounter: Payer: Self-pay | Admitting: Physician Assistant

## 2014-01-02 ENCOUNTER — Ambulatory Visit (INDEPENDENT_AMBULATORY_CARE_PROVIDER_SITE_OTHER): Payer: Medicare Other | Admitting: Physician Assistant

## 2014-01-02 VITALS — BP 130/78 | HR 70 | Ht 70.0 in | Wt 261.0 lb

## 2014-01-02 DIAGNOSIS — E785 Hyperlipidemia, unspecified: Secondary | ICD-10-CM

## 2014-01-02 DIAGNOSIS — I10 Essential (primary) hypertension: Secondary | ICD-10-CM

## 2014-01-02 DIAGNOSIS — I712 Thoracic aortic aneurysm, without rupture: Secondary | ICD-10-CM

## 2014-01-02 DIAGNOSIS — I493 Ventricular premature depolarization: Secondary | ICD-10-CM

## 2014-01-02 DIAGNOSIS — I7121 Aneurysm of the ascending aorta, without rupture: Secondary | ICD-10-CM

## 2014-01-02 DIAGNOSIS — Z8673 Personal history of transient ischemic attack (TIA), and cerebral infarction without residual deficits: Secondary | ICD-10-CM | POA: Insufficient documentation

## 2014-01-02 DIAGNOSIS — I2510 Atherosclerotic heart disease of native coronary artery without angina pectoris: Secondary | ICD-10-CM

## 2014-01-02 DIAGNOSIS — E118 Type 2 diabetes mellitus with unspecified complications: Secondary | ICD-10-CM

## 2014-01-02 NOTE — Progress Notes (Signed)
Cardiology Office Note   Date:  01/02/2014   ID:  Leroi, Haque 1945-02-28, MRN [id number]  PCP:  Odette Fraction, MD  Cardiologist:  Dr. Loralie Champagne   Electrophysiologist:  Dr. Cristopher Peru    History of Present Illness: James Mcclain is a 68 y.o. male with a history of CAD status post DES to the proximal LAD in 07/2012, diabetes, HTN, CKD, prostate CA. Last seen by Dr. Aundra Dubin 05/2013.  Admitted in 09/2013 with an acute cortical infarct involving the left parietal lobe s/p IV tPA.  Patient was noted to have acute/subacute infarcts in the posterior left frontal lobe and superior left temporal lobe.  Patient was seen by EP at that time given cryptogenic stroke. He was noted to have MAT on tele and thought to be high risk for AFib.  Outpatient event monitor was arranged. Dr. Lovena Le saw the patient in the hospital and planned to see him back in follow-up to discuss ILR (if his event monitor was unremarkable).   The patient denies any chest pain, significant dyspnea, syncope, orthopnea, PND, edema.  He does note continued difficulty with his speech that is slow to recover. He could not afford going to speech therapy.  Studies:  - LHC (6/14):  Proximal LAD 90%, proximal ramus 30%, mid circumflex 30%, mid RCA 40% >> PCI: 3 x 33 mm Xience DES to the proximal LAD  - Echo (8/15):  Mild LVH, EF 57%, grade 1 diastolic dysfunction, mildly dilated aortic root and moderately dilated ascending aorta (aortic root 41 mm, ascending aorta 44 mm), mild LAE, normal RV function, trivial effusion  - TEE (6/14): Mild LVH, EF 60%, normal RV size and function, grade 4 plaque in the descending aorta, grade 3 plaque in the arch, very small PFO  - TEE (8/15): Mild LVH, EF 55-60%, ascending aorta dilated 4.4 cm, No LAA clot, mild MR, small PFO by bubble study, grade 2 plaque in the descending thoracic aorta (venous US negative for DVT bilaterally)  - Nuclear (5/14):  Primarily fixed inferior wall defect, poor  exercise tolerance, EF 56%  - Event Monitor (10/15): NSR, no arrhythmias  Recent Labs/Images:  09/24/2013: LDL (calc) 70 10/04/2013: ALT 22; BUN 18; Creatinine 1.19; Hemoglobin 17.3*; Potassium 4.3; Sodium 138   Ct Angio Head W/cm &/or Wo Cm   09/23/2013  IMPRESSION: 1. Mild to moderate generalized white matter disease is again seen. 2. No focal cortical infarct is evident. 3. Mild atherosclerotic calcifications within the cavernous carotid arteries without significant stenosis. 4. Atherosclerotic calcifications in the left carotid bifurcation without significant stenosis. 5. Tortuosity of cervical internal carotid arteries is worse on the left. There is no significant stenosis. 6. Multilevel degenerative endplate changes of cervical spine as described.   Electronically Signed   By: Lawrence Santiago M.D.   On: 09/23/2013 17:36     Wt Readings from Last 3 Encounters:  01/02/14 261 lb (118.389 kg)  12/30/13 264 lb (119.75 kg)  11/28/13 263 lb (119.296 kg)    PMH: 1. HTN 2. Type II diabetes 3. Gout 4. OA s/p TKR 5. CKD: Suspect diabetic nephropathy 6. Hyperlipidemia 7. Prostate cancer: Diagnosed 5/14.  8. CAD: Echo (5/14) with EF 55-60%, moderate LVH. ETT-Sestamibi with hypertensive BP response, severe dyspnea, inferior fixed defect with EF 56%. Echo with moderate LVH, EF 55-60%. LHC (6/14) with 90% pLAD stenosis; this was treated with Xience DES.  Past Medical History  Diagnosis Date  . Arthritis   . Cholesterol serum elevated   .  Hypertension   . Elevated PSA   . Coronary artery disease 08/05/2012    s/p Xience DES to the LAD  . Heart murmur   . Shortness of breath   . Prostate cancer 07/15/12  . Diabetes mellitus     x 8 years  . Stroke     Cryptogenic, 09/2013, event monitor with NSR    Current Outpatient Prescriptions  Medication Sig Dispense Refill  . allopurinol (ZYLOPRIM) 100 MG tablet TAKE TWO TABLETS BY MOUTH ONCE DAILY 30 tablet 11  . amLODipine (NORVASC) 10 MG  tablet Take 10 mg by mouth daily.    [name] aspirin EC 81 MG tablet Take 1 tablet (81 mg total) by mouth daily.    [name] atorvastatin (LIPITOR) 40 MG tablet Take 1 tablet (40 mg total) by mouth daily. 90 tablet 1  . carvedilol (COREG) 12.5 MG tablet Take 1 tablet (12.5 mg total) by mouth 2 (two) times daily. 60 tablet 6  . Cholecalciferol (VITAMIN D3) 5000 UNITS CAPS Take 10,000 Units by mouth daily.    . clopidogrel (PLAVIX) 75 MG tablet TAKE ONE TABLET BY MOUTH ONCE DAILY WITH BREAKFAST 30 tablet 0  . insulin glargine (LANTUS) 100 units/mL SOLN Inject 35 Units into the skin at bedtime.     [name] losartan-hydrochlorothiazide (HYZAAR) 100-25 MG per tablet TAKE ONE TABLET BY MOUTH ONCE DAILY 90 tablet 1  . metFORMIN (GLUCOPHAGE) 1000 MG tablet Take 1 tablet (1,000 mg total) by mouth 2 (two) times daily with a meal. (Patient taking differently: Take 1,000 mg by mouth daily with breakfast. ) 180 tablet 3   No current facility-administered medications for this visit.     Allergies:   Fish allergy   Social History:  The patient  reports that he has never smoked. He has never used smokeless tobacco. He reports that he does not drink alcohol or use illicit drugs.   Family History:  The patient's family history includes Diabetes in his sister; Heart Problems in his brother, father, and another family member; Hypertension in his brother; Leukemia in his mother; Stroke in his brother and another family member. There is no history of Heart attack.   ROS:  Please see the history of present illness.       All other systems reviewed and negative.    PHYSICAL EXAM: VS:  BP 130/78 mmHg  Pulse 70  Ht 5\' 10"  (1.778 m)  Wt 261 lb (118.389 kg)  BMI 37.45 kg/m2 Well nourished, well developed, in no acute distress HEENT: normal Neck:  no JVD Cardiac:  normal S1, S2;  RRR; no murmur Lungs:   clear to auscultation bilaterally, no wheezing, rhonchi or rales Abd: soft, nontender, no hepatomegaly Ext:  no edema Skin:  warm and dry Neuro:  CNs 2-12 intact, no focal abnormalities noted  EKG:  NSR, HR 70, inf Q waves, NSSTTW changes, PVCs, no change from prior tracing.        ASSESSMENT AND PLAN:  1.  Coronary artery disease:  Doing well.  No angina.  Continue aspirin, Plavix, beta blocker, statin. 2.  Essential hypertension:  Blood pressure controlled. Continue amlodipine, beta blocker, Hyzaar. He has frequent PVCs on his EKG. Pain BMET today. 3.  Hyperlipidemia:  Continue statin.  09/24/2013: HDL Cholesterol by NMR 33*; LDL (calc) 70 10/04/2013: ALT 22   4.  Type 2 diabetes mellitus with complication:  FU with PCP as directed.  5.  History of CVA (cerebrovascular accident):  Cryptogenic CVA in 09/2013. Event monitor  demonstrated no atrial fibrillation. Neurology has enrolled him in the RESPECT-ESUS trial.  I will have him FU with Dr. Cristopher Peru to see if ILR is indicated.   6.  Ascending aortic aneurysm:  I will review with Dr. Loralie Champagne regarding FU testing (ie Echo vs CTA in 1 year).  Disposition:   FU with Dr. Loralie Champagne 6 mos.   Signed, Versie Starks, MHS 01/02/2014 4:09 PM    [facility] [place], [place], [place]  [address] Phone: [phone]; Fax: [phone]

## 2014-01-02 NOTE — Patient Instructions (Signed)
LAB WORK TODAY; BMET  Your physician wants you to follow-up in: [place] DR. Aundra Dubin You will receive a reminder letter in the mail two months in advance. If you don't receive a letter, please call our office to schedule the follow-up appointment.   Your physician recommends that you schedule a follow-up appointment in: NEXT AVAILABLE WITH DR. Lovena Le

## 2014-01-02 NOTE — Telephone Encounter (Signed)
Patient's wife, Vaughan Basta, called this past Friday afternoon.  I was trying to return her call.  There was no answer or AM on the home number.  I did leave a message on the cell phone number for them to call me back.

## 2014-01-03 LAB — BASIC METABOLIC PANEL
BUN: 26 mg/dL — ABNORMAL HIGH (ref 6–23)
CO2: 31 mEq/L (ref 19–32)
Calcium: 9.6 mg/dL (ref 8.4–10.5)
Chloride: 105 mEq/L (ref 96–112)
Creatinine, Ser: 1.5 mg/dL (ref 0.4–1.5)
GFR: 61.88 mL/min (ref >60.00)
Glucose, Bld: 121 mg/dL — ABNORMAL HIGH (ref 70–99)
Potassium: 4.8 mEq/L (ref 3.5–5.1)
Sodium: 145 mEq/L (ref 135–145)

## 2014-01-04 ENCOUNTER — Encounter: Payer: Self-pay | Admitting: Neurology

## 2014-01-06 ENCOUNTER — Ambulatory Visit (INDEPENDENT_AMBULATORY_CARE_PROVIDER_SITE_OTHER): Payer: Self-pay | Admitting: Neurology

## 2014-01-06 DIAGNOSIS — I749 Embolism and thrombosis of unspecified artery: Secondary | ICD-10-CM

## 2014-01-06 NOTE — Progress Notes (Signed)
I briefly met with the patient and answered his questions about the trial and checked that he met all inclusion and exclusion criteria prior to randomization

## 2014-01-13 ENCOUNTER — Telehealth: Payer: Self-pay | Admitting: *Deleted

## 2014-01-13 ENCOUNTER — Other Ambulatory Visit: Payer: Self-pay | Admitting: Physician Assistant

## 2014-01-13 DIAGNOSIS — I7121 Aneurysm of the ascending aorta, without rupture: Secondary | ICD-10-CM

## 2014-01-13 DIAGNOSIS — I712 Thoracic aortic aneurysm, without rupture: Secondary | ICD-10-CM

## 2014-01-13 NOTE — Telephone Encounter (Signed)
From: Larey Dresser, MD      Sent: 01/08/2014  9:34 PM      To: Liliane Shi, PA-C        MRA chest in 1 year.

## 2014-01-14 ENCOUNTER — Other Ambulatory Visit: Payer: Self-pay | Admitting: Cardiology

## 2014-01-23 ENCOUNTER — Telehealth: Payer: Self-pay | Admitting: Family Medicine

## 2014-01-23 MED ORDER — SITAGLIPTIN PHOSPHATE 100 MG PO TABS: 100.0000 mg | ORAL_TABLET | Freq: Every day | ORAL | Status: DC

## 2014-01-23 NOTE — Telephone Encounter (Signed)
Patient is calling to say that the samples of januvia are working and would like to know if we can call in rx for this if possible to the [facility] on [address]  [phone]

## 2014-01-23 NOTE — Telephone Encounter (Signed)
Med sent to pharm 

## 2014-02-01 ENCOUNTER — Ambulatory Visit (HOSPITAL_COMMUNITY): Payer: Medicare Other

## 2014-02-02 ENCOUNTER — Encounter (HOSPITAL_COMMUNITY): Payer: Self-pay | Admitting: Cardiovascular Disease

## 2014-03-07 ENCOUNTER — Ambulatory Visit: Payer: Self-pay | Admitting: Neurology

## 2014-03-17 ENCOUNTER — Other Ambulatory Visit: Payer: Self-pay | Admitting: Cardiology

## 2014-03-28 ENCOUNTER — Encounter: Payer: Self-pay | Admitting: Family Medicine

## 2014-03-28 ENCOUNTER — Ambulatory Visit (INDEPENDENT_AMBULATORY_CARE_PROVIDER_SITE_OTHER): Payer: Managed Care, Other (non HMO) | Admitting: Family Medicine

## 2014-03-28 VITALS — BP 132/70 | HR 60 | Temp 98.2°F | Resp 18 | Ht 70.0 in | Wt 262.0 lb

## 2014-03-28 DIAGNOSIS — R5383 Other fatigue: Secondary | ICD-10-CM

## 2014-03-28 DIAGNOSIS — E119 Type 2 diabetes mellitus without complications: Secondary | ICD-10-CM

## 2014-03-28 DIAGNOSIS — Z794 Long term (current) use of insulin: Secondary | ICD-10-CM

## 2014-03-28 DIAGNOSIS — E785 Hyperlipidemia, unspecified: Secondary | ICD-10-CM

## 2014-03-28 DIAGNOSIS — I10 Essential (primary) hypertension: Secondary | ICD-10-CM

## 2014-03-28 DIAGNOSIS — I499 Cardiac arrhythmia, unspecified: Secondary | ICD-10-CM

## 2014-03-28 DIAGNOSIS — Z23 Encounter for immunization: Secondary | ICD-10-CM

## 2014-03-28 LAB — COMPLETE METABOLIC PANEL WITH GFR
ALT: 13 U/L (ref 0–53)
AST: 15 U/L (ref 0–37)
Albumin: 3.8 g/dL (ref 3.5–5.2)
Alkaline Phosphatase: 65 U/L (ref 39–117)
BUN: 19 mg/dL (ref 6–23)
CO2: 28 mEq/L (ref 19–32)
Calcium: 9.1 mg/dL (ref 8.4–10.5)
Chloride: 100 mEq/L (ref 96–112)
Creat: 1.26 mg/dL (ref 0.50–1.35)
GFR, Est African American: 68 mL/min
GFR, Est Non African American: 59 mL/min — ABNORMAL LOW
Glucose, Bld: 111 mg/dL — ABNORMAL HIGH (ref 70–99)
Potassium: 4 mEq/L (ref 3.5–5.3)
Sodium: 139 mEq/L (ref 135–145)
Total Bilirubin: 1 mg/dL (ref 0.2–1.2)
Total Protein: 7.1 g/dL (ref 6.0–8.3)

## 2014-03-28 LAB — CBC WITH DIFFERENTIAL/PLATELET
Basophils Absolute: 0 10*3/uL (ref 0.0–0.1)
Basophils Relative: 0 % (ref 0–1)
Eosinophils Absolute: 0.4 10*3/uL (ref 0.0–0.7)
Eosinophils Relative: 5 % (ref 0–5)
HCT: 56.2 % — ABNORMAL HIGH (ref 39.0–52.0)
Hemoglobin: 18.6 g/dL — ABNORMAL HIGH (ref 13.0–17.0)
Lymphocytes Relative: 25 % (ref 12–46)
Lymphs Abs: 2 10*3/uL (ref 0.7–4.0)
MCH: 30.9 pg (ref 26.0–34.0)
MCHC: 33.1 g/dL (ref 30.0–36.0)
MCV: 93.4 fL (ref 78.0–100.0)
MPV: 10.9 fL (ref 8.6–12.4)
Monocytes Absolute: 0.9 10*3/uL (ref 0.1–1.0)
Monocytes Relative: 11 % (ref 3–12)
Neutro Abs: 4.6 10*3/uL (ref 1.7–7.7)
Neutrophils Relative %: 59 % (ref 43–77)
Platelets: 232 10*3/uL (ref 150–400)
RBC: 6.02 MIL/uL — ABNORMAL HIGH (ref 4.22–5.81)
RDW: 14.9 % (ref 11.5–15.5)
WBC: 7.8 10*3/uL (ref 4.0–10.5)

## 2014-03-28 LAB — LIPID PANEL
Cholesterol: 118 mg/dL (ref 0–200)
HDL: 34 mg/dL — ABNORMAL LOW (ref >39)
LDL Cholesterol: 62 mg/dL (ref 0–99)
Total CHOL/HDL Ratio: 3.5 Ratio
Triglycerides: 112 mg/dL (ref <150)
VLDL: 22 mg/dL (ref 0–40)

## 2014-03-28 LAB — HEMOGLOBIN A1C
Hgb A1c MFr Bld: 7.6 % — ABNORMAL HIGH (ref <5.7)
Mean Plasma Glucose: 171 mg/dL — ABNORMAL HIGH (ref <117)

## 2014-03-28 LAB — TSH: TSH: 0.931 u[IU]/mL (ref 0.350–4.500)

## 2014-03-28 NOTE — Progress Notes (Signed)
Subjective:    Patient ID: James Mcclain, male    DOB: Nov 26, 1945, 69 y.o.   MRN: [id number]  HPI Patient is here today for a recheck of his chronic medical problems. He has insulin-dependent diabetes mellitus type 2. He states his fasting blood sugars are typically around 100. He denies any hypoglycemic episodes. He is not checking his sugars after dinner. However he has not seen any sugars greater than 150. He denies any polyuria, polydipsia, or blurred vision. Diabetic foot exam is normal. Diabetic eye exam is scheduled for later this month.  Patient denies any chest pain or shortness of breath or dyspnea on exertion. However he does complain of severe dizziness. His dizziness is orthostatic in nature. He also complains of fatigue. Patient states he has no energy. He also complains of poor libido and erectile dysfunction. However the patient will be a poor testosterone replacement candidate due to his history of prostate cancer. On examination today however the patient has an irregularly irregular heart rhythm Past Medical History  Diagnosis Date  . Arthritis   . Cholesterol serum elevated   . Hypertension   . Elevated PSA   . Coronary artery disease 08/05/2012    s/p Xience DES to the LAD  . Heart murmur   . Shortness of breath   . Prostate cancer 07/15/12  . Diabetes mellitus     x 8 years  . Stroke     Cryptogenic, 09/2013, event monitor with NSR   Past Surgical History  Procedure Laterality Date  . Kidney stones      2006  . Tonsillectomy      as adult  . Total knee arthroplasty  01/24/2011    Procedure: TOTAL KNEE ARTHROPLASTY;  Surgeon: Alta Corning;  Location: [place];  Service: Orthopedics;  Laterality: Left;  COMPUTER ASSISTED LEFT  TOTAL KNEE REPLACEMENT. Anesthesia a combination of regional and general.  . Coronary angioplasty with stent placement  08/05/2012    LAD, 1 stent  . Prostate biopsy  07/15/2012  . Tee without cardioversion N/A 09/27/2013    Procedure:  TRANSESOPHAGEAL ECHOCARDIOGRAM (TEE);  Surgeon: Larey Dresser, MD;  Location: [facility] ENDOSCOPY;  Service: Cardiovascular;  Laterality: N/A;  . Percutaneous coronary stent intervention (pci-s) N/A 08/05/2012    Procedure: PERCUTANEOUS CORONARY STENT INTERVENTION (PCI-S);  Surgeon: Sherren Mocha, MD;  [facility] CATH LAB;  Service: Cardiovascular;  Laterality: N/A;   Current Outpatient Prescriptions on File Prior to Visit  Medication Sig Dispense Refill  . allopurinol (ZYLOPRIM) 100 MG tablet TAKE TWO TABLETS BY MOUTH ONCE DAILY 30 tablet 11  . amLODipine (NORVASC) 10 MG tablet Take 10 mg by mouth daily.    [name] aspirin EC 81 MG tablet Take 1 tablet (81 mg total) by mouth daily.    [name] atorvastatin (LIPITOR) 40 MG tablet Take 1 tablet (40 mg total) by mouth daily. 90 tablet 1  . carvedilol (COREG) 12.5 MG tablet TAKE ONE TABLET BY MOUTH TWICE DAILY 60 tablet 5  . Cholecalciferol (VITAMIN D3) 5000 UNITS CAPS Take 10,000 Units by mouth daily.    . clopidogrel (PLAVIX) 75 MG tablet TAKE ONE TABLET BY MOUTH ONCE DAILY WITH BREAKFAST 30 tablet 6  . insulin glargine (LANTUS) 100 units/mL SOLN Inject 35 Units into the skin at bedtime.     [name] losartan-hydrochlorothiazide (HYZAAR) 100-25 MG per tablet TAKE ONE TABLET BY MOUTH ONCE DAILY 90 tablet 1  . sitaGLIPtin (JANUVIA) 100 MG tablet Take 1 tablet (100 mg total) by  mouth daily. 30 tablet 2   No current facility-administered medications on file prior to visit.   Allergies  Allergen Reactions  . Fish Allergy Swelling   History   Social History  . Marital Status: Married    Spouse Name: N/A    Number of Children: 3  . Years of Education: 12th   Occupational History  . retired    Social History Main Topics  . Smoking status: Never Smoker   . Smokeless tobacco: Never Used  . Alcohol Use: No  . Drug Use: No  . Sexual Activity: Not Currently   Other Topics Concern  . Not on file   Social History Narrative   [name]    Patient is right handed   Patient drinks coffee and sodas      Review of Systems  All other systems reviewed and are negative.      Objective:   Physical Exam  Constitutional: He appears well-developed and well-nourished. No distress.  HENT:  Nose: Nose normal.  Mouth/Throat: Oropharynx is clear and moist. No oropharyngeal exudate.  Eyes: Conjunctivae are normal. No scleral icterus.  Neck: Neck supple. No JVD present. No thyromegaly present.  Cardiovascular: Normal rate, normal heart sounds and intact distal pulses.  An irregularly irregular rhythm present. Exam reveals no gallop and no friction rub.   No murmur heard. Pulmonary/Chest: Effort normal and breath sounds normal. No respiratory distress. He has no wheezes. He has no rales.  Abdominal: Soft. Bowel sounds are normal. He exhibits no distension and no mass. There is no tenderness. There is no rebound and no guarding.  Musculoskeletal: He exhibits no edema.  Lymphadenopathy:    He has no cervical adenopathy.  Skin: He is not diaphoretic.  Vitals reviewed.         Assessment & Plan:  HLD (hyperlipidemia) - Plan: Lipid panel  Benign essential HTN - Plan: COMPLETE METABOLIC PANEL WITH GFR  Diabetes mellitus, type II, insulin dependent - Plan: Hemoglobin A1c, Microalbumin, urine  Other fatigue - Plan: TSH, Testosterone, CBC with Differential/Platelet  Irregular heart rate - Plan: EKG 12-Lead  Thankfully his EKG shows normal sinus rhythm with frequent PVCs. He does have an old inferior infarct which is chronic. I will check a CBC as well as a TSH and a testosterone to evaluate the patient's fatigue. I will check a hemoglobin A1c and a urine microalbumin to evaluate his diabetes. Diabetic foot exam and eye exam are up-to-date. I will check a fasting lipid panel. Goal LDL cholesterol is less than 70. Blood pressures well controlled. I will also give the patient Prevnar 13 today.

## 2014-03-28 NOTE — Addendum Note (Signed)
Addended by: Shary Decamp B on: 03/28/2014 12:00 PM   Modules accepted: Orders

## 2014-03-29 LAB — MICROALBUMIN, URINE: Microalb, Ur: 68.7 mg/dL — ABNORMAL HIGH (ref <2.0)

## 2014-03-29 LAB — TESTOSTERONE: Testosterone: 240 ng/dL — ABNORMAL LOW (ref 300–890)

## 2014-04-04 ENCOUNTER — Other Ambulatory Visit: Payer: Self-pay | Admitting: Family Medicine

## 2014-04-04 DIAGNOSIS — D582 Other hemoglobinopathies: Secondary | ICD-10-CM

## 2014-04-06 ENCOUNTER — Encounter: Payer: Medicare Other | Admitting: Neurology

## 2014-04-06 ENCOUNTER — Other Ambulatory Visit: Payer: Managed Care, Other (non HMO)

## 2014-04-06 DIAGNOSIS — D582 Other hemoglobinopathies: Secondary | ICD-10-CM

## 2014-04-07 ENCOUNTER — Encounter: Payer: Self-pay | Admitting: Neurology

## 2014-04-07 ENCOUNTER — Telehealth: Payer: Self-pay | Admitting: Neurology

## 2014-04-07 NOTE — Telephone Encounter (Signed)
I have attempted without success to contact this patient by phone to his home phone.

## 2014-04-09 LAB — JAK2 V617F, RFLX EXONS 12 AND 13: JAK2 V617F: DETECTED

## 2014-04-12 ENCOUNTER — Other Ambulatory Visit: Payer: Self-pay | Admitting: Family Medicine

## 2014-04-12 DIAGNOSIS — D751 Secondary polycythemia: Secondary | ICD-10-CM

## 2014-04-13 ENCOUNTER — Telehealth: Payer: Self-pay | Admitting: Neurology

## 2014-04-13 NOTE — Telephone Encounter (Signed)
I have attempted without success to contact this patient by phone to [phone]

## 2014-04-14 ENCOUNTER — Telehealth: Payer: Self-pay | Admitting: Neurology

## 2014-04-14 NOTE — Telephone Encounter (Signed)
I have attempted without success to contact this patient by phone.I left a message for the patient to return my call.

## 2014-04-17 ENCOUNTER — Telehealth: Payer: Self-pay | Admitting: Oncology

## 2014-04-17 ENCOUNTER — Ambulatory Visit: Payer: Managed Care, Other (non HMO) | Admitting: Family Medicine

## 2014-04-17 ENCOUNTER — Telehealth: Payer: Self-pay | Admitting: Neurology

## 2014-04-17 NOTE — Telephone Encounter (Signed)
pt confirmed appt for 05/16/14 at 10:30 w/ Alen Blew Dx:  Polycythemia Referring [facility]

## 2014-04-17 NOTE — Telephone Encounter (Signed)
I left a message for the patient to return my call.

## 2014-04-18 ENCOUNTER — Telehealth: Payer: Self-pay | Admitting: Neurology

## 2014-04-18 NOTE — Telephone Encounter (Signed)
I left a message for the patient to return my call.

## 2014-04-19 ENCOUNTER — Telehealth: Payer: Self-pay | Admitting: Oncology

## 2014-04-19 NOTE — Telephone Encounter (Signed)
Chart delivered. TG 04/19/12

## 2014-04-20 ENCOUNTER — Telehealth: Payer: Self-pay | Admitting: Neurology

## 2014-04-20 NOTE — Telephone Encounter (Signed)
I left a message for the patient to return my call.

## 2014-04-21 ENCOUNTER — Encounter: Payer: Self-pay | Admitting: Neurology

## 2014-04-25 ENCOUNTER — Telehealth: Payer: Self-pay | Admitting: Cardiology

## 2014-04-25 NOTE — Telephone Encounter (Signed)
ERROR

## 2014-04-28 ENCOUNTER — Telehealth: Payer: Self-pay | Admitting: Neurology

## 2014-04-28 ENCOUNTER — Encounter: Payer: Self-pay | Admitting: Neurology

## 2014-04-28 NOTE — Telephone Encounter (Signed)
I have attempted without success to contact this patient. I left a message for the patient to return my call.

## 2014-05-05 ENCOUNTER — Telehealth: Payer: Self-pay | Admitting: Neurology

## 2014-05-05 NOTE — Telephone Encounter (Signed)
I have attempted without success to contact this patient by phone.I left a message for the patient to return my call.

## 2014-05-16 ENCOUNTER — Ambulatory Visit: Payer: Medicare HMO

## 2014-05-16 ENCOUNTER — Ambulatory Visit (HOSPITAL_BASED_OUTPATIENT_CLINIC_OR_DEPARTMENT_OTHER): Payer: Medicare HMO

## 2014-05-16 ENCOUNTER — Other Ambulatory Visit: Payer: Self-pay

## 2014-05-16 ENCOUNTER — Ambulatory Visit (HOSPITAL_BASED_OUTPATIENT_CLINIC_OR_DEPARTMENT_OTHER): Payer: Medicare HMO | Admitting: Oncology

## 2014-05-16 ENCOUNTER — Telehealth: Payer: Self-pay | Admitting: Oncology

## 2014-05-16 ENCOUNTER — Encounter: Payer: Self-pay | Admitting: Oncology

## 2014-05-16 VITALS — BP 168/81 | HR 78 | Temp 98.3°F | Resp 19 | Ht 70.0 in | Wt 269.9 lb

## 2014-05-16 DIAGNOSIS — E119 Type 2 diabetes mellitus without complications: Secondary | ICD-10-CM | POA: Diagnosis not present

## 2014-05-16 DIAGNOSIS — I10 Essential (primary) hypertension: Secondary | ICD-10-CM | POA: Diagnosis not present

## 2014-05-16 DIAGNOSIS — D751 Secondary polycythemia: Secondary | ICD-10-CM | POA: Diagnosis not present

## 2014-05-16 DIAGNOSIS — Z8673 Personal history of transient ischemic attack (TIA), and cerebral infarction without residual deficits: Secondary | ICD-10-CM

## 2014-05-16 DIAGNOSIS — E785 Hyperlipidemia, unspecified: Secondary | ICD-10-CM | POA: Diagnosis not present

## 2014-05-16 LAB — CBC WITH DIFFERENTIAL/PLATELET
BASO%: 0.4 % (ref 0.0–2.0)
Basophils Absolute: 0 10*3/uL (ref 0.0–0.1)
EOS%: 3.8 % (ref 0.0–7.0)
Eosinophils Absolute: 0.3 10*3/uL (ref 0.0–0.5)
HCT: 58.3 % — ABNORMAL HIGH (ref 38.4–49.9)
HGB: 18.6 g/dL — ABNORMAL HIGH (ref 13.0–17.1)
LYMPH%: 27.2 % (ref 14.0–49.0)
MCH: 31.2 pg (ref 27.2–33.4)
MCHC: 31.9 g/dL — ABNORMAL LOW (ref 32.0–36.0)
MCV: 97.8 fL (ref 79.3–98.0)
MONO#: 0.8 10*3/uL (ref 0.1–0.9)
MONO%: 9.4 % (ref 0.0–14.0)
NEUT#: 5 10*3/uL (ref 1.5–6.5)
NEUT%: 59.2 % (ref 39.0–75.0)
Platelets: 241 10*3/uL (ref 140–400)
RBC: 5.96 10*6/uL — ABNORMAL HIGH (ref 4.20–5.82)
RDW: 15.5 % — ABNORMAL HIGH (ref 11.0–14.6)
WBC: 8.5 10*3/uL (ref 4.0–10.3)
lymph#: 2.3 10*3/uL (ref 0.9–3.3)

## 2014-05-16 NOTE — Patient Instructions (Signed)

## 2014-05-16 NOTE — Progress Notes (Signed)
Phlebotomy performed. 525 grams taken out of (R)AC. Patient observed after phlebotomy and discharged in stable condition. Drinks/Snacks provided before and after chemotherapy.

## 2014-05-16 NOTE — Consult Note (Signed)
Reason for Referral: Polycythemia.   HPI: 69 year old gentleman currently of [place] where he lived the majority of his life. He does have a history of hypertension, hyperlipidemia as well as diabetes. He presented on 09/23/2013 with sudden onset of right arm and leg weakness and was diagnosed with an acute stroke affecting left frontal lobe. He received TPA with significant improvement in his symptoms. Since that time he was placed on aspirin and Plavix. At that time his hemoglobin on presentation was 20.1. Subsequently his hemoglobin drifted down to 16.9 and upon discharge was 17.3. Most recently his hemoglobin was up to 18.6 and a hematocrit of 56.2. His white cell count was normal 7.8 with a normal platelet. He did have a JAK2 mutation detected on 04/06/2014. Patient referred to me for evaluation regarding these findings. He denied any recent neurological episodes after his stroke in July 2015. He does not report any bleeding such as epistaxis hematochezia or melena. He does not report any toxic fumes exposure to carbon monoxide exposure. He was not known to have sleep apnea although he does report occasional snoring. He is not currently on diuretics. He does report occasional headaches and blurry vision. He did not have any syncope or seizures. He does report fatigue and tiredness and occasional dyspnea on exertion. But is able to perform all activities of daily living. He does not report any chest pain, palpitation, orthopnea. He does not report any cough, hemoptysis, wheezing or shortness of breath. He does not report any nausea, vomiting, abdominal pain, malaise satiety. He does not report any constipation, diarrhea, hematochezia or melena. He does not report any frequency urgency or hesitancy. He does not report any skeletal complaints. Rest of his review of systems unremarkable.   Past Medical History  Diagnosis Date  . Arthritis   . Cholesterol serum elevated   . Hypertension   . Elevated  PSA   . Coronary artery disease 08/05/2012    s/p Xience DES to the LAD  . Heart murmur   . Shortness of breath   . Prostate cancer 07/15/12  . Diabetes mellitus     x 8 years  . Stroke     Cryptogenic, 09/2013, event monitor with NSR  :  Past Surgical History  Procedure Laterality Date  . Kidney stones      2006  . Tonsillectomy      as adult  . Total knee arthroplasty  01/24/2011    Procedure: TOTAL KNEE ARTHROPLASTY;  Surgeon: Alta Corning;  Location: [place];  Service: Orthopedics;  Laterality: Left;  COMPUTER ASSISTED LEFT  TOTAL KNEE REPLACEMENT. Anesthesia a combination of regional and general.  . Coronary angioplasty with stent placement  08/05/2012    LAD, 1 stent  . Prostate biopsy  07/15/2012  . Tee without cardioversion N/A 09/27/2013    Procedure: TRANSESOPHAGEAL ECHOCARDIOGRAM (TEE);  Surgeon: Larey Dresser, MD;  Location: [facility] ENDOSCOPY;  Service: Cardiovascular;  Laterality: N/A;  . Percutaneous coronary stent intervention (pci-s) N/A 08/05/2012    Procedure: PERCUTANEOUS CORONARY STENT INTERVENTION (PCI-S);  Surgeon: Sherren Mocha, MD;  Location: [facility] CATH LAB;  Service: Cardiovascular;  Laterality: N/A;  :   Current outpatient prescriptions:  .  allopurinol (ZYLOPRIM) 100 MG tablet, TAKE TWO TABLETS BY MOUTH ONCE DAILY, Disp: 30 tablet, Rfl: 11 .  amLODipine (NORVASC) 10 MG tablet, Take 10 mg by mouth daily., Disp: , Rfl:  .  aspirin EC 81 MG tablet, Take 1 tablet (81 mg total) by mouth daily., Disp: ,  Rfl:  .  atorvastatin (LIPITOR) 40 MG tablet, Take 1 tablet (40 mg total) by mouth daily., Disp: 90 tablet, Rfl: 1 .  carvedilol (COREG) 12.5 MG tablet, TAKE ONE TABLET BY MOUTH TWICE DAILY, Disp: 60 tablet, Rfl: 5 .  Cholecalciferol (VITAMIN D3) 5000 UNITS CAPS, Take 10,000 Units by mouth daily., Disp: , Rfl:  .  clopidogrel (PLAVIX) 75 MG tablet, TAKE ONE TABLET BY MOUTH ONCE DAILY WITH BREAKFAST, Disp: 30 tablet, Rfl: 6 .  insulin glargine (LANTUS) 100 units/mL SOLN,  Inject 35 Units into the skin at bedtime. , Disp: , Rfl:  .  losartan-hydrochlorothiazide (HYZAAR) 100-25 MG per tablet, TAKE ONE TABLET BY MOUTH ONCE DAILY, Disp: 90 tablet, Rfl: 1 .  metFORMIN (GLUCOPHAGE) 1000 MG tablet, 1 tab po qam & 1/2 tab po qpm, Disp: , Rfl:  .  vitamin E 1000 UNIT capsule, Take 1,000 Units by mouth daily., Disp: , Rfl: :  Allergies  Allergen Reactions  . Fish Allergy Swelling  :  Family History  Problem Relation Age of Onset  . Leukemia Mother   . Heart Problems Father   . Heart Problems      family history  . Diabetes Sister   . Heart Problems Brother   . Heart attack Neg Hx   . Stroke    . Stroke Brother   . Hypertension Brother   :  History   Social History  . Marital Status: Married    Spouse Name: N/A  . Number of Children: 3  . Years of Education: 12th   Occupational History  . retired    Social History Main Topics  . Smoking status: Never Smoker   . Smokeless tobacco: Never Used  . Alcohol Use: No  . Drug Use: No  . Sexual Activity: Not Currently   Other Topics Concern  . Not on file   Social History Narrative   [name]   Patient is right handed   Patient drinks coffee and sodas  :  Pertinent items are noted in HPI.  Exam: Blood pressure 168/81, pulse 78, temperature 98.3 F (36.8 C), temperature source Oral, resp. rate 19, height 5\' 10"  (1.778 m), weight 269 lb 14.4 oz (122.426 kg), SpO2 100 %. General appearance: alert and cooperative Head: Normocephalic, without obvious abnormality Throat: lips, mucosa, and tongue normal; teeth and gums normal Neck: no adenopathy Back: negative Resp: clear to auscultation bilaterally Chest wall: no tenderness Cardio: regular rate and rhythm, S1, S2 normal, no murmur, click, rub or gallop GI: soft, non-tender; bowel sounds normal; no masses,  no organomegaly Extremities: extremities normal, atraumatic, no cyanosis or edema Pulses: 2+ and symmetric Skin:  Skin color, texture, turgor normal. No rashes or lesions Lymph nodes: Cervical, supraclavicular, and axillary nodes normal.  CBC    Component Value Date/Time   WBC 7.8 03/28/2014 1013   RBC 6.02* 03/28/2014 1013   HGB 18.6* 03/28/2014 1013   HCT 56.2* 03/28/2014 1013   PLT 232 03/28/2014 1013   MCV 93.4 03/28/2014 1013   MCH 30.9 03/28/2014 1013   MCHC 33.1 03/28/2014 1013   RDW 14.9 03/28/2014 1013   LYMPHSABS 2.0 03/28/2014 1013   MONOABS 0.9 03/28/2014 1013   EOSABS 0.4 03/28/2014 1013   BASOSABS 0.0 03/28/2014 1013   JAK2 [id number] DETECTED          Assessment and Plan:   69 year old gentleman with the following issues:  1. Polycythemia: Differential diagnosis was discussed with the patient  and his wife. Primary causes such as polycythemia vera should be a consideration at this time. He has a positive JAK2 mutation detected with persistently elevated hemoglobin. He did have an acute thrombotic event although it is unclear to me whether this event was precipitated by his hypertension and hyperlipidemia among other risk factors.   Secondary causes would be also consideration such as sleep apnea dehydration smoke exposure among others. 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  A management standpoint, I feel therapeutic phlebotomy would be helpful in his case given his risk of arterial thrombosis. I will schedule him phlebotomy on a monthly basis to keep his hematocrit below 50. Even if he has secondary polycythemia, I see no harm of this procedure at this time. Complications include syncope, lightheadedness, fatigue were discussed and he is agreeable to proceed.  2. Thrombosis prophylaxis: He is already on aspirin and Plavix and should be well protected.  3. Follow-up: Will be in 4 months to assess his response to phlebotomies.

## 2014-05-16 NOTE — Progress Notes (Signed)
Please see consult note.  

## 2014-05-16 NOTE — Telephone Encounter (Signed)
gave and printed appt sched and avs for pt for April thru July

## 2014-05-16 NOTE — Progress Notes (Signed)
Checked in new pt with no financial concerns at this time.  Pt states he's here for a hematology concern so financial assistance may not be needed but he has my card for any billing questions or concerns.

## 2014-05-17 ENCOUNTER — Telehealth: Payer: Self-pay | Admitting: Neurology

## 2014-05-17 ENCOUNTER — Ambulatory Visit (INDEPENDENT_AMBULATORY_CARE_PROVIDER_SITE_OTHER): Payer: Medicare HMO | Admitting: Physician Assistant

## 2014-05-17 ENCOUNTER — Ambulatory Visit
Admission: RE | Admit: 2014-05-17 | Discharge: 2014-05-17 | Disposition: A | Discharge status: Home / Self Care | Payer: Medicare HMO | Source: Ambulatory Visit | Attending: Physician Assistant | Admitting: Physician Assistant

## 2014-05-17 ENCOUNTER — Encounter: Payer: Self-pay | Admitting: Physician Assistant

## 2014-05-17 ENCOUNTER — Telehealth: Payer: Self-pay | Admitting: *Deleted

## 2014-05-17 VITALS — BP 120/70 | HR 86 | Ht 70.0 in | Wt 268.8 lb

## 2014-05-17 DIAGNOSIS — I639 Cerebral infarction, unspecified: Secondary | ICD-10-CM

## 2014-05-17 DIAGNOSIS — R0600 Dyspnea, unspecified: Secondary | ICD-10-CM

## 2014-05-17 DIAGNOSIS — I77810 Thoracic aortic ectasia: Secondary | ICD-10-CM | POA: Insufficient documentation

## 2014-05-17 DIAGNOSIS — Z9861 Coronary angioplasty status: Secondary | ICD-10-CM | POA: Diagnosis not present

## 2014-05-17 DIAGNOSIS — I493 Ventricular premature depolarization: Secondary | ICD-10-CM

## 2014-05-17 DIAGNOSIS — I10 Essential (primary) hypertension: Secondary | ICD-10-CM

## 2014-05-17 DIAGNOSIS — I2510 Atherosclerotic heart disease of native coronary artery without angina pectoris: Secondary | ICD-10-CM | POA: Diagnosis not present

## 2014-05-17 DIAGNOSIS — E119 Type 2 diabetes mellitus without complications: Secondary | ICD-10-CM

## 2014-05-17 DIAGNOSIS — E785 Hyperlipidemia, unspecified: Secondary | ICD-10-CM

## 2014-05-17 DIAGNOSIS — I471 Supraventricular tachycardia: Secondary | ICD-10-CM | POA: Insufficient documentation

## 2014-05-17 DIAGNOSIS — IMO0001 Reserved for inherently not codable concepts without codable children: Secondary | ICD-10-CM | POA: Insufficient documentation

## 2014-05-17 DIAGNOSIS — R0609 Other forms of dyspnea: Secondary | ICD-10-CM

## 2014-05-17 DIAGNOSIS — Q2112 Patent foramen ovale: Secondary | ICD-10-CM | POA: Insufficient documentation

## 2014-05-17 DIAGNOSIS — Q211 Atrial septal defect: Secondary | ICD-10-CM | POA: Insufficient documentation

## 2014-05-17 DIAGNOSIS — Z794 Long term (current) use of insulin: Secondary | ICD-10-CM

## 2014-05-17 LAB — BASIC METABOLIC PANEL
BUN: 22 mg/dL (ref 6–23)
CO2: 31 mEq/L (ref 19–32)
Calcium: 9 mg/dL (ref 8.4–10.5)
Chloride: 102 mEq/L (ref 96–112)
Creatinine, Ser: 1.36 mg/dL (ref 0.40–1.50)
GFR: 67.09 mL/min (ref >60.00)
Glucose, Bld: 213 mg/dL — ABNORMAL HIGH (ref 70–99)
Potassium: 3.9 mEq/L (ref 3.5–5.1)
Sodium: 139 mEq/L (ref 135–145)

## 2014-05-17 LAB — D-DIMER, QUANTITATIVE (NOT AT ARMC): D-Dimer, Quant: 0.34 ug/mL-FEU (ref 0.00–0.48)

## 2014-05-17 LAB — PROTIME-INR
INR: 1 ratio (ref 0.8–1.0)
Prothrombin Time: 11.4 s (ref 9.6–13.1)

## 2014-05-17 LAB — MAGNESIUM: Magnesium: 1.7 mg/dL (ref 1.5–2.5)

## 2014-05-17 MED ORDER — MAGNESIUM OXIDE 400 MG PO TABS: 400.0000 mg | ORAL_TABLET | Freq: Every day | ORAL | Status: DC

## 2014-05-17 NOTE — Progress Notes (Addendum)
Cardiology Office Note Date:  05/17/2014  Patient ID:  James Mcclain, James Mcclain [birth year], MRN [id number] PCP:  Odette Fraction, MD  Cardiologist:  Aundra Dubin Electrophysiologist: Lovena Le   Chief Complaint: dyspnea; follow-up of CAD, prior stroke  History of Present Illness: James Mcclain is a 69 y.o. male with history of CAD s/p DES to the proximal LAD in 07/2012, IDDM, HTN, CKD, prostate CA, PVCs, cryptogenic stroke 09/2013, MAT on telemetry 09/2013, PFO, polycythema s/p recent phlebotomy who presents for 6 month follow-up of CAD/prior stroke. Per review of chart he had stroke in 09/2013 with acute cortical infarct involving the left parietal lobe s/p IV tPA.He was noted to have acute/subacute infarcts in the posterior left frontal lobe and superior left temporal lobe.Echo (8/15): Mild LVH, EF 76%, grade 1 diastolic dysfunction, mildly dilated aortic root and moderately dilated ascending aorta (aortic root 41 mm, ascending aorta 44 mm), mild LAE, normal RV function, trivial effusion, small PFO - negative LE duplex. He was seen by EP given cryptogenic stroke. He was noted to have MAT on tele and thought to be high risk for atrial fib.Outpatient event monitor was arranged and showed only NSR. Dr. Lovena Le saw the patient in the hospital and planned to see him back in follow-up to discuss ILR (if his event monitor was unremarkable). He was seen in follow-up by Richardson Dopp PA-C 12/2013 who referred him back to Dr. Lovena Le to discuss this but it does not appear this appointment ever occurred. MRA chest is planned per Dr. Aundra Dubin 12/2014 to assess mildly dilated aortic root. Recent labs 03/2014 showed LDL 62, Cr 1.26, normal LFTs.   He presents for 6 month follow-up a little early to discuss recent recurrence of DOE. He had the flu about 3 weeks ago. About 4 weeks ago he noticed worsening DOE, such as walking to [employer]. It is resolved with rest. He denies any chest pain, nausea, vomiting, syncope, diaphoresis or  bleeding. POx 93-94% with ambulation in the office today with HR increase to 120s (80s-90s at baseline). Per review of chart at time of PCI in 2014 his presenting symptom was exertional dyspnea (with preceding nuc showing primarily fixed inferior wall defect, poor exercise tolerance, EF 56% - he went on to have cath due to severe persistent dyspnea despite amlodipine/Coreg use). No recent travel, surgery, bedrest, LEE, fevers, chills, orthopnea. No dyspnea at rest. Weight generally fluctuates a few pounds at baseline. He has had a nonproductive cough. Of note his wife is quite ill at [facility] with flu, PNA, vent->trache, which has been very stressful for the patient.   Studies: - LHC (6/14): Proximal LAD 90%, proximal ramus 30%, mid circumflex 30%, mid RCA 40% >> PCI: 3 x 33 mm Xience DES to the proximal LAD - Echo (8/15): Mild LVH, EF 22%, grade 1 diastolic dysfunction, mildly dilated aortic root and moderately dilated ascending aorta (aortic root 41 mm, ascending aorta 44 mm), mild LAE, normal RV function, trivial effusion - TEE (6/14): Mild LVH, EF 60%, normal RV size and function, grade 4 plaque in the descending aorta, grade 3 plaque in the arch, very small PFO - TEE (8/15): Mild LVH, EF 55-60%, ascending aorta dilated 4.4 cm, No LAA clot, mild MR, small PFO by bubble study, grade 2 plaque in the descending thoracic aorta (venous US negative for DVT bilaterally) - Nuclear (5/14): Primarily fixed inferior wall defect, poor exercise tolerance, EF 56% - Event Monitor (10/15): NSR, no arrhythmias   Past Medical History  Diagnosis  Date  . Arthritis   . Essential hypertension   . Elevated PSA   . Coronary artery disease     a. s/p Xience DES to the LAD 07/2012.  . Prostate cancer 07/15/12  . Insulin dependent diabetes mellitus   . Stroke     a. Cryptogenic, 09/2013, event monitor with NSR. Small PFO noted on echo but neg LE duplex.  [name] PVC's (premature ventricular contractions)   . PFO (patent  foramen ovale)     a. By echo 09/2013.  . Dilated aortic root     a. Mildly dilated by echo 09/2013, f/u MRA scheduled for 12/2014.  [name] Hyperlipidemia   . Multifocal atrial tachycardia     a. On tele 09/2013.    Past Surgical History  Procedure Laterality Date  . Kidney stones      2006  . Tonsillectomy      as adult  . Total knee arthroplasty  01/24/2011    Procedure: TOTAL KNEE ARTHROPLASTY;  Surgeon: Alta Corning;  Location: [facility];  Service: Orthopedics;  Laterality: Left;  COMPUTER ASSISTED LEFT  TOTAL KNEE REPLACEMENT. Anesthesia a combination of regional and general.  . Coronary angioplasty with stent placement  08/05/2012    LAD, 1 stent  . Prostate biopsy  07/15/2012  . Tee without cardioversion N/A 09/27/2013    Procedure: TRANSESOPHAGEAL ECHOCARDIOGRAM (TEE);  Surgeon: Larey Dresser, MD;  Location: [facility] ENDOSCOPY;  Service: Cardiovascular;  Laterality: N/A;  . Percutaneous coronary stent intervention (pci-s) N/A 08/05/2012    Procedure: PERCUTANEOUS CORONARY STENT INTERVENTION (PCI-S);  Surgeon: Sherren Mocha, MD;  Location: [facility] CATH LAB;  Service: Cardiovascular;  Laterality: N/A;    Current Outpatient Prescriptions  Medication Sig Dispense Refill  . allopurinol (ZYLOPRIM) 100 MG tablet TAKE TWO TABLETS BY MOUTH ONCE DAILY 30 tablet 11  . amLODipine (NORVASC) 10 MG tablet Take 10 mg by mouth daily.    [name] aspirin EC 81 MG tablet Take 1 tablet (81 mg total) by mouth daily.    [name] atorvastatin (LIPITOR) 40 MG tablet Take 1 tablet (40 mg total) by mouth daily. 90 tablet 1  . carvedilol (COREG) 12.5 MG tablet TAKE ONE TABLET BY MOUTH TWICE DAILY 60 tablet 5  . Cholecalciferol (VITAMIN D3) 5000 UNITS CAPS Take 10,000 Units by mouth daily.    . clopidogrel (PLAVIX) 75 MG tablet TAKE ONE TABLET BY MOUTH ONCE DAILY WITH BREAKFAST 30 tablet 6  . insulin glargine (LANTUS) 100 units/mL SOLN Inject 35 Units into the skin at bedtime.     [name] losartan-hydrochlorothiazide (HYZAAR) 100-25 MG per  tablet TAKE ONE TABLET BY MOUTH ONCE DAILY 90 tablet 1  . metFORMIN (GLUCOPHAGE) 1000 MG tablet 1 tab po qam & 1/2 tab po qpm    . vitamin E 1000 UNIT capsule Take 1,000 Units by mouth daily.     No current facility-administered medications for this visit.    Allergies:   Fish allergy   Social History:  The patient  reports that he has never smoked. He has never used smokeless tobacco. He reports that he does not drink alcohol or use illicit drugs.   Family History:  The patient's family history includes Diabetes in his sister; Heart Problems in his brother, father, and another family member; Hypertension in his brother; Leukemia in his mother; Stroke in his brother and another family member. There is no history of Heart attack.  ROS:  Please see the history of present illness.   All other systems are  reviewed and otherwise negative.   PHYSICAL EXAM:  VS:  BP 120/70 mmHg  Pulse 86  Ht 5\' 10"  (1.778 m)  Wt 268 lb 12.8 oz (121.927 kg)  BMI 38.57 kg/m2 BMI: Body mass index is 38.57 kg/(m^2). Well nourished, well developed M, in no acute distress HEENT: normocephalic, atraumatic Neck: no JVD Cardiac:  normal S1, S2; RRR; no murmur Lungs:  clear to auscultation bilaterally, no wheezing, rhonchi or rales Abd: soft, nontender, no hepatomegaly Ext: no edema Skin: warm and dry Neuro:  moves all extremities spontaneously, no focal abnormalities noted  EKG:  86bpm LAFB, prior inferior infarct, prior anterior infarct, poor R Wave progression, occaisonal PVC. No acute change from prior.  Recent Labs: 03/28/2014: ALT 13; BUN 19; Creatinine 1.26; Potassium 4.0; Sodium 139; TSH 0.931 05/16/2014: Hemoglobin 18.6*; Platelets 241  03/28/2014: Cholesterol, Total 118; HDL-C 34*; LDL (calc) 62; Total CHOL/HDL Ratio 3.5; Triglycerides 112; VLDL 22   CrCl cannot be calculated (Patient has no serum creatinine result on file.).   Wt Readings from Last 3 Encounters:  05/17/14 268 lb 12.8 oz (121.927 kg)    05/16/14 269 lb 14.4 oz (122.426 kg)  03/28/14 262 lb (118.842 kg)     Other studies reviewed: Additional studies/records reviewed today include: summarized above  ASSESSMENT AND PLAN:  1. Dyspnea on exertion - unclear etiology. Will check updated BMET, d-dimer, CXR. Recent TSH, CBC OK (polycythema s/p phlebotomy). D/w Dr.McLean who agrees with the above plan and also suggests updated 2D echocardiogram. If above labs/CXR unrevealing we will arrange L/RHC. Previous nuclear stress testing was not particularly helpful as he went on to have PCI anyway. Note prior h/o CKD. Will assess with BMET. The patient is aware further plans will be based on the above workup. ER precautions reviewed. ADDENDUM: BMET, D-dimer unrevealing. CXR shows possible early PNA. D/w Dr. Aundra Dubin - will treat for possible PNA with Levaquin. No evidence of vascular congestion or edema on CXR. If SOB persists despite tx for PNA, will then consider R/LHC. Have asked nursing to arrange 10 day f/u. 2. CAD s/p prior PCI - continue current regimen. While DOE is being evaluated I will continue Plavix but will defer ultimate plan for duration per Dr. Aundra Dubin. He may also be on Plavix for history of stroke but I do not have access to neurology notes at this time. 3. Cryptogenic stroke - I offered him referral directly to ILR implantation versus appointment with Dr. Lovena Le to discuss first. He prefers to see Dr. Lovena Le first and understands the EP appointments may not be immediately available. 4. Essential HTN - controlled. 5. PVCs - these are known for the patient thus it's unclear if there is any relationship to his recent dyspnea. Check lytes, Mg. ADDENDUM: Mg level a little low thus MagOx started with repeat Mg when he comes in for his echo. 6. Hyperlipidemia - followed by PCP. Controlled with recent LDL 62. 7. H/o dilated aortic root - f/u MRA chest as Dr. Aundra Dubin planned 12/2014.  Disposition: F/u with Dr. Aundra Dubin 6 weeks tentatively.  Refer to Dr. Lovena Le as above for consideration of ILR.  Current medicines are reviewed at length with the patient today.  The patient did not have any concerns regarding medicines.  Raechel Ache PA-C 05/17/2014 10:52 AM     [facility] [place] [address] [phone] (office)  [phone] (fax)

## 2014-05-17 NOTE — Patient Instructions (Addendum)
Your physician recommends that you continue on your current medications as directed. Please refer to the Current Medication list given to you today.  Lab Today: Bmet, D-Dimer, Pt/Inr, Mag  Your physician has requested that you have an echocardiogram. Echocardiography is a painless test that uses sound waves to create images of your heart. It provides your doctor with information about the size and shape of your heart and how well your heart's chambers and valves are working. This procedure takes approximately one hour. There are no restrictions for this procedure.   A chest x-ray takes a picture of the organs and structures inside the chest, including the heart, lungs, and blood vessels. This test can show several things, including, whether the heart is enlarges; whether fluid is building up in the lungs; and whether pacemaker / defibrillator leads are still in place.( [facility])  Your physician recommends that you have a MR Angio of the chest in Nov 2016  Your physician recommends that you schedule a follow-up appointment pending results

## 2014-05-17 NOTE — Telephone Encounter (Signed)
I left a message for the patient to return my call.

## 2014-05-17 NOTE — Telephone Encounter (Signed)
pt notified about lab results , neg d-dimer, mag level low start mag ox 400 mg daily, Rx sent in. Magnesium lab 05/24/14. Pt agreeable to plan of care.

## 2014-05-18 ENCOUNTER — Telehealth: Payer: Self-pay | Admitting: *Deleted

## 2014-05-18 MED ORDER — LEVOFLOXACIN 500 MG PO TABS: 500.0000 mg | ORAL_TABLET | Freq: Every day | ORAL | Status: DC

## 2014-05-18 NOTE — Telephone Encounter (Signed)
pt notified of cxr results and will start levaquin 500 mg daily x 7 days. pt scheduled today with Brynda Rim. PA to be seen in 10 days per Melina Copa, PA to have pt f/u in office w/App or Dr. Aundra Dubin.

## 2014-05-22 ENCOUNTER — Other Ambulatory Visit: Payer: Self-pay | Admitting: Family Medicine

## 2014-05-22 NOTE — Telephone Encounter (Signed)
Medication refilled per protocol. 

## 2014-05-24 ENCOUNTER — Other Ambulatory Visit (INDEPENDENT_AMBULATORY_CARE_PROVIDER_SITE_OTHER): Payer: Medicare HMO | Admitting: *Deleted

## 2014-05-24 ENCOUNTER — Other Ambulatory Visit (HOSPITAL_COMMUNITY): Payer: Self-pay | Admitting: Physician Assistant

## 2014-05-24 ENCOUNTER — Ambulatory Visit (HOSPITAL_COMMUNITY): Payer: Medicare HMO | Attending: Cardiovascular Disease | Admitting: Cardiology

## 2014-05-24 DIAGNOSIS — R0609 Other forms of dyspnea: Secondary | ICD-10-CM

## 2014-05-24 DIAGNOSIS — I493 Ventricular premature depolarization: Secondary | ICD-10-CM | POA: Diagnosis not present

## 2014-05-24 DIAGNOSIS — R0600 Dyspnea, unspecified: Secondary | ICD-10-CM

## 2014-05-24 LAB — MAGNESIUM: Magnesium: 1.7 mg/dL (ref 1.5–2.5)

## 2014-05-24 NOTE — Progress Notes (Signed)
Echo performed. 

## 2014-05-25 ENCOUNTER — Other Ambulatory Visit: Payer: Self-pay | Admitting: Family Medicine

## 2014-05-25 ENCOUNTER — Telehealth: Payer: Self-pay | Admitting: Neurology

## 2014-05-25 NOTE — Telephone Encounter (Signed)
Refill appropriate and filled per protocol. 

## 2014-05-25 NOTE — Telephone Encounter (Signed)
I left a message for the patient to return my call.

## 2014-05-26 ENCOUNTER — Telehealth: Payer: Self-pay | Admitting: *Deleted

## 2014-05-26 DIAGNOSIS — I493 Ventricular premature depolarization: Secondary | ICD-10-CM

## 2014-05-26 MED ORDER — MAGNESIUM OXIDE 400 MG PO TABS: 400.0000 mg | ORAL_TABLET | Freq: Two times a day (BID) | ORAL | Status: DC

## 2014-05-26 NOTE — Telephone Encounter (Signed)
Follow up ° ° ° ° °Returning a nurses call °

## 2014-05-26 NOTE — Telephone Encounter (Signed)
pt notified about lab and echo results. He does verify that he is taking the mag ox 400 daily. I advised pt per Melina Copa, PA that she would like for him to increase mag ox to 400 mg BID and keep f/u w/Scott W. pA 4/4. Pt said ok and thank you.

## 2014-05-26 NOTE — Telephone Encounter (Signed)
lmptcb to go over results and verify magnesium dose

## 2014-05-28 NOTE — Progress Notes (Signed)
Cardiology Office Note   Date:  05/29/2014   ID:  James, Mcclain 1945-11-23, MRN [id number]  PCP:  Odette Fraction, MD  Cardiologist:  Dr. Loralie Champagne   Electrophysiologist:  Dr. Cristopher Peru    Chief Complaint  Patient presents with  . Follow-up    Shortness of Breath     History of Present Illness: James Mcclain is a 69 y.o. male with a hx of CAD status post DES to the proximal LAD in 07/2012, diabetes, HTN, CKD, prostate CA.   Admitted in 09/2013 with an acute cortical infarct involving the left parietal lobe s/p IV tPA. Patient was noted to have acute/subacute infarcts in the posterior left frontal lobe and superior left temporal lobe. Patient was seen by EP at that time given cryptogenic stroke. He was noted to have MAT on tele and thought to be high risk for AFib. Outpatient event monitor was arranged. Dr. Lovena Le saw the patient in the hospital and planned to see him back in follow-up to discuss ILR (if his event monitor was unremarkable).  He never saw Dr. Lovena Le in FU.  Recently seen by Melina Copa, PA-C for DOE.  CXR suggested pneumonia and Levaquin was started.  He FU today with an eye towards LHC if his symptoms are no better with treatment of possible pneumonia.  Mg2+ was started due to slightly low levels.  He was set up for FU with Dr. Cristopher Peru.  He returns for FU.  Is feeling much better. His breathing is improved. He is now NYHA 2-2b. He is able to walk out to his mailbox and back without difficulty. He is able to visit his wife in the hospital as well without difficulty. These activities were giving him significant shortness of breath prior to seeing Dayna.  He denies chest pain. He denies orthopnea, PND or edema. He denies syncope.  He feels back to his baseline.  Echo was done last week - report was hard to find.  Echo shows normal LVF and Gr 1 diast dysfn.  Ascending aorta mildly dilated.   Studies/Reports Reviewed Today:   - Echo 3/16:  EF 60-65%, grade  1 diastolic dysfunction, ascending aorta mildly dilated (41 mm, aortic root 43 mm) - LHC (6/14): Proximal LAD 90%, proximal ramus 30%, mid circumflex 30%, mid RCA 40% >> PCI: 3 x 33 mm Xience DES to the proximal LAD - Echo (8/15): Mild LVH, EF 02%, grade 1 diastolic dysfunction, mildly dilated aortic root and moderately dilated ascending aorta (aortic root 41 mm, ascending aorta 44 mm), mild LAE, normal RV function, trivial effusion - TEE (6/14): Mild LVH, EF 60%, normal RV size and function, grade 4 plaque in the descending aorta, grade 3 plaque in the arch, very small PFO - TEE (8/15): Mild LVH, EF 55-60%, ascending aorta dilated 4.4 cm, No LAA clot, mild MR, small PFO by bubble study, grade 2 plaque in the descending thoracic aorta (venous US negative for DVT bilaterally) - Nuclear (5/14): Primarily fixed inferior wall defect, poor exercise tolerance, EF 56% - Event Monitor (10/15): NSR, no arrhythmias   Past Medical History: 1. HTN 2. Type II diabetes 3. Gout 4. OA s/p TKR 5. CKD: Suspect diabetic nephropathy 6. Hyperlipidemia 7. Prostate cancer: Diagnosed 5/14.  8. CAD: Echo (5/14) with EF 55-60%, moderate LVH. ETT-Sestamibi with hypertensive BP response, severe dyspnea, inferior fixed defect with EF 56%. Echo with moderate LVH, EF 55-60%. LHC (6/14) with 90% pLAD stenosis; this was treated with Xience  DES. Past Medical History  Diagnosis Date  . Arthritis   . Essential hypertension   . Elevated PSA   . Coronary artery disease     a. s/p Xience DES to the LAD 07/2012.  . Prostate cancer 07/15/12  . Insulin dependent diabetes mellitus   . Stroke     a. Cryptogenic, 09/2013, event monitor with NSR. Small PFO noted on echo but neg LE duplex.  [name] PVC's (premature ventricular contractions)   . PFO (patent foramen ovale)     a. By echo 09/2013.  . Dilated aortic root     a. Mildly dilated by echo 09/2013, f/u MRA scheduled for 12/2014.  [name] Hyperlipidemia   . Multifocal atrial  tachycardia     a. On tele 09/2013.  [name] Polycythemia     Past Surgical History  Procedure Laterality Date  . Kidney stones      2006  . Tonsillectomy      as adult  . Total knee arthroplasty  01/24/2011    Procedure: TOTAL KNEE ARTHROPLASTY;  Surgeon: Alta Corning;  Location: [facility];  Service: Orthopedics;  Laterality: Left;  COMPUTER ASSISTED LEFT  TOTAL KNEE REPLACEMENT. Anesthesia a combination of regional and general.  . Coronary angioplasty with stent placement  08/05/2012    LAD, 1 stent  . Prostate biopsy  07/15/2012  . Tee without cardioversion N/A 09/27/2013    Procedure: TRANSESOPHAGEAL ECHOCARDIOGRAM (TEE);  Surgeon: Larey Dresser, MD;  Location: [facility] ENDOSCOPY;  Service: Cardiovascular;  Laterality: N/A;  . Percutaneous coronary stent intervention (pci-s) N/A 08/05/2012    Procedure: PERCUTANEOUS CORONARY STENT INTERVENTION (PCI-S);  Surgeon: Sherren Mocha, MD;  Location: [facility] CATH LAB;  Service: Cardiovascular;  Laterality: N/A;     Current Outpatient Prescriptions  Medication Sig Dispense Refill  . allopurinol (ZYLOPRIM) 100 MG tablet TAKE TWO TABLETS BY MOUTH ONCE DAILY 30 tablet 11  . amLODipine (NORVASC) 10 MG tablet Take 10 mg by mouth daily.    [name] aspirin EC 81 MG tablet Take 1 tablet (81 mg total) by mouth daily.    [name] atorvastatin (LIPITOR) 40 MG tablet Take 1 tablet (40 mg total) by mouth daily at 6 PM. 90 tablet 1  . carvedilol (COREG) 12.5 MG tablet TAKE ONE TABLET BY MOUTH TWICE DAILY 60 tablet 5  . Cholecalciferol (VITAMIN D3) 5000 UNITS CAPS Take 10,000 Units by mouth daily.    . clopidogrel (PLAVIX) 75 MG tablet TAKE ONE TABLET BY MOUTH ONCE DAILY WITH BREAKFAST 30 tablet 6  . insulin glargine (LANTUS) 100 units/mL SOLN Inject 35 Units into the skin at bedtime.     [name] losartan-hydrochlorothiazide (HYZAAR) 100-25 MG per tablet TAKE ONE TABLET BY MOUTH ONCE DAILY 90 tablet 0  . magnesium oxide (MAG-OX) 400 MG tablet Take 1 tablet (400 mg total) by mouth 2 (two) times  daily. 60 tablet 6  . metFORMIN (GLUCOPHAGE) 1000 MG tablet 1 tab po qam & 1/2 tab po qpm    . vitamin E 1000 UNIT capsule Take 1,000 Units by mouth daily.     No current facility-administered medications for this visit.    Allergies:   Fish allergy    Social History:  The patient  reports that he has never smoked. He has never used smokeless tobacco. He reports that he does not drink alcohol or use illicit drugs.   Family History:  The patient's family history includes Cancer in his mother; Diabetes in his sister; Heart Problems in his brother, father, and  another family member; Hypertension in his brother; Leukemia in his mother; Stroke in his brother and another family member. There is no history of Heart attack.    ROS:   Please see the history of present illness.   Review of Systems  Cardiovascular: Positive for dyspnea on exertion and paroxysmal nocturnal dyspnea.  Neurological: Positive for dizziness.  All other systems reviewed and are negative.     PHYSICAL EXAM: VS:  BP 128/78 mmHg  Pulse 79  Ht 5\' 10"  (1.778 m)  Wt 265 lb (120.203 kg)  BMI 38.02 kg/m2    Wt Readings from Last 3 Encounters:  05/29/14 265 lb (120.203 kg)  05/17/14 268 lb 12.8 oz (121.927 kg)  05/16/14 269 lb 14.4 oz (122.426 kg)     GEN: Well nourished, well developed, in no acute distress HEENT: normal Neck: no JVD, no masses Cardiac:  Normal S1/S2, RRR; no murmur ,  no rubs or gallops, no edema  Respiratory:  clear to auscultation bilaterally, no wheezing, rhonchi or rales. GI: soft, nontender, nondistended, + BS MS: no deformity or atrophy Skin: warm and dry  Neuro:  CNs II-XII intact, Strength and sensation are intact Psych: Normal affect   EKG:  EKG is ordered today.  It demonstrates:   NSR, HR 79, LAD, inferior Q waves, anterior Q waves, PVCs, no change from prior tracing   Recent Labs: 03/28/2014: ALT 13; TSH 0.931 05/16/2014: Hemoglobin 18.6*; Platelets 241 05/17/2014: BUN 22;  Creatinine 1.36; Potassium 3.9; Sodium 139 05/24/2014: Magnesium 1.7    Lipid Panel    Component Value Date/Time   CHOL 118 03/28/2014 1013   TRIG 112 03/28/2014 1013   HDL 34* 03/28/2014 1013   CHOLHDL 3.5 03/28/2014 1013   VLDL 22 03/28/2014 1013   LDLCALC 62 03/28/2014 1013      ASSESSMENT AND PLAN:  Pneumonia, unspecified laterality, unspecified part of lung His exertional dyspnea seems to be related to community-acquired pneumonia. Treatment with antibiotics has provided significant improvement in his symptoms. No further cardiac workup is warranted at this time. Follow-up chest x-ray will be arranged.  Coronary artery disease involving native coronary artery of native heart without angina pectoris As Noted, his shortness of breath is improved. Continue aspirin, Plavix, beta blocker, ARB.  Essential hypertension Controlled.  He is due for FU BMET and Mg2+ today given initiation of MgOx recently.  Hyperlipidemia Continue statin.  Stroke FU with neurology as planned.  As noted, FU with Dr. Cristopher Peru will be arranged to discuss +/- ILR.  Dilated aortic root  MRA arranged already later this year.  Current medicines are reviewed at length with the patient today.  The patient does not have concerns regarding medicines.  The following changes have been made:  no change  Labs/ tests ordered today include:   Orders Placed This Encounter  Procedures  . DG Chest 2 View  . Basic Metabolic Panel (BMET)  . Magnesium  . EKG 12-Lead     Disposition:   FU with Dr. Loralie Champagne next month as planned or sooner if breathing worsens.    Signed, Versie Starks, MHS 05/29/2014 9:50 AM    [facility] [place], [place], [place]  [address] Phone: [phone]; Fax: [phone]

## 2014-05-29 ENCOUNTER — Ambulatory Visit (INDEPENDENT_AMBULATORY_CARE_PROVIDER_SITE_OTHER): Payer: Medicare HMO | Admitting: Physician Assistant

## 2014-05-29 ENCOUNTER — Encounter: Payer: Self-pay | Admitting: Physician Assistant

## 2014-05-29 ENCOUNTER — Encounter: Payer: Self-pay | Admitting: Neurology

## 2014-05-29 VITALS — BP 128/78 | HR 79 | Ht 70.0 in | Wt 265.0 lb

## 2014-05-29 DIAGNOSIS — I2510 Atherosclerotic heart disease of native coronary artery without angina pectoris: Secondary | ICD-10-CM

## 2014-05-29 DIAGNOSIS — E785 Hyperlipidemia, unspecified: Secondary | ICD-10-CM | POA: Diagnosis not present

## 2014-05-29 DIAGNOSIS — I77810 Thoracic aortic ectasia: Secondary | ICD-10-CM

## 2014-05-29 DIAGNOSIS — R0609 Other forms of dyspnea: Secondary | ICD-10-CM

## 2014-05-29 DIAGNOSIS — I10 Essential (primary) hypertension: Secondary | ICD-10-CM

## 2014-05-29 DIAGNOSIS — J189 Pneumonia, unspecified organism: Secondary | ICD-10-CM | POA: Diagnosis not present

## 2014-05-29 DIAGNOSIS — I639 Cerebral infarction, unspecified: Secondary | ICD-10-CM

## 2014-05-29 LAB — BASIC METABOLIC PANEL
BUN: 15 mg/dL (ref 6–23)
CO2: 33 mEq/L — ABNORMAL HIGH (ref 19–32)
Calcium: 9.1 mg/dL (ref 8.4–10.5)
Chloride: 103 mEq/L (ref 96–112)
Creatinine, Ser: 1.26 mg/dL (ref 0.40–1.50)
GFR: 73.26 mL/min (ref >60.00)
Glucose, Bld: 149 mg/dL — ABNORMAL HIGH (ref 70–99)
Potassium: 4.1 mEq/L (ref 3.5–5.1)
Sodium: 141 mEq/L (ref 135–145)

## 2014-05-29 LAB — MAGNESIUM: Magnesium: 2 mg/dL (ref 1.5–2.5)

## 2014-05-29 NOTE — Patient Instructions (Signed)
Your physician recommends that you return for lab work in: TODAY BMET, [place] DR. Aundra Dubin 06/2014  A chest x-ray THIS IS TO BE DONE EITHER ON Friday 4/8 OR Monday 4/11 AT [facility] takes a picture of the organs and structures inside the chest, including the heart, lungs, and blood vessels. This test can show several things, including, whether the heart is enlarges; whether fluid is building up in the lungs; and whether pacemaker / defibrillator leads are still in place.  FOLLOW UP WITH DR. Lovena Le TO DISCUSS ABOUT POSSIBLE LOOP RECORDER DX S/P CVA

## 2014-05-30 ENCOUNTER — Telehealth: Payer: Self-pay | Admitting: *Deleted

## 2014-05-30 NOTE — Telephone Encounter (Signed)
lmptcb to go over lab results 

## 2014-05-31 NOTE — Telephone Encounter (Signed)
lmptcb x 2 

## 2014-05-31 NOTE — Telephone Encounter (Signed)
pt notified about lab results with verbal understanding  

## 2014-06-01 ENCOUNTER — Other Ambulatory Visit: Payer: Self-pay | Admitting: Family Medicine

## 2014-06-01 ENCOUNTER — Ambulatory Visit
Admission: RE | Admit: 2014-06-01 | Discharge: 2014-06-01 | Disposition: A | Discharge status: Home / Self Care | Payer: Medicare HMO | Source: Ambulatory Visit | Attending: Physician Assistant | Admitting: Physician Assistant

## 2014-06-01 DIAGNOSIS — J189 Pneumonia, unspecified organism: Secondary | ICD-10-CM

## 2014-06-02 ENCOUNTER — Telehealth: Payer: Self-pay | Admitting: *Deleted

## 2014-06-02 NOTE — Telephone Encounter (Signed)
lmptcb x 2 

## 2014-06-02 NOTE — Telephone Encounter (Signed)
lmptcb to go over cxr results

## 2014-06-05 NOTE — Telephone Encounter (Signed)
Follow up ° ° ° ° °Returning a nurses call from friday ° ° ° ° ° ° °

## 2014-06-05 NOTE — Telephone Encounter (Signed)
ptcb earlier today while I was in clinic, I then rtnd pt's call tonight and got his voice mail again. Lmptcb [phone] to go over results.

## 2014-06-06 NOTE — Telephone Encounter (Signed)
s/w pt's wife daughter Joelene Millin. She gave me another # to reach pt; [phone]. I thanked her for her help.

## 2014-06-06 NOTE — Telephone Encounter (Signed)
pt notified about cxr results with verbal understanding. Pt states he feels like he is still dealing w/the pneumonia and wanted to know what to do. I advised he should call PCP to further evaluate the pneumonia. Pt said ok and thank you.

## 2014-06-12 ENCOUNTER — Ambulatory Visit: Payer: Medicare HMO | Admitting: Internal Medicine

## 2014-06-12 ENCOUNTER — Institutional Professional Consult (permissible substitution): Payer: Medicare HMO | Admitting: Internal Medicine

## 2014-06-13 ENCOUNTER — Ambulatory Visit (HOSPITAL_BASED_OUTPATIENT_CLINIC_OR_DEPARTMENT_OTHER): Payer: Medicare HMO

## 2014-06-13 ENCOUNTER — Other Ambulatory Visit (HOSPITAL_BASED_OUTPATIENT_CLINIC_OR_DEPARTMENT_OTHER): Payer: Medicare HMO

## 2014-06-13 DIAGNOSIS — D751 Secondary polycythemia: Secondary | ICD-10-CM | POA: Diagnosis not present

## 2014-06-13 LAB — CBC WITH DIFFERENTIAL/PLATELET
BASO%: 0.3 % (ref 0.0–2.0)
Basophils Absolute: 0 10*3/uL (ref 0.0–0.1)
EOS%: 3.9 % (ref 0.0–7.0)
Eosinophils Absolute: 0.4 10*3/uL (ref 0.0–0.5)
HCT: 55.6 % — ABNORMAL HIGH (ref 38.4–49.9)
HGB: 17.4 g/dL — ABNORMAL HIGH (ref 13.0–17.1)
LYMPH%: 21.2 % (ref 14.0–49.0)
MCH: 30.7 pg (ref 27.2–33.4)
MCHC: 31.3 g/dL — ABNORMAL LOW (ref 32.0–36.0)
MCV: 98.2 fL — ABNORMAL HIGH (ref 79.3–98.0)
MONO#: 1 10*3/uL — ABNORMAL HIGH (ref 0.1–0.9)
MONO%: 11.4 % (ref 0.0–14.0)
NEUT#: 5.6 10*3/uL (ref 1.5–6.5)
NEUT%: 63.2 % (ref 39.0–75.0)
Platelets: 253 10*3/uL (ref 140–400)
RBC: 5.66 10*6/uL (ref 4.20–5.82)
RDW: 15.2 % — ABNORMAL HIGH (ref 11.0–14.6)
WBC: 8.9 10*3/uL (ref 4.0–10.3)
lymph#: 1.9 10*3/uL (ref 0.9–3.3)

## 2014-06-13 NOTE — Patient Instructions (Signed)

## 2014-06-13 NOTE — Progress Notes (Signed)
[id number]: Therapeutic phlebotomy performed without difficulty. 594 units obtained (94 fluid in bag) Pt drinking fluids post.

## 2014-06-19 ENCOUNTER — Ambulatory Visit (INDEPENDENT_AMBULATORY_CARE_PROVIDER_SITE_OTHER): Payer: Medicare HMO | Admitting: Family Medicine

## 2014-06-19 ENCOUNTER — Encounter: Payer: Self-pay | Admitting: Family Medicine

## 2014-06-19 VITALS — BP 100/60 | HR 76 | Temp 98.7°F | Resp 18 | Ht 70.0 in | Wt 265.0 lb

## 2014-06-19 DIAGNOSIS — J208 Acute bronchitis due to other specified organisms: Secondary | ICD-10-CM | POA: Diagnosis not present

## 2014-06-19 MED ORDER — AZITHROMYCIN 250 MG PO TABS: ORAL_TABLET | ORAL | Status: DC

## 2014-06-19 MED ORDER — PREDNISONE 20 MG PO TABS: ORAL_TABLET | ORAL | Status: DC

## 2014-06-19 NOTE — Progress Notes (Signed)
Subjective:    Patient ID: James Mcclain, male    DOB: September 30, 1945, 69 y.o.   MRN: [id number]  HPI  Patient was seen by his cardiologist and was treated for pneumonia with Levaquin earlier this year. Symptoms improved but then shortly returned after discontinuing antibiotics. X-ray on April 7 revealed left lower lobe and right middle lobe atelectasis versus infiltrate. Ever since that time, the patient is developing increasing chest congestion, shortness of breath, and left-sided pleurisy. Today on examination, the patient has markedly diminished breath sounds throughout his lungs. He has a prolonged expiratory phase and decreased air movement. There is slight expiratory wheezes. I do not appreciate any Rales or rhonchi. I did perform pulmonary function test today in office. His FEV1 to FVC ratio was 77%. His FEV1 was 127% of predicted. Past Medical History  Diagnosis Date  . Arthritis   . Essential hypertension   . Elevated PSA   . Coronary artery disease     a. s/p Xience DES to the LAD 07/2012.  . Prostate cancer 07/15/12  . Insulin dependent diabetes mellitus   . Stroke     a. Cryptogenic, 09/2013, event monitor with NSR. Small PFO noted on echo but neg LE duplex.  [name] PVC's (premature ventricular contractions)   . PFO (patent foramen ovale)     a. By echo 09/2013.  . Dilated aortic root     a. Mildly dilated by echo 09/2013, f/u MRA scheduled for 12/2014.  [name] Hyperlipidemia   . Multifocal atrial tachycardia     a. On tele 09/2013.  [name] Polycythemia    Past Surgical History  Procedure Laterality Date  . Kidney stones      2006  . Tonsillectomy      as adult  . Total knee arthroplasty  01/24/2011    Procedure: TOTAL KNEE ARTHROPLASTY;  Surgeon: Alta Corning;  Location: [place];  Service: Orthopedics;  Laterality: Left;  COMPUTER ASSISTED LEFT  TOTAL KNEE REPLACEMENT. Anesthesia a combination of regional and general.  . Coronary angioplasty with stent placement  08/05/2012    LAD, 1 stent    . Prostate biopsy  07/15/2012  . Tee without cardioversion N/A 09/27/2013    Procedure: TRANSESOPHAGEAL ECHOCARDIOGRAM (TEE);  Surgeon: Larey Dresser, MD;  Location: [facility] ENDOSCOPY;  Service: Cardiovascular;  Laterality: N/A;  . Percutaneous coronary stent intervention (pci-s) N/A 08/05/2012    Procedure: PERCUTANEOUS CORONARY STENT INTERVENTION (PCI-S);  Surgeon: Sherren Mocha, MD;  Location: [facility] CATH LAB;  Service: Cardiovascular;  Laterality: N/A;   Current Outpatient Prescriptions on File Prior to Visit  Medication Sig Dispense Refill  . allopurinol (ZYLOPRIM) 100 MG tablet TAKE TWO TABLETS BY MOUTH ONCE DAILY 30 tablet 11  . amLODipine (NORVASC) 10 MG tablet Take 10 mg by mouth daily.    [name] aspirin EC 81 MG tablet Take 1 tablet (81 mg total) by mouth daily.    [name] atorvastatin (LIPITOR) 40 MG tablet Take 1 tablet (40 mg total) by mouth daily at 6 PM. 90 tablet 1  . carvedilol (COREG) 12.5 MG tablet TAKE ONE TABLET BY MOUTH TWICE DAILY 60 tablet 5  . Cholecalciferol (VITAMIN D3) 5000 UNITS CAPS Take 10,000 Units by mouth daily.    . clopidogrel (PLAVIX) 75 MG tablet TAKE ONE TABLET BY MOUTH ONCE DAILY WITH BREAKFAST 30 tablet 6  . insulin glargine (LANTUS) 100 units/mL SOLN Inject 45 Units into the skin at bedtime.     [name] losartan-hydrochlorothiazide (HYZAAR) 100-25 MG per tablet  TAKE ONE TABLET BY MOUTH ONCE DAILY 90 tablet 0  . magnesium oxide (MAG-OX) 400 MG tablet Take 1 tablet (400 mg total) by mouth 2 (two) times daily. 60 tablet 6  . metFORMIN (GLUCOPHAGE) 1000 MG tablet 1 tab po qam & 1/2 tab po qpm    . vitamin E 1000 UNIT capsule Take 1,000 Units by mouth daily.     No current facility-administered medications on file prior to visit.   Allergies  Allergen Reactions  . Fish Allergy Swelling   History   Social History  . Marital Status: Married    Spouse Name: N/A  . Number of Children: 3  . Years of Education: 12th   Occupational History  . retired    Social History Main  Topics  . Smoking status: Never Smoker   . Smokeless tobacco: Never Used  . Alcohol Use: No  . Drug Use: No  . Sexual Activity: Not Currently   Other Topics Concern  . Not on file   Social History Narrative   [name]   Patient is right handed   Patient drinks coffee and sodas     Review of Systems  All other systems reviewed and are negative.      Objective:   Physical Exam  Neck: No JVD present.  Cardiovascular: Normal rate, regular rhythm and normal heart sounds.   Pulmonary/Chest: Effort normal. No respiratory distress. He has decreased breath sounds. He has wheezes. He has no rales.  Musculoskeletal: He exhibits no edema.  Vitals reviewed.         Assessment & Plan:  Acute bronchitis due to other specified organisms - Plan: predniSONE (DELTASONE) 20 MG tablet, azithromycin (ZITHROMAX Z-PAK) 250 MG tablet  I believe the patient is suffering from bronchitis with reactive airway disease and a component of airway obstruction even though his pulmonary function tests were normal. I will treat this with a combination of prednisone for reactive airway disease as well as a Z-Pak to treat bacterial bronchitis.  I explained to the patient that the prednisone will increase his blood sugar substantially over the next 6 days but this will be temporary. We discussed strategies to help manage his blood sugar temporarily. Recheck in one week if no better or sooner if worse.  He has over 35 years of secondhand smoke exposure due to his wife

## 2014-06-21 ENCOUNTER — Encounter: Payer: Self-pay | Admitting: Internal Medicine

## 2014-06-23 ENCOUNTER — Other Ambulatory Visit: Payer: Self-pay | Admitting: Family Medicine

## 2014-07-03 ENCOUNTER — Emergency Department (HOSPITAL_COMMUNITY)
Admission: EM | Admit: 2014-07-03 | Discharge: 2014-07-03 | Disposition: A | Discharge status: Home / Self Care | Payer: Medicare HMO | Source: Home / Self Care | Attending: Family Medicine | Admitting: Family Medicine

## 2014-07-03 ENCOUNTER — Telehealth: Payer: Self-pay

## 2014-07-03 ENCOUNTER — Encounter (HOSPITAL_COMMUNITY): Payer: Self-pay | Admitting: *Deleted

## 2014-07-03 DIAGNOSIS — I10 Essential (primary) hypertension: Secondary | ICD-10-CM | POA: Diagnosis not present

## 2014-07-03 DIAGNOSIS — E1165 Type 2 diabetes mellitus with hyperglycemia: Secondary | ICD-10-CM | POA: Diagnosis not present

## 2014-07-03 LAB — POCT I-STAT, CHEM 8
BUN: 19 mg/dL (ref 6–20)
Calcium, Ion: 1.17 mmol/L (ref 1.13–1.30)
Chloride: 99 mmol/L — ABNORMAL LOW (ref 101–111)
Creatinine, Ser: 1.1 mg/dL (ref 0.61–1.24)
Glucose, Bld: 285 mg/dL — ABNORMAL HIGH (ref 70–99)
HCT: 59 % — ABNORMAL HIGH (ref 39.0–52.0)
Hemoglobin: 20.1 g/dL — ABNORMAL HIGH (ref 13.0–17.0)
Potassium: 4.3 mmol/L (ref 3.5–5.1)
Sodium: 139 mmol/L (ref 135–145)
TCO2: 28 mmol/L (ref 0–100)

## 2014-07-03 LAB — POCT URINALYSIS DIP (DEVICE)
Bilirubin Urine: NEGATIVE
Glucose, UA: 500 mg/dL — AB
Ketones, ur: NEGATIVE mg/dL
Leukocytes, UA: NEGATIVE
Nitrite: NEGATIVE
Protein, ur: 100 mg/dL — AB
Specific Gravity, Urine: 1.02 (ref 1.005–1.030)
Urobilinogen, UA: 0.2 mg/dL (ref 0.0–1.0)
pH: 5 (ref 5.0–8.0)

## 2014-07-03 NOTE — Telephone Encounter (Signed)
Left message to return a call for research visit.

## 2014-07-03 NOTE — ED Provider Notes (Signed)
CSN: [id number]     Arrival date & time 07/03/14  1120 History   First MD Initiated Contact with Patient 07/03/14 1343     Chief Complaint  Patient presents with  . Weakness   (Consider location/radiation/quality/duration/timing/severity/associated sxs/prior Treatment) HPI Comments: Patient states that he knows he has not been sticking to his diabetic diet nor taking overall good care of himself lately. States his wife is currently in the hospital and he is trying to care for her and for his 39 y/o grandchild that has recently moved into his home. He has been very busy and has been eating "on the go" items such as fast food and vending machine items. States he feels generally fatigued and tired and has noticed overall increase in urine output and increased thirst. Denies focal weakness or sensory loss.   Patient is a 69 y.o. male presenting with weakness. The history is provided by the patient.  Weakness This is a new problem. Episode onset: 3 weeks ago. Progression since onset: waxing and waning. Pertinent negatives include no headaches. Associated symptoms comments: Increased thirst and increased urine output.    Past Medical History  Diagnosis Date  . Arthritis   . Essential hypertension   . Elevated PSA   . Coronary artery disease     a. s/p Xience DES to the LAD 07/2012.  . Prostate cancer 07/15/12  . Insulin dependent diabetes mellitus   . Stroke     a. Cryptogenic, 09/2013, event monitor with NSR. Small PFO noted on echo but neg LE duplex.  [name] PVC's (premature ventricular contractions)   . PFO (patent foramen ovale)     a. By echo 09/2013.  . Dilated aortic root     a. Mildly dilated by echo 09/2013, f/u MRA scheduled for 12/2014.  [name] Hyperlipidemia   . Multifocal atrial tachycardia     a. On tele 09/2013.  [name] Polycythemia    Past Surgical History  Procedure Laterality Date  . Kidney stones      2006  . Tonsillectomy      as adult  . Total knee arthroplasty  01/24/2011     Procedure: TOTAL KNEE ARTHROPLASTY;  Surgeon: Alta Corning;  Location: [place];  Service: Orthopedics;  Laterality: Left;  COMPUTER ASSISTED LEFT  TOTAL KNEE REPLACEMENT. Anesthesia a combination of regional and general.  . Coronary angioplasty with stent placement  08/05/2012    LAD, 1 stent  . Prostate biopsy  07/15/2012  . Tee without cardioversion N/A 09/27/2013    Procedure: TRANSESOPHAGEAL ECHOCARDIOGRAM (TEE);  Surgeon: Larey Dresser, MD;  Location: [facility] ENDOSCOPY;  Service: Cardiovascular;  Laterality: N/A;  . Percutaneous coronary stent intervention (pci-s) N/A 08/05/2012    Procedure: PERCUTANEOUS CORONARY STENT INTERVENTION (PCI-S);  Surgeon: Sherren Mocha, MD;  Location: [facility] CATH LAB;  Service: Cardiovascular;  Laterality: N/A;   Family History  Problem Relation Age of Onset  . Leukemia Mother   . Heart Problems Father   . Heart Problems      family history  . Diabetes Sister   . Heart Problems Brother   . Heart attack Neg Hx   . Stroke    . Stroke Brother   . Hypertension Brother   . Cancer Mother    History  Substance Use Topics  . Smoking status: Never Smoker   . Smokeless tobacco: Never Used  . Alcohol Use: No    Review of Systems  Constitutional: Positive for fatigue. Negative for fever, chills and appetite change.  HENT: Negative.   Eyes: Negative.   Respiratory: Negative.   Cardiovascular: Negative.   Gastrointestinal: Negative.   Endocrine: Positive for polydipsia and polyuria. Negative for polyphagia.  Genitourinary: Positive for frequency. Negative for urgency, hematuria, flank pain and difficulty urinating.  Musculoskeletal: Negative.   Skin: Negative.   Neurological: Positive for weakness. Negative for dizziness, tremors, syncope, speech difficulty, light-headedness, numbness and headaches.    Allergies  Fish allergy  Home Medications   Prior to Admission medications   Medication Sig Start Date End Date Taking? Authorizing Provider  allopurinol  (ZYLOPRIM) 100 MG tablet TAKE TWO TABLETS BY MOUTH ONCE DAILY 01/02/14   Susy Frizzle, MD  amLODipine (NORVASC) 10 MG tablet Take 10 mg by mouth daily.    Historical Provider, MD  amLODipine (NORVASC) 10 MG tablet TAKE ONE TABLET BY MOUTH ONCE DAILY 06/26/14   Susy Frizzle, MD  aspirin EC 81 MG tablet Take 1 tablet (81 mg total) by mouth daily. 06/22/12   Larey Dresser, MD  atorvastatin (LIPITOR) 40 MG tablet Take 1 tablet (40 mg total) by mouth daily at 6 PM. 05/22/14   Susy Frizzle, MD  azithromycin (ZITHROMAX Z-PAK) 250 MG tablet 2 pills on day 1, 1 pill on days 2-5 06/19/14   Susy Frizzle, MD  carvedilol (COREG) 12.5 MG tablet TAKE ONE TABLET BY MOUTH TWICE DAILY 03/20/14   Larey Dresser, MD  Cholecalciferol (VITAMIN D3) 5000 UNITS CAPS Take 10,000 Units by mouth daily.    Historical Provider, MD  clopidogrel (PLAVIX) 75 MG tablet TAKE ONE TABLET BY MOUTH ONCE DAILY WITH BREAKFAST 01/17/14   Larey Dresser, MD  insulin glargine (LANTUS) 100 units/mL SOLN Inject 45 Units into the skin at bedtime.     Historical Provider, MD  losartan-hydrochlorothiazide (HYZAAR) 100-25 MG per tablet TAKE ONE TABLET BY MOUTH ONCE DAILY 05/25/14   Susy Frizzle, MD  magnesium oxide (MAG-OX) 400 MG tablet Take 1 tablet (400 mg total) by mouth 2 (two) times daily. 05/26/14   Dayna N Dunn, PA-C  metFORMIN (GLUCOPHAGE) 1000 MG tablet 1 tab po qam & 1/2 tab po qpm    Historical Provider, MD  predniSONE (DELTASONE) 20 MG tablet 3 tabs poqday 1-2, 2 tabs poqday 3-4, 1 tab poqday 5-6 06/19/14   Susy Frizzle, MD  vitamin E 1000 UNIT capsule Take 1,000 Units by mouth daily.    Historical Provider, MD   BP 153/85 mmHg  Pulse 68  Temp(Src) 97.4 F (36.3 C) (Oral)  Resp 14  SpO2 99% Physical Exam  Constitutional: He is oriented to person, place, and time. He appears well-developed and well-nourished. No distress.  HENT:  Head: Normocephalic and atraumatic.  Mouth/Throat: Oropharynx is clear and moist.   Eyes: Conjunctivae and EOM are normal. Pupils are equal, round, and reactive to light.  Neck: Normal range of motion. Neck supple.  Cardiovascular: Normal rate, regular rhythm and normal heart sounds.   Pulmonary/Chest: Effort normal and breath sounds normal.  Abdominal: Soft. Bowel sounds are normal. He exhibits no distension. There is no tenderness.  Musculoskeletal: Normal range of motion.  Neurological: He is alert and oriented to person, place, and time. He has normal strength. No cranial nerve deficit or sensory deficit. Coordination and gait normal. GCS eye subscore is 4. GCS verbal subscore is 5. GCS motor subscore is 6.  Skin: Skin is warm and dry. No rash noted. No erythema.  Psychiatric: He has a normal mood and affect. His  behavior is normal.  Nursing note and vitals reviewed.   ED Course  Procedures (including critical care time) Labs Review Labs Reviewed  POCT URINALYSIS DIP (DEVICE) - Abnormal; Notable for the following:    Glucose, UA 500 (*)    Hgb urine dipstick TRACE (*)    Protein, ur 100 (*)    All other components within normal limits  POCT I-STAT, CHEM 8 - Abnormal; Notable for the following:    Chloride 99 (*)    Glucose, Bld 285 (*)    Hemoglobin 20.1 (*)    HCT 59.0 (*)    All other components within normal limits    Imaging Review No results found.   MDM   1. Hyperglycemia due to type 2 diabetes mellitus   UA consistent with glucosuria without presence of ketonuria. No indicators of infection. Tace hematuria and chronic proteinuria. Istat 8 consistent with hyperglycemia and concentrated hemoglobin and hematocrit EKG  without acute abnormality and unchanged from March 2016.  I suspect his generalized fatigue is a combination of life/emotional stressors in additional to poor control over T2DM over past few weeks. I strongly recommended he contact his PCP office today to discuss and determine if meds need adjustment and arrange for follow up  evaluation.     Lutricia Feil, [place] 07/03/14 1434

## 2014-07-03 NOTE — Discharge Instructions (Signed)
As we discussed, as much as you are able to stick to a diabetic diet, please do so. This will help lower your blood sugar. Please use medications as directed and contact Dr. Samella Parr office today to discuss potential changes to your medication regimen and follow up plan. If symptoms become suddenly worse or severe, please seek re-evaluation at your nearest emergency room. I wish your wife a speedy recovery.   Diabetes Mellitus and Food It is important for you to manage your blood sugar (glucose) level. Your blood glucose level can be greatly affected by what you eat. Eating healthier foods in the appropriate amounts throughout the day at about the same time each day will help you control your blood glucose level. It can also help slow or prevent worsening of your diabetes mellitus. Healthy eating may even help you improve the level of your blood pressure and reach or maintain a healthy weight.  HOW CAN FOOD AFFECT ME? Carbohydrates Carbohydrates affect your blood glucose level more than any other type of food. Your dietitian will help you determine how many carbohydrates to eat at each meal and teach you how to count carbohydrates. Counting carbohydrates is important to keep your blood glucose at a healthy level, especially if you are using insulin or taking certain medicines for diabetes mellitus. Alcohol Alcohol can cause sudden decreases in blood glucose (hypoglycemia), especially if you use insulin or take certain medicines for diabetes mellitus. Hypoglycemia can be a life-threatening condition. Symptoms of hypoglycemia (sleepiness, dizziness, and disorientation) are similar to symptoms of having too much alcohol.  If your health care provider has given you approval to drink alcohol, do so in moderation and use the following guidelines:  Women should not have more than one drink per day, and men should not have more than two drinks per day. One drink is equal to:  12 oz of beer.  5 oz of  wine.  1 oz of hard liquor.  Do not drink on an empty stomach.  Keep yourself hydrated. Have water, diet soda, or unsweetened iced tea.  Regular soda, juice, and other mixers might contain a lot of carbohydrates and should be counted. WHAT FOODS ARE NOT RECOMMENDED? As you make food choices, it is important to remember that all foods are not the same. Some foods have fewer nutrients per serving than other foods, even though they might have the same number of calories or carbohydrates. It is difficult to get your body what it needs when you eat foods with fewer nutrients. Examples of foods that you should avoid that are high in calories and carbohydrates but low in nutrients include:  Trans fats (most processed foods list trans fats on the Nutrition Facts label).  Regular soda.  Juice.  Candy.  Sweets, such as cake, pie, doughnuts, and cookies.  Fried foods. WHAT FOODS CAN I EAT? Have nutrient-[place] foods, which will nourish your body and keep you healthy. The food you should eat also will depend on several factors, including:  The calories you need.  The medicines you take.  Your weight.  Your blood glucose level.  Your blood pressure level.  Your cholesterol level. You also should eat a variety of foods, including:  Protein, such as meat, poultry, fish, tofu, nuts, and seeds (lean animal proteins are best).  Fruits.  Vegetables.  Dairy products, such as milk, cheese, and yogurt (low fat is best).  Breads, grains, pasta, cereal, rice, and beans.  Fats such as olive oil, trans fat-free margarine, canola  oil, avocado, and olives. DOES EVERYONE WITH DIABETES MELLITUS HAVE THE SAME MEAL PLAN? Because every person with diabetes mellitus is different, there is not one meal plan that works for everyone. It is very important that you meet with a dietitian who will help you create a meal plan that is just right for you. Document Released: 11/07/2004 Document Revised:  02/15/2013 Document Reviewed: 01/07/2013 [facility] Patient Information 2015 [place], [place]. This information is not intended to replace advice given to you by your health care provider. Make sure you discuss any questions you have with your health care provider.  Hyperglycemia Hyperglycemia occurs when the glucose (sugar) in your blood is too high. Hyperglycemia can happen for many reasons, but it most often happens to people who do not know they have diabetes or are not managing their diabetes properly.  CAUSES  Whether you have diabetes or not, there are other causes of hyperglycemia. Hyperglycemia can occur when you have diabetes, but it can also occur in other situations that you might not be as aware of, such as: Diabetes  If you have diabetes and are having problems controlling your blood glucose, hyperglycemia could occur because of some of the following reasons:  Not following your meal plan.  Not taking your diabetes medications or not taking it properly.  Exercising less or doing less activity than you normally do.  Being sick. Pre-diabetes  This cannot be ignored. Before people develop Type 2 diabetes, they almost always have "pre-diabetes." This is when your blood glucose levels are higher than normal, but not yet high enough to be diagnosed as diabetes. Research has shown that some long-term damage to the body, especially the heart and circulatory system, may already be occurring during pre-diabetes. If you take action to manage your blood glucose when you have pre-diabetes, you may delay or prevent Type 2 diabetes from developing. Stress  If you have diabetes, you may be "diet" controlled or on oral medications or insulin to control your diabetes. However, you may find that your blood glucose is higher than usual in the hospital whether you have diabetes or not. This is often referred to as "stress hyperglycemia." Stress can elevate your blood glucose. This happens because of  hormones put out by the body during times of stress. If stress has been the cause of your high blood glucose, it can be followed regularly by your caregiver. That way he/she can make sure your hyperglycemia does not continue to get worse or progress to diabetes. Steroids  Steroids are medications that act on the infection fighting system (immune system) to block inflammation or infection. One side effect can be a rise in blood glucose. Most people can produce enough extra insulin to allow for this rise, but for those who cannot, steroids make blood glucose levels go even higher. It is not unusual for steroid treatments to "uncover" diabetes that is developing. It is not always possible to determine if the hyperglycemia will go away after the steroids are stopped. A special blood test called an A1c is sometimes done to determine if your blood glucose was elevated before the steroids were started. SYMPTOMS  Thirsty.  Frequent urination.  Dry mouth.  Blurred vision.  Tired or fatigue.  Weakness.  Sleepy.  Tingling in feet or leg. DIAGNOSIS  Diagnosis is made by monitoring blood glucose in one or all of the following ways:  A1c test. This is a chemical found in your blood.  Fingerstick blood glucose monitoring.  Laboratory results. TREATMENT  First, knowing the cause of the hyperglycemia is important before the hyperglycemia can be treated. Treatment may include, but is not be limited to:  Education.  Change or adjustment in medications.  Change or adjustment in meal plan.  Treatment for an illness, infection, etc.  More frequent blood glucose monitoring.  Change in exercise plan.  Decreasing or stopping steroids.  Lifestyle changes. HOME CARE INSTRUCTIONS   Test your blood glucose as directed.  Exercise regularly. Your caregiver will give you instructions about exercise. Pre-diabetes or diabetes which comes on with stress is helped by exercising.  Eat wholesome,  balanced meals. Eat often and at regular, fixed times. Your caregiver or nutritionist will give you a meal plan to guide your sugar intake.  Being at an ideal weight is important. If needed, losing as little as 10 to 15 pounds may help improve blood glucose levels. SEEK MEDICAL CARE IF:   You have questions about medicine, activity, or diet.  You continue to have symptoms (problems such as increased thirst, urination, or weight gain). SEEK IMMEDIATE MEDICAL CARE IF:   You are vomiting or have diarrhea.  Your breath smells fruity.  You are breathing faster or slower.  You are very sleepy or incoherent.  You have numbness, tingling, or pain in your feet or hands.  You have chest pain.  Your symptoms get worse even though you have been following your caregiver's orders.  If you have any other questions or concerns. Document Released: 08/06/2000 Document Revised: 05/05/2011 Document Reviewed: 06/09/2011 [facility] Patient Information 2015 [place], [place]. This information is not intended to replace advice given to you by your health care provider. Make sure you discuss any questions you have with your health care provider.  Type 2 Diabetes Mellitus Type 2 diabetes mellitus, often simply referred to as type 2 diabetes, is a long-lasting (chronic) disease. In type 2 diabetes, the pancreas does not make enough insulin (a hormone), the cells are less responsive to the insulin that is made (insulin resistance), or both. Normally, insulin moves sugars from food into the tissue cells. The tissue cells use the sugars for energy. The lack of insulin or the lack of normal response to insulin causes excess sugars to build up in the blood instead of going into the tissue cells. As a result, high blood sugar (hyperglycemia) develops. The effect of high sugar (glucose) levels can cause many complications. Type 2 diabetes was also previously called adult-onset diabetes, but it can occur at any age.   RISK FACTORS  A person is predisposed to developing type 2 diabetes if someone in the family has the disease and also has one or more of the following primary risk factors:  Overweight.  An inactive lifestyle.  A history of consistently eating high-calorie foods. Maintaining a normal weight and regular physical activity can reduce the chance of developing type 2 diabetes. SYMPTOMS  A person with type 2 diabetes may not show symptoms initially. The symptoms of type 2 diabetes appear slowly. The symptoms include:  Increased thirst (polydipsia).  Increased urination (polyuria).  Increased urination during the night (nocturia).  Weight loss. This weight loss may be rapid.  Frequent, recurring infections.  Tiredness (fatigue).  Weakness.  Vision changes, such as blurred vision.  Fruity smell to your breath.  Abdominal pain.  Nausea or vomiting.  Cuts or bruises which are slow to heal.  Tingling or numbness in the hands or feet. DIAGNOSIS Type 2 diabetes is frequently not diagnosed until complications of diabetes are  present. Type 2 diabetes is diagnosed when symptoms or complications are present and when blood glucose levels are increased. Your blood glucose level may be checked by one or more of the following blood tests:  A fasting blood glucose test. You will not be allowed to eat for at least 8 hours before a blood sample is taken.  A random blood glucose test. Your blood glucose is checked at any time of the day regardless of when you ate.  A hemoglobin A1c blood glucose test. A hemoglobin A1c test provides information about blood glucose control over the previous 3 months.  An oral glucose tolerance test (OGTT). Your blood glucose is measured after you have not eaten (fasted) for 2 hours and then after you drink a glucose-containing beverage. TREATMENT   You may need to take insulin or diabetes medicine daily to keep blood glucose levels in the desired  range.  If you use insulin, you may need to adjust the dosage depending on the carbohydrates that you eat with each meal or snack. The treatment goal is to maintain the before meal blood sugar (preprandial glucose) level at 70-130 mg/dL. HOME CARE INSTRUCTIONS   Have your hemoglobin A1c level checked twice a year.  Perform daily blood glucose monitoring as directed by your health care provider.  Monitor urine ketones when you are ill and as directed by your health care provider.  Take your diabetes medicine or insulin as directed by your health care provider to maintain your blood glucose levels in the desired range.  Never run out of diabetes medicine or insulin. It is needed every day.  If you are using insulin, you may need to adjust the amount of insulin given based on your intake of carbohydrates. Carbohydrates can raise blood glucose levels but need to be included in your diet. Carbohydrates provide vitamins, minerals, and fiber which are an essential part of a healthy diet. Carbohydrates are found in fruits, vegetables, whole grains, dairy products, legumes, and foods containing added sugars.  Eat healthy foods. You should make an appointment to see a registered dietitian to help you create an eating plan that is right for you.  Lose weight if you are overweight.  Carry a medical alert card or wear your medical alert jewelry.  Carry a 15-gram carbohydrate snack with you at all times to treat low blood glucose (hypoglycemia). Some examples of 15-gram carbohydrate snacks include:  Glucose tablets, 3 or 4.  Glucose gel, 15-gram tube.  Raisins, 2 tablespoons (24 grams).  Jelly beans, 6.  Animal crackers, 8.  Regular pop, 4 ounces (120 mL).  Gummy treats, 9.  Recognize hypoglycemia. Hypoglycemia occurs with blood glucose levels of 70 mg/dL and below. The risk for hypoglycemia increases when fasting or skipping meals, during or after intense exercise, and during sleep.  Hypoglycemia symptoms can include:  Tremors or shakes.  Decreased ability to concentrate.  Sweating.  Increased heart rate.  Headache.  Dry mouth.  Hunger.  Irritability.  Anxiety.  Restless sleep.  Altered speech or coordination.  Confusion.  Treat hypoglycemia promptly. If you are alert and able to safely swallow, follow the 15:15 rule:  Take 15-20 grams of rapid-acting glucose or carbohydrate. Rapid-acting options include glucose gel, glucose tablets, or 4 ounces (120 mL) of fruit juice, regular soda, or low-fat milk.  Check your blood glucose level 15 minutes after taking the glucose.  Take 15-20 grams more of glucose if the repeat blood glucose level is still 70 mg/dL or below.  Eat a meal or snack within 1 hour once blood glucose levels return to normal.  Be alert to feeling very thirsty and urinating more frequently than usual, which are early signs of hyperglycemia. An early awareness of hyperglycemia allows for prompt treatment. Treat hyperglycemia as directed by your health care provider.  Engage in at least 150 minutes of moderate-intensity physical activity a week, spread over at least 3 days of the week or as directed by your health care provider. In addition, you should engage in resistance exercise at least 2 times a week or as directed by your health care provider. Try to spend no more than 90 minutes at one time inactive.  Adjust your medicine and food intake as needed if you start a new exercise or sport.  Follow your sick-day plan anytime you are unable to eat or drink as usual.  Do not use any tobacco products including cigarettes, chewing tobacco, or electronic cigarettes. If you need help quitting, ask your health care provider.  Limit alcohol intake to no more than 1 drink per day for nonpregnant women and 2 drinks per day for men. You should drink alcohol only when you are also eating food. Talk with your health care provider whether alcohol is  safe for you. Tell your health care provider if you drink alcohol several times a week.  Keep all follow-up visits as directed by your health care provider. This is important.  Schedule an eye exam soon after the diagnosis of type 2 diabetes and then annually.  Perform daily skin and foot care. Examine your skin and feet daily for cuts, bruises, redness, nail problems, bleeding, blisters, or sores. A foot exam by a health care provider should be done annually.  Brush your teeth and gums at least twice a day and floss at least once a day. Follow up with your dentist regularly.  Share your diabetes management plan with your workplace or school.  Stay up-to-date with immunizations. It is recommended that people with diabetes who are over 61 years old get the pneumonia vaccine. In some cases, two separate shots may be given. Ask your health care provider if your pneumonia vaccination is up-to-date.  Learn to manage stress.  Obtain ongoing diabetes education and support as needed.  Participate in or seek rehabilitation as needed to maintain or improve independence and quality of life. Request a physical or occupational therapy referral if you are having foot or hand numbness, or difficulties with grooming, dressing, eating, or physical activity. SEEK MEDICAL CARE IF:   You are unable to eat food or drink fluids for more than 6 hours.  You have nausea and vomiting for more than 6 hours.  Your blood glucose level is over 240 mg/dL.  There is a change in mental status.  You develop an additional serious illness.  You have diarrhea for more than 6 hours.  You have been sick or have had a fever for a couple of days and are not getting better.  You have pain during any physical activity.  SEEK IMMEDIATE MEDICAL CARE IF:  You have difficulty breathing.  You have moderate to large ketone levels. MAKE SURE YOU:  Understand these instructions.  Will watch your condition.  Will get  help right away if you are not doing well or get worse. Document Released: 02/10/2005 Document Revised: 06/27/2013 Document Reviewed: 09/09/2011 [facility] Patient Information 2015 [place], [place]. This information is not intended to replace advice given to you by your health care provider. Make  sure you discuss any questions you have with your health care provider.

## 2014-07-03 NOTE — ED Notes (Signed)
Pt  Reports    Symptoms      Symptoms  Of  Fatigue          Weakness  X  sev  Weeks        denys  Any  Pain         Pt     denys  Any  Vomiting /  Bleeding or  Any  Other   Symptoms         Pt  Seen  By pcp     Around that  Time  And  Was  Treated  For  Bronchitis

## 2014-07-04 ENCOUNTER — Encounter: Payer: Self-pay | Admitting: Family Medicine

## 2014-07-04 ENCOUNTER — Ambulatory Visit (INDEPENDENT_AMBULATORY_CARE_PROVIDER_SITE_OTHER): Payer: Medicare HMO | Admitting: Family Medicine

## 2014-07-04 VITALS — BP 122/70 | HR 78 | Temp 98.9°F | Resp 18 | Ht 70.0 in | Wt 264.0 lb

## 2014-07-04 DIAGNOSIS — R55 Syncope and collapse: Secondary | ICD-10-CM

## 2014-07-04 DIAGNOSIS — R531 Weakness: Secondary | ICD-10-CM

## 2014-07-04 NOTE — Progress Notes (Signed)
Subjective:    Patient ID: James Mcclain, male    DOB: 1945-10-19, 69 y.o.   MRN: [id number]  HPI  Over the last couple of months, the patient has had fluctuating weakness. He will be feeling fine and then suddenly feel very weak. Yesterday he became so weak he felt like he was going to pass out. He went to the urgent care where they found glucosuria and a serum blood sugar of 250 and attributed his weakness to poorly controlled diabetes coupled with significant social stress the patient is in doing. Patient is caring for his wife who is in a nursing facility and is also caring for his 33-year-old grandson. He is under tremendous stress and freely admits that he has not been caring for himself very well. They recommended that he follow-up with me today to discuss better options treat his diabetes. Patient recently had an echocardiogram of his heart performed in March that showed an ejection fraction of 65% and no evidence of heart failure or valvular abnormalities. Patient admits that he is not checking his blood sugar. He is not sure if he is having hypoglycemic episodes. He is not sure if his blood pressure could be fluctuating as he has not checked his blood sugar nor his blood pressure when he has one of his weak spells. He denies feeling any palpitations when he becomes weak. Past Medical History  Diagnosis Date  . Arthritis   . Essential hypertension   . Elevated PSA   . Coronary artery disease     a. s/p Xience DES to the LAD 07/2012.  . Prostate cancer 07/15/12  . Insulin dependent diabetes mellitus   . Stroke     a. Cryptogenic, 09/2013, event monitor with NSR. Small PFO noted on echo but neg LE duplex.  [name] PVC's (premature ventricular contractions)   . PFO (patent foramen ovale)     a. By echo 09/2013.  . Dilated aortic root     a. Mildly dilated by echo 09/2013, f/u MRA scheduled for 12/2014.  [name] Hyperlipidemia   . Multifocal atrial tachycardia     a. On tele 09/2013.  [name] Polycythemia     Past Surgical History  Procedure Laterality Date  . Kidney stones      2006  . Tonsillectomy      as adult  . Total knee arthroplasty  01/24/2011    Procedure: TOTAL KNEE ARTHROPLASTY;  Surgeon: Alta Corning;  Location: [place];  Service: Orthopedics;  Laterality: Left;  COMPUTER ASSISTED LEFT  TOTAL KNEE REPLACEMENT. Anesthesia a combination of regional and general.  . Coronary angioplasty with stent placement  08/05/2012    LAD, 1 stent  . Prostate biopsy  07/15/2012  . Tee without cardioversion N/A 09/27/2013    Procedure: TRANSESOPHAGEAL ECHOCARDIOGRAM (TEE);  Surgeon: Larey Dresser, MD;  Location: [facility] ENDOSCOPY;  Service: Cardiovascular;  Laterality: N/A;  . Percutaneous coronary stent intervention (pci-s) N/A 08/05/2012    Procedure: PERCUTANEOUS CORONARY STENT INTERVENTION (PCI-S);  Surgeon: Sherren Mocha, MD;  Location: [facility] C[facility] LAB;  Service: Cardiovascular;  Laterality: N/A;   Current Outpatient Prescriptions on File Prior to Visit  Medication Sig Dispense Refill  . allopurinol (ZYLOPRIM) 100 MG tablet TAKE TWO TABLETS BY MOUTH ONCE DAILY 30 tablet 11  . amLODipine (NORVASC) 10 MG tablet Take 10 mg by mouth daily.    [name] amLODipine (NORVASC) 10 MG tablet TAKE ONE TABLET BY MOUTH ONCE DAILY 30 tablet 5  . aspirin EC 81 MG  tablet Take 1 tablet (81 mg total) by mouth daily.    [name] atorvastatin (LIPITOR) 40 MG tablet Take 1 tablet (40 mg total) by mouth daily at 6 PM. 90 tablet 1  . azithromycin (ZITHROMAX Z-PAK) 250 MG tablet 2 pills on day 1, 1 pill on days 2-5 6 each 0  . carvedilol (COREG) 12.5 MG tablet TAKE ONE TABLET BY MOUTH TWICE DAILY 60 tablet 5  . Cholecalciferol (VITAMIN D3) 5000 UNITS CAPS Take 10,000 Units by mouth daily.    . clopidogrel (PLAVIX) 75 MG tablet TAKE ONE TABLET BY MOUTH ONCE DAILY WITH BREAKFAST 30 tablet 6  . insulin glargine (LANTUS) 100 units/mL SOLN Inject 45 Units into the skin at bedtime.     [name] losartan-hydrochlorothiazide (HYZAAR) 100-25 MG per  tablet TAKE ONE TABLET BY MOUTH ONCE DAILY 90 tablet 0  . magnesium oxide (MAG-OX) 400 MG tablet Take 1 tablet (400 mg total) by mouth 2 (two) times daily. 60 tablet 6  . metFORMIN (GLUCOPHAGE) 1000 MG tablet 1 tab po qam & 1/2 tab po qpm    . predniSONE (DELTASONE) 20 MG tablet 3 tabs poqday 1-2, 2 tabs poqday 3-4, 1 tab poqday 5-6 12 tablet 0  . vitamin E 1000 UNIT capsule Take 1,000 Units by mouth daily.     No current facility-administered medications on file prior to visit.   Allergies  Allergen Reactions  . Fish Allergy Swelling   History   Social History  . Marital Status: Married    Spouse Name: N/A  . Number of Children: 3  . Years of Education: 12th   Occupational History  . retired    Social History Main Topics  . Smoking status: Never Smoker   . Smokeless tobacco: Never Used  . Alcohol Use: No  . Drug Use: No  . Sexual Activity: Not Currently   Other Topics Concern  . Not on file   Social History Narrative   [name]   Patient is right handed   Patient drinks coffee and sodas     Review of Systems  Constitutional: Positive for fatigue. Negative for fever, chills, diaphoresis, activity change, appetite change and unexpected weight change.  HENT: Positive for congestion, ear pain, rhinorrhea and sinus pressure.   Eyes: Negative.   Respiratory: Negative for apnea, cough, choking, chest tightness, shortness of breath, wheezing and stridor.   Cardiovascular: Negative for chest pain, palpitations and leg swelling.  Gastrointestinal: Negative for abdominal distention.  Endocrine: Positive for polydipsia and polyuria.  Genitourinary: Negative.   Neurological: Positive for weakness. Negative for dizziness, tremors, seizures, syncope, facial asymmetry, speech difficulty, light-headedness, numbness and headaches.       Objective:   Physical Exam  Constitutional: He is oriented to person, place, and time. He appears well-developed and  well-nourished.  Cardiovascular: Normal rate, regular rhythm, normal heart sounds and intact distal pulses.  Exam reveals no gallop and no friction rub.   No murmur heard. Pulmonary/Chest: Effort normal and breath sounds normal. No respiratory distress. He has no wheezes. He has no rales. He exhibits no tenderness.  Abdominal: Soft. Bowel sounds are normal. He exhibits no distension and no mass. There is no tenderness. There is no rebound and no guarding.  Musculoskeletal: He exhibits no edema.  Neurological: He is alert and oriented to person, place, and time. He has normal reflexes. He displays normal reflexes. No cranial nerve deficit. He exhibits normal muscle tone. Coordination normal.  Vitals reviewed.  Assessment & Plan:  Weakness - Plan: CBC with Differential/Platelet, COMPLETE METABOLIC PANEL WITH GFR, Hemoglobin A1c  Near syncope - Plan: CBC with Differential/Platelet, COMPLETE METABOLIC PANEL WITH GFR, Hemoglobin A1c  There are many possible causes for the patient's symptoms. Patient could be having rapidly fluctuating blood sugars and hypoglycemic episodes. He could be having orthostatic hypotension. He can also be having anxiety. There is also the possibility of cardiac arrhythmias causing presyncope and weakness. Therefore I have recommended that the patient check his blood sugar 3 times a day, fasting in the morning, after lunch, and after dinner. Also want him checking his blood pressure 3 times a day. I have asked the patient to return to see me on Friday and we will go over these numbers to see if fluctuations in his blood pressure or in his blood sugar could potentially be causing his problems. I will check a hemoglobin A1c and I'll do my best to try to better manage his diabetes. At the present time I'm concerned he may be having episodes of hypoglycemia coupled with episodes of hypoglycemia causing these spells. I will also have the patient wear a 24-hour cardiac  monitor to evaluate for any potential arrhythmias. I will check a CBC, CMP, and hemoglobin A1c as well. Recheck on Friday

## 2014-07-05 ENCOUNTER — Ambulatory Visit: Payer: Medicare HMO | Admitting: Family Medicine

## 2014-07-05 DIAGNOSIS — R55 Syncope and collapse: Secondary | ICD-10-CM

## 2014-07-05 DIAGNOSIS — R531 Weakness: Secondary | ICD-10-CM

## 2014-07-05 LAB — CBC WITH DIFFERENTIAL/PLATELET
Basophils Absolute: 0 10*3/uL (ref 0.0–0.1)
Basophils Relative: 0 % (ref 0–1)
Eosinophils Absolute: 0.3 10*3/uL (ref 0.0–0.7)
Eosinophils Relative: 4 % (ref 0–5)
HCT: 49.7 % (ref 39.0–52.0)
Hemoglobin: 16.7 g/dL (ref 13.0–17.0)
Lymphocytes Relative: 29 % (ref 12–46)
Lymphs Abs: 2.5 10*3/uL (ref 0.7–4.0)
MCH: 30.3 pg (ref 26.0–34.0)
MCHC: 33.6 g/dL (ref 30.0–36.0)
MCV: 90 fL (ref 78.0–100.0)
MPV: 10 fL (ref 8.6–12.4)
Monocytes Absolute: 0.7 10*3/uL (ref 0.1–1.0)
Monocytes Relative: 8 % (ref 3–12)
Neutro Abs: 5.1 10*3/uL (ref 1.7–7.7)
Neutrophils Relative %: 59 % (ref 43–77)
Platelets: 277 10*3/uL (ref 150–400)
RBC: 5.52 MIL/uL (ref 4.22–5.81)
RDW: 15.6 % — ABNORMAL HIGH (ref 11.5–15.5)
WBC: 8.6 10*3/uL (ref 4.0–10.5)

## 2014-07-05 LAB — COMPLETE METABOLIC PANEL WITH GFR
ALT: 19 U/L (ref 0–53)
AST: 16 U/L (ref 0–37)
Albumin: 3.7 g/dL (ref 3.5–5.2)
Alkaline Phosphatase: 68 U/L (ref 39–117)
BUN: 18 mg/dL (ref 6–23)
CO2: 29 mEq/L (ref 19–32)
Calcium: 9.3 mg/dL (ref 8.4–10.5)
Chloride: 103 mEq/L (ref 96–112)
Creat: 1.18 mg/dL (ref 0.50–1.35)
GFR, Est African American: 73 mL/min
GFR, Est Non African American: 63 mL/min
Glucose, Bld: 157 mg/dL — ABNORMAL HIGH (ref 70–99)
Potassium: 4.3 mEq/L (ref 3.5–5.3)
Sodium: 141 mEq/L (ref 135–145)
Total Bilirubin: 0.8 mg/dL (ref 0.2–1.2)
Total Protein: 6.7 g/dL (ref 6.0–8.3)

## 2014-07-05 LAB — HEMOGLOBIN A1C
Hgb A1c MFr Bld: 8.3 % — ABNORMAL HIGH (ref <5.7)
Mean Plasma Glucose: 192 mg/dL — ABNORMAL HIGH (ref <117)

## 2014-07-05 NOTE — Patient Instructions (Signed)
Holter Monitor removed and [facility] called for pick up

## 2014-07-11 ENCOUNTER — Other Ambulatory Visit (HOSPITAL_BASED_OUTPATIENT_CLINIC_OR_DEPARTMENT_OTHER): Payer: Medicare HMO

## 2014-07-11 ENCOUNTER — Telehealth: Payer: Self-pay | Admitting: Oncology

## 2014-07-11 ENCOUNTER — Encounter: Payer: Self-pay | Admitting: Family Medicine

## 2014-07-11 ENCOUNTER — Ambulatory Visit (HOSPITAL_BASED_OUTPATIENT_CLINIC_OR_DEPARTMENT_OTHER): Payer: Medicare HMO

## 2014-07-11 VITALS — BP 119/70 | HR 74 | Temp 98.4°F | Resp 19

## 2014-07-11 DIAGNOSIS — D751 Secondary polycythemia: Secondary | ICD-10-CM

## 2014-07-11 DIAGNOSIS — I63412 Cerebral infarction due to embolism of left middle cerebral artery: Secondary | ICD-10-CM

## 2014-07-11 LAB — CBC WITH DIFFERENTIAL/PLATELET
BASO%: 0.3 % (ref 0.0–2.0)
Basophils Absolute: 0 10*3/uL (ref 0.0–0.1)
EOS%: 4.2 % (ref 0.0–7.0)
Eosinophils Absolute: 0.3 10*3/uL (ref 0.0–0.5)
HCT: 55.9 % — ABNORMAL HIGH (ref 38.4–49.9)
HGB: 17.8 g/dL — ABNORMAL HIGH (ref 13.0–17.1)
LYMPH%: 22.7 % (ref 14.0–49.0)
MCH: 31 pg (ref 27.2–33.4)
MCHC: 31.8 g/dL — ABNORMAL LOW (ref 32.0–36.0)
MCV: 97.4 fL (ref 79.3–98.0)
MONO#: 0.7 10*3/uL (ref 0.1–0.9)
MONO%: 8.8 % (ref 0.0–14.0)
NEUT#: 5 10*3/uL (ref 1.5–6.5)
NEUT%: 64 % (ref 39.0–75.0)
Platelets: 239 10*3/uL (ref 140–400)
RBC: 5.74 10*6/uL (ref 4.20–5.82)
RDW: 15.3 % — ABNORMAL HIGH (ref 11.0–14.6)
WBC: 7.9 10*3/uL (ref 4.0–10.3)
lymph#: 1.8 10*3/uL (ref 0.9–3.3)
nRBC: 0 % (ref 0–0)

## 2014-07-11 NOTE — Progress Notes (Signed)
Phlebotomy 548cc pulled from pt with 16g PIV to right AC  Pt given snack and encouraged fluids. Observed pt x 30 mins afterwards.

## 2014-07-11 NOTE — Patient Instructions (Signed)

## 2014-07-11 NOTE — Telephone Encounter (Signed)
Left a message for patient to call to get a new schedule,out of office 7/12

## 2014-07-13 ENCOUNTER — Ambulatory Visit (INDEPENDENT_AMBULATORY_CARE_PROVIDER_SITE_OTHER): Payer: Medicare HMO | Admitting: Cardiology

## 2014-07-13 ENCOUNTER — Encounter: Payer: Self-pay | Admitting: Cardiology

## 2014-07-13 VITALS — BP 130/82 | HR 68 | Ht 70.0 in | Wt 263.0 lb

## 2014-07-13 DIAGNOSIS — I77810 Thoracic aortic ectasia: Secondary | ICD-10-CM

## 2014-07-13 DIAGNOSIS — Z8673 Personal history of transient ischemic attack (TIA), and cerebral infarction without residual deficits: Secondary | ICD-10-CM | POA: Diagnosis not present

## 2014-07-13 DIAGNOSIS — E785 Hyperlipidemia, unspecified: Secondary | ICD-10-CM

## 2014-07-13 DIAGNOSIS — I2510 Atherosclerotic heart disease of native coronary artery without angina pectoris: Secondary | ICD-10-CM

## 2014-07-13 NOTE — Patient Instructions (Addendum)
Medication Instructions:  No changes today.  Labwork: None today  Testing/Procedures: Schedule an appointment for an MRA of your chest in AUGUST 2016. You will need to have lab within 4 weeks of  this appointment--BMET.  Follow-Up: Your physician wants you to follow-up in: 6 months with Dr Aundra Dubin. (November 2016).You will receive a reminder letter in the mail two months in advance. If you don't receive a letter, please call our office to schedule the follow-up appointment.   Any Other Special Instructions Will Be Listed Below (If Applicable).  You have been referred to Dr Cristopher Peru for evaluation for [facility] device placement.

## 2014-07-13 NOTE — Progress Notes (Signed)
Patient ID: James Mcclain, male   DOB: 1945-12-06, 69 y.o.   MRN: [id number] PCP: Dr. Dennard Schaumann  69 yo with history of type II diabetes, HTN, CKD, paroxysmal atrial fibrillation, CVA, and CAD s/p PCI presents for cardiology followup. At initial visit in 5/14, he reported significant new exertional dyspnea.  I set him up for ETT-Sestamibi. This showed a hypertensive BP response with extreme dyspnea.  There was a fixed inferior defect with EF 56%.  Echo showed moderate LVH with EF 55-60%.  Patient continues to have the same severe dyspnea with exertion.  Given ongoing symptoms, I did a left heart cath in 6/14 which showed 90% proximal LAD stenosis.  This was treated with Xience DES.    In 8/15, he had a CVA treated with TPA.  Event monitor in 8/15 did not show atrial fibrillation.  Last echo in 3/16 showed EF 60-65%.    For the last couple of months, he has been doing well.  No dyspnea walking on flat ground, mild dyspnea/fatigue walking up a hill.  No chest pain.  No orthopnea/PND.  He does not feel palpitations.  No claudication.    Labs (3/14): K 4.5, creatinine 1.47, TSH normal, HCT 51.2, LDL 154, HDL 37 Labs (6/14): K 3.6, creatinine 1.4 Labs (7/14): LDL 54, HDL 30 Labs (3/15): K 4.5, creatinine 1.42, HCT 51.3, LDL 79, HDL 38 Labs (2/16): LDL 62, HDL 34 Labs (5/16): HCT 55.9, K 4.3, creatinine 1.18  PMH: 1. HTN 2. Type II diabetes 3. Gout 4. OA s/p TKR 5. CKD: Suspect diabetic nephropathy 6. Hyperlipidemia 7. Prostate cancer: Diagnosed 5/14.  8. CAD: Echo (5/14) with EF 55-60%, moderate LVH.  ETT-Sestamibi with hypertensive BP response, severe dyspnea, inferior fixed defect with EF 56%.  Echo with moderate LVH, EF 55-60%.  LHC (6/14) with 90% pLAD stenosis; this was treated with Xience DES.  Echo (3/16) with EF 60-65%.  9. CVA: 8/15, left frontal and temporal lobe involvement, treated with TPA.  8/15 event monitor showed no atrial fibrillation. 10. Ascending aorta aneurysm: TEE (8/15) with  4.4 cm ascending aorta.  11. PFO: Small PFO seen by TEE in in 8/15.   SH: Married, retired Recruitment consultant, lives in [place].  Nonsmoker.  FH: Brother with CHF, father with "enlarged heart"  ROS: All systems reviewed and negative except as per HPI.   Current Outpatient Prescriptions  Medication Sig Dispense Refill  . allopurinol (ZYLOPRIM) 100 MG tablet TAKE TWO TABLETS BY MOUTH ONCE DAILY 30 tablet 11  . amLODipine (NORVASC) 10 MG tablet TAKE ONE TABLET BY MOUTH ONCE DAILY 30 tablet 5  . aspirin EC 81 MG tablet Take 1 tablet (81 mg total) by mouth daily.    [name] atorvastatin (LIPITOR) 40 MG tablet Take 1 tablet (40 mg total) by mouth daily at 6 PM. 90 tablet 1  . carvedilol (COREG) 12.5 MG tablet TAKE ONE TABLET BY MOUTH TWICE DAILY 60 tablet 5  . Cholecalciferol (VITAMIN D3) 5000 UNITS CAPS Take 10,000 Units by mouth daily.    . clopidogrel (PLAVIX) 75 MG tablet TAKE ONE TABLET BY MOUTH ONCE DAILY WITH BREAKFAST 30 tablet 6  . insulin glargine (LANTUS) 100 units/mL SOLN Inject 45 Units into the skin at bedtime.     [name] losartan-hydrochlorothiazide (HYZAAR) 100-25 MG per tablet TAKE ONE TABLET BY MOUTH ONCE DAILY 90 tablet 0  . magnesium oxide (MAG-OX) 400 MG tablet Take 1 tablet (400 mg total) by mouth 2 (two) times daily. 60 tablet 6  .  metFORMIN (GLUCOPHAGE) 1000 MG tablet 1 tab po qam & 1/2 tab po qpm    . vitamin E 1000 UNIT capsule Take 1,000 Units by mouth daily.     No current facility-administered medications for this visit.   BP 130/82 mmHg  Pulse 68  Ht 5\' 10"  (1.778 m)  Wt 263 lb (119.296 kg)  BMI 37.74 kg/m2 General: NAD, obese Neck: No JVD, no thyromegaly or thyroid nodule.  Lungs: Clear to auscultation bilaterally with normal respiratory effort. CV: Nondisplaced PMI.  Heart regular S1/S2, +S4, 2/6 early SEM RUSB.  1+ ankle edema.  No carotid bruit.  Normal pedal pulses.  Abdomen: Soft, nontender, no hepatosplenomegaly, no distention.  Neurologic: Alert and oriented x 3.   Psych: Normal affect. Extremities: No clubbing or cyanosis.   Assessment/Plan: 1. CAD: s/p PCI to proximal LAD.  No chest pain. He will continue ASA 81, atorvastatin, Coreg, and ARB.  I will have him continue Plavix long-term if he has no bleeding complications, especially given CVA.   2. HTN: BP controlled, continue current meds.  3. Hyperlipidemia: Good lipids in 2/16.  Continue statin.     4. CVA: Cryptogenic CVA in 8/15.  Has small PFO but doubt this explains.  Concern for paroxysmal atrial fibrillation, has numerous RFs for atrial fibrillation.  I am going to have him see Dr Lovena Le to discuss placement of loop recorder to look for atrial fibrillation.  5. Ascending aortic aneurysm: 4.4 cm on TEE in 8/15.  Need to keep BP controlled.  Will get MRA chest in 8/16 to follow.    Followup in 6 months.    Loralie Champagne 07/13/2014

## 2014-07-14 ENCOUNTER — Other Ambulatory Visit: Payer: Self-pay | Admitting: Family Medicine

## 2014-07-18 ENCOUNTER — Encounter: Payer: Self-pay | Admitting: Cardiology

## 2014-07-20 ENCOUNTER — Other Ambulatory Visit: Payer: Self-pay

## 2014-07-26 ENCOUNTER — Encounter: Payer: Self-pay | Admitting: Family Medicine

## 2014-07-26 ENCOUNTER — Encounter: Payer: Self-pay | Admitting: Internal Medicine

## 2014-07-26 ENCOUNTER — Ambulatory Visit (INDEPENDENT_AMBULATORY_CARE_PROVIDER_SITE_OTHER): Payer: Medicare HMO | Admitting: Internal Medicine

## 2014-07-26 VITALS — BP 124/82 | HR 71 | Ht 70.0 in | Wt 261.6 lb

## 2014-07-26 DIAGNOSIS — R0609 Other forms of dyspnea: Secondary | ICD-10-CM

## 2014-07-26 DIAGNOSIS — R0600 Dyspnea, unspecified: Secondary | ICD-10-CM

## 2014-07-26 DIAGNOSIS — I493 Ventricular premature depolarization: Secondary | ICD-10-CM

## 2014-07-26 DIAGNOSIS — I200 Unstable angina: Secondary | ICD-10-CM

## 2014-07-26 DIAGNOSIS — Z01812 Encounter for preprocedural laboratory examination: Secondary | ICD-10-CM | POA: Diagnosis not present

## 2014-07-26 DIAGNOSIS — I10 Essential (primary) hypertension: Secondary | ICD-10-CM

## 2014-07-26 DIAGNOSIS — I639 Cerebral infarction, unspecified: Secondary | ICD-10-CM

## 2014-07-26 NOTE — Assessment & Plan Note (Signed)
His symptoms are well controlled. He will continue his current meds.

## 2014-07-26 NOTE — Progress Notes (Signed)
HPI James Mcclain is a 69 yo man with a h/o CAD, aortic root problems, unexplained stroke and HTN is referred today by Dr. Aundra Dubin for consideration of insertion of an ILR . The patient does not have palpitations. No significant deficits from his stroke except for some difficulty with speech. He has not had a diagnosis of either atrial fib or flutter.  Allergies  Allergen Reactions  . Fish Allergy Swelling     Current Outpatient Prescriptions  Medication Sig Dispense Refill  . allopurinol (ZYLOPRIM) 100 MG tablet TAKE TWO TABLETS BY MOUTH ONCE DAILY 30 tablet 11  . amLODipine (NORVASC) 10 MG tablet TAKE ONE TABLET BY MOUTH ONCE DAILY 30 tablet 5  . aspirin EC 81 MG tablet Take 1 tablet (81 mg total) by mouth daily.    [name] atorvastatin (LIPITOR) 40 MG tablet Take 1 tablet (40 mg total) by mouth daily at 6 PM. 90 tablet 1  . carvedilol (COREG) 12.5 MG tablet TAKE ONE TABLET BY MOUTH TWICE DAILY 60 tablet 5  . Cholecalciferol (VITAMIN D3) 5000 UNITS CAPS Take 10,000 Units by mouth daily.    . clopidogrel (PLAVIX) 75 MG tablet TAKE ONE TABLET BY MOUTH ONCE DAILY WITH BREAKFAST 30 tablet 6  . insulin glargine (LANTUS) 100 units/mL SOLN Inject 45 Units into the skin at bedtime.     [name] losartan-hydrochlorothiazide (HYZAAR) 100-25 MG per tablet TAKE ONE TABLET BY MOUTH ONCE DAILY 90 tablet 0  . magnesium oxide (MAG-OX) 400 MG tablet Take 1 tablet (400 mg total) by mouth 2 (two) times daily. 60 tablet 6  . metFORMIN (GLUCOPHAGE) 1000 MG tablet 1 tab by mouth in the am & 1/2 tab by mouth in the pm    . vitamin E 1000 UNIT capsule Take 1,000 Units by mouth daily.     No current facility-administered medications for this visit.     Past Medical History  Diagnosis Date  . Arthritis   . Essential hypertension   . Elevated PSA   . Coronary artery disease     a. s/p Xience DES to the LAD 07/2012.  . Prostate cancer 07/15/12  . Insulin dependent diabetes mellitus   . Stroke     a. Cryptogenic,  09/2013, event monitor with NSR. Small PFO noted on echo but neg LE duplex.  [name] PVC's (premature ventricular contractions)   . PFO (patent foramen ovale)     a. By echo 09/2013.  . Dilated aortic root     a. Mildly dilated by echo 09/2013, f/u MRA scheduled for 12/2014.  [name] Hyperlipidemia   . Multifocal atrial tachycardia     a. On tele 09/2013.  [name] Polycythemia     ROS:   All systems reviewed and negative except as noted in the HPI.   Past Surgical History  Procedure Laterality Date  . Kidney stones      2006  . Tonsillectomy      as adult  . Total knee arthroplasty  01/24/2011    Procedure: TOTAL KNEE ARTHROPLASTY;  Surgeon: Alta Corning;  Location: [place];  Service: Orthopedics;  Laterality: Left;  COMPUTER ASSISTED LEFT  TOTAL KNEE REPLACEMENT. Anesthesia a combination of regional and general.  . Coronary angioplasty with stent placement  08/05/2012    LAD, 1 stent  . Prostate biopsy  07/15/2012  . Tee without cardioversion N/A 09/27/2013    Procedure: TRANSESOPHAGEAL ECHOCARDIOGRAM (TEE);  Surgeon: Larey Dresser, MD;  Location: [place];  Service: Cardiovascular;  Laterality: N/A;  . Percutaneous coronary stent intervention (pci-s) N/A 08/05/2012    Procedure: PERCUTANEOUS CORONARY STENT INTERVENTION (PCI-S);  Surgeon: Sherren Mocha, MD;  Location: [facility] CATH LAB;  Service: Cardiovascular;  Laterality: N/A;     Family History  Problem Relation Age of Onset  . Leukemia Mother   . Heart Problems Father   . Heart Problems      family history  . Diabetes Sister   . Heart Problems Brother   . Heart attack Neg Hx   . Stroke    . Stroke Brother   . Hypertension Brother   . Cancer Mother      History   Social History  . Marital Status: Married    Spouse Name: N/A  . Number of Children: 3  . Years of Education: 12th   Occupational History  . retired    Social History Main Topics  . Smoking status: Never Smoker   . Smokeless tobacco: Never Used  . Alcohol Use: No    . Drug Use: No  . Sexual Activity: Not Currently   Other Topics Concern  . Not on file   Social History Narrative   [name]   Patient is right handed   Patient drinks coffee and sodas     BP 124/82 mmHg  Pulse 71  Ht 5\' 10"  (1.778 m)  Wt 261 lb 9.6 oz (118.661 kg)  BMI 37.54 kg/m2  Physical Exam:  Well appearing 69 yo man, NAD HEENT: Unremarkable Neck:  No JVD, no thyromegally Back:  No CVA tenderness Lungs:  Clear with no wheezes HEART:  Regular rate rhythm, no murmurs, no rubs, no clicks Abd:  soft, positive bowel sounds, no organomegally, no rebound, no guarding Ext:  2 plus pulses, no edema, no cyanosis, no clubbing Skin:  No rashes no nodules Neuro:  CN II through XII intact, motor grossly intact  EKG - nsr    Assess/Plan:

## 2014-07-26 NOTE — Patient Instructions (Signed)
Medication Instructions:  Your physician recommends that you continue on your current medications as directed. Please refer to the Current Medication list given to you today.   Labwork: Your physician recommends that you return for lab work in: 6/22 (BMET, CBC)   Testing/Procedures: NONE  Follow-Up: I will call you with the details of your follow up appointments. After your Loop Recorder placement.    Any Other Special Instructions Will Be Listed Below (If Applicable).

## 2014-07-26 NOTE — Assessment & Plan Note (Signed)
His blood pressure is well controlled. No change in meds.  

## 2014-07-26 NOTE — Assessment & Plan Note (Signed)
The etiology of his stroke is unclear. He will undergo insertion of an ILR. Will follow.

## 2014-08-01 ENCOUNTER — Encounter: Payer: Self-pay | Admitting: Family Medicine

## 2014-08-01 ENCOUNTER — Ambulatory Visit (INDEPENDENT_AMBULATORY_CARE_PROVIDER_SITE_OTHER): Payer: Medicare HMO | Admitting: Family Medicine

## 2014-08-01 VITALS — BP 122/64 | HR 84 | Temp 98.4°F | Resp 16 | Ht 70.0 in | Wt 266.0 lb

## 2014-08-01 DIAGNOSIS — Z794 Long term (current) use of insulin: Secondary | ICD-10-CM | POA: Diagnosis not present

## 2014-08-01 DIAGNOSIS — E119 Type 2 diabetes mellitus without complications: Secondary | ICD-10-CM | POA: Diagnosis not present

## 2014-08-01 DIAGNOSIS — IMO0001 Reserved for inherently not codable concepts without codable children: Secondary | ICD-10-CM

## 2014-08-01 MED ORDER — SITAGLIPTIN PHOSPHATE 100 MG PO TABS: 100.0000 mg | ORAL_TABLET | Freq: Every day | ORAL | Status: DC

## 2014-08-01 NOTE — Progress Notes (Signed)
Subjective:    Patient ID: James Mcclain, male    DOB: 1945-11-01, 69 y.o.   MRN: [id number]  HPI  07/04/14 Over the last couple of months, the patient has had fluctuating weakness. He will be feeling fine and then suddenly feel very weak. Yesterday he became so weak he felt like he was going to pass out. He went to the urgent care where they found glucosuria and a serum blood sugar of 250 and attributed his weakness to poorly controlled diabetes coupled with significant social stress the patient is in doing. Patient is caring for his wife who is in a nursing facility and is also caring for his 23-year-old grandson. He is under tremendous stress and freely admits that he has not been caring for himself very well. They recommended that he follow-up with me today to discuss better options treat his diabetes. Patient recently had an echocardiogram of his heart performed in March that showed an ejection fraction of 65% and no evidence of heart failure or valvular abnormalities. Patient admits that he is not checking his blood sugar. He is not sure if he is having hypoglycemic episodes. He is not sure if his blood pressure could be fluctuating as he has not checked his blood sugar nor his blood pressure when he has one of his weak spells. He denies feeling any palpitations when he becomes weak.  At that time, my plan was: There are many possible causes for the patient's symptoms. Patient could be having rapidly fluctuating blood sugars and hypoglycemic episodes. He could be having orthostatic hypotension. He can also be having anxiety. There is also the possibility of cardiac arrhythmias causing presyncope and weakness. Therefore I have recommended that the patient check his blood sugar 3 times a day, fasting in the morning, after lunch, and after dinner. Also want him checking his blood pressure 3 times a day. I have asked the patient to return to see me on Friday and we will go over these numbers to see if  fluctuations in his blood pressure or in his blood sugar could potentially be causing his problems. I will check a hemoglobin A1c and I'll do my best to try to better manage his diabetes. At the present time I'm concerned he may be having episodes of hypoglycemia coupled with episodes of hypoglycemia causing these spells. I will also have the patient wear a 24-hour cardiac monitor to evaluate for any potential arrhythmias. I will check a CBC, CMP, and hemoglobin A1c as well. Recheck on Friday  08/01/14  Patient did not return as we discussed.   Event monitor revealed brief runs of SVT but no concerning arrhythmias. Patient's blood pressure has been stable. He is not checking his blood sugar. Patient states that he feels much better. His wife is about to come home from SNF. Other family members are helping him care for his grandson. Since he has been able to remove some of the stress in his life his weakness has improved. Hemoglobin A1c was found to be 8.3. The remainder of his lab work was normal. Past Medical History  Diagnosis Date  . Arthritis   . Essential hypertension   . Elevated PSA   . Coronary artery disease     a. s/p Xience DES to the LAD 07/2012.  . Prostate cancer 07/15/12  . Insulin dependent diabetes mellitus   . Stroke     a. Cryptogenic, 09/2013, event monitor with NSR. Small PFO noted on echo but neg LE duplex.  [name]  PVC's (premature ventricular contractions)   . PFO (patent foramen ovale)     a. By echo 09/2013.  . Dilated aortic root     a. Mildly dilated by echo 09/2013, f/u MRA scheduled for 12/2014.  [name] Hyperlipidemia   . Multifocal atrial tachycardia     a. On tele 09/2013.  [name] Polycythemia    Past Surgical History  Procedure Laterality Date  . Kidney stones      2006  . Tonsillectomy      as adult  . Total knee arthroplasty  01/24/2011    Procedure: TOTAL KNEE ARTHROPLASTY;  Surgeon: Alta Corning;  Location: [place];  Service: Orthopedics;  Laterality: Left;  COMPUTER  ASSISTED LEFT  TOTAL KNEE REPLACEMENT. Anesthesia a combination of regional and general.  . Coronary angioplasty with stent placement  08/05/2012    LAD, 1 stent  . Prostate biopsy  07/15/2012  . Tee without cardioversion N/A 09/27/2013    Procedure: TRANSESOPHAGEAL ECHOCARDIOGRAM (TEE);  Surgeon: Larey Dresser, MD;  Location: [facility] ENDOSCOPY;  Service: Cardiovascular;  Laterality: N/A;  . Percutaneous coronary stent intervention (pci-s) N/A 08/05/2012    Procedure: PERCUTANEOUS CORONARY STENT INTERVENTION (PCI-S);  Surgeon: Sherren Mocha, MD;  Location: [facility] CATH LAB;  Service: Cardiovascular;  Laterality: N/A;   Current Outpatient Prescriptions on File Prior to Visit  Medication Sig Dispense Refill  . allopurinol (ZYLOPRIM) 100 MG tablet TAKE TWO TABLETS BY MOUTH ONCE DAILY 30 tablet 11  . amLODipine (NORVASC) 10 MG tablet TAKE ONE TABLET BY MOUTH ONCE DAILY 30 tablet 5  . aspirin EC 81 MG tablet Take 1 tablet (81 mg total) by mouth daily.    [name] atorvastatin (LIPITOR) 40 MG tablet Take 1 tablet (40 mg total) by mouth daily at 6 PM. 90 tablet 1  . carvedilol (COREG) 12.5 MG tablet TAKE ONE TABLET BY MOUTH TWICE DAILY 60 tablet 5  . Cholecalciferol (VITAMIN D3) 5000 UNITS CAPS Take 10,000 Units by mouth daily.    . clopidogrel (PLAVIX) 75 MG tablet TAKE ONE TABLET BY MOUTH ONCE DAILY WITH BREAKFAST 30 tablet 6  . insulin glargine (LANTUS) 100 units/mL SOLN Inject 45 Units into the skin at bedtime.     [name] losartan-hydrochlorothiazide (HYZAAR) 100-25 MG per tablet TAKE ONE TABLET BY MOUTH ONCE DAILY 90 tablet 0  . magnesium oxide (MAG-OX) 400 MG tablet Take 1 tablet (400 mg total) by mouth 2 (two) times daily. 60 tablet 6  . metFORMIN (GLUCOPHAGE) 1000 MG tablet 1 tab by mouth in the am & 1/2 tab by mouth in the pm    . vitamin E 1000 UNIT capsule Take 1,000 Units by mouth daily.     No current facility-administered medications on file prior to visit.   Allergies  Allergen Reactions  . Fish Allergy  Swelling   History   Social History  . Marital Status: Married    Spouse Name: N/A  . Number of Children: 3  . Years of Education: 12th   Occupational History  . retired    Social History Main Topics  . Smoking status: Never Smoker   . Smokeless tobacco: Never Used  . Alcohol Use: No  . Drug Use: No  . Sexual Activity: Not Currently   Other Topics Concern  . Not on file   Social History Narrative   [name]   Patient is right handed   Patient drinks coffee and sodas     Review of Systems  Constitutional: Positive for fatigue. Negative for fever, chills, diaphoresis, activity change, appetite change and unexpected weight change.  HENT: Positive for congestion, ear pain, rhinorrhea and sinus pressure.   Eyes: Negative.   Respiratory: Negative for apnea, cough, choking, chest tightness, shortness of breath, wheezing and stridor.   Cardiovascular: Negative for chest pain, palpitations and leg swelling.  Gastrointestinal: Negative for abdominal distention.  Endocrine: Positive for polydipsia and polyuria.  Genitourinary: Negative.   Neurological: Positive for weakness. Negative for dizziness, tremors, seizures, syncope, facial asymmetry, speech difficulty, light-headedness, numbness and headaches.       Objective:   Physical Exam  Constitutional: He is oriented to person, place, and time. He appears well-developed and well-nourished.  Cardiovascular: Normal rate, regular rhythm, normal heart sounds and intact distal pulses.  Exam reveals no gallop and no friction rub.   No murmur heard. Pulmonary/Chest: Effort normal and breath sounds normal. No respiratory distress. He has no wheezes. He has no rales. He exhibits no tenderness.  Abdominal: Soft. Bowel sounds are normal. He exhibits no distension and no mass. There is no tenderness. There is no rebound and no guarding.  Musculoskeletal: He exhibits no edema.  Neurological: He is alert and  oriented to person, place, and time. He has normal reflexes. No cranial nerve deficit. He exhibits normal muscle tone. Coordination normal.  Vitals reviewed.         Assessment & Plan:  IDDM (insulin dependent diabetes mellitus) - Plan: sitaGLIPtin (JANUVIA) 100 MG tablet   patient's weakness certainly could've been stress related. However I emphasized that we need to treat his diabetes more aggressively. Therefore I will start the patient on Januvia 100 mg by mouth daily. Continue Lantus 45 units a day and metformin 1000 twice a day. Recheck sugars in one month both fasting and 2 hours postprandial. Recheck a hemoglobin A1c in 3 months.

## 2014-08-04 ENCOUNTER — Encounter: Payer: Self-pay | Admitting: Family Medicine

## 2014-08-07 ENCOUNTER — Telehealth: Payer: Self-pay | Admitting: Oncology

## 2014-08-07 ENCOUNTER — Telehealth (HOSPITAL_COMMUNITY): Payer: Self-pay | Admitting: Family Medicine

## 2014-08-07 NOTE — Telephone Encounter (Signed)
s.w. pt and r/s appts due to having another appt on sched....pt ok and aware of new d.t

## 2014-08-08 ENCOUNTER — Other Ambulatory Visit: Payer: Medicare HMO

## 2014-08-09 ENCOUNTER — Ambulatory Visit (HOSPITAL_BASED_OUTPATIENT_CLINIC_OR_DEPARTMENT_OTHER): Payer: Medicare HMO

## 2014-08-09 ENCOUNTER — Other Ambulatory Visit (HOSPITAL_BASED_OUTPATIENT_CLINIC_OR_DEPARTMENT_OTHER): Payer: Medicare HMO

## 2014-08-09 VITALS — BP 153/83 | HR 60 | Temp 97.8°F | Resp 18

## 2014-08-09 DIAGNOSIS — I63412 Cerebral infarction due to embolism of left middle cerebral artery: Secondary | ICD-10-CM

## 2014-08-09 DIAGNOSIS — D751 Secondary polycythemia: Secondary | ICD-10-CM

## 2014-08-09 LAB — CBC WITH DIFFERENTIAL/PLATELET
BASO%: 0.6 % (ref 0.0–2.0)
Basophils Absolute: 0 10*3/uL (ref 0.0–0.1)
EOS%: 5.5 % (ref 0.0–7.0)
Eosinophils Absolute: 0.5 10*3/uL (ref 0.0–0.5)
HCT: 51.3 % — ABNORMAL HIGH (ref 38.4–49.9)
HGB: 16.5 g/dL (ref 13.0–17.1)
LYMPH%: 21 % (ref 14.0–49.0)
MCH: 29.7 pg (ref 27.2–33.4)
MCHC: 32.2 g/dL (ref 32.0–36.0)
MCV: 92.2 fL (ref 79.3–98.0)
MONO#: 0.7 10*3/uL (ref 0.1–0.9)
MONO%: 8.9 % (ref 0.0–14.0)
NEUT#: 5.3 10*3/uL (ref 1.5–6.5)
NEUT%: 64 % (ref 39.0–75.0)
Platelets: 296 10*3/uL (ref 140–400)
RBC: 5.57 10*6/uL (ref 4.20–5.82)
RDW: 16.4 % — ABNORMAL HIGH (ref 11.0–14.6)
WBC: 8.3 10*3/uL (ref 4.0–10.3)
lymph#: 1.7 10*3/uL (ref 0.9–3.3)

## 2014-08-09 NOTE — Progress Notes (Signed)
Phlebotomy competed with 462 ml pulled from patient using 16g PIV to right forearm. Started at 10:08 and ended at 102:2. Patient tolerated procedure well. Patient given snack and encouraged fluids. Observed for 30 minutes afterwards. Vital Signs stable.

## 2014-08-09 NOTE — Patient Instructions (Signed)

## 2014-08-16 ENCOUNTER — Other Ambulatory Visit: Payer: Medicare HMO

## 2014-08-23 ENCOUNTER — Encounter (HOSPITAL_COMMUNITY)
Admission: RE | Disposition: A | Discharge status: Home / Self Care | Payer: Self-pay | Source: Ambulatory Visit | Attending: Internal Medicine

## 2014-08-23 ENCOUNTER — Encounter (HOSPITAL_COMMUNITY): Payer: Self-pay | Admitting: Internal Medicine

## 2014-08-23 ENCOUNTER — Ambulatory Visit (HOSPITAL_COMMUNITY)
Admission: RE | Admit: 2014-08-23 | Discharge: 2014-08-23 | Disposition: A | Discharge status: Home / Self Care | Payer: Medicare HMO | Source: Ambulatory Visit | Attending: Internal Medicine | Admitting: Internal Medicine

## 2014-08-23 DIAGNOSIS — Z823 Family history of stroke: Secondary | ICD-10-CM | POA: Insufficient documentation

## 2014-08-23 DIAGNOSIS — Z7982 Long term (current) use of aspirin: Secondary | ICD-10-CM | POA: Insufficient documentation

## 2014-08-23 DIAGNOSIS — Z96652 Presence of left artificial knee joint: Secondary | ICD-10-CM | POA: Insufficient documentation

## 2014-08-23 DIAGNOSIS — I2510 Atherosclerotic heart disease of native coronary artery without angina pectoris: Secondary | ICD-10-CM | POA: Insufficient documentation

## 2014-08-23 DIAGNOSIS — Z955 Presence of coronary angioplasty implant and graft: Secondary | ICD-10-CM | POA: Insufficient documentation

## 2014-08-23 DIAGNOSIS — I10 Essential (primary) hypertension: Secondary | ICD-10-CM | POA: Insufficient documentation

## 2014-08-23 DIAGNOSIS — Z7902 Long term (current) use of antithrombotics/antiplatelets: Secondary | ICD-10-CM | POA: Diagnosis not present

## 2014-08-23 DIAGNOSIS — Z79899 Other long term (current) drug therapy: Secondary | ICD-10-CM | POA: Insufficient documentation

## 2014-08-23 DIAGNOSIS — M1990 Unspecified osteoarthritis, unspecified site: Secondary | ICD-10-CM | POA: Insufficient documentation

## 2014-08-23 DIAGNOSIS — Z8546 Personal history of malignant neoplasm of prostate: Secondary | ICD-10-CM | POA: Insufficient documentation

## 2014-08-23 DIAGNOSIS — I639 Cerebral infarction, unspecified: Secondary | ICD-10-CM | POA: Insufficient documentation

## 2014-08-23 DIAGNOSIS — E785 Hyperlipidemia, unspecified: Secondary | ICD-10-CM | POA: Insufficient documentation

## 2014-08-23 DIAGNOSIS — Z794 Long term (current) use of insulin: Secondary | ICD-10-CM | POA: Insufficient documentation

## 2014-08-23 DIAGNOSIS — E119 Type 2 diabetes mellitus without complications: Secondary | ICD-10-CM | POA: Insufficient documentation

## 2014-08-23 HISTORY — PX: EP IMPLANTABLE DEVICE: SHX172B

## 2014-08-23 SURGERY — LOOP RECORDER INSERTION

## 2014-08-23 SURGICAL SUPPLY — 2 items
LOOP REVEAL LINQSYS (Prosthesis & Implant Heart) ×1 IMPLANT
PACK LOOP INSERTION (CUSTOM PROCEDURE TRAY) ×2 IMPLANT

## 2014-08-23 NOTE — H&P (View-Only) (Signed)
HPI James Mcclain is a 69 yo man with a h/o CAD, aortic root problems, unexplained stroke and HTN is referred today by Dr. Aundra Dubin for consideration of insertion of an ILR . The patient does not have palpitations. No significant deficits from his stroke except for some difficulty with speech. He has not had a diagnosis of either atrial fib or flutter.  Allergies  Allergen Reactions  . Fish Allergy Swelling     Current Outpatient Prescriptions  Medication Sig Dispense Refill  . allopurinol (ZYLOPRIM) 100 MG tablet TAKE TWO TABLETS BY MOUTH ONCE DAILY 30 tablet 11  . amLODipine (NORVASC) 10 MG tablet TAKE ONE TABLET BY MOUTH ONCE DAILY 30 tablet 5  . aspirin EC 81 MG tablet Take 1 tablet (81 mg total) by mouth daily.    [name] atorvastatin (LIPITOR) 40 MG tablet Take 1 tablet (40 mg total) by mouth daily at 6 PM. 90 tablet 1  . carvedilol (COREG) 12.5 MG tablet TAKE ONE TABLET BY MOUTH TWICE DAILY 60 tablet 5  . Cholecalciferol (VITAMIN D3) 5000 UNITS CAPS Take 10,000 Units by mouth daily.    . clopidogrel (PLAVIX) 75 MG tablet TAKE ONE TABLET BY MOUTH ONCE DAILY WITH BREAKFAST 30 tablet 6  . insulin glargine (LANTUS) 100 units/mL SOLN Inject 45 Units into the skin at bedtime.     [name] losartan-hydrochlorothiazide (HYZAAR) 100-25 MG per tablet TAKE ONE TABLET BY MOUTH ONCE DAILY 90 tablet 0  . magnesium oxide (MAG-OX) 400 MG tablet Take 1 tablet (400 mg total) by mouth 2 (two) times daily. 60 tablet 6  . metFORMIN (GLUCOPHAGE) 1000 MG tablet 1 tab by mouth in the am & 1/2 tab by mouth in the pm    . vitamin E 1000 UNIT capsule Take 1,000 Units by mouth daily.     No current facility-administered medications for this visit.     Past Medical History  Diagnosis Date  . Arthritis   . Essential hypertension   . Elevated PSA   . Coronary artery disease     a. s/p Xience DES to the LAD 07/2012.  . Prostate cancer 07/15/12  . Insulin dependent diabetes mellitus   . Stroke     a. Cryptogenic,  09/2013, event monitor with NSR. Small PFO noted on echo but neg LE duplex.  [name] PVC's (premature ventricular contractions)   . PFO (patent foramen ovale)     a. By echo 09/2013.  . Dilated aortic root     a. Mildly dilated by echo 09/2013, f/u MRA scheduled for 12/2014.  [name] Hyperlipidemia   . Multifocal atrial tachycardia     a. On tele 09/2013.  [name] Polycythemia     ROS:   All systems reviewed and negative except as noted in the HPI.   Past Surgical History  Procedure Laterality Date  . Kidney stones      2006  . Tonsillectomy      as adult  . Total knee arthroplasty  01/24/2011    Procedure: TOTAL KNEE ARTHROPLASTY;  Surgeon: Alta Corning;  Location: [place];  Service: Orthopedics;  Laterality: Left;  COMPUTER ASSISTED LEFT  TOTAL KNEE REPLACEMENT. Anesthesia a combination of regional and general.  . Coronary angioplasty with stent placement  08/05/2012    LAD, 1 stent  . Prostate biopsy  07/15/2012  . Tee without cardioversion N/A 09/27/2013    Procedure: TRANSESOPHAGEAL ECHOCARDIOGRAM (TEE);  Surgeon: Larey Dresser, MD;  Location: [place];  Service: Cardiovascular;  Laterality: N/A;  . Percutaneous coronary stent intervention (pci-s) N/A 08/05/2012    Procedure: PERCUTANEOUS CORONARY STENT INTERVENTION (PCI-S);  Surgeon: Sherren Mocha, MD;  Location: [facility] CATH LAB;  Service: Cardiovascular;  Laterality: N/A;     Family History  Problem Relation Age of Onset  . Leukemia Mother   . Heart Problems Father   . Heart Problems      family history  . Diabetes Sister   . Heart Problems Brother   . Heart attack Neg Hx   . Stroke    . Stroke Brother   . Hypertension Brother   . Cancer Mother      History   Social History  . Marital Status: Married    Spouse Name: N/A  . Number of Children: 3  . Years of Education: 12th   Occupational History  . retired    Social History Main Topics  . Smoking status: Never Smoker   . Smokeless tobacco: Never Used  . Alcohol Use: No    . Drug Use: No  . Sexual Activity: Not Currently   Other Topics Concern  . Not on file   Social History Narrative   [name]   Patient is right handed   Patient drinks coffee and sodas     BP 124/82 mmHg  Pulse 71  Ht 5\' 10"  (1.778 m)  Wt 261 lb 9.6 oz (118.661 kg)  BMI 37.54 kg/m2  Physical Exam:  Well appearing 69 yo man, NAD HEENT: Unremarkable Neck:  No JVD, no thyromegally Back:  No CVA tenderness Lungs:  Clear with no wheezes HEART:  Regular rate rhythm, no murmurs, no rubs, no clicks Abd:  soft, positive bowel sounds, no organomegally, no rebound, no guarding Ext:  2 plus pulses, no edema, no cyanosis, no clubbing Skin:  No rashes no nodules Neuro:  CN II through XII intact, motor grossly intact  EKG - nsr    Assess/Plan:

## 2014-08-23 NOTE — CV Procedure (Signed)
EP Procedure Note  Procedure: insertion of an ILR  Indication: cryptogenic stroke  Preprocedure diagnosis: cryptogenic stroke  Postprocedure diagnosis: same as preprocedure  Description of the procedure: After informed consent was obtained, the patient was then prepped and draped in the sterile manner. 20 cc of lidocaine was infiltrated over the left pectoral region. A 1 cm stab incision was carried out. The Medtronic implantable loop recorder, serial number [id number] was inserted and the R waves measured 0.24 mV. Benzoin and Steri-Strips her pain on the skin. A bandage was placed. She was returned to her room in satisfactory condition.  Complications: No immediate procedure competitions  Conclusion: Successful insertion of a Medtronic implantable loop recorder in a patient with cryptogenic stroke.  Cristopher Peru, M.D

## 2014-08-23 NOTE — Interval H&P Note (Signed)
History and Physical Interval Note:  08/23/2014 6:59 AM  James Mcclain  has presented today for surgery, with the diagnosis of stroke  The various methods of treatment have been discussed with the patient and family. After consideration of risks, benefits and other options for treatment, the patient has consented to  Procedure(s): Loop Recorder Insertion (N/A) as a surgical intervention .  The patient's history has been reviewed, patient examined, no change in status, stable for surgery.  I have reviewed the patient's chart and labs.  Questions were answered to the patient's satisfaction.     Cristopher Peru

## 2014-08-27 ENCOUNTER — Other Ambulatory Visit: Payer: Self-pay | Admitting: Cardiology

## 2014-08-30 ENCOUNTER — Ambulatory Visit: Payer: Medicare HMO | Admitting: *Deleted

## 2014-08-30 ENCOUNTER — Ambulatory Visit: Payer: Medicare HMO

## 2014-08-30 ENCOUNTER — Other Ambulatory Visit (HOSPITAL_BASED_OUTPATIENT_CLINIC_OR_DEPARTMENT_OTHER): Payer: Medicare HMO

## 2014-08-30 ENCOUNTER — Telehealth: Payer: Self-pay | Admitting: Oncology

## 2014-08-30 ENCOUNTER — Ambulatory Visit (HOSPITAL_BASED_OUTPATIENT_CLINIC_OR_DEPARTMENT_OTHER): Payer: Medicare HMO | Admitting: Oncology

## 2014-08-30 VITALS — BP 129/63 | HR 78 | Temp 98.1°F | Resp 18 | Ht 70.0 in | Wt 264.1 lb

## 2014-08-30 DIAGNOSIS — I639 Cerebral infarction, unspecified: Secondary | ICD-10-CM

## 2014-08-30 DIAGNOSIS — D751 Secondary polycythemia: Secondary | ICD-10-CM

## 2014-08-30 DIAGNOSIS — C61 Malignant neoplasm of prostate: Secondary | ICD-10-CM

## 2014-08-30 LAB — CUP PACEART INCLINIC DEVICE CHECK
Date Time Interrogation Session: 20160706122017
Zone Setting Detection Interval: 2000 ms
Zone Setting Detection Interval: 3000 ms
Zone Setting Detection Interval: 370 ms

## 2014-08-30 LAB — CBC WITH DIFFERENTIAL/PLATELET
BASO%: 1 % (ref 0.0–2.0)
Basophils Absolute: 0.1 10*3/uL (ref 0.0–0.1)
EOS%: 5.3 % (ref 0.0–7.0)
Eosinophils Absolute: 0.5 10*3/uL (ref 0.0–0.5)
HCT: 49.7 % (ref 38.4–49.9)
HGB: 15.8 g/dL (ref 13.0–17.1)
LYMPH%: 18.2 % (ref 14.0–49.0)
MCH: 28.2 pg (ref 27.2–33.4)
MCHC: 31.7 g/dL — ABNORMAL LOW (ref 32.0–36.0)
MCV: 89.1 fL (ref 79.3–98.0)
MONO#: 1 10*3/uL — ABNORMAL HIGH (ref 0.1–0.9)
MONO%: 10.5 % (ref 0.0–14.0)
NEUT#: 6.4 10*3/uL (ref 1.5–6.5)
NEUT%: 65 % (ref 39.0–75.0)
Platelets: 271 10*3/uL (ref 140–400)
RBC: 5.58 10*6/uL (ref 4.20–5.82)
RDW: 17.5 % — ABNORMAL HIGH (ref 11.0–14.6)
WBC: 9.9 10*3/uL (ref 4.0–10.3)
lymph#: 1.8 10*3/uL (ref 0.9–3.3)

## 2014-08-30 NOTE — Progress Notes (Signed)
Hematology and Oncology Follow Up Visit  James Mcclain [id number] 18-Jul-1945 69 y.o. 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   Prior Therapy: He received intermittent phlebotomies between March and July 2016 to keep his hematocrit less than 50.  Current therapy: He continues to be on antiplatelets agents in the form of aspirin at 81 mg and Plavix daily.  Interim History: [name] presents today for a follow-up visit. Since the last visit, he received a phlebotomies a few occasions which helped bring his hemoglobin down from 20 down to 15.8. His hematocrit is 49.7 today. He is completely asymptomatic either way. He did not report any complications from phlebotomy. He did not have any dizziness or lightheadedness. He has not reported any thrombotic events. He did not report any epistaxis or bleeding.  He does report occasional headaches and blurry vision. He did not have any syncope or seizures. He does report fatigue and tiredness and occasional dyspnea on exertion. But is able to perform all activities of daily living. He does not report any chest pain, palpitation, orthopnea. He does not report any cough, hemoptysis, wheezing or shortness of breath. He does not report any nausea, vomiting, abdominal pain, malaise satiety. He does not report any constipation, diarrhea, hematochezia or melena. He does not report any frequency urgency or hesitancy. He does not report any skeletal complaints. Rest of his review of systems unremarkable.   Medications: I have reviewed the patient's current medications.  Current Outpatient Prescriptions  Medication Sig Dispense Refill  . allopurinol  (ZYLOPRIM) 100 MG tablet     . amLODipine (NORVASC) 10 MG tablet     . ascorbic acid (VITAMIN C) 1000 MG tablet Take 1,000 mg by mouth daily.    [name] aspirin 81 MG tablet Take 81 mg by mouth daily.    [name] atorvastatin (LIPITOR) 40 MG tablet     . carvedilol (COREG) 12.5 MG tablet     . Cholecalciferol 1000 UNITS TBDP Take by mouth.    . clopidogrel (PLAVIX) 75 MG tablet     . losartan-hydrochlorothiazide (HYZAAR) 100-25 MG per tablet     . magnesium oxide (MAG-OX) 400 MG tablet Take 400 mg by mouth daily.    . metFORMIN (GLUCOPHAGE) 500 MG tablet Take 500 mg by mouth 2 (two) times daily with a meal.     No current facility-administered medications for this visit.     Allergies:  Allergies  Allergen Reactions  . Fish Allergy Swelling    Past Medical History, Surgical history, Social history, and Family History were reviewed and updated.   Physical Exam: Blood pressure 129/63, pulse 78, temperature 98.1 F (36.7 C), temperature source Oral, resp. rate 18, height 5\' 10"  (1.778 m), weight 264 lb 1.6 oz (119.795 kg), SpO2 99 %. ECOG: 0 General appearance: alert and cooperative Head: Normocephalic, without obvious abnormality Neck: no adenopathy Lymph nodes: Cervical, supraclavicular, and axillary nodes normal. Heart:regular rate and rhythm, S1, S2 normal, no murmur, click, rub or gallop Lung:chest clear, no wheezing, rales, normal symmetric air entry Abdomin: soft, non-tender, without masses or organomegaly EXT:no erythema, induration, or nodules Neurological examination: No deficits.  Lab Results: Lab Results  Component Value Date   WBC 9.9  08/30/2014   HGB 15.8 08/30/2014   HCT 49.7 08/30/2014   MCV 89.1 08/30/2014   PLT 271 08/30/2014     Chemistry      Component Value Date/Time   NA 141 07/04/2014 1643   K 4.3 07/04/2014 1643   CL 103 07/04/2014 1643   CO2 29 07/04/2014 1643   BUN 18 07/04/2014 1643   CREATININE 1.18 07/04/2014 1643   CREATININE 1.10 07/03/2014 1341       Component Value Date/Time   CALCIUM 9.3 07/04/2014 1643   ALKPHOS 68 07/04/2014 1643   AST 16 07/04/2014 1643   ALT 19 07/04/2014 1643   BILITOT 0.8 07/04/2014 1643         Impression and Plan:  69 year old gentleman with the following issues:  1. Polycythemia: Differential diagnosis includes primary causes such as polycythemia vera especially in the setting of  a positive JAK2 mutation with persistently elevated hemoglobin. He did have an acute thrombotic event although it is unclear to me whether this event was precipitated by his hypertension and hyperlipidemia among other risk factors. This could also be reactive in nature for secondary causes. He is status post therapeutic phlebotomies that brought his hematocrit less than 50. The plan is to continue observation for the time being and repeat his hematocrit in 4 months and reinstitute phlebotomies as needed.  2. Thrombosis prophylaxis: He is already on aspirin and Plavix and should be well protected.  3. Follow-up: Will be in 4 months.           [facility], MD 7/6/20169:34 AM

## 2014-08-30 NOTE — Telephone Encounter (Signed)
Gave adn printed appt sched and avs for pt for July thru [facility]

## 2014-08-30 NOTE — Progress Notes (Signed)
Wound check for LINQ in clinic. Wound well healed with no redness, edema or drainage. Pt instructed about wound care. Pt with no episodes. Battery: good. R waves 0.46mV. Monthly carelink summary reports, ROV with GT 11/21/14.

## 2014-08-31 ENCOUNTER — Ambulatory Visit: Payer: Medicare HMO

## 2014-09-05 ENCOUNTER — Other Ambulatory Visit: Payer: Medicare HMO

## 2014-09-05 ENCOUNTER — Ambulatory Visit: Payer: Medicare HMO | Admitting: Oncology

## 2014-09-11 ENCOUNTER — Other Ambulatory Visit: Payer: Self-pay | Admitting: Family Medicine

## 2014-09-11 DIAGNOSIS — D751 Secondary polycythemia: Secondary | ICD-10-CM

## 2014-09-11 DIAGNOSIS — C61 Malignant neoplasm of prostate: Secondary | ICD-10-CM

## 2014-09-11 MED ORDER — PEN NEEDLES 31G X 6 MM MISC: 1.0000 | Freq: Every day | Status: DC

## 2014-09-11 MED ORDER — INSULIN GLARGINE 100 UNITS/ML SOLOSTAR PEN: 45.0000 [IU] | PEN_INJECTOR | Freq: Every day | SUBCUTANEOUS | Status: DC

## 2014-09-11 MED ORDER — METFORMIN HCL 500 MG PO TABS: 1000.0000 mg | ORAL_TABLET | Freq: Two times a day (BID) | ORAL | Status: DC

## 2014-09-11 NOTE — Telephone Encounter (Signed)
Medication refilled per protocol. 

## 2014-09-12 ENCOUNTER — Other Ambulatory Visit: Payer: Self-pay | Admitting: Family Medicine

## 2014-09-12 ENCOUNTER — Telehealth: Payer: Self-pay | Admitting: Family Medicine

## 2014-09-12 MED ORDER — INSULIN GLARGINE 100 UNITS/ML SOLOSTAR PEN: 45.0000 [IU] | PEN_INJECTOR | Freq: Every day | SUBCUTANEOUS | Status: DC

## 2014-09-12 NOTE — Telephone Encounter (Signed)
Spoke to pt and his insulin requires a PA - Maudie Mercury is working on that and in the mean time he can come and get samples - we have levemir and he will dose it the same as his lantus. Pt is aware.

## 2014-09-12 NOTE — Telephone Encounter (Signed)
PA submitted for Lantus Solostar pens through "CoverMyMeds"  Case [id number]

## 2014-09-12 NOTE — Telephone Encounter (Signed)
Patient calling to say that his insurance is not paying for his insulin  Would like a call back regarding this  [phone]

## 2014-09-13 ENCOUNTER — Telehealth: Payer: Self-pay | Admitting: Family Medicine

## 2014-09-13 MED ORDER — INSULIN DETEMIR 100 UNIT/ML FLEXPEN: 45.0000 [IU] | PEN_INJECTOR | Freq: Every day | SUBCUTANEOUS | Status: DC

## 2014-09-13 NOTE — Telephone Encounter (Signed)
Lantus was approved by insurance but pt co-pay is $256/month.  Levemir is on formulary at less expense.  Order for Levemir 45 units at bedtime to pharmacy.  Samples given to patient.

## 2014-09-13 NOTE — Telephone Encounter (Signed)
PA rec'd Approved.  02/23/15 - 02/24/15.  Ref# [id number]   Approval faxed to pharmacy

## 2014-09-14 NOTE — Telephone Encounter (Signed)
Agree with change

## 2014-09-22 ENCOUNTER — Ambulatory Visit (INDEPENDENT_AMBULATORY_CARE_PROVIDER_SITE_OTHER): Payer: Medicare HMO | Admitting: *Deleted

## 2014-09-22 DIAGNOSIS — I639 Cerebral infarction, unspecified: Secondary | ICD-10-CM | POA: Diagnosis not present

## 2014-09-25 ENCOUNTER — Ambulatory Visit (HOSPITAL_COMMUNITY)
Admission: RE | Admit: 2014-09-25 | Discharge: 2014-09-25 | Disposition: A | Discharge status: Home / Self Care | Payer: Medicare HMO | Source: Ambulatory Visit | Attending: Cardiology | Admitting: Cardiology

## 2014-09-25 DIAGNOSIS — I712 Thoracic aortic aneurysm, without rupture: Secondary | ICD-10-CM | POA: Insufficient documentation

## 2014-09-25 DIAGNOSIS — I77810 Thoracic aortic ectasia: Secondary | ICD-10-CM | POA: Diagnosis present

## 2014-09-25 LAB — CREATININE, SERUM
Creatinine, Ser: 1.27 mg/dL — ABNORMAL HIGH (ref 0.61–1.24)
GFR calc Af Amer: >60 mL/min (ref >60)
GFR calc non Af Amer: 56 mL/min — ABNORMAL LOW (ref >60)

## 2014-09-25 MED ORDER — GADOBENATE DIMEGLUMINE 529 MG/ML IV SOLN
20.0000 mL | Freq: Once | INTRAVENOUS | Status: AC | PRN
  Administered 2014-09-25: 20 mL via INTRAVENOUS

## 2014-09-26 ENCOUNTER — Other Ambulatory Visit: Payer: Self-pay | Admitting: Cardiology

## 2014-09-27 ENCOUNTER — Other Ambulatory Visit: Payer: Self-pay | Admitting: *Deleted

## 2014-09-27 DIAGNOSIS — I77810 Thoracic aortic ectasia: Secondary | ICD-10-CM

## 2014-10-02 ENCOUNTER — Encounter: Payer: Self-pay | Admitting: Internal Medicine

## 2014-10-02 NOTE — Progress Notes (Signed)
Loop recorder 

## 2014-10-06 LAB — CUP PACEART REMOTE DEVICE CHECK: Date Time Interrogation Session: 20160812161849

## 2014-10-22 ENCOUNTER — Other Ambulatory Visit: Payer: Self-pay | Admitting: Cardiology

## 2014-10-23 ENCOUNTER — Ambulatory Visit (INDEPENDENT_AMBULATORY_CARE_PROVIDER_SITE_OTHER): Payer: Medicare HMO | Admitting: *Deleted

## 2014-10-23 DIAGNOSIS — I639 Cerebral infarction, unspecified: Secondary | ICD-10-CM

## 2014-10-25 ENCOUNTER — Telehealth: Payer: Self-pay | Admitting: Family Medicine

## 2014-10-25 ENCOUNTER — Encounter: Payer: Self-pay | Admitting: Internal Medicine

## 2014-10-25 NOTE — Telephone Encounter (Signed)
Tried to leave a message but it kept saying nothing was recorded after the 5th time I hung up - no message left.

## 2014-10-25 NOTE — Progress Notes (Signed)
Loop recorder 

## 2014-10-25 NOTE — Telephone Encounter (Signed)
lmtrc

## 2014-10-25 NOTE — Telephone Encounter (Signed)
Patient is calling with some questions regarding diabetes  [phone]

## 2014-11-03 LAB — CUP PACEART REMOTE DEVICE CHECK: Date Time Interrogation Session: 20160909121928

## 2014-11-03 NOTE — Progress Notes (Signed)
Carelink summary report received. Battery status OK. Normal device function. No new symptom episodes, tachy episodes, brady, or pause episodes. No new AF episodes. Monthly summary reports and ROV with GT on 11/21/2014 at 10:15am.

## 2014-11-21 ENCOUNTER — Ambulatory Visit (INDEPENDENT_AMBULATORY_CARE_PROVIDER_SITE_OTHER): Payer: Medicare HMO | Admitting: *Deleted

## 2014-11-21 ENCOUNTER — Ambulatory Visit (INDEPENDENT_AMBULATORY_CARE_PROVIDER_SITE_OTHER): Payer: Medicare HMO | Admitting: Internal Medicine

## 2014-11-21 ENCOUNTER — Encounter: Payer: Self-pay | Admitting: Internal Medicine

## 2014-11-21 VITALS — BP 124/80 | HR 82 | Ht 70.0 in | Wt 260.2 lb

## 2014-11-21 DIAGNOSIS — I639 Cerebral infarction, unspecified: Secondary | ICD-10-CM

## 2014-11-21 DIAGNOSIS — I10 Essential (primary) hypertension: Secondary | ICD-10-CM

## 2014-11-21 DIAGNOSIS — E785 Hyperlipidemia, unspecified: Secondary | ICD-10-CM

## 2014-11-21 LAB — CUP PACEART INCLINIC DEVICE CHECK
Date Time Interrogation Session: 20160927103212
Zone Setting Detection Interval: 2000 ms
Zone Setting Detection Interval: 3000 ms
Zone Setting Detection Interval: 370 ms

## 2014-11-21 NOTE — Assessment & Plan Note (Signed)
Minimal deficit. He will continue his current meds. He has not had any atrial fib on ILR interogation.

## 2014-11-21 NOTE — Assessment & Plan Note (Signed)
His blood pressure is well controlled. We will see him back as needed.

## 2014-11-21 NOTE — Patient Instructions (Addendum)
Medication Instructions:  Your physician recommends that you continue on your current medications as directed. Please refer to the Current Medication list given to you today.   Labwork: None ordered  Testing/Procedures: None ordered  Follow-Up: Your physician recommends that you schedule a follow-up appointment as needed with Dr Lovena Le    Any Other Special Instructions Will Be Listed Below (If Applicable).

## 2014-11-21 NOTE — Progress Notes (Signed)
HPI [name] is a 69 yo man with a h/o CAD, aortic root problems, unexplained stroke and HTN is referred today by Dr. Aundra Dubin for consideration of insertion of an ILR . The patient does not have palpitations. No significant deficits from his stroke except for some difficulty with speech. He has not had a diagnosis of either atrial fib or flutter. He is doing well. He is back golfing without difficulty. Allergies  Allergen Reactions  . Fish Allergy Swelling     Current Outpatient Prescriptions  Medication Sig Dispense Refill  . allopurinol (ZYLOPRIM) 100 MG tablet Take 100 mg by mouth daily.     [name] amLODipine (NORVASC) 10 MG tablet Take 10 mg by mouth daily.     [name] ascorbic acid (VITAMIN C) 1000 MG tablet Take 1,000 mg by mouth daily.    [name] aspirin 81 MG tablet Take 81 mg by mouth daily.    [name] atorvastatin (LIPITOR) 40 MG tablet Take 40 mg by mouth daily.     . carvedilol (COREG) 12.5 MG tablet TAKE ONE TABLET BY MOUTH TWICE DAILY 60 tablet 2  . Cholecalciferol 1000 UNITS TBDP Take 1 tablet by mouth daily.     . clopidogrel (PLAVIX) 75 MG tablet TAKE ONE TABLET BY MOUTH ONCE DAILY WITH BREAKFAST. 30 tablet 2  . Insulin Detemir (LEVEMIR FLEXPEN) 100 UNIT/ML Pen Inject 45 Units into the skin daily at 10 pm. 15 mL 11  . Insulin Pen Needle (PEN NEEDLES) 31G X 6 MM MISC 1 each by Does not apply route daily. 100 each 3  . losartan-hydrochlorothiazide (HYZAAR) 100-25 MG per tablet Take 1 tablet by mouth daily.     . magnesium oxide (MAG-OX) 400 MG tablet Take 400 mg by mouth daily.    . metFORMIN (GLUCOPHAGE) 500 MG tablet Take 2 tablets (1,000 mg total) by mouth 2 (two) times daily with a meal. 120 tablet 5   No current facility-administered medications for this visit.     Past Medical History  Diagnosis Date  . Arthritis   . Essential hypertension   . Elevated PSA   . Coronary artery disease     a. s/p Xience DES to the LAD 07/2012.  . Prostate cancer 07/15/12  . Insulin dependent  diabetes mellitus   . Stroke     a. Cryptogenic, 09/2013, event monitor with NSR. Small PFO noted on echo but neg LE duplex.  [name] PVC's (premature ventricular contractions)   . PFO (patent foramen ovale)     a. By echo 09/2013.  . Dilated aortic root     a. Mildly dilated by echo 09/2013, f/u MRA scheduled for 12/2014.  [name] Hyperlipidemia   . Multifocal atrial tachycardia     a. On tele 09/2013.  [name] Polycythemia     ROS:   All systems reviewed and negative except as noted in the HPI.   Past Surgical History  Procedure Laterality Date  . Kidney stones      2006  . Tonsillectomy      as adult  . Total knee arthroplasty  01/24/2011    Procedure: TOTAL KNEE ARTHROPLASTY;  Surgeon: Alta Corning;  Location: [place];  Service: Orthopedics;  Laterality: Left;  COMPUTER ASSISTED LEFT  TOTAL KNEE REPLACEMENT. Anesthesia a combination of regional and general.  . Coronary angioplasty with stent placement  08/05/2012    LAD, 1 stent  . Prostate biopsy  07/15/2012  . Tee without cardioversion N/A 09/27/2013  Procedure: TRANSESOPHAGEAL ECHOCARDIOGRAM (TEE);  Surgeon: Larey Dresser, MD;  Location: [facility] ENDOSCOPY;  Service: Cardiovascular;  Laterality: N/A;  . Percutaneous coronary stent intervention (pci-s) N/A 08/05/2012    Procedure: PERCUTANEOUS CORONARY STENT INTERVENTION (PCI-S);  Surgeon: Sherren Mocha, MD;  Location: [facility] CATH LAB;  Service: Cardiovascular;  Laterality: N/A;  . Ep implantable device N/A 08/23/2014    Procedure: Loop Recorder Insertion;  Surgeon: Evans Lance, MD;  Location: [place] CV LAB;  Service: Cardiovascular;  Laterality: N/A;     Family History  Problem Relation Age of Onset  . Leukemia Mother   . Heart Problems Father   . Heart Problems      family history  . Diabetes Sister   . Heart Problems Brother   . Heart attack Neg Hx   . Stroke    . Stroke Brother   . Hypertension Brother   . Cancer Mother      Social History   Social History  . Marital  Status: Married    Spouse Name: N/A  . Number of Children: 3  . Years of Education: 12th   Occupational History  . retired    Social History Main Topics  . Smoking status: Never Smoker   . Smokeless tobacco: Never Used  . Alcohol Use: No  . Drug Use: No  . Sexual Activity: Not Currently   Other Topics Concern  . Not on file   Social History Narrative   [name]   Patient is right handed   Patient drinks coffee and sodas     BP 124/80 mmHg  Pulse 82  Ht 5\' 10"  (1.778 m)  Wt 260 lb 3.2 oz (118.026 kg)  BMI 37.33 kg/m2  Physical Exam:  Well appearing 68 yo man, NAD HEENT: Unremarkable Neck:  6 cm JVD, no thyromegally Back:  No CVA tenderness Lungs:  Clear with no wheezes HEART:  Regular rate rhythm, no murmurs, no rubs, no clicks Abd:  soft, positive bowel sounds, no organomegally, no rebound, no guarding Ext:  2 plus pulses, no edema, no cyanosis, no clubbing Skin:  No rashes no nodules Neuro:  CN II through XII intact, motor grossly intact  EKG - nsr    Assess/Plan:

## 2014-11-21 NOTE — Assessment & Plan Note (Signed)
He is encouraged to maintain a low fat diet and continue his statin.

## 2014-11-23 NOTE — Progress Notes (Signed)
Loop recorder 

## 2014-11-29 ENCOUNTER — Encounter: Payer: Self-pay | Admitting: Internal Medicine

## 2014-11-30 LAB — CUP PACEART REMOTE DEVICE CHECK: Date Time Interrogation Session: 20160927120544

## 2014-11-30 NOTE — Progress Notes (Signed)
Carelink summary report received. Battery status OK. Normal device function. No new symptom episodes, tachy episodes, brady, or pause episodes. No new AF episodes. Monthly summary reports and ROV GT 11/21/14.

## 2014-12-04 ENCOUNTER — Encounter: Payer: Self-pay | Admitting: Radiation Oncology

## 2014-12-04 ENCOUNTER — Encounter: Payer: Self-pay | Admitting: Internal Medicine

## 2014-12-04 NOTE — Progress Notes (Signed)
GU Location of Tumor / Histology: Adenocarcinoma of the Prostate  If Prostate Cancer, Gleason Score is (3 + 4) and PSA is (7.06)  James Mcclain presented in May 2014 with an elevated PSA of 8.54 with placement on Active Surveillance and had a repeat biopsy on 09/24/14 with a PSA of 7.06 with signs/symptoms of  Past/Anticipated interventions by urology, if any: Biopsy of Prostate  Past/Anticipated interventions by medical oncology, if any: Unknown  Weight changes, if any: lost  8 lbs - "stopped eating so much  Bowel/Bladder complaints, if any: "Takes a while for urinary stream and is weak sometimes first time in the morning" with improvement during the day.  Nocturia 2-3 times.  Hx of Kidney stones  Nausea/Vomiting, if any: None  Pain issues, if any:  None  SAFETY ISSUES:  Prior radiation? No  Pacemaker/ICD? Yes  Possible current pregnancy? N/A  Is the patient on methotrexate? No  Current Complaints / other details:     Hx of stroke in 2014 - slurred speech and without any limitation of motion of extremities.

## 2014-12-05 ENCOUNTER — Encounter: Payer: Self-pay | Admitting: Radiation Oncology

## 2014-12-05 ENCOUNTER — Ambulatory Visit
Admission: RE | Admit: 2014-12-05 | Discharge: 2014-12-05 | Disposition: A | Discharge status: Home / Self Care | Payer: Medicare HMO | Source: Ambulatory Visit | Attending: Radiation Oncology | Admitting: Radiation Oncology

## 2014-12-05 VITALS — BP 110/83 | HR 84 | Temp 97.9°F | Ht 70.0 in | Wt 258.8 lb

## 2014-12-05 DIAGNOSIS — Z8673 Personal history of transient ischemic attack (TIA), and cerebral infarction without residual deficits: Secondary | ICD-10-CM | POA: Insufficient documentation

## 2014-12-05 DIAGNOSIS — Z510 Encounter for antineoplastic radiation therapy: Secondary | ICD-10-CM | POA: Diagnosis present

## 2014-12-05 DIAGNOSIS — R972 Elevated prostate specific antigen [PSA]: Secondary | ICD-10-CM | POA: Insufficient documentation

## 2014-12-05 DIAGNOSIS — C61 Malignant neoplasm of prostate: Secondary | ICD-10-CM | POA: Diagnosis present

## 2014-12-05 HISTORY — DX: Personal history of urinary calculi: Z87.442

## 2014-12-05 NOTE — Progress Notes (Addendum)
CC: Dr. Baruch Gouty ([facility]), Dr. 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  James Mcclain is a pleasant 69 year old male who is seen today through the courtesy of Dr. 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  I first saw the patient in consultation on 09/09/2012 when he presented with stage T1c favorable risk adenocarcinoma prostate.  His PSA rose to 8.4 by April 2014.  One of 12 biopsies on 07/15/2012 contained Gleason 6 (3+3) adenocarcinoma involving just 20% of one core.  Of note is that his prostate volume was 145.4 mL.  He elected active surveillance and a repeat prostate biopsy on 09/23/2013 was diagnostic for Gleason 7 (3+4) adenocarcinoma from the left apex involving 30% of one core.  His prostate volume was found to be 170 mL.  That same day he apparently had an embolic stroke to his left hemisphere (left middle cerebral artery).  He has had almost full recovery except some mild slurring of his speech.  He saw Dr. Janice Norrie for a follow-up visit on 11/21/2014.  His PSA was elevated to 9.52 from 11/03/2014.   He is now referred here for discussion of possible radiation therapy with curative intent.  He is doing reasonably well from a GU and GI standpoint.  His I PSS score is 10.  He does have erectile dysfunction.  Physical examination: Alert and oriented. Filed Vitals:   12/05/14 1423  BP: 110/83  Pulse: 84  Temp: 97.9 F (36.6 C)   Rectal examination: His prostate gland is markedly enlarged.  I can only feel the lower mid to apical region.  There is no focal induration or nodularity.  Laboratory data: PSA 9.52 from 11/03/2014  Impression: Clinical stage T1c intermediate risk adenocarcinoma prostate.  I explained to the patient and his sister that his prognosis is related to his stage, Gleason score, and PSA level.  His stage and PSA level are favorable while his Gleason score of 7 is of  intermediate favorability.  Other prognostic factors include PSA doubling time and disease volume.  These 2 are favorable.  I suspect that a good portion of his PSA is from his enlarged prostate rather than cancer.  We discussed active surveillance versus radiation therapy.  By default, his only radiation therapy option is external beam/IMRT.  He would ever be a candidate for seed implantation because of his prostate volume even with downsizing.  We discussed the potential acute and late toxicities of radiation therapy.  To minimize rectal toxicity, do recommend that we shrink his gland by at least one third with at least 3 months of androgen deprivation therapy.  From a prostate seed implant experience we can expect, on the average, an additional 10% reduction with a full 6 months of androgen deprivation therapy.  I think 3 months would be satisfactory.  This will reduce the amount of rectum receiving a therapeutic dose of radiation therapy with IMRT.  We discussed the side effects of short-term androgen deprivation therapy which includes hot flashes, possible fatigue, and loss of sex drive.  We also talked about placement of 3 gold seed markers for image guidance during his IMRT.  We talked about bladder filling to minimize possible acute and late urinary toxicity.  We will get him scheduled for 3 months of androgen deprivation therapy and placement of 3 gold seeds at [facility].  I will then see him back for a follow-up visit in 2 months.  I  informed him that I will be retiring at the end of the year, and Dr. Tammi Klippel can proceed with CT simulation and treatment in January 2017.  Plan: As above.  I will contact James Mcclain, Dr. Sammie Bench nurse, to get him started on androgen deprivation therapy if this is acceptable to his new primary urologist.  Follow-up visit here to see me in 2 months.  45 minutes was spent face-to-face with the patient, primarily counseling patient and coordinating his care.  Addendum: Dr.  Baruch Gouty has assumed the urologic care of James Mcclain.

## 2014-12-05 NOTE — Addendum Note (Signed)
Encounter addended by: Arloa Koh, MD on: 12/05/2014  4:30 PM<BR>     Documentation filed: Arn Medal VN

## 2014-12-06 ENCOUNTER — Telehealth: Payer: Self-pay | Admitting: *Deleted

## 2014-12-06 NOTE — Telephone Encounter (Signed)
CALLED PATIENT TO INFORM OF GOLD SEED PLACEMENT ON 01-09-15- ARRIVAL TIME - 2:30 PM @ DR. BUDZYN'S OFFICE AND HIS FNC APPT. WITH DR. Valere Dross ON 02-15-15 - ARRIVAL TIME - 10:20 AM, LVM FOR A RETURN CALL

## 2014-12-13 ENCOUNTER — Encounter: Payer: Self-pay | Admitting: Internal Medicine

## 2014-12-14 NOTE — Addendum Note (Signed)
Encounter addended by: Benn Moulder, RN on: 12/14/2014 11:32 AM<BR>     Documentation filed: Charges VN

## 2014-12-21 ENCOUNTER — Ambulatory Visit (INDEPENDENT_AMBULATORY_CARE_PROVIDER_SITE_OTHER): Payer: Medicare HMO | Admitting: *Deleted

## 2014-12-21 DIAGNOSIS — I638 Other cerebral infarction: Secondary | ICD-10-CM

## 2014-12-21 DIAGNOSIS — I6389 Other cerebral infarction: Secondary | ICD-10-CM

## 2014-12-21 DIAGNOSIS — I10 Essential (primary) hypertension: Secondary | ICD-10-CM | POA: Diagnosis not present

## 2014-12-21 NOTE — Progress Notes (Signed)
Loop recorder 

## 2014-12-22 ENCOUNTER — Other Ambulatory Visit: Payer: Medicare HMO

## 2014-12-22 ENCOUNTER — Other Ambulatory Visit: Payer: Self-pay | Admitting: Family Medicine

## 2014-12-22 DIAGNOSIS — K529 Noninfective gastroenteritis and colitis, unspecified: Secondary | ICD-10-CM

## 2014-12-22 DIAGNOSIS — I10 Essential (primary) hypertension: Secondary | ICD-10-CM

## 2014-12-22 DIAGNOSIS — Z79899 Other long term (current) drug therapy: Secondary | ICD-10-CM

## 2014-12-22 DIAGNOSIS — Z0000 Encounter for general adult medical examination without abnormal findings: Secondary | ICD-10-CM

## 2014-12-22 DIAGNOSIS — E785 Hyperlipidemia, unspecified: Secondary | ICD-10-CM

## 2014-12-22 DIAGNOSIS — E119 Type 2 diabetes mellitus without complications: Secondary | ICD-10-CM

## 2014-12-22 LAB — COMPLETE METABOLIC PANEL WITH GFR
ALT: 16 U/L (ref 9–46)
AST: 13 U/L (ref 10–35)
Albumin: 3.9 g/dL (ref 3.6–5.1)
Alkaline Phosphatase: 70 U/L (ref 40–115)
BUN: 23 mg/dL (ref 7–25)
CO2: 29 mmol/L (ref 20–31)
Calcium: 8.8 mg/dL (ref 8.6–10.3)
Chloride: 101 mmol/L (ref 98–110)
Creat: 1.37 mg/dL — ABNORMAL HIGH (ref 0.70–1.25)
GFR, Est African American: 61 mL/min (ref >=60)
GFR, Est Non African American: 53 mL/min — ABNORMAL LOW (ref >=60)
Glucose, Bld: 142 mg/dL — ABNORMAL HIGH (ref 70–99)
Potassium: 4.6 mmol/L (ref 3.5–5.3)
Sodium: 140 mmol/L (ref 135–146)
Total Bilirubin: 0.9 mg/dL (ref 0.2–1.2)
Total Protein: 6.8 g/dL (ref 6.1–8.1)

## 2014-12-22 LAB — CBC WITH DIFFERENTIAL/PLATELET
Basophils Absolute: 0.1 10*3/uL (ref 0.0–0.1)
Basophils Relative: 1 % (ref 0–1)
Eosinophils Absolute: 0.5 10*3/uL (ref 0.0–0.7)
Eosinophils Relative: 6 % — ABNORMAL HIGH (ref 0–5)
HCT: 50.3 % (ref 39.0–52.0)
Hemoglobin: 16 g/dL (ref 13.0–17.0)
Lymphocytes Relative: 27 % (ref 12–46)
Lymphs Abs: 2.2 10*3/uL (ref 0.7–4.0)
MCH: 25.2 pg — ABNORMAL LOW (ref 26.0–34.0)
MCHC: 31.8 g/dL (ref 30.0–36.0)
MCV: 79.3 fL (ref 78.0–100.0)
MPV: 9.7 fL (ref 8.6–12.4)
Monocytes Absolute: 0.8 10*3/uL (ref 0.1–1.0)
Monocytes Relative: 10 % (ref 3–12)
Neutro Abs: 4.6 10*3/uL (ref 1.7–7.7)
Neutrophils Relative %: 56 % (ref 43–77)
Platelets: 259 10*3/uL (ref 150–400)
RBC: 6.34 MIL/uL — ABNORMAL HIGH (ref 4.22–5.81)
RDW: 19 % — ABNORMAL HIGH (ref 11.5–15.5)
WBC: 8.3 10*3/uL (ref 4.0–10.5)

## 2014-12-22 LAB — LIPID PANEL
Cholesterol: 102 mg/dL — ABNORMAL LOW (ref 125–200)
HDL: 27 mg/dL — ABNORMAL LOW (ref >=40)
LDL Cholesterol: 57 mg/dL (ref <130)
Total CHOL/HDL Ratio: 3.8 Ratio (ref <=5.0)
Triglycerides: 88 mg/dL (ref <150)
VLDL: 18 mg/dL (ref <30)

## 2014-12-22 LAB — TSH: TSH: 0.593 u[IU]/mL (ref 0.350–4.500)

## 2014-12-22 LAB — HEMOGLOBIN A1C
Hgb A1c MFr Bld: 8.8 % — ABNORMAL HIGH (ref <5.7)
Mean Plasma Glucose: 206 mg/dL — ABNORMAL HIGH (ref <117)

## 2014-12-25 ENCOUNTER — Other Ambulatory Visit: Payer: Self-pay | Admitting: Cardiology

## 2014-12-25 ENCOUNTER — Other Ambulatory Visit: Payer: Self-pay | Admitting: Family Medicine

## 2014-12-26 ENCOUNTER — Encounter: Payer: Self-pay | Admitting: Family Medicine

## 2014-12-26 ENCOUNTER — Ambulatory Visit (INDEPENDENT_AMBULATORY_CARE_PROVIDER_SITE_OTHER): Payer: Medicare HMO | Admitting: Family Medicine

## 2014-12-26 VITALS — BP 140/76 | HR 80 | Temp 98.4°F | Resp 18 | Ht 70.5 in | Wt 258.0 lb

## 2014-12-26 DIAGNOSIS — I10 Essential (primary) hypertension: Secondary | ICD-10-CM | POA: Diagnosis not present

## 2014-12-26 DIAGNOSIS — E119 Type 2 diabetes mellitus without complications: Secondary | ICD-10-CM

## 2014-12-26 DIAGNOSIS — Z1211 Encounter for screening for malignant neoplasm of colon: Secondary | ICD-10-CM | POA: Diagnosis not present

## 2014-12-26 DIAGNOSIS — Z0000 Encounter for general adult medical examination without abnormal findings: Secondary | ICD-10-CM | POA: Diagnosis not present

## 2014-12-26 DIAGNOSIS — E785 Hyperlipidemia, unspecified: Secondary | ICD-10-CM

## 2014-12-26 DIAGNOSIS — Z23 Encounter for immunization: Secondary | ICD-10-CM | POA: Diagnosis not present

## 2014-12-26 DIAGNOSIS — Z794 Long term (current) use of insulin: Secondary | ICD-10-CM

## 2014-12-26 NOTE — Progress Notes (Signed)
Subjective:    Patient ID: James Mcclain, male    DOB: 12-01-1945, 69 y.o.   MRN: [id number]  HPI Patient is a 69 year old African-American male here today for complete physical exam.  He is scheduled to follow-up with his urologist tomorrow to follow-up his prostate cancer. Otherwise he has been doing well. He does complain of generalized fatigue. He is not exercising. He is not checking his blood sugars at all. He denies any symptoms of hypoglycemia. Unfortunately his hemoglobin A1c is elevated at 8.8. His last colonoscopy was in 2008 is not due again until 2018 however he is due for fecal occult blood cards 3. He has had Pneumovax 23. He has had Prevnar 13. He has had a tetanus shot in the last 10 years. He is due for annual flu shot today. Past Medical History  Diagnosis Date  . Arthritis   . Essential hypertension   . Elevated PSA   . Coronary artery disease     a. s/p Xience DES to the LAD 07/2012.  . Prostate cancer ([place]) 07/15/12  . Insulin dependent diabetes mellitus ([place])   . Stroke [facility])     a. Cryptogenic, 09/2013, event monitor with NSR. Small PFO noted on echo but neg LE duplex.  [name] PVC's (premature ventricular contractions)   . PFO (patent foramen ovale)     a. By echo 09/2013.  . Dilated aortic root ([place])     a. Mildly dilated by echo 09/2013, f/u MRA scheduled for 12/2014.  [name] Hyperlipidemia   . 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 On tele 09/2013.  [name] Polycythemia   . Hx of gout   . History of kidney stones    Past Surgical History  Procedure Laterality Date  . Kidney stones      2006 - LITHOTRIPSY  . Tonsillectomy      as adult  . Total knee arthroplasty  01/24/2011    Procedure: TOTAL KNEE ARTHROPLASTY;  Surgeon: Alta Corning;  Location: [place];  Service: Orthopedics;  Laterality: Left;  COMPUTER ASSISTED LEFT  TOTAL KNEE REPLACEMENT. Anesthesia a combination of regional and general.  . Coronary angioplasty with stent placement  08/05/2012    LAD, 1 stent  .  Prostate biopsy  07/15/2012  . Tee without cardioversion N/A 09/27/2013    Procedure: TRANSESOPHAGEAL ECHOCARDIOGRAM (TEE);  Surgeon: Larey Dresser, MD;  Location: [facility] ENDOSCOPY;  Service: Cardiovascular;  Laterality: N/A;  . Percutaneous coronary stent intervention (pci-s) N/A 08/05/2012    Procedure: PERCUTANEOUS CORONARY STENT INTERVENTION (PCI-S);  Surgeon: Sherren Mocha, MD;  Location: [facility] CATH LAB;  Service: Cardiovascular;  Laterality: N/A;  . Ep implantable device N/A 08/23/2014    Procedure: Loop Recorder Insertion;  Surgeon: Evans Lance, MD;  Location: [place] CV LAB;  Service: Cardiovascular;  Laterality: N/A;  . Prostate biopsy  09/23/2013   Current Outpatient Prescriptions on File Prior to Visit  Medication Sig Dispense Refill  . allopurinol (ZYLOPRIM) 100 MG tablet Take 100 mg by mouth daily.     [name] amLODipine (NORVASC) 10 MG tablet Take 10 mg by mouth daily.     [name] ascorbic acid (VITAMIN C) 1000 MG tablet Take 1,000 mg by mouth daily.    [name] aspirin 81 MG tablet Take 81 mg by mouth daily.    [name] atorvastatin (LIPITOR) 40 MG tablet Take 40 mg by mouth daily.     . carvedilol (COREG) 12.5 MG tablet TAKE ONE TABLET BY MOUTH TWICE DAILY  60 tablet 2  . Cholecalciferol 1000 UNITS TBDP Take 1 tablet by mouth daily.     . clopidogrel (PLAVIX) 75 MG tablet TAKE ONE TABLET BY MOUTH ONCE DAILY WITH BREAKFAST. 30 tablet 2  . Insulin Detemir (LEVEMIR FLEXPEN) 100 UNIT/ML Pen Inject 45 Units into the skin daily at 10 pm. 15 mL 11  . Insulin Pen Needle (PEN NEEDLES) 31G X 6 MM MISC 1 each by Does not apply route daily. 100 each 3  . losartan-hydrochlorothiazide (HYZAAR) 100-25 MG per tablet Take 1 tablet by mouth daily.     . magnesium oxide (MAG-OX) 400 MG tablet Take 400 mg by mouth daily.    . metFORMIN (GLUCOPHAGE) 500 MG tablet Take 2 tablets (1,000 mg total) by mouth 2 (two) times daily with a meal. 120 tablet 5   No current facility-administered medications on file prior to visit.    Allergies  Allergen Reactions  . Betadine [Povidone Iodine]   . Contrast Media [Iodinated Diagnostic Agents]   . Fish Allergy Swelling   Social History   Social History  . Marital Status: Married    Spouse Name: N/A  . Number of Children: 3  . Years of Education: 12th   Occupational History  . retired    Social History Main Topics  . Smoking status: Never Smoker   . Smokeless tobacco: Never Used  . Alcohol Use: No  . Drug Use: No  . Sexual Activity: Not Currently   Other Topics Concern  . Not on file   Social History Narrative   [name]   Patient is right handed   Patient drinks coffee and sodas   Family History  Problem Relation Age of Onset  . Leukemia Mother   . Heart Problems Father   . Heart Problems      family history  . Diabetes Sister     ? father  . Heart Problems Brother   . Heart attack Neg Hx   . Stroke    . Stroke Brother   . Hypertension Brother   . Cancer Mother   . Cervical cancer Sister     ????      Review of Systems  All other systems reviewed and are negative.      Objective:   Physical Exam  Constitutional: He is oriented to person, place, and time. He appears well-developed and well-nourished. No distress.  HENT:  Head: Normocephalic and atraumatic.  Right Ear: External ear normal.  Left Ear: External ear normal.  Nose: Nose normal.  Mouth/Throat: Oropharynx is clear and moist. No oropharyngeal exudate.  Eyes: Conjunctivae and EOM are normal. Pupils are equal, round, and reactive to light. Right eye exhibits no discharge. Left eye exhibits no discharge. No scleral icterus.  Neck: Normal range of motion. Neck supple. No JVD present. No tracheal deviation present. No thyromegaly present.  Cardiovascular: Normal rate, regular rhythm, normal heart sounds and intact distal pulses.  Exam reveals no gallop and no friction rub.   No murmur heard. Pulmonary/Chest: Effort normal and breath sounds  normal. No stridor. No respiratory distress. He has no wheezes. He has no rales. He exhibits no tenderness.  Abdominal: Soft. Bowel sounds are normal. He exhibits no distension and no mass. There is no tenderness. There is no rebound and no guarding.  Musculoskeletal: Normal range of motion. He exhibits no edema or tenderness.  Lymphadenopathy:    He has no cervical adenopathy.  Neurological: He is alert and  oriented to person, place, and time. He has normal reflexes. He displays normal reflexes. No cranial nerve deficit. He exhibits normal muscle tone. Coordination normal.  Skin: Skin is warm. No rash noted. He is not diaphoretic. No erythema. No pallor.  Psychiatric: He has a normal mood and affect. His behavior is normal. Judgment and thought content normal.  Vitals reviewed.  6 mm black macular area on his right parietal scalp        Assessment & Plan:  Need for prophylactic vaccination and inoculation against influenza - Plan: Flu Vaccine QUAD 36+ mos IM  Routine general medical examination at a health care facility  Essential hypertension  HLD (hyperlipidemia)  Diabetes mellitus, type II, insulin dependent ([place])  I will send the patient home with fecal occult blood cards 3. If negative for blood, he can wait until 2018 for his colonoscopy. He will follow-up with his urologist regarding his prostate cancer tomorrow. His immunizations are up-to-date today. He did receive his annual flu shot. His blood pressures well controlled. His LDL cholesterol is well controlled. I recommended 5 days a week, 30 minutes a day of aerobic exercise to address his low HDL cholesterol. I recommended increasing his insulin to 55 units a day and rechecking fasting blood sugars and two-hour postprandial sugars and 2 weeks and titrating further at that time if necessary. The area on his right scalp concerns me. He will ask his barber if that has been there for a long time. If not I would recommend a biopsy  to rule out melanoma.

## 2014-12-27 ENCOUNTER — Telehealth: Payer: Self-pay | Admitting: Oncology

## 2014-12-27 ENCOUNTER — Other Ambulatory Visit (HOSPITAL_BASED_OUTPATIENT_CLINIC_OR_DEPARTMENT_OTHER): Payer: Medicare HMO

## 2014-12-27 ENCOUNTER — Ambulatory Visit (HOSPITAL_BASED_OUTPATIENT_CLINIC_OR_DEPARTMENT_OTHER): Payer: Medicare HMO | Admitting: Oncology

## 2014-12-27 ENCOUNTER — Ambulatory Visit: Payer: Medicare HMO

## 2014-12-27 VITALS — BP 129/71 | HR 69

## 2014-12-27 VITALS — BP 136/68 | HR 82 | Temp 98.2°F | Resp 18 | Ht 70.5 in | Wt 257.8 lb

## 2014-12-27 DIAGNOSIS — E785 Hyperlipidemia, unspecified: Secondary | ICD-10-CM

## 2014-12-27 DIAGNOSIS — I10 Essential (primary) hypertension: Secondary | ICD-10-CM

## 2014-12-27 DIAGNOSIS — D751 Secondary polycythemia: Secondary | ICD-10-CM

## 2014-12-27 DIAGNOSIS — I63412 Cerebral infarction due to embolism of left middle cerebral artery: Secondary | ICD-10-CM

## 2014-12-27 DIAGNOSIS — I668 Occlusion and stenosis of other cerebral arteries: Secondary | ICD-10-CM | POA: Diagnosis not present

## 2014-12-27 DIAGNOSIS — C61 Malignant neoplasm of prostate: Secondary | ICD-10-CM

## 2014-12-27 LAB — CBC WITH DIFFERENTIAL/PLATELET
BASO%: 0.2 % (ref 0.0–2.0)
Basophils Absolute: 0 10*3/uL (ref 0.0–0.1)
EOS%: 5.5 % (ref 0.0–7.0)
Eosinophils Absolute: 0.5 10*3/uL (ref 0.0–0.5)
HCT: 53.5 % — ABNORMAL HIGH (ref 38.4–49.9)
HGB: 16.3 g/dL (ref 13.0–17.1)
LYMPH%: 22.9 % (ref 14.0–49.0)
MCH: 25.5 pg — ABNORMAL LOW (ref 27.2–33.4)
MCHC: 30.5 g/dL — ABNORMAL LOW (ref 32.0–36.0)
MCV: 83.6 fL (ref 79.3–98.0)
MONO#: 0.9 10*3/uL (ref 0.1–0.9)
MONO%: 10.9 % (ref 0.0–14.0)
NEUT#: 4.9 10*3/uL (ref 1.5–6.5)
NEUT%: 60.5 % (ref 39.0–75.0)
Platelets: 240 10*3/uL (ref 140–400)
RBC: 6.4 10*6/uL — ABNORMAL HIGH (ref 4.20–5.82)
RDW: 18.9 % — ABNORMAL HIGH (ref 11.0–14.6)
WBC: 8.1 10*3/uL (ref 4.0–10.3)
lymph#: 1.9 10*3/uL (ref 0.9–3.3)

## 2014-12-27 LAB — COMPREHENSIVE METABOLIC PANEL (CC13)
ALT: 15 U/L (ref 0–55)
AST: 12 U/L (ref 5–34)
Albumin: 3.5 g/dL (ref 3.5–5.0)
Alkaline Phosphatase: 69 U/L (ref 40–150)
Anion Gap: 9 mEq/L (ref 3–11)
BUN: 22.1 mg/dL (ref 7.0–26.0)
CO2: 25 mEq/L (ref 22–29)
Calcium: 9.3 mg/dL (ref 8.4–10.4)
Chloride: 105 mEq/L (ref 98–109)
Creatinine: 1.4 mg/dL — ABNORMAL HIGH (ref 0.7–1.3)
EGFR: 60 mL/min/{1.73_m2} — ABNORMAL LOW (ref >90)
Glucose: 298 mg/dl — ABNORMAL HIGH (ref 70–140)
Potassium: 4.4 mEq/L (ref 3.5–5.1)
Sodium: 139 mEq/L (ref 136–145)
Total Bilirubin: 0.97 mg/dL (ref 0.20–1.20)
Total Protein: 6.9 g/dL (ref 6.4–8.3)

## 2014-12-27 NOTE — Progress Notes (Signed)
Hematology and Oncology Follow Up Visit  James Mcclain [id number] [birth year] 69 y.o. 12/27/2014 8:50 AM Mcclain,James TOM, MDPickard, James Mcgee, MD   Principle Diagnosis: 69 year old gentleman with polycythemia diagnosed in July 2015 after presenting with a hemoglobin of 20 and a JAK 2 positive mutation detected on February 2016. 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   Prior Therapy: He received intermittent phlebotomies between March and July 2016 to keep his hematocrit less than 50.  Current therapy: He continues to be on antiplatelets agents in the form of aspirin at 81 mg and Plavix daily.  Interim History: James Mcclain presents today for a follow-up visit. Since the last visit, he reports no new complaints. He has not had any thrombosis or bleeding episodes. He continues to be on aspirin and Plavix without any incident. He continues to drive and attends to activities of daily living.  He is completely asymptomatic. He did not report any complications from phlebotomy when he gets them. He did not have any dizziness or lightheadedness. He did not report any epistaxis or bleeding. No major changes in his performance status or activity level.  He does report occasional headaches and blurry vision. He did not have any syncope or seizures. He does report fatigue and tiredness and occasional dyspnea on exertion. He does not report any chest pain, palpitation, orthopnea. He does not report any cough, hemoptysis, wheezing or shortness of breath. He does not report any nausea, vomiting, abdominal pain, malaise satiety. He does not report any constipation, diarrhea, hematochezia or melena. He does not report any frequency urgency or hesitancy. He does not report any skeletal complaints. Rest of his review of systems unremarkable.   Medications: I have reviewed the patient's current medications.  Current Outpatient Prescriptions   Medication Sig Dispense Refill  . allopurinol (ZYLOPRIM) 100 MG tablet Take 100 mg by mouth daily.     [name] amLODipine (NORVASC) 10 MG tablet Take 10 mg by mouth daily.     [name] ascorbic acid (VITAMIN C) 1000 MG tablet Take 1,000 mg by mouth daily.    [name] aspirin 81 MG tablet Take 81 mg by mouth daily.    . ASSURE COMFORT LANCETS 30G MISC     . atorvastatin (LIPITOR) 40 MG tablet Take 40 mg by mouth daily.     [name] BAYER CONTOUR TEST test strip     . carvedilol (COREG) 12.5 MG tablet TAKE ONE TABLET BY MOUTH TWICE DAILY 60 tablet 2  . Cholecalciferol 1000 UNITS TBDP Take 1 tablet by mouth daily.     . clopidogrel (PLAVIX) 75 MG tablet TAKE ONE TABLET BY MOUTH ONCE DAILY WITH BREAKFAST. 30 tablet 2  . Insulin Detemir (LEVEMIR FLEXPEN) 100 UNIT/ML Pen Inject 45 Units into the skin daily at 10 pm. 15 mL 11  . Insulin Pen Needle (PEN NEEDLES) 31G X 6 MM MISC 1 each by Does not apply route daily. 100 each 3  . losartan-hydrochlorothiazide (HYZAAR) 100-25 MG per tablet Take 1 tablet by mouth daily.     . magnesium oxide (MAG-OX) 400 MG tablet Take 400 mg by mouth daily.    . metFORMIN (GLUCOPHAGE) 500 MG tablet Take 2 tablets (1,000 mg total) by mouth 2 (two) times daily with a meal. 120 tablet 5   No current facility-administered medications for this visit.     Allergies:  Allergies  Allergen Reactions  . Betadine [Povidone Iodine]   . Contrast  Media [Iodinated Diagnostic Agents]   . Fish Allergy Swelling    Past Medical History, Surgical history, Social history, and Family History were reviewed and updated.   Physical Exam: Blood pressure 136/68, pulse 82, temperature 98.2 F (36.8 C), temperature source Oral, resp. rate 18, height 5' 10.5" (1.791 m), weight 257 lb 12.8 oz (116.937 kg), SpO2 99 %. ECOG: 0 General appearance: alert and cooperative did not appear any distress. Head: Normocephalic, without obvious abnormality no oral ulcers or lesions. Neck: no adenopathy Lymph nodes:  Cervical, supraclavicular, and axillary nodes normal. Heart:regular rate and rhythm, S1, S2 normal, no murmur, click, rub or gallop Lung:chest clear, no wheezing, rales, normal symmetric air entry Abdomin: soft, non-tender, without masses or organomegaly EXT:no erythema, induration, or nodules Neurological examination: No deficits.  Lab Results: Lab Results  Component Value Date   WBC 8.1 12/27/2014   HGB 16.3 12/27/2014   HCT 53.5* 12/27/2014   MCV 83.6 12/27/2014   PLT 240 12/27/2014     Chemistry      Component Value Date/Time   NA 140 12/22/2014 0822   K 4.6 12/22/2014 0822   CL 101 12/22/2014 0822   CO2 29 12/22/2014 0822   BUN 23 12/22/2014 0822   CREATININE 1.37* 12/22/2014 0822   CREATININE 1.27* 09/25/2014 0830      Component Value Date/Time   CALCIUM 8.8 12/22/2014 0822   ALKPHOS 70 12/22/2014 0822   AST 13 12/22/2014 0822   ALT 16 12/22/2014 0822   BILITOT 0.9 12/22/2014 0822         Impression and Plan:  69 year old gentleman with the following issues:  1. 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 He did have an acute thrombotic event although it is unclear to me whether this event was precipitated by his hypertension and hyperlipidemia among other risk factors.   I have recommended continued phlebotomies to keep his hematocrit less than 50. He will proceed with phlebotomy today and we'll continue to monitor him on an intermittent basis.  2. Thrombosis prophylaxis: Is currently on aspirin and Plavix.  3. Follow-up: Will be in 3 months and a possible phlebotomy at that time.  SHADAD,FIRAS, MD 11/2/20168:50 AM

## 2014-12-27 NOTE — Telephone Encounter (Signed)
Gave adn printed appt sched and avs for pt for NOV, DEC and Feb 2017

## 2014-12-27 NOTE — Progress Notes (Signed)
500 ml therapeutic phlebotomy performed from L AC. Pt tolerated well. Sandwich and soda given beforehand. 30 minutes of observation. VSS.

## 2014-12-27 NOTE — Patient Instructions (Signed)
Therapeutic Phlebotomy, Care After Refer to this sheet in the next few weeks. These instructions provide you with information about caring for yourself after your procedure. Your health care provider may also give you more specific instructions. Your treatment has been planned according to current medical practices, but problems sometimes occur. Call your health care provider if you have any problems or questions after your procedure. WHAT TO EXPECT AFTER THE PROCEDURE After your procedure, it is common to have:  Light-headedness or dizziness. You may feel faint.  Nausea.  Tiredness. HOME CARE INSTRUCTIONS Activities  Return to your normal activities as directed by your health care provider. Most people can go back to their normal activities right away.  Avoid strenuous physical activity and heavy lifting or pulling for about 5 hours after the procedure. Do not lift anything that is heavier than 10 lb (4.5 kg).  Athletes should avoid strenuous exercise for at least 12 hours.  Change positions slowly for the remainder of the day. This will help to prevent light-headedness or fainting.  If you feel light-headed, lie down until the feeling goes away. Eating and Drinking  Be sure to eat well-balanced meals for the next 24 hours.  Drink enough fluid to keep your urine clear or pale yellow.  Avoid drinking alcohol on the day that you had the procedure. Care of the Needle Insertion Site  Keep your bandage dry. You can remove the bandage after about 5 hours or as directed by your health care provider.  If you have bleeding from the needle insertion site, elevate your arm and press firmly on the site until the bleeding stops.  If you have bruising at the site, apply ice to the area:  Put ice in a plastic bag.  Place a towel between your skin and the bag.  Leave the ice on for 20 minutes, 2-3 times a day for the first 24 hours.  If the swelling does not go away after 24 hours, apply  a warm, moist washcloth to the area for 20 minutes, 2-3 times a day. General Instructions  Avoid smoking for at least 30 minutes after the procedure.  Keep all follow-up visits as directed by your health care provider. It is important to continue with further therapeutic phlebotomy treatments as directed. SEEK MEDICAL CARE IF:  You have redness, swelling, or pain at the needle insertion site.  You have fluid, blood, or pus coming from the needle insertion site.  You feel light-headed, dizzy, or nauseated, and the feeling does not go away.  You notice new bruising at the needle insertion site.  You feel weaker than normal.  You have a fever or chills. SEEK IMMEDIATE MEDICAL CARE IF:  You have severe nausea or vomiting.  You have chest pain.  You have trouble breathing.   This information is not intended to replace advice given to you by your health care provider. Make sure you discuss any questions you have with your health care provider.   Document Released: 07/15/2010 Document Revised: 06/27/2014 Document Reviewed: 02/06/2014 Elsevier Interactive Patient Education 2016 [place].   Therapeutic Phlebotomy Therapeutic phlebotomy is the controlled removal of blood from a person's body for the purpose of treating a medical condition. The procedure is similar to donating blood. Usually, about a pint (470 mL, or 0.47L) of blood is removed. The average adult has 9-12 pints (4.3-5.7 L) of blood. Therapeutic phlebotomy may be used to treat the following medical conditions:  Hemochromatosis. This is a condition in which  the blood contains too much iron.  Polycythemia vera. This is a condition in which the blood contains too many red blood cells.  Porphyria cutanea tarda. This is a disease in which an important part of hemoglobin is not made properly. It results in the buildup of abnormal amounts of porphyrins in the body.  Sickle cell disease. This is a condition in which the  red blood cells form an abnormal crescent shape rather than a round shape. LET [facility] CARE PROVIDER KNOW ABOUT:  Any allergies you have.  All medicines you are taking, including vitamins, herbs, eye drops, creams, and over-the-counter medicines.  Previous problems you or members of your family have had with the use of anesthetics.  Any blood disorders you have.  Previous surgeries you have had.  Any medical conditions you may have. RISKS AND COMPLICATIONS Generally, this is a safe procedure. However, problems may occur, including:  Nausea or light-headedness.  Low blood pressure.  Soreness, bleeding, swelling, or bruising at the needle insertion site.  Infection. BEFORE THE PROCEDURE  Follow instructions from your health care provider about eating or drinking restrictions.  Ask your health care provider about changing or stopping your regular medicines. This is especially important if you are taking diabetes medicines or blood thinners.  Wear clothing with sleeves that can be raised above the elbow.  Plan to have someone take you home after the procedure.  You may have a blood sample taken. PROCEDURE  A needle will be inserted into one of your veins.  Tubing and a collection bag will be attached to that needle.  Blood will flow through the needle and tubing into the collection bag.  You may be asked to open and close your hand slowly and continually during the entire collection.  After the specified amount of blood has been removed from your body, the collection bag and tubing will be clamped.  The needle will be removed from your vein.  Pressure will be held on the site of the needle insertion to stop the bleeding.  A bandage (dressing) will be placed over the needle insertion site. The procedure may vary among health care providers and hospitals. AFTER THE PROCEDURE  Your recovery will be assessed and monitored.  You can return to your normal activities  as directed by your health care provider.   This information is not intended to replace advice given to you by your health care provider. Make sure you discuss any questions you have with your health care provider.   Document Released: 07/15/2010 Document Revised: 06/27/2014 Document Reviewed: 02/06/2014 Elsevier Interactive Patient Education [employer].

## 2015-01-03 ENCOUNTER — Ambulatory Visit (HOSPITAL_COMMUNITY)
Admission: RE | Admit: 2015-01-03 | Discharge: 2015-01-03 | Disposition: A | Discharge status: Home / Self Care | Payer: Medicare HMO | Source: Ambulatory Visit | Attending: Cardiology | Admitting: Cardiology

## 2015-01-03 DIAGNOSIS — I712 Thoracic aortic aneurysm, without rupture: Secondary | ICD-10-CM

## 2015-01-03 DIAGNOSIS — I7121 Aneurysm of the ascending aorta, without rupture: Secondary | ICD-10-CM

## 2015-01-19 LAB — CUP PACEART REMOTE DEVICE CHECK: Date Time Interrogation Session: 20161027120543

## 2015-01-19 NOTE — Progress Notes (Signed)
Carelink summary report received. Battery status OK. Normal device function. No new symptom episodes, tachy episodes, brady, or pause episodes. No new AF episodes. Monthly summary reports and ROV wtih GT in 07/2015.

## 2015-01-22 ENCOUNTER — Telehealth: Payer: Self-pay | Admitting: Internal Medicine

## 2015-01-22 ENCOUNTER — Ambulatory Visit (INDEPENDENT_AMBULATORY_CARE_PROVIDER_SITE_OTHER): Payer: Medicare HMO | Admitting: *Deleted

## 2015-01-22 DIAGNOSIS — I639 Cerebral infarction, unspecified: Secondary | ICD-10-CM

## 2015-01-22 NOTE — Telephone Encounter (Signed)
New Message  Pt calling to see if remote transmission was sent successfully. Pelase call back and discuss.

## 2015-01-22 NOTE — Telephone Encounter (Signed)
Spoke w/ pt wife and informed her that pt information was received. She verbalized understanding.

## 2015-01-25 NOTE — Progress Notes (Signed)
LOOP RECORDER  

## 2015-01-29 ENCOUNTER — Other Ambulatory Visit: Payer: Self-pay | Admitting: Family Medicine

## 2015-01-29 ENCOUNTER — Other Ambulatory Visit: Payer: Self-pay | Admitting: Cardiology

## 2015-01-29 NOTE — Telephone Encounter (Signed)
Evans Lance, MD at 11/21/2014 10:38 AM  clopidogrel (PLAVIX) 75 MG tabletTAKE ONE TABLET BY MOUTH ONCE DAILY WITH BREAKFAST Patient Instructions     Medication Instructions:  Your physician recommends that you continue on your current medications as directed. Please refer to the Current Medication list given to you today.

## 2015-02-15 ENCOUNTER — Ambulatory Visit: Payer: Medicare HMO

## 2015-02-15 ENCOUNTER — Ambulatory Visit
Admission: RE | Admit: 2015-02-15 | Discharge: 2015-02-15 | Disposition: A | Discharge status: Home / Self Care | Payer: Medicare HMO | Source: Ambulatory Visit | Attending: Radiation Oncology | Admitting: Radiation Oncology

## 2015-02-15 ENCOUNTER — Encounter: Payer: Self-pay | Admitting: *Deleted

## 2015-02-15 ENCOUNTER — Encounter: Payer: Self-pay | Admitting: Radiation Oncology

## 2015-02-15 ENCOUNTER — Telehealth: Payer: Self-pay | Admitting: *Deleted

## 2015-02-15 VITALS — BP 154/87 | HR 93 | Temp 97.6°F | Ht 70.5 in | Wt 244.1 lb

## 2015-02-15 DIAGNOSIS — C61 Malignant neoplasm of prostate: Secondary | ICD-10-CM

## 2015-02-15 NOTE — Progress Notes (Signed)
CC: Dr. Baruch Gouty, Dr. Jenna Luo, Dr. 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  History: James Mcclain is a pleasant 69 year old male who is seen today for review and scheduling of his external beam/IMRT in the management of his stage T1c intermediate risk adenocarcinoma prostate.  I first saw the patient consultation on 09/09/2012. His PSA rose to 8.4 by April 2014. One of 12 biopsies on 07/15/2012 contained Gleason 6 (3+3) adenocarcinoma involving just 20% of one core. Of note is that his prostate volume was 145.4 mL. He elected active surveillance and a repeat prostate biopsy on 09/23/2013 was diagnostic for Gleason 7 (3+4) adenocarcinoma from the left apex involving 30% of one core. His prostate volume was found to be 170 mL. 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  I last saw him for a follow-up visit on 12/05/2014.  Dr. Pilar Jarvis began androgen deprivation therapy on 12/07/2014.  He had placement of 3 gold seed markers on 02/12/2015.  No new GU or GI difficulties.  His I PSS score today is 6.  Physical examination: Alert and oriented. Filed Vitals:   02/15/15 0935  BP: 154/87  Pulse: 93  Temp: 97.6 F (36.4 C)   Rectal examination not performed today.  Impression: Stage TIc intermediate risk adenocarcinoma prostate.  We again discussed his management options.  He wishes to proceed with external beam/IMRT.  We again discussed bladder filling to minimize urinary toxicity.  We discussed the potential acute and late toxicities of radiation therapy and consent is obtained today.  I expect that 3 months of "downsizing" is satisfactory and he should be able to begin his radiation therapy in mid January.  I will get him set up for CT simulation with Dr. Tammi Klippel in early January.   Lastly, he appears to have some type of implanted cardiac monitoring device along his left anterior chest in that the patient says that this device is not a pacemaker.  We will investigate further.  Even if it is a pacemaker there should be noted problems giving him the pelvic radiation therapy.  Plan: As above.  30 minutes was spent face-to-face the patient, primarily counseling patient and coordinating his care.

## 2015-02-15 NOTE — Progress Notes (Signed)
Paperwork sent to Dr. Hillery Jacks office Devices area.  James Mcclain has paperwork.   Waiting on response regarding monitoring during treatment for prostate cancer.

## 2015-02-15 NOTE — Progress Notes (Signed)
No recent changes in urination pattern.   Denies any hot flashes with Anti-Androgen.  Injection on December 07, 2014

## 2015-02-15 NOTE — Telephone Encounter (Signed)
CALLED PATIENT TO ASK ABOUT COMING IN [facility] FOR HIS [facility] VISIT , PATIENT AGREED TO COME IN [facility]- 9:30 AM FOR THE NURSE AND 10 AM FOR THE DR.

## 2015-02-20 ENCOUNTER — Ambulatory Visit (INDEPENDENT_AMBULATORY_CARE_PROVIDER_SITE_OTHER): Payer: Medicare HMO | Admitting: *Deleted

## 2015-02-20 DIAGNOSIS — I639 Cerebral infarction, unspecified: Secondary | ICD-10-CM | POA: Diagnosis not present

## 2015-02-20 NOTE — Progress Notes (Signed)
Carelink Summary Report / Loop Recorder 

## 2015-03-02 ENCOUNTER — Ambulatory Visit
Admission: RE | Admit: 2015-03-02 | Discharge: 2015-03-02 | Disposition: A | Discharge status: Home / Self Care | Payer: Medicare HMO | Source: Ambulatory Visit | Attending: Radiation Oncology | Admitting: Radiation Oncology

## 2015-03-02 DIAGNOSIS — Z510 Encounter for antineoplastic radiation therapy: Secondary | ICD-10-CM | POA: Insufficient documentation

## 2015-03-02 DIAGNOSIS — C61 Malignant neoplasm of prostate: Secondary | ICD-10-CM | POA: Insufficient documentation

## 2015-03-02 NOTE — Progress Notes (Signed)
  Radiation Oncology         [phone] ________________________________  Name: James Mcclain  MRN: [id number]  Date: 03/02/2015  DOB: 04/12/1945  SIMULATION AND TREATMENT PLANNING NOTE    ICD-9-CM ICD-10-CM   1. Prostate cancer ([place]) 26 C61     DIAGNOSIS:  70 yo man with stage T1c adenocarcinoma prostate with a Gleason's score of 3+4 and a PSA of   NARRATIVE:  The patient was brought to the [facility].  Identity was confirmed.  All relevant records and images related to the planned course of therapy were reviewed.  The patient freely provided informed written consent to proceed with treatment after reviewing the details related to the planned course of therapy. The consent form was witnessed and verified by the simulation staff.  Then, the patient was set-up in a stable reproducible supine position for radiation therapy.  A vacuum lock pillow device was custom fabricated to position his legs in a reproducible immobilized position.  Then, I performed a urethrogram under sterile conditions to identify the prostatic apex.  CT images were obtained.  Surface markings were placed.  The CT images were loaded into the planning software.  Then the prostate target and avoidance structures including the rectum, bladder, bowel and hips were contoured.  Treatment planning then occurred.  The radiation prescription was entered and confirmed.  A total of one complex treatment devices were fabricated. I have requested : Intensity Modulated Radiotherapy (IMRT) is medically necessary for this case for the following reason:  Rectal sparing.[name]  PLAN:  The patient will receive 78 Gy in 40 fractions.  ________________________________  Sheral Apley Tammi Klippel, M.D.  This document serves as a record of services personally performed by Tyler Pita, MD. It was created on his behalf by Arlyce Harman, a trained medical scribe. The creation of this record is based on the scribe's personal observations  and the provider's statements to them. This document has been checked and approved by the attending provider.

## 2015-03-04 LAB — CUP PACEART REMOTE DEVICE CHECK: Date Time Interrogation Session: 20161126123531

## 2015-03-05 DIAGNOSIS — Z510 Encounter for antineoplastic radiation therapy: Secondary | ICD-10-CM | POA: Diagnosis not present

## 2015-03-07 DIAGNOSIS — Z510 Encounter for antineoplastic radiation therapy: Secondary | ICD-10-CM | POA: Diagnosis not present

## 2015-03-13 ENCOUNTER — Ambulatory Visit
Admission: RE | Admit: 2015-03-13 | Discharge: 2015-03-13 | Disposition: A | Discharge status: Home / Self Care | Payer: Medicare HMO | Source: Ambulatory Visit | Attending: Radiation Oncology | Admitting: Radiation Oncology

## 2015-03-13 DIAGNOSIS — C61 Malignant neoplasm of prostate: Secondary | ICD-10-CM | POA: Insufficient documentation

## 2015-03-13 DIAGNOSIS — R972 Elevated prostate specific antigen [PSA]: Secondary | ICD-10-CM | POA: Insufficient documentation

## 2015-03-13 DIAGNOSIS — Z8673 Personal history of transient ischemic attack (TIA), and cerebral infarction without residual deficits: Secondary | ICD-10-CM

## 2015-03-13 DIAGNOSIS — Z510 Encounter for antineoplastic radiation therapy: Secondary | ICD-10-CM

## 2015-03-14 ENCOUNTER — Ambulatory Visit
Admission: RE | Admit: 2015-03-14 | Discharge: 2015-03-14 | Disposition: A | Discharge status: Home / Self Care | Payer: Medicare HMO | Source: Ambulatory Visit | Attending: Radiation Oncology | Admitting: Radiation Oncology

## 2015-03-14 DIAGNOSIS — Z510 Encounter for antineoplastic radiation therapy: Secondary | ICD-10-CM | POA: Diagnosis not present

## 2015-03-15 ENCOUNTER — Ambulatory Visit
Admission: RE | Admit: 2015-03-15 | Discharge: 2015-03-15 | Disposition: A | Discharge status: Home / Self Care | Payer: Medicare HMO | Source: Ambulatory Visit | Attending: Radiation Oncology | Admitting: Radiation Oncology

## 2015-03-15 ENCOUNTER — Other Ambulatory Visit: Payer: Self-pay

## 2015-03-15 ENCOUNTER — Emergency Department (HOSPITAL_COMMUNITY)
Admission: EM | Admit: 2015-03-15 | Discharge: 2015-03-15 | Disposition: A | Discharge status: Home / Self Care | Payer: Medicare HMO | Attending: Emergency Medicine | Admitting: Emergency Medicine

## 2015-03-15 ENCOUNTER — Emergency Department (HOSPITAL_COMMUNITY): Payer: Medicare HMO

## 2015-03-15 ENCOUNTER — Encounter (HOSPITAL_COMMUNITY): Payer: Self-pay | Admitting: Emergency Medicine

## 2015-03-15 VITALS — BP 156/84 | HR 77 | Resp 16 | Wt 246.6 lb

## 2015-03-15 DIAGNOSIS — M1990 Unspecified osteoarthritis, unspecified site: Secondary | ICD-10-CM | POA: Insufficient documentation

## 2015-03-15 DIAGNOSIS — E119 Type 2 diabetes mellitus without complications: Secondary | ICD-10-CM | POA: Insufficient documentation

## 2015-03-15 DIAGNOSIS — C61 Malignant neoplasm of prostate: Secondary | ICD-10-CM

## 2015-03-15 DIAGNOSIS — Z8546 Personal history of malignant neoplasm of prostate: Secondary | ICD-10-CM | POA: Insufficient documentation

## 2015-03-15 DIAGNOSIS — Z8673 Personal history of transient ischemic attack (TIA), and cerebral infarction without residual deficits: Secondary | ICD-10-CM | POA: Insufficient documentation

## 2015-03-15 DIAGNOSIS — I10 Essential (primary) hypertension: Secondary | ICD-10-CM | POA: Insufficient documentation

## 2015-03-15 DIAGNOSIS — Z7984 Long term (current) use of oral hypoglycemic drugs: Secondary | ICD-10-CM | POA: Diagnosis not present

## 2015-03-15 DIAGNOSIS — Z794 Long term (current) use of insulin: Secondary | ICD-10-CM | POA: Diagnosis not present

## 2015-03-15 DIAGNOSIS — Z7902 Long term (current) use of antithrombotics/antiplatelets: Secondary | ICD-10-CM | POA: Insufficient documentation

## 2015-03-15 DIAGNOSIS — I2510 Atherosclerotic heart disease of native coronary artery without angina pectoris: Secondary | ICD-10-CM | POA: Insufficient documentation

## 2015-03-15 DIAGNOSIS — R079 Chest pain, unspecified: Secondary | ICD-10-CM | POA: Diagnosis not present

## 2015-03-15 DIAGNOSIS — Z87442 Personal history of urinary calculi: Secondary | ICD-10-CM | POA: Insufficient documentation

## 2015-03-15 DIAGNOSIS — Z79899 Other long term (current) drug therapy: Secondary | ICD-10-CM | POA: Insufficient documentation

## 2015-03-15 DIAGNOSIS — E785 Hyperlipidemia, unspecified: Secondary | ICD-10-CM | POA: Diagnosis not present

## 2015-03-15 DIAGNOSIS — Z510 Encounter for antineoplastic radiation therapy: Secondary | ICD-10-CM | POA: Diagnosis not present

## 2015-03-15 DIAGNOSIS — Z7982 Long term (current) use of aspirin: Secondary | ICD-10-CM | POA: Insufficient documentation

## 2015-03-15 LAB — I-STAT TROPONIN, ED: Troponin i, poc: 0.01 ng/mL (ref 0.00–0.08)

## 2015-03-15 LAB — CBC
HCT: 52.7 % — ABNORMAL HIGH (ref 39.0–52.0)
Hemoglobin: 15.8 g/dL (ref 13.0–17.0)
MCH: 25.5 pg — ABNORMAL LOW (ref 26.0–34.0)
MCHC: 30 g/dL (ref 30.0–36.0)
MCV: 85 fL (ref 78.0–100.0)
Platelets: 291 10*3/uL (ref 150–400)
RBC: 6.2 MIL/uL — ABNORMAL HIGH (ref 4.22–5.81)
RDW: 18.9 % — ABNORMAL HIGH (ref 11.5–15.5)
WBC: 10.6 10*3/uL — ABNORMAL HIGH (ref 4.0–10.5)

## 2015-03-15 LAB — BASIC METABOLIC PANEL
Anion gap: 9 (ref 5–15)
BUN: 22 mg/dL — ABNORMAL HIGH (ref 6–20)
CO2: 28 mmol/L (ref 22–32)
Calcium: 9.2 mg/dL (ref 8.9–10.3)
Chloride: 107 mmol/L (ref 101–111)
Creatinine, Ser: 1.3 mg/dL — ABNORMAL HIGH (ref 0.61–1.24)
GFR calc Af Amer: >60 mL/min (ref >60)
GFR calc non Af Amer: 55 mL/min — ABNORMAL LOW (ref >60)
Glucose, Bld: 161 mg/dL — ABNORMAL HIGH (ref 65–99)
Potassium: 4.1 mmol/L (ref 3.5–5.1)
Sodium: 144 mmol/L (ref 135–145)

## 2015-03-15 NOTE — Discharge Instructions (Signed)
Please obtain all of your results from medical records or have your doctors office obtain the results - share them with your doctor - you should be seen at your doctors office in the next 2 days. Call today to arrange your follow up. Take the medications as prescribed. Please review all of the medicines and only take them if you do not have an allergy to them. Please be aware that if you are taking birth control pills, taking other prescriptions, ESPECIALLY ANTIBIOTICS may make the birth control ineffective - if this is the case, either do not engage in sexual activity or use alternative methods of birth control such as condoms until you have finished the medicine and your family doctor says it is OK to restart them. If you are on a blood thinner such as COUMADIN, be aware that any other medicine that you take may cause the coumadin to either work too much, or not enough - you should have your coumadin level rechecked in next 7 days if this is the case.  ?  It is also a possibility that you have an allergic reaction to any of the medicines that you have been prescribed - Everybody reacts differently to medications and while MOST people have no trouble with most medicines, you may have a reaction such as nausea, vomiting, rash, swelling, shortness of breath. If this is the case, please stop taking the medicine immediately and contact your physician.  ?  You should return to the ER if you develop severe or worsening symptoms.    Chest Pain Observation It is often hard to give a specific diagnosis for the cause of chest pain. Among other possibilities your symptoms might be caused by inadequate oxygen delivery to your heart (angina). Angina that is not treated or evaluated can lead to a heart attack (myocardial infarction) or death. Blood tests, electrocardiograms, and X-rays may have been done to help determine a possible cause of your chest pain. After evaluation and observation, your health care provider  has determined that it is unlikely your pain was caused by an unstable condition that requires hospitalization. However, a full evaluation of your pain may need to be completed, with additional diagnostic testing as directed. It is very important to keep your follow-up appointments. Not keeping your follow-up appointments could result in permanent heart damage, disability, or death. If there is any problem keeping your follow-up appointments, you must call your health care provider. HOME CARE INSTRUCTIONS  Due to the slight chance that your pain could be angina, it is important to follow your health care provider's treatment plan and also maintain a healthy lifestyle:  Maintain or work toward achieving a healthy weight.  Stay physically active and exercise regularly.  Decrease your salt intake.  Eat a balanced, healthy diet. Talk to a dietitian to learn about heart-healthy foods.  Increase your fiber intake by including whole grains, vegetables, fruits, and nuts in your diet.  Avoid situations that cause stress, anger, or depression.  Take medicines as advised by your health care provider. Report any side effects to your health care provider. Do not stop medicines or adjust the dosages on your own.  Quit smoking. Do not use nicotine patches or gum until you check with your health care provider.  Keep your blood pressure, blood sugar, and cholesterol levels within normal limits.  Limit alcohol intake to no more than 1 drink per day for women who are not pregnant and 2 drinks per day for men.  Do  not abuse drugs. SEEK IMMEDIATE MEDICAL CARE IF: You have severe chest pain or pressure which may include symptoms such as:  You feel pain or pressure in your arms, neck, jaw, or back.  You have severe back or abdominal pain, feel sick to your stomach (nauseous), or throw up (vomit).  You are sweating profusely.  You are having a fast or irregular heartbeat.  You feel short of breath while  at rest.  You notice increasing shortness of breath during rest, sleep, or with activity.  You have chest pain that does not get better after rest or after taking your usual medicine.  You wake from sleep with chest pain.  You are unable to sleep because you cannot breathe.  You develop a frequent cough or you are coughing up blood.  You feel dizzy, faint, or experience extreme fatigue.  You develop severe weakness, dizziness, fainting, or chills. Any of these symptoms may represent a serious problem that is an emergency. Do not wait to see if the symptoms will go away. Call your local emergency services (911 in the [place] Do not drive yourself to the hospital. MAKE SURE YOU:  Understand these instructions.  Will watch your condition.  Will get help right away if you are not doing well or get worse.   This information is not intended to replace advice given to you by your health care provider. Make sure you discuss any questions you have with your health care provider.   Document Released: 03/15/2010 Document Revised: 02/15/2013 Document Reviewed: 08/12/2012 Elsevier Interactive Patient Education [employer].

## 2015-03-15 NOTE — ED Notes (Addendum)
Pt states he woke up this morning with right sided chest tightness. States it's been intermittent throughout the day, especially when exerting himself. Denies SOB, N/V, back pain. Had third dose of radiation for prostate cancer today.

## 2015-03-15 NOTE — Progress Notes (Addendum)
Weight and vitals stable. Denies pain. Reports nocturia x 3. Denies dysuria or hematuria. Reports intermittent diarrhea associated with effects of metformin. Describes his urine stream alternates between weak and strong. Reports rare occasional of urinary incontinence. Denies difficulty emptying his bladder. Denies fatigue. Oriented patient to staff and routine of the clinic. Provided patient with RADIATION THERAPY [facility] handbook then, reviewed pertinent information. Educated patient reference potential side effects and management such as fatigue, diarrhea, and urinary bladder changes. Answered all patient questions to the best of my ability. Provided patient with my business card and encouraged him to call with needs. Patient verbalized understanding of all reviewed.    BP 156/84 mmHg  Pulse 77  Resp 16  Wt 246 lb 9.6 oz (111.857 kg)  SpO2 100% Wt Readings from Last 3 Encounters:  03/15/15 246 lb 9.6 oz (111.857 kg)  02/15/15 244 lb 1.6 oz (110.723 kg)  12/27/14 257 lb 12.8 oz (116.937 kg)

## 2015-03-15 NOTE — ED Provider Notes (Signed)
CSN: [id number]     Arrival date & time 03/15/15  1543 History   First MD Initiated Contact with Patient 03/15/15 2029     Chief Complaint  Patient presents with  . Chest Pain     (Consider location/radiation/quality/duration/timing/severity/associated sxs/prior Treatment) HPI  The patient is a 70 year old male, he has a known history of coronary disease status post heart catheterization in June 2014 who presents today approximately one week after his wife died, funeral was on "Sunday, he has had increased stress and anxiety recently. He reports that while he is laying down he has intermittent pains in the right side of his chest the last 1-2 seconds and then go away. It feels maybe like there's a muscle contraction, it is not exertional, it is not positional, it is not related with deep breathing. He has no swelling in the legs. Symptoms are intermittent over the last couple of days.  He does report having occasional coughing over the last couple of weeks.  Past Medical History  Diagnosis Date  . Arthritis   . Essential hypertension   . Elevated PSA   . Coronary artery disease     a. s/p Xience DES to the LAD 07/2012.  . Prostate cancer (HCC) 07/15/12  . Insulin dependent diabetes mellitus (HCC)   . Stroke (HCC)     a. Cryptogenic, 09/2013, event monitor with NSR. Small PFO noted on echo but neg LE duplex.  . PVC's (premature ventricular contractions)   . PFO (patent foramen ovale)     a. By echo 09/2013.  . Dilated aortic root (HCC)     a. Mildly dilated by echo 09/2013, f/u MRA scheduled for 12/2014.  . Hyperlipidemia   . 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 On tele 09/2013.  . Polycythemia   . Hx of gout   . History of kidney stones    Past Surgical History  Procedure Laterality Date  . Kidney stones      20" 06 - LITHOTRIPSY  . Tonsillectomy      as adult  . Total knee arthroplasty  01/24/2011    Procedure: TOTAL KNEE ARTHROPLASTY;  Surgeon: Alta Corning;  Location: [facility];  Service: Orthopedics;  Laterality: Left;  COMPUTER ASSISTED LEFT  TOTAL KNEE REPLACEMENT. Anesthesia a combination of regional and general.  . Coronary angioplasty with stent placement  08/05/2012    LAD, 1 stent  . Prostate biopsy  07/15/2012  . Tee without cardioversion N/A 09/27/2013    Procedure: TRANSESOPHAGEAL ECHOCARDIOGRAM (TEE);  Surgeon: Larey Dresser, MD;  Location: [facility] ENDOSCOPY;  Service: Cardiovascular;  Laterality: N/A;  . Percutaneous coronary stent intervention (pci-s) N/A 08/05/2012    Procedure: PERCUTANEOUS CORONARY STENT INTERVENTION (PCI-S);  Surgeon: Sherren Mocha, MD;  Location: [facility] CATH LAB;  Service: Cardiovascular;  Laterality: N/A;  . Ep implantable device N/A 08/23/2014    Procedure: Loop Recorder Insertion;  Surgeon: Evans Lance, MD;  Location: [facility] CV LAB;  Service: Cardiovascular;  Laterality: N/A;  . Prostate biopsy  09/23/2013   Family History  Problem Relation Age of Onset  . Leukemia Mother   . Heart Problems Father   . Heart Problems      family history  . Diabetes Sister     ? father  . Heart Problems Brother   . Heart attack Neg Hx   . Stroke    . Stroke Brother   . Hypertension Brother   . Cancer Mother   . Cervical  cancer Sister     ????   Social History  Substance Use Topics  . Smoking status: Never Smoker   . Smokeless tobacco: Never Used  . Alcohol Use: No    Review of Systems  All other systems reviewed and are negative.     Allergies  Betadine; Contrast media; and Fish allergy  Home Medications   Prior to Admission medications   Medication Sig Start Date End Date Taking? Authorizing Provider  allopurinol (ZYLOPRIM) 100 MG tablet TAKE TWO TABLETS BY MOUTH ONCE DAILY 01/29/15   Susy Frizzle, MD  amLODipine (NORVASC) 10 MG tablet Take 10 mg by mouth daily.  08/27/14   Historical Provider, MD  ascorbic acid (VITAMIN C) 1000 MG tablet Take 1,000 mg by mouth daily. Reported on 02/15/2015    Historical Provider, MD   aspirin 81 MG tablet Take 81 mg by mouth daily.    Historical Provider, MD  ASSURE COMFORT LANCETS 30G [place]  11/30/14   Historical Provider, MD  atorvastatin (LIPITOR) 40 MG tablet TAKE ONE TABLET BY MOUTH DAILY AT  6 PM. 01/29/15   Susy Frizzle, MD  BAYER CONTOUR TEST test strip  11/30/14   Historical Provider, MD  carvedilol (COREG) 12.5 MG tablet TAKE ONE TABLET BY MOUTH TWICE DAILY 12/25/14   Larey Dresser, MD  Cholecalciferol 1000 UNITS TBDP Take 1 tablet by mouth daily.     Historical Provider, MD  clopidogrel (PLAVIX) 75 MG tablet TAKE ONE TABLET BY MOUTH ONCE DAILY WITH BREAKFAST. 01/29/15   Evans Lance, MD  Insulin Detemir (LEVEMIR FLEXPEN) 100 UNIT/ML Pen Inject 45 Units into the skin daily at 10 pm. 09/13/14   Susy Frizzle, MD  Insulin Pen Needle (PEN NEEDLES) 31G X 6 MM MISC 1 each by Does not apply route daily. 09/11/14   Susy Frizzle, MD  losartan-hydrochlorothiazide (HYZAAR) 100-25 MG per tablet Take 1 tablet by mouth daily.  08/20/14   Historical Provider, MD  magnesium oxide (MAG-OX) 400 MG tablet Take 400 mg by mouth daily.    Historical Provider, MD  metFORMIN (GLUCOPHAGE) 500 MG tablet Take 2 tablets (1,000 mg total) by mouth 2 (two) times daily with a meal. 09/11/14   Susy Frizzle, MD   BP 151/82 mmHg  Pulse 69  Temp(Src) 98.2 F (36.8 C) (Oral)  Resp 19  SpO2 100% Physical Exam  Constitutional: He appears well-developed and well-nourished. No distress.  HENT:  Head: Normocephalic and atraumatic.  Mouth/Throat: Oropharynx is clear and moist. No oropharyngeal exudate.  Eyes: Conjunctivae and EOM are normal. Pupils are equal, round, and reactive to light. Right eye exhibits no discharge. Left eye exhibits no discharge. No scleral icterus.  Neck: Normal range of motion. Neck supple. No JVD present. No thyromegaly present.  Cardiovascular: Normal rate, regular rhythm, normal heart sounds and intact distal pulses.  Exam reveals no gallop and no friction rub.    No murmur heard. Pulmonary/Chest: Effort normal and breath sounds normal. No respiratory distress. He has no wheezes. He has no rales. He exhibits no tenderness.  Abdominal: Soft. Bowel sounds are normal. He exhibits no distension and no mass. There is no tenderness.  Musculoskeletal: Normal range of motion. He exhibits no edema or tenderness.  Lymphadenopathy:    He has no cervical adenopathy.  Neurological: He is alert. Coordination normal.  Skin: Skin is warm and dry. No rash noted. No erythema.  Psychiatric: He has a normal mood and affect. His behavior is normal.  Nursing note and vitals reviewed.   ED Course  Procedures (including critical care time) Labs Review Labs Reviewed  BASIC METABOLIC PANEL - Abnormal; Notable for the following:    Glucose, Bld 161 (*)    BUN 22 (*)    Creatinine, Ser 1.30 (*)    GFR calc non Af Amer 55 (*)    All other components within normal limits  CBC - Abnormal; Notable for the following:    WBC 10.6 (*)    RBC 6.20 (*)    HCT 52.7 (*)    MCH 25.5 (*)    RDW 18.9 (*)    All other components within normal limits  I-STAT TROPOININ, ED    Imaging Review Dg Chest 2 View  03/15/2015  CLINICAL DATA:  Right-sided chest pain/ tightness. EXAM: CHEST  2 VIEW COMPARISON:  06/01/2014 FINDINGS: The cardiac silhouette is within normal limits. Tortuosity of the thoracic aorta is unchanged. A loop recorder is noted. The lungs are well inflated and clear. No pleural effusion or pneumothorax is identified. Thoracic spondylosis is noted. IMPRESSION: No active cardiopulmonary disease. Electronically Signed   By: Logan Bores M.D.   On: 03/15/2015 16:17   I have personally reviewed and evaluated these images and lab results as part of my medical decision-making.   EKG Interpretation   Date/Time:  Thursday March 15 2015 16:01:10 EST Ventricular Rate:  78 PR Interval:  170 QRS Duration: 112 QT Interval:  394 QTC Calculation: 449 R Axis:   -67 Text  Interpretation:  Sinus rhythm Borderline IVCD with LAD Inferior  infarct, old Consider anterior infarct since last tracing no significant  change Confirmed by Calogero Geisen  MD, Urho Rio ([id number]) on 03/15/2015 8:38:23 PM      MDM   Final diagnoses:  Chest pain, unspecified chest pain type    The patient is well-appearing, vital signs show mild hypertension however his symptoms do not appear to be cardiac in nature, short-lived sharp right-sided chest pain the last 1-2 seconds does not seem consistent with a more exertional symptoms or even unstable angina. With an EKG that is totally unchanged as well as labs that show negative troponin and baseline creatinine I do not think that the patient needs to be admitted to the hospital. He was given verbal discharge instructions that he should follow up very closely with his family doctor and may need to see a heart doctor if his symptoms continue but at this time it does not appear to be cardiac. He expressed his understanding.     Noemi Chapel, MD 03/15/15 2118

## 2015-03-15 NOTE — Progress Notes (Signed)
  Radiation Oncology         [phone]   Name: James Mcclain MRN: [id number]   Date: 03/15/2015  DOB: 21-Feb-1946   Weekly Radiation Therapy Management    ICD-9-CM ICD-10-CM   1. Prostate cancer ([place]) 185 C61     Current Dose: 5.85Gy  Planned Dose:  78 Gy  Narrative The patient presents for routine under treatment assessment.  Weight and vitals stable. Denies pain. Reports nocturia x 3. Denies dysuria or hematuria. Reports intermittent diarrhea associated with effects of metformin. Describes his urine stream alternates between weak and strong. Reports rare occasional of urinary incontinence. Denies difficulty emptying his bladder. Denies fatigue.   The patient is without complaint. Set-up films were reviewed. The chart was checked.  Physical Findings  weight is 246 lb 9.6 oz (111.857 kg). His blood pressure is 156/84 and his pulse is 77. His respiration is 16 and oxygen saturation is 100%. . Weight essentially stable.  No significant changes.  Impression The patient is tolerating radiation.  Plan Continue treatment as planned.       Sheral Apley Tammi Klippel, M.D.  This document serves as a record of services personally performed by Tyler Pita, MD. It was created on his behalf by Jenell Milliner, a trained medical scribe. The creation of this record is based on the scribe's personal observations and the provider's statements to them. This document has been checked and approved by the attending provider.

## 2015-03-15 NOTE — ED Notes (Signed)
Pt left and came back. Slid patient back into waiting in chart.

## 2015-03-15 NOTE — ED Notes (Signed)
PA at bedside.

## 2015-03-16 ENCOUNTER — Ambulatory Visit
Admission: RE | Admit: 2015-03-16 | Discharge: 2015-03-16 | Disposition: A | Discharge status: Home / Self Care | Payer: Medicare HMO | Source: Ambulatory Visit | Attending: Radiation Oncology | Admitting: Radiation Oncology

## 2015-03-16 DIAGNOSIS — Z510 Encounter for antineoplastic radiation therapy: Secondary | ICD-10-CM | POA: Diagnosis not present

## 2015-03-19 ENCOUNTER — Ambulatory Visit
Admission: RE | Admit: 2015-03-19 | Discharge: 2015-03-19 | Disposition: A | Discharge status: Home / Self Care | Payer: Medicare HMO | Source: Ambulatory Visit | Attending: Radiation Oncology | Admitting: Radiation Oncology

## 2015-03-19 DIAGNOSIS — Z510 Encounter for antineoplastic radiation therapy: Secondary | ICD-10-CM | POA: Diagnosis not present

## 2015-03-20 ENCOUNTER — Ambulatory Visit
Admission: RE | Admit: 2015-03-20 | Discharge: 2015-03-20 | Disposition: A | Discharge status: Home / Self Care | Payer: Medicare HMO | Source: Ambulatory Visit | Attending: Radiation Oncology | Admitting: Radiation Oncology

## 2015-03-20 DIAGNOSIS — Z510 Encounter for antineoplastic radiation therapy: Secondary | ICD-10-CM | POA: Diagnosis not present

## 2015-03-21 ENCOUNTER — Ambulatory Visit (INDEPENDENT_AMBULATORY_CARE_PROVIDER_SITE_OTHER): Payer: Medicare HMO | Admitting: *Deleted

## 2015-03-21 ENCOUNTER — Ambulatory Visit
Admission: RE | Admit: 2015-03-21 | Discharge: 2015-03-21 | Disposition: A | Discharge status: Home / Self Care | Payer: Medicare HMO | Source: Ambulatory Visit | Attending: Radiation Oncology | Admitting: Radiation Oncology

## 2015-03-21 DIAGNOSIS — I639 Cerebral infarction, unspecified: Secondary | ICD-10-CM

## 2015-03-21 DIAGNOSIS — Z510 Encounter for antineoplastic radiation therapy: Secondary | ICD-10-CM | POA: Diagnosis not present

## 2015-03-21 NOTE — Progress Notes (Signed)
Carelink Summary Report / Loop Recorder 

## 2015-03-22 ENCOUNTER — Ambulatory Visit
Admission: RE | Admit: 2015-03-22 | Discharge: 2015-03-22 | Disposition: A | Discharge status: Home / Self Care | Payer: Medicare HMO | Source: Ambulatory Visit | Attending: Radiation Oncology | Admitting: Radiation Oncology

## 2015-03-22 VITALS — BP 142/79 | HR 72 | Resp 16 | Wt 246.6 lb

## 2015-03-22 DIAGNOSIS — Z510 Encounter for antineoplastic radiation therapy: Secondary | ICD-10-CM | POA: Diagnosis not present

## 2015-03-22 DIAGNOSIS — C61 Malignant neoplasm of prostate: Secondary | ICD-10-CM

## 2015-03-22 NOTE — Progress Notes (Signed)
Weight and vitals stable. Denies pain. Reports nocturia x 3. Denies hematuria. Reports an episode of dysuria but, increased fluid intake and this resolved. Reports intermittent diarrhea associated with effects of metformin. Describes his urine stream alternates between weak and strong. Reports rare occasional of urinary incontinence. Denies difficulty emptying his bladder. Denies fatigue.    BP 142/79 mmHg  Pulse 72  Resp 16  Wt 246 lb 9.6 oz (111.857 kg)  SpO2 100% Wt Readings from Last 3 Encounters:  03/22/15 246 lb 9.6 oz (111.857 kg)  03/15/15 246 lb 9.6 oz (111.857 kg)  02/15/15 244 lb 1.6 oz (110.723 kg)

## 2015-03-22 NOTE — Progress Notes (Signed)
  Radiation Oncology         [phone]   Name: James Mcclain MRN: [id number]   Date: 03/22/2015  DOB: 23-Nov-1945   Weekly Radiation Therapy Management    ICD-9-CM ICD-10-CM   1. Prostate cancer ([place]) 185 C61     Current Dose: 15.6 Gy  Planned Dose:  78 Gy  Narrative The patient presents for routine under treatment assessment.  On review of systems, James Mcclain states that he is doing well. He continues to report nocturia x 3 which is causing trouble with sleeping. Denies hematuria. Reports an episode of dysuria but, increased fluid intake and this resolved. Reports intermittent diarrhea associated with effects of metformin. Describes his urine stream alternates between weak and strong. Reports rare occasional of urinary incontinence. Denies difficulty emptying his bladder. Denies fatigue.   Set-up films were reviewed. The chart was checked.  Physical Findings  weight is 246 lb 9.6 oz (111.857 kg). His blood pressure is 142/79 and his pulse is 72. His respiration is 16 and oxygen saturation is 100%.   Pain scale 0/10 In general this is a well-appearing male in no acute distress. He is alert and oriented 4 and appropriate throughout the examination. Cardiac pulmonary assessment reveals no acute distress and he has normal effort.  Impression The patient is tolerating radiation.  Plan Continue treatment as planned. Suggested OTC sleep aid Melatonin (3 or 5 mg).    The above documentation reflects my direct findings during this shared patient visit. Please see the separate note by Dr. Tammi Klippel on this date for the remainder of the patient's plan of care.  Carola Rhine, PAC  This document serves as a record of services personally performed by Carola Rhine, PAC and Tyler Pita, MD. It was created on their behalf by Jenell Milliner, a trained medical scribe. The creation of this record is based on the scribe's personal observations and the provider's statements to them. This document  has been checked and approved by the attending provider.

## 2015-03-23 ENCOUNTER — Ambulatory Visit
Admission: RE | Admit: 2015-03-23 | Discharge: 2015-03-23 | Disposition: A | Discharge status: Home / Self Care | Payer: Medicare HMO | Source: Ambulatory Visit | Attending: Radiation Oncology | Admitting: Radiation Oncology

## 2015-03-23 DIAGNOSIS — Z510 Encounter for antineoplastic radiation therapy: Secondary | ICD-10-CM | POA: Diagnosis not present

## 2015-03-23 NOTE — Patient Instructions (Signed)
Contact our office if you have any questions following today's appointment: [phone].  

## 2015-03-26 ENCOUNTER — Ambulatory Visit
Admission: RE | Admit: 2015-03-26 | Discharge: 2015-03-26 | Disposition: A | Discharge status: Home / Self Care | Payer: Medicare HMO | Source: Ambulatory Visit | Attending: Radiation Oncology | Admitting: Radiation Oncology

## 2015-03-26 DIAGNOSIS — Z510 Encounter for antineoplastic radiation therapy: Secondary | ICD-10-CM | POA: Diagnosis not present

## 2015-03-27 ENCOUNTER — Ambulatory Visit
Admission: RE | Admit: 2015-03-27 | Discharge: 2015-03-27 | Disposition: A | Discharge status: Home / Self Care | Payer: Medicare HMO | Source: Ambulatory Visit | Attending: Radiation Oncology | Admitting: Radiation Oncology

## 2015-03-27 DIAGNOSIS — Z510 Encounter for antineoplastic radiation therapy: Secondary | ICD-10-CM | POA: Diagnosis not present

## 2015-03-28 ENCOUNTER — Ambulatory Visit
Admission: RE | Admit: 2015-03-28 | Discharge: 2015-03-28 | Disposition: A | Discharge status: Home / Self Care | Payer: Medicare HMO | Source: Ambulatory Visit | Attending: Radiation Oncology | Admitting: Radiation Oncology

## 2015-03-29 ENCOUNTER — Ambulatory Visit
Admission: RE | Admit: 2015-03-29 | Discharge: 2015-03-29 | Disposition: A | Discharge status: Home / Self Care | Payer: Medicare HMO | Source: Ambulatory Visit | Attending: Radiation Oncology | Admitting: Radiation Oncology

## 2015-03-29 DIAGNOSIS — Z510 Encounter for antineoplastic radiation therapy: Secondary | ICD-10-CM | POA: Insufficient documentation

## 2015-03-29 DIAGNOSIS — C61 Malignant neoplasm of prostate: Secondary | ICD-10-CM | POA: Diagnosis present

## 2015-03-29 NOTE — Addendum Note (Signed)
Encounter addended by: Heywood Footman, RN on: 03/29/2015 10:06 AM<BR>     Documentation filed: Notes Section, Chief Complaint Section

## 2015-03-30 ENCOUNTER — Ambulatory Visit
Admission: RE | Admit: 2015-03-30 | Discharge: 2015-03-30 | Disposition: A | Discharge status: Home / Self Care | Payer: Medicare HMO | Source: Ambulatory Visit | Attending: Radiation Oncology | Admitting: Radiation Oncology

## 2015-03-30 ENCOUNTER — Encounter: Payer: Self-pay | Admitting: Radiation Oncology

## 2015-03-30 VITALS — BP 177/95 | HR 87 | Temp 98.0°F | Resp 16 | Ht 70.5 in | Wt 246.1 lb

## 2015-03-30 DIAGNOSIS — Z510 Encounter for antineoplastic radiation therapy: Secondary | ICD-10-CM | POA: Diagnosis present

## 2015-03-30 DIAGNOSIS — C61 Malignant neoplasm of prostate: Secondary | ICD-10-CM | POA: Diagnosis present

## 2015-03-30 NOTE — Progress Notes (Signed)
James Mcclain has completed 14 fractions to his prostate.  He denies having pain.  He reports an increase in urinary frequency in the mornings.  He continues to get up 2 times per night to urinate.  He reports he did have dysuria but started drinking more water and it is gone.  He denies having hematuria.  He denies having bowel issues, skin irritation and fatigue.    BP 177/95 mmHg  Pulse 87  Temp(Src) 98 F (36.7 C) (Oral)  Resp 16  Ht 5' 10.5" (1.791 m)  Wt 246 lb 1.6 oz (111.63 kg)  BMI 34.80 kg/m2

## 2015-03-30 NOTE — Progress Notes (Signed)
  Radiation Oncology         [phone]   Name: James Mcclain MRN: [id number]   Date: 03/30/2015  DOB: 1945-06-25     Weekly Radiation Therapy Management    ICD-9-CM ICD-10-CM   1. Prostate cancer ([place]) 185 C61     Current Dose: 27.3 Gy  Planned Dose:  78 Gy  Narrative The patient presents for routine under treatment assessment.  James Mcclain has completed 14 fractions to his prostate. He denies having pain. He reports an increase in urinary frequency in the mornings. He continues to get up 2 times per night to urinate. He reports he did have dysuria but started drinking more water and it is improved. He denies having hematuria. He denies having bowel issues, skin irritation and fatigue.   The patient is without complaint. Set-up films were reviewed. The chart was checked.  Physical Findings  height is 5' 10.5" (1.791 m) and weight is 246 lb 1.6 oz (111.63 kg). His oral temperature is 98 F (36.7 C). His blood pressure is 177/95 and his pulse is 87. His respiration is 16. . Weight essentially stable.  No significant changes.  Impression The patient is tolerating radiation.  Plan Continue treatment as planned.       Sheral Apley Tammi Klippel, M.D.  This document serves as a record of services personally performed by Tyler Pita, MD. It was created on his behalf by Arlyce Harman, a trained medical scribe. The creation of this record is based on the scribe's personal observations and the provider's statements to them. This document has been checked and approved by the attending provider.

## 2015-03-31 LAB — CUP PACEART REMOTE DEVICE CHECK: Date Time Interrogation Session: 20161226123542

## 2015-04-02 ENCOUNTER — Ambulatory Visit
Admission: RE | Admit: 2015-04-02 | Discharge: 2015-04-02 | Disposition: A | Discharge status: Home / Self Care | Payer: Medicare HMO | Source: Ambulatory Visit | Attending: Radiation Oncology | Admitting: Radiation Oncology

## 2015-04-02 DIAGNOSIS — Z510 Encounter for antineoplastic radiation therapy: Secondary | ICD-10-CM | POA: Diagnosis not present

## 2015-04-03 ENCOUNTER — Ambulatory Visit
Admission: RE | Admit: 2015-04-03 | Discharge: 2015-04-03 | Disposition: A | Discharge status: Home / Self Care | Payer: Medicare HMO | Source: Ambulatory Visit | Attending: Radiation Oncology | Admitting: Radiation Oncology

## 2015-04-03 DIAGNOSIS — Z510 Encounter for antineoplastic radiation therapy: Secondary | ICD-10-CM | POA: Diagnosis not present

## 2015-04-04 ENCOUNTER — Other Ambulatory Visit: Payer: Self-pay | Admitting: Cardiology

## 2015-04-04 ENCOUNTER — Other Ambulatory Visit: Payer: Self-pay | Admitting: Family Medicine

## 2015-04-04 ENCOUNTER — Telehealth: Payer: Self-pay | Admitting: Oncology

## 2015-04-04 ENCOUNTER — Other Ambulatory Visit (HOSPITAL_BASED_OUTPATIENT_CLINIC_OR_DEPARTMENT_OTHER): Payer: Medicare HMO

## 2015-04-04 ENCOUNTER — Ambulatory Visit (HOSPITAL_BASED_OUTPATIENT_CLINIC_OR_DEPARTMENT_OTHER): Payer: Medicare HMO | Admitting: Oncology

## 2015-04-04 ENCOUNTER — Ambulatory Visit
Admission: RE | Admit: 2015-04-04 | Discharge: 2015-04-04 | Disposition: A | Discharge status: Home / Self Care | Payer: Medicare HMO | Source: Ambulatory Visit | Attending: Radiation Oncology | Admitting: Radiation Oncology

## 2015-04-04 VITALS — BP 151/76 | HR 73 | Temp 98.7°F | Resp 18 | Ht 70.5 in | Wt 244.1 lb

## 2015-04-04 DIAGNOSIS — C61 Malignant neoplasm of prostate: Secondary | ICD-10-CM | POA: Diagnosis not present

## 2015-04-04 DIAGNOSIS — D751 Secondary polycythemia: Secondary | ICD-10-CM | POA: Diagnosis not present

## 2015-04-04 DIAGNOSIS — E785 Hyperlipidemia, unspecified: Secondary | ICD-10-CM

## 2015-04-04 DIAGNOSIS — Z510 Encounter for antineoplastic radiation therapy: Secondary | ICD-10-CM | POA: Diagnosis not present

## 2015-04-04 DIAGNOSIS — I10 Essential (primary) hypertension: Secondary | ICD-10-CM | POA: Diagnosis not present

## 2015-04-04 LAB — COMPREHENSIVE METABOLIC PANEL
ALT: 18 U/L (ref 0–55)
AST: 17 U/L (ref 5–34)
Albumin: 3.3 g/dL — ABNORMAL LOW (ref 3.5–5.0)
Alkaline Phosphatase: 73 U/L (ref 40–150)
Anion Gap: 11 mEq/L (ref 3–11)
BUN: 17.1 mg/dL (ref 7.0–26.0)
CO2: 28 mEq/L (ref 22–29)
Calcium: 9.3 mg/dL (ref 8.4–10.4)
Chloride: 102 mEq/L (ref 98–109)
Creatinine: 1.2 mg/dL (ref 0.7–1.3)
EGFR: 69 mL/min/{1.73_m2} — ABNORMAL LOW (ref >90)
Glucose: 299 mg/dl — ABNORMAL HIGH (ref 70–140)
Potassium: 4.3 mEq/L (ref 3.5–5.1)
Sodium: 140 mEq/L (ref 136–145)
Total Bilirubin: 0.91 mg/dL (ref 0.20–1.20)
Total Protein: 7.3 g/dL (ref 6.4–8.3)

## 2015-04-04 LAB — CBC WITH DIFFERENTIAL/PLATELET
BASO%: 0.5 % (ref 0.0–2.0)
Basophils Absolute: 0 10*3/uL (ref 0.0–0.1)
EOS%: 4.6 % (ref 0.0–7.0)
Eosinophils Absolute: 0.3 10*3/uL (ref 0.0–0.5)
HCT: 53.1 % — ABNORMAL HIGH (ref 38.4–49.9)
HGB: 16.2 g/dL (ref 13.0–17.1)
LYMPH%: 11 % — ABNORMAL LOW (ref 14.0–49.0)
MCH: 25.8 pg — ABNORMAL LOW (ref 27.2–33.4)
MCHC: 30.6 g/dL — ABNORMAL LOW (ref 32.0–36.0)
MCV: 84.5 fL (ref 79.3–98.0)
MONO#: 0.5 10*3/uL (ref 0.1–0.9)
MONO%: 9.1 % (ref 0.0–14.0)
NEUT#: 4.4 10*3/uL (ref 1.5–6.5)
NEUT%: 74.8 % (ref 39.0–75.0)
Platelets: 227 10*3/uL (ref 140–400)
RBC: 6.29 10*6/uL — ABNORMAL HIGH (ref 4.20–5.82)
RDW: 21.2 % — ABNORMAL HIGH (ref 11.0–14.6)
WBC: 5.8 10*3/uL (ref 4.0–10.3)
lymph#: 0.6 10*3/uL — ABNORMAL LOW (ref 0.9–3.3)

## 2015-04-04 LAB — FERRITIN: Ferritin: 34 ng/ml (ref 22–316)

## 2015-04-04 NOTE — Progress Notes (Signed)
Hematology and Oncology Follow Up Visit  James Mcclain [id number] October 05, 1945 70 y.o. 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  Secondary diagnosis: He was diagnosed with prostate cancer and July 2015. He had a Gleason score of 3+4 = 7 and his PSA was up to 8.4.   Prior Therapy: He received intermittent phlebotomies between March and July 2016 to keep his hematocrit less than 50.  Current therapy:  He continues to be on antiplatelets agents in the form of aspirin at 81 mg and Plavix daily. He is currently receiving radiation therapy for his prostate cancer.  Interim History: James Mcclain presents today for a follow-up visit. Since the last visit, he reports doing very well without any recent issues or hospitalizations. He have started radiation therapy in January 2017 and have tolerated it well. He has not had any thrombosis or bleeding episodes. He continues to be on aspirin and Plavix without any incident. He continues to drive and attends to activities of daily living. He had not had any phlebotomies as of late. He denied any other neurological symptoms at this time.   He does report occasional headaches and blurry vision. He did not have any syncope or seizures. He does report fatigue and tiredness and occasional dyspnea on exertion. He does not report any chest pain, palpitation, orthopnea. He does not report any cough, hemoptysis, wheezing or shortness of breath. He does not report any nausea, vomiting, abdominal pain, malaise satiety. He does not report any constipation, diarrhea, hematochezia or melena. He does not report any frequency urgency or hesitancy. He does not report any  skeletal complaints. Rest of his review of systems unremarkable.   Medications: I have reviewed the patient's current medications.  Current Outpatient Prescriptions  Medication Sig Dispense Refill  . allopurinol (ZYLOPRIM) 100 MG tablet TAKE TWO TABLETS BY MOUTH ONCE DAILY 30 tablet 3  . amLODipine (NORVASC) 10 MG tablet Take 10 mg by mouth daily.     [name] ascorbic acid (VITAMIN C) 1000 MG tablet Take 1,000 mg by mouth daily. Reported on 03/30/2015    . aspirin 81 MG tablet Take 81 mg by mouth daily.    . ASSURE COMFORT LANCETS 30G MISC     . atorvastatin (LIPITOR) 40 MG tablet TAKE ONE TABLET BY MOUTH DAILY AT  6 PM. 90 tablet 0  . BAYER CONTOUR TEST test strip     . carvedilol (COREG) 12.5 MG tablet TAKE ONE TABLET BY MOUTH TWICE DAILY 60 tablet 2  . Cholecalciferol 1000 UNITS TBDP Take 1 tablet by mouth daily.     . clopidogrel (PLAVIX) 75 MG tablet TAKE ONE TABLET BY MOUTH ONCE DAILY WITH BREAKFAST. 90 tablet 2  . Insulin Detemir (LEVEMIR FLEXPEN) 100 UNIT/ML Pen Inject 45 Units into the skin daily at 10 pm. 15 mL 11  . Insulin Pen Needle (PEN NEEDLES) 31G X 6 MM MISC 1 each by Does not apply route daily. 100 each 3  . losartan-hydrochlorothiazide (HYZAAR) 100-25 MG per tablet Take 1 tablet by mouth daily.     . magnesium oxide (MAG-OX) 400 MG tablet Take 400 mg by mouth daily.    . metFORMIN (GLUCOPHAGE) 500 MG tablet Take 2 tablets (1,000  mg total) by mouth 2 (two) times daily with a meal. 120 tablet 5   No current facility-administered medications for this visit.     Allergies:  Allergies  Allergen Reactions  . Betadine [Povidone Iodine] Other (See Comments)    Patient doesn't remember  . Contrast Media [Iodinated Diagnostic Agents] Other (See Comments)    Patient doesn't remember  . Fish Allergy Swelling    Past Medical History, Surgical history, Social history, and Family History were reviewed and updated.   Physical Exam: Blood pressure 151/76, pulse 73, temperature 98.7  F (37.1 C), temperature source Oral, resp. rate 18, height 5' 10.5" (1.791 m), weight 244 lb 1.6 oz (110.723 kg), SpO2 100 %. ECOG: 0 General appearance: alert and cooperative without distress. Head: Normocephalic, without obvious abnormality no oral thrush noted. Neck: no adenopathy Lymph nodes: Cervical, supraclavicular, and axillary nodes normal. Heart:regular rate and rhythm, S1, S2 normal, no murmur, click, rub or gallop Lung:chest clear, no wheezing, rales, normal symmetric air entry Abdomin: soft, non-tender, without masses or organomegaly EXT:no erythema, induration, or nodules Neurological examination: No order or sensory deficits.  Lab Results: Lab Results  Component Value Date   WBC 5.8 04/04/2015   HGB 16.2 04/04/2015   HCT 53.1* 04/04/2015   MCV 84.5 04/04/2015   PLT 227 04/04/2015     Chemistry      Component Value Date/Time   NA 144 03/15/2015 1644   NA 139 12/27/2014 0816   K 4.1 03/15/2015 1644   K 4.4 12/27/2014 0816   CL 107 03/15/2015 1644   CO2 28 03/15/2015 1644   CO2 25 12/27/2014 0816   BUN 22* 03/15/2015 1644   BUN 22.1 12/27/2014 0816   CREATININE 1.30* 03/15/2015 1644   CREATININE 1.4* 12/27/2014 0816   CREATININE 1.37* 12/22/2014 0822      Component Value Date/Time   CALCIUM 9.2 03/15/2015 1644   CALCIUM 9.3 12/27/2014 0816   ALKPHOS 69 12/27/2014 0816   ALKPHOS 70 12/22/2014 0822   AST 12 12/27/2014 0816   AST 13 12/22/2014 0822   ALT 15 12/27/2014 0816   ALT 16 12/22/2014 0822   BILITOT 0.97 12/27/2014 0816   BILITOT 0.9 12/22/2014 0822         Impression and Plan:  70 year old gentleman with the following issues:  1. 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 He did have an acute thrombotic event although it is unclear to me whether this event was precipitated by his hypertension and hyperlipidemia among other risk factors.   He  is currently on aspirin and Plavix for thrombosis prophylaxis. We debated the role of continuing phlebotomy versus observation and surveillance for the time being given the fact that his hemoglobin is around normal range. We have elected to defer any further phlebotomies for the time being and institute them as needed.  2. Prostate cancer: He is currently receiving definitive radiation therapy.  3. Follow-up: Will be in 6 months for a clinical visit.  [facility], MD 2/8/20179:18 AM

## 2015-04-04 NOTE — Telephone Encounter (Signed)
Pt confirmed labs/ov per 02/08 POF, gave pt AVS and Calendar.... KJ °

## 2015-04-05 ENCOUNTER — Ambulatory Visit
Admission: RE | Admit: 2015-04-05 | Discharge: 2015-04-05 | Disposition: A | Discharge status: Home / Self Care | Payer: Medicare HMO | Source: Ambulatory Visit | Attending: Radiation Oncology | Admitting: Radiation Oncology

## 2015-04-05 VITALS — BP 165/98 | HR 78 | Resp 16 | Wt 243.0 lb

## 2015-04-05 DIAGNOSIS — Z510 Encounter for antineoplastic radiation therapy: Secondary | ICD-10-CM | POA: Diagnosis not present

## 2015-04-05 DIAGNOSIS — C61 Malignant neoplasm of prostate: Secondary | ICD-10-CM

## 2015-04-05 NOTE — Progress Notes (Signed)
  Radiation Oncology         [phone]   Name: James Mcclain MRN: [id number]   Date: 04/05/2015  DOB: [birth year]     Weekly Radiation Therapy Management    ICD-9-CM ICD-10-CM   1. Prostate cancer ([place]) 185 C61     Current Dose: 35.1 Gy  Planned Dose:  78 Gy  Narrative The patient presents for routine under treatment assessment.  Weight stable. BP elevated. Reports he isn't taking his aspirin or Plavix. Patient questions if he should resume aspirin and plavix. Denies pain. Reports nocturia x 2. Denies dysuria or hematuria. Reports urinary frequency worse in the mornings. Denies diarrhea or rectal irritation. Accompanied today by his two sisters. Denies fatigue.   The patient is without complaint. Set-up films were reviewed. The chart was checked.  Physical Findings  weight is 243 lb (110.224 kg). His blood pressure is 165/98 and his pulse is 78. His respiration is 16 and oxygen saturation is 100%. . Weight essentially stable.  No significant changes.  Impression The patient is tolerating radiation.  Plan Continue treatment as planned.    Sheral Apley Tammi Klippel, M.D.  This document serves as a record of services personally performed by Tyler Pita, MD. It was created on his behalf by Derek Mound, a trained medical scribe. The creation of this record is based on the scribe's personal observations and the provider's statements to them. This document has been checked and approved by the attending provider.

## 2015-04-05 NOTE — Progress Notes (Signed)
Weight stable. BP elevated. Reports he isn't taking his aspirin or Plavix. Patient questions if he should resume aspirin and plavix.  Denies pain. Reports nocturia x 2. Denies dysuria or hematuria. Reports urinary frequency worse in the mornings. Denies diarrhea or rectal irritation. Accompanied today by his two sisters. Denies fatigue.   BP 165/98 mmHg  Pulse 78 Wt Readings from Last 3 Encounters:  04/04/15 244 lb 1.6 oz (110.723 kg)  03/30/15 246 lb 1.6 oz (111.63 kg)  03/22/15 246 lb 9.6 oz (111.857 kg)

## 2015-04-06 ENCOUNTER — Ambulatory Visit
Admission: RE | Admit: 2015-04-06 | Discharge: 2015-04-06 | Disposition: A | Discharge status: Home / Self Care | Payer: Medicare HMO | Source: Ambulatory Visit | Attending: Radiation Oncology | Admitting: Radiation Oncology

## 2015-04-09 ENCOUNTER — Ambulatory Visit
Admission: RE | Admit: 2015-04-09 | Discharge: 2015-04-09 | Disposition: A | Discharge status: Home / Self Care | Payer: Medicare HMO | Source: Ambulatory Visit | Attending: Radiation Oncology | Admitting: Radiation Oncology

## 2015-04-09 DIAGNOSIS — Z510 Encounter for antineoplastic radiation therapy: Secondary | ICD-10-CM | POA: Diagnosis not present

## 2015-04-10 ENCOUNTER — Ambulatory Visit
Admission: RE | Admit: 2015-04-10 | Discharge: 2015-04-10 | Disposition: A | Discharge status: Home / Self Care | Payer: Medicare HMO | Source: Ambulatory Visit | Attending: Radiation Oncology | Admitting: Radiation Oncology

## 2015-04-10 DIAGNOSIS — Z510 Encounter for antineoplastic radiation therapy: Secondary | ICD-10-CM | POA: Diagnosis not present

## 2015-04-11 ENCOUNTER — Ambulatory Visit
Admission: RE | Admit: 2015-04-11 | Discharge: 2015-04-11 | Disposition: A | Discharge status: Home / Self Care | Payer: Medicare HMO | Source: Ambulatory Visit | Attending: Radiation Oncology | Admitting: Radiation Oncology

## 2015-04-11 DIAGNOSIS — Z510 Encounter for antineoplastic radiation therapy: Secondary | ICD-10-CM | POA: Diagnosis not present

## 2015-04-12 ENCOUNTER — Ambulatory Visit
Admission: RE | Admit: 2015-04-12 | Discharge: 2015-04-12 | Disposition: A | Discharge status: Home / Self Care | Payer: Medicare HMO | Source: Ambulatory Visit | Attending: Radiation Oncology | Admitting: Radiation Oncology

## 2015-04-12 DIAGNOSIS — Z510 Encounter for antineoplastic radiation therapy: Secondary | ICD-10-CM | POA: Diagnosis not present

## 2015-04-13 ENCOUNTER — Ambulatory Visit
Admission: RE | Admit: 2015-04-13 | Discharge: 2015-04-13 | Disposition: A | Discharge status: Home / Self Care | Payer: Medicare HMO | Source: Ambulatory Visit | Attending: Radiation Oncology | Admitting: Radiation Oncology

## 2015-04-13 VITALS — BP 174/77 | HR 67 | Resp 16 | Wt 243.6 lb

## 2015-04-13 DIAGNOSIS — Z510 Encounter for antineoplastic radiation therapy: Secondary | ICD-10-CM | POA: Diagnosis not present

## 2015-04-13 DIAGNOSIS — C61 Malignant neoplasm of prostate: Secondary | ICD-10-CM

## 2015-04-13 NOTE — Progress Notes (Signed)
Weight stable. BP elevated.  Denies pain. Reports nocturia x 2. Denies dysuria or hematuria. Reports urinary frequency worse in the mornings. Denies diarrhea or rectal irritation. Denies fatigue.  BP 174/77 mmHg  Pulse 67  Resp 16  Wt 243 lb 9.6 oz (110.496 kg)  SpO2 100% Wt Readings from Last 3 Encounters:  04/13/15 243 lb 9.6 oz (110.496 kg)  04/05/15 243 lb (110.224 kg)  04/04/15 244 lb 1.6 oz (110.723 kg)

## 2015-04-13 NOTE — Progress Notes (Signed)
  Radiation Oncology         [phone]   Name: James Mcclain MRN: [id number]   Date: 04/13/2015  DOB: 09-21-1945     Weekly Radiation Therapy Management    ICD-9-CM ICD-10-CM   1. Prostate cancer ([place]) 185 C61     Current Dose: 46.8 Gy  Planned Dose:  78 Gy  Narrative The patient presents for routine under treatment assessment.  Weight stable. BP elevated. Denies pain. Reports nocturia x 2. Denies dysuria or hematuria. Reports urinary frequency worse in the mornings. Denies diarrhea or rectal irritation. Denies fatigue.   The patient is without complaint. Set-up films were reviewed. The chart was checked.  Physical Findings  weight is 243 lb 9.6 oz (110.496 kg). His blood pressure is 174/77 and his pulse is 67. His respiration is 16 and oxygen saturation is 100%. . Weight essentially stable.  No significant changes.  Impression The patient is tolerating radiation.  Plan Continue treatment as planned.    Sheral Apley Tammi Klippel, M.D.  This document serves as a record of services personally performed by Tyler Pita, MD. It was created on his behalf by Derek Mound, a trained medical scribe. The creation of this record is based on the scribe's personal observations and the provider's statements to them. This document has been checked and approved by the attending provider.

## 2015-04-16 ENCOUNTER — Ambulatory Visit
Admission: RE | Admit: 2015-04-16 | Discharge: 2015-04-16 | Disposition: A | Discharge status: Home / Self Care | Payer: Medicare HMO | Source: Ambulatory Visit | Attending: Radiation Oncology | Admitting: Radiation Oncology

## 2015-04-16 DIAGNOSIS — Z510 Encounter for antineoplastic radiation therapy: Secondary | ICD-10-CM | POA: Diagnosis not present

## 2015-04-17 ENCOUNTER — Ambulatory Visit
Admission: RE | Admit: 2015-04-17 | Discharge: 2015-04-17 | Disposition: A | Discharge status: Home / Self Care | Payer: Medicare HMO | Source: Ambulatory Visit | Attending: Radiation Oncology | Admitting: Radiation Oncology

## 2015-04-17 ENCOUNTER — Ambulatory Visit: Payer: Medicare HMO | Admitting: Family Medicine

## 2015-04-17 VITALS — BP 134/86

## 2015-04-17 DIAGNOSIS — I10 Essential (primary) hypertension: Secondary | ICD-10-CM

## 2015-04-17 DIAGNOSIS — Z510 Encounter for antineoplastic radiation therapy: Secondary | ICD-10-CM | POA: Diagnosis not present

## 2015-04-17 NOTE — Addendum Note (Signed)
Addended by: Olena Mater on: 04/17/2015 11:32 AM   Modules accepted: Medications

## 2015-04-17 NOTE — Progress Notes (Signed)
Pt came for nurse visit BP check.  First BP 156/90 in lt arm.  Pt allowed to sit quietly for 5 min.  BP came down to 134/86.

## 2015-04-18 ENCOUNTER — Ambulatory Visit
Admission: RE | Admit: 2015-04-18 | Discharge: 2015-04-18 | Disposition: A | Discharge status: Home / Self Care | Payer: Medicare HMO | Source: Ambulatory Visit | Attending: Radiation Oncology | Admitting: Radiation Oncology

## 2015-04-18 DIAGNOSIS — Z510 Encounter for antineoplastic radiation therapy: Secondary | ICD-10-CM | POA: Diagnosis not present

## 2015-04-19 ENCOUNTER — Ambulatory Visit
Admission: RE | Admit: 2015-04-19 | Discharge: 2015-04-19 | Disposition: A | Discharge status: Home / Self Care | Payer: Medicare HMO | Source: Ambulatory Visit | Attending: Radiation Oncology | Admitting: Radiation Oncology

## 2015-04-19 DIAGNOSIS — Z510 Encounter for antineoplastic radiation therapy: Secondary | ICD-10-CM | POA: Diagnosis not present

## 2015-04-20 ENCOUNTER — Ambulatory Visit
Admission: RE | Admit: 2015-04-20 | Discharge: 2015-04-20 | Disposition: A | Discharge status: Home / Self Care | Payer: Medicare HMO | Source: Ambulatory Visit | Attending: Radiation Oncology | Admitting: Radiation Oncology

## 2015-04-20 ENCOUNTER — Ambulatory Visit (INDEPENDENT_AMBULATORY_CARE_PROVIDER_SITE_OTHER): Payer: Medicare HMO | Admitting: *Deleted

## 2015-04-20 VITALS — BP 180/96 | HR 75 | Resp 16 | Wt 244.0 lb

## 2015-04-20 DIAGNOSIS — Z510 Encounter for antineoplastic radiation therapy: Secondary | ICD-10-CM | POA: Diagnosis not present

## 2015-04-20 DIAGNOSIS — C61 Malignant neoplasm of prostate: Secondary | ICD-10-CM

## 2015-04-20 DIAGNOSIS — I639 Cerebral infarction, unspecified: Secondary | ICD-10-CM

## 2015-04-20 NOTE — Progress Notes (Signed)
Carelink Summary Report / Loop Recorder 

## 2015-04-20 NOTE — Progress Notes (Signed)
Weight stable. BP elevated. Denies pain. Reports nocturia x 2. Denies dysuria or hematuria. Reports urinary frequency worse in the mornings. Denies diarrhea or rectal irritation. Denies fatigue.  BP 180/96 mmHg  Pulse 75  Resp 16  Wt 244 lb (110.678 kg)  SpO2 100% Wt Readings from Last 3 Encounters:  04/20/15 244 lb (110.678 kg)  04/13/15 243 lb 9.6 oz (110.496 kg)  04/05/15 243 lb (110.224 kg)

## 2015-04-20 NOTE — Progress Notes (Signed)
  Radiation Oncology         [phone]   Name: James Mcclain MRN: [id number]   Date: 04/20/2015  DOB: [birth year]     Weekly Radiation Therapy Management    ICD-9-CM ICD-10-CM   1. Prostate cancer ([place]) 185 C61     Current Dose: 56.55 Gy  Planned Dose:  78 Gy  Narrative The patient presents for routine under treatment assessment.  Weight stable. BP elevated, notes eating bacon this morning. He takes BP medication and plans on seeing his PCP soon. Denies pain. Reports nocturia x 2. Denies dysuria or hematuria. Reports urinary frequency worse in the mornings. Denies diarrhea or rectal irritation. Denies fatigue.  The patient is without complaint. Set-up films were reviewed. The chart was checked.  Physical Findings  weight is 244 lb (110.678 kg). His blood pressure is 180/96 and his pulse is 75. His respiration is 16 and oxygen saturation is 100%. . Weight essentially stable.  No significant changes.  Impression The patient is tolerating radiation.  Plan Continue treatment as planned. PCP will follow the patient's BP.    Sheral Apley Tammi Klippel, M.D.  This document serves as a record of services personally performed by Tyler Pita, MD. It was created on his behalf by Arlyce Harman, a trained medical scribe. The creation of this record is based on the scribe's personal observations and the provider's statements to them. This document has been checked and approved by the attending provider.

## 2015-04-23 ENCOUNTER — Encounter: Payer: Self-pay | Admitting: Family Medicine

## 2015-04-23 ENCOUNTER — Ambulatory Visit
Admission: RE | Admit: 2015-04-23 | Discharge: 2015-04-23 | Disposition: A | Discharge status: Home / Self Care | Payer: Medicare HMO | Source: Ambulatory Visit | Attending: Radiation Oncology | Admitting: Radiation Oncology

## 2015-04-23 ENCOUNTER — Ambulatory Visit (INDEPENDENT_AMBULATORY_CARE_PROVIDER_SITE_OTHER): Payer: Medicare HMO | Admitting: Family Medicine

## 2015-04-23 VITALS — BP 130/78 | HR 82 | Temp 98.5°F | Resp 18 | Ht 70.0 in | Wt 238.0 lb

## 2015-04-23 DIAGNOSIS — Z794 Long term (current) use of insulin: Secondary | ICD-10-CM

## 2015-04-23 DIAGNOSIS — I10 Essential (primary) hypertension: Secondary | ICD-10-CM | POA: Diagnosis not present

## 2015-04-23 DIAGNOSIS — E119 Type 2 diabetes mellitus without complications: Secondary | ICD-10-CM | POA: Diagnosis not present

## 2015-04-23 DIAGNOSIS — Z510 Encounter for antineoplastic radiation therapy: Secondary | ICD-10-CM | POA: Diagnosis not present

## 2015-04-23 LAB — COMPLETE METABOLIC PANEL WITH GFR
ALT: 12 U/L (ref 9–46)
AST: 12 U/L (ref 10–35)
Albumin: 3.9 g/dL (ref 3.6–5.1)
Alkaline Phosphatase: 67 U/L (ref 40–115)
BUN: 33 mg/dL — ABNORMAL HIGH (ref 7–25)
CO2: 28 mmol/L (ref 20–31)
Calcium: 9.9 mg/dL (ref 8.6–10.3)
Chloride: 100 mmol/L (ref 98–110)
Creat: 1.42 mg/dL — ABNORMAL HIGH (ref 0.70–1.25)
GFR, Est African American: 58 mL/min — ABNORMAL LOW (ref >=60)
GFR, Est Non African American: 50 mL/min — ABNORMAL LOW (ref >=60)
Glucose, Bld: 159 mg/dL — ABNORMAL HIGH (ref 70–99)
Potassium: 4.4 mmol/L (ref 3.5–5.3)
Sodium: 139 mmol/L (ref 135–146)
Total Bilirubin: 0.9 mg/dL (ref 0.2–1.2)
Total Protein: 7 g/dL (ref 6.1–8.1)

## 2015-04-23 MED ORDER — DOXAZOSIN MESYLATE 4 MG PO TABS: 4.0000 mg | ORAL_TABLET | Freq: Every day | ORAL | Status: DC

## 2015-04-23 NOTE — Progress Notes (Signed)
Subjective:    Patient ID: James Mcclain, male    DOB: Jan 13, 1946, 70 y.o.   MRN: [id number]  HPI  patient's blood pressure has been consistently high recently. His blood pressures frequently between Q000111Q and 0000000 systolic over A999333. He has this checked at home as well as at the cancer center. Today I checked the blood pressure myself and found to be 130/78. Occasionally he will get 130 to 135/78-80.[name] However the majority of his blood pressures are raging between 130 and 160/0-90.   His last hemoglobin A1c was elevated in October. He admits that he is not checking his blood sugar. He denies any hypoglycemic episodes. He denies any polyuria, polydipsia, or blurred vision. Past medical history significant for coronary artery disease, CVA. Therefore his goal hemoglobin A1c will be less than 7 Past Medical History  Diagnosis Date  . Arthritis   . Essential hypertension   . Elevated PSA   . Coronary artery disease     a. s/p Xience DES to the LAD 07/2012.  . Prostate cancer ([place]) 07/15/12  . Insulin dependent diabetes mellitus ([place])   . Stroke [facility])     a. Cryptogenic, 09/2013, event monitor with NSR. Small PFO noted on echo but neg LE duplex.  [name] PVC's (premature ventricular contractions)   . PFO (patent foramen ovale)     a. By echo 09/2013.  . Dilated aortic root ([place])     a. Mildly dilated by echo 09/2013, f/u MRA scheduled for 12/2014.  [name] Hyperlipidemia   . 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 On tele 09/2013.  [name] Polycythemia   . Hx of gout   . History of kidney stones    Past Surgical History  Procedure Laterality Date  . Kidney stones      2006 - LITHOTRIPSY  . Tonsillectomy      as adult  . Total knee arthroplasty  01/24/2011    Procedure: TOTAL KNEE ARTHROPLASTY;  Surgeon: Alta Corning;  Location: [facility];  Service: Orthopedics;  Laterality: Left;  COMPUTER ASSISTED LEFT  TOTAL KNEE REPLACEMENT. Anesthesia a combination of regional and general.  . Coronary angioplasty with  stent placement  08/05/2012    LAD, 1 stent  . Prostate biopsy  07/15/2012  . Tee without cardioversion N/A 09/27/2013    Procedure: TRANSESOPHAGEAL ECHOCARDIOGRAM (TEE);  Surgeon: Larey Dresser, MD;  Location: [facility] ENDOSCOPY;  Service: Cardiovascular;  Laterality: N/A;  . Percutaneous coronary stent intervention (pci-s) N/A 08/05/2012    Procedure: PERCUTANEOUS CORONARY STENT INTERVENTION (PCI-S);  Surgeon: Sherren Mocha, MD;  Location: [facility] CATH LAB;  Service: Cardiovascular;  Laterality: N/A;  . Ep implantable device N/A 08/23/2014    Procedure: Loop Recorder Insertion;  Surgeon: Evans Lance, MD;  Location: [place] CV LAB;  Service: Cardiovascular;  Laterality: N/A;  . Prostate biopsy  09/23/2013   Current Outpatient Prescriptions on File Prior to Visit  Medication Sig Dispense Refill  . allopurinol (ZYLOPRIM) 100 MG tablet TAKE TWO TABLETS BY MOUTH ONCE DAILY 30 tablet 5  . amLODipine (NORVASC) 10 MG tablet Take 10 mg by mouth daily.     [name] ascorbic acid (VITAMIN C) 1000 MG tablet Take 1,000 mg by mouth daily. Reported on 03/30/2015    . aspirin 81 MG tablet Take 81 mg by mouth daily. Reported on 04/05/2015    . ASSURE COMFORT LANCETS 30G MISC     . atorvastatin (LIPITOR) 40 MG tablet TAKE ONE TABLET BY MOUTH DAILY  AT  6 PM. 90 tablet 0  . BAYER CONTOUR TEST test strip     . carvedilol (COREG) 12.5 MG tablet Take 1 tablet (12.5 mg total) by mouth 2 (two) times daily. 60 tablet 2  . Cholecalciferol 1000 UNITS TBDP Take 1 tablet by mouth daily.     . clopidogrel (PLAVIX) 75 MG tablet TAKE ONE TABLET BY MOUTH ONCE DAILY WITH BREAKFAST. 90 tablet 2  . Insulin Detemir (LEVEMIR FLEXPEN) 100 UNIT/ML Pen Inject 45 Units into the skin daily at 10 pm. (Patient taking differently: Inject 50 Units into the skin daily at 10 pm. ) 15 mL 11  . Insulin Pen Needle (PEN NEEDLES) 31G X 6 MM MISC 1 each by Does not apply route daily. 100 each 3  . losartan-hydrochlorothiazide (HYZAAR) 100-25 MG per tablet Take 1  tablet by mouth daily.     . magnesium oxide (MAG-OX) 400 MG tablet Take 400 mg by mouth daily.    . metFORMIN (GLUCOPHAGE) 500 MG tablet TAKE TWO TABLETS BY MOUTH TWICE DAILY WITH A MEAL (Patient taking differently: 2 TAB PO QAM  1 TAB PO QPM) 120 tablet 0   No current facility-administered medications on file prior to visit.   Allergies  Allergen Reactions  . Betadine [Povidone Iodine] Other (See Comments)    Patient doesn't remember  . Contrast Media [Iodinated Diagnostic Agents] Other (See Comments)    Patient doesn't remember  . Fish Allergy Swelling   Social History   Social History  . Marital Status: Married    Spouse Name: N/A  . Number of Children: 3  . Years of Education: 12th   Occupational History  . retired    Social History Main Topics  . Smoking status: Never Smoker   . Smokeless tobacco: Never Used  . Alcohol Use: No  . Drug Use: No  . Sexual Activity: Not Currently   Other Topics Concern  . Not on file   Social History Narrative   [name]   Patient is right handed   Patient drinks coffee and sodas      Review of Systems  All other systems reviewed and are negative.      Objective:   Physical Exam  Constitutional: He appears well-developed and well-nourished.  Cardiovascular: Normal rate, regular rhythm and normal heart sounds.   No murmur heard. Pulmonary/Chest: Effort normal and breath sounds normal. No respiratory distress. He has no wheezes. He has no rales.  Abdominal: Soft. Bowel sounds are normal.  Vitals reviewed.         Assessment & Plan:  Benign essential HTN - Plan: doxazosin (CARDURA) 4 MG tablet  Essential hypertension  Diabetes mellitus, type II, insulin dependent ([place]) - Plan: Hemoglobin A1c, COMPLETE METABOLIC PANEL WITH GFR   Blood pressure today is acceptable however the majority of his blood pressures are elevated at home. Therefore I'm going to start the patient on Cardura 4 mg by  mouth daily and recheck blood pressure in one month. I will also check a hemoglobin A1c. Goal hemoglobin A1c is less than 7.0

## 2015-04-24 ENCOUNTER — Ambulatory Visit
Admission: RE | Admit: 2015-04-24 | Discharge: 2015-04-24 | Disposition: A | Discharge status: Home / Self Care | Payer: Medicare HMO | Source: Ambulatory Visit | Attending: Radiation Oncology | Admitting: Radiation Oncology

## 2015-04-24 DIAGNOSIS — Z510 Encounter for antineoplastic radiation therapy: Secondary | ICD-10-CM | POA: Diagnosis not present

## 2015-04-24 LAB — HEMOGLOBIN A1C
Hgb A1c MFr Bld: 8.3 % — ABNORMAL HIGH (ref <5.7)
Mean Plasma Glucose: 192 mg/dL — ABNORMAL HIGH (ref <117)

## 2015-04-25 ENCOUNTER — Ambulatory Visit
Admission: RE | Admit: 2015-04-25 | Discharge: 2015-04-25 | Disposition: A | Discharge status: Home / Self Care | Payer: Medicare HMO | Source: Ambulatory Visit | Attending: Radiation Oncology | Admitting: Radiation Oncology

## 2015-04-25 DIAGNOSIS — C61 Malignant neoplasm of prostate: Secondary | ICD-10-CM | POA: Diagnosis present

## 2015-04-25 DIAGNOSIS — Z510 Encounter for antineoplastic radiation therapy: Secondary | ICD-10-CM | POA: Insufficient documentation

## 2015-04-26 ENCOUNTER — Ambulatory Visit
Admission: RE | Admit: 2015-04-26 | Discharge: 2015-04-26 | Disposition: A | Discharge status: Home / Self Care | Payer: Medicare HMO | Source: Ambulatory Visit | Attending: Radiation Oncology | Admitting: Radiation Oncology

## 2015-04-26 VITALS — BP 154/83 | HR 70 | Resp 16 | Wt 239.6 lb

## 2015-04-26 DIAGNOSIS — Z510 Encounter for antineoplastic radiation therapy: Secondary | ICD-10-CM | POA: Diagnosis not present

## 2015-04-26 DIAGNOSIS — C61 Malignant neoplasm of prostate: Secondary | ICD-10-CM

## 2015-04-26 NOTE — Progress Notes (Signed)
Weight and vitals stable. Reports his bp has improved greatly since starting Cardura. Denies pain. Reports nocturia x 2. Denies dysuria or hematuria. Reports urinary frequency worse in the mornings. Denies diarrhea or rectal irritation. Denies fatigue.   BP 154/83 mmHg  Pulse 70  Resp 16  Wt 239 lb 9.6 oz (108.682 kg)  SpO2 100% Wt Readings from Last 3 Encounters:  04/26/15 239 lb 9.6 oz (108.682 kg)  04/23/15 238 lb (107.956 kg)  04/20/15 244 lb (110.678 kg)

## 2015-04-26 NOTE — Progress Notes (Signed)
  Radiation Oncology         [phone]   Name: James Mcclain MRN: [id number]   Date: 04/26/2015  DOB: April 05, 1945     Weekly Radiation Therapy Management    ICD-9-CM ICD-10-CM   1. Prostate cancer ([place]) 185 C61     Current Dose: 64.35 Gy  Planned Dose:  78 Gy  Narrative The patient presents for routine under treatment assessment.  Weight and vitals stable. Reports his bp has improved greatly since starting Cardura. Denies pain. Reports nocturia x 2. Denies dysuria or hematuria. Reports urinary frequency worse in the mornings. Denies diarrhea or rectal irritation. Denies fatigue.   Set-up films were reviewed. The chart was checked.  Physical Findings  weight is 239 lb 9.6 oz (108.682 kg). His blood pressure is 154/83 and his pulse is 70. His respiration is 16 and oxygen saturation is 100%. . Weight essentially stable.  No significant changes.  Impression The patient is tolerating radiation.  Plan Continue treatment as planned. PCP will follow the patient's BP.    Sheral Apley Tammi Klippel, M.D.  This document serves as a record of services personally performed by Tyler Pita, MD. It was created on his behalf by Darcus Austin, a trained medical scribe. The creation of this record is based on the scribe's personal observations and the provider's statements to them. This document has been checked and approved by the attending provider.

## 2015-04-27 ENCOUNTER — Ambulatory Visit
Admission: RE | Admit: 2015-04-27 | Discharge: 2015-04-27 | Disposition: A | Discharge status: Home / Self Care | Payer: Medicare HMO | Source: Ambulatory Visit | Attending: Radiation Oncology | Admitting: Radiation Oncology

## 2015-04-27 DIAGNOSIS — Z510 Encounter for antineoplastic radiation therapy: Secondary | ICD-10-CM | POA: Diagnosis not present

## 2015-04-29 ENCOUNTER — Other Ambulatory Visit: Payer: Self-pay | Admitting: Family Medicine

## 2015-04-30 ENCOUNTER — Ambulatory Visit
Admission: RE | Admit: 2015-04-30 | Discharge: 2015-04-30 | Disposition: A | Discharge status: Home / Self Care | Payer: Medicare HMO | Source: Ambulatory Visit | Attending: Radiation Oncology | Admitting: Radiation Oncology

## 2015-04-30 DIAGNOSIS — Z510 Encounter for antineoplastic radiation therapy: Secondary | ICD-10-CM | POA: Diagnosis not present

## 2015-05-01 ENCOUNTER — Ambulatory Visit
Admission: RE | Admit: 2015-05-01 | Discharge: 2015-05-01 | Disposition: A | Discharge status: Home / Self Care | Payer: Medicare HMO | Source: Ambulatory Visit | Attending: Radiation Oncology | Admitting: Radiation Oncology

## 2015-05-01 DIAGNOSIS — Z510 Encounter for antineoplastic radiation therapy: Secondary | ICD-10-CM | POA: Diagnosis not present

## 2015-05-01 LAB — CUP PACEART REMOTE DEVICE CHECK: Date Time Interrogation Session: 20170125130522

## 2015-05-01 NOTE — Progress Notes (Signed)
Carelink summary report received. Battery status OK. Normal device function. No new symptom episodes, tachy episodes, brady, or pause episodes. No new AF episodes. Monthly summary reports and ROV/PRN 

## 2015-05-02 ENCOUNTER — Ambulatory Visit
Admission: RE | Admit: 2015-05-02 | Discharge: 2015-05-02 | Disposition: A | Discharge status: Home / Self Care | Payer: Medicare HMO | Source: Ambulatory Visit | Attending: Radiation Oncology | Admitting: Radiation Oncology

## 2015-05-02 DIAGNOSIS — Z510 Encounter for antineoplastic radiation therapy: Secondary | ICD-10-CM | POA: Diagnosis not present

## 2015-05-03 ENCOUNTER — Ambulatory Visit
Admission: RE | Admit: 2015-05-03 | Discharge: 2015-05-03 | Disposition: A | Discharge status: Home / Self Care | Payer: Medicare HMO | Source: Ambulatory Visit | Attending: Radiation Oncology | Admitting: Radiation Oncology

## 2015-05-03 ENCOUNTER — Encounter: Payer: Self-pay | Admitting: Radiation Oncology

## 2015-05-03 VITALS — BP 139/73 | HR 69 | Resp 16 | Wt 242.5 lb

## 2015-05-03 DIAGNOSIS — C61 Malignant neoplasm of prostate: Secondary | ICD-10-CM

## 2015-05-03 DIAGNOSIS — Z510 Encounter for antineoplastic radiation therapy: Secondary | ICD-10-CM | POA: Diagnosis not present

## 2015-05-03 NOTE — Progress Notes (Signed)
Weight and vitals stable. Reports his bp has improved greatly since starting Cardura. Denies pain. Reports nocturia x 2. Denies dysuria or hematuria. Reports urinary frequency worse in the mornings. Denies diarrhea or rectal irritation. Denies fatigue. One month follow up appointment card given. Understands to contact this RN with future needs. Does not presently have a follow up with [facility] arranged.   BP 139/73 mmHg  Pulse 69  Resp 16  Wt 242 lb 8 oz (109.997 kg) Wt Readings from Last 3 Encounters:  05/03/15 242 lb 8 oz (109.997 kg)  04/26/15 239 lb 9.6 oz (108.682 kg)  04/23/15 238 lb (107.956 kg)

## 2015-05-03 NOTE — Progress Notes (Signed)
  Radiation Oncology         [phone]   Name: James Mcclain MRN: [id number]   Date: 05/03/2015  DOB: [birth year]     Weekly Radiation Therapy Management    ICD-9-CM ICD-10-CM   1. Prostate cancer ([place]) 185 C61     Current Dose: 74.1 Gy  Planned Dose:  78 Gy  Narrative The patient presents for routine under treatment assessment.  Weight and vitals stable. Reports his bp has improved greatly since starting Cardura. Denies pain. Reports nocturia x 2. Denies dysuria or hematuria. Reports urinary frequency worse in the mornings. Denies diarrhea or rectal irritation. Denies fatigue. One month follow up appointment card given. Understands to contact this RN with future needs. Does not presently have a follow up with [facility] arranged.   Set-up films were reviewed. The chart was checked.  Physical Findings  weight is 242 lb 8 oz (109.997 kg). His blood pressure is 139/73 and his pulse is 69. His respiration is 16. . Weight essentially stable.  No significant changes.  Impression The patient is tolerating radiation.  Plan Continue treatment as planned. The patient was given a one month follow up appointment card.     Sheral Apley Tammi Klippel, M.D.  This document serves as a record of services personally performed by Tyler Pita, MD. It was created on his behalf by Arlyce Harman, a trained medical scribe. The creation of this record is based on the scribe's personal observations and the provider's statements to them. This document has been checked and approved by the attending provider.

## 2015-05-04 ENCOUNTER — Encounter: Payer: Self-pay | Admitting: *Deleted

## 2015-05-04 ENCOUNTER — Ambulatory Visit
Admission: RE | Admit: 2015-05-04 | Discharge: 2015-05-04 | Disposition: A | Discharge status: Home / Self Care | Payer: Medicare HMO | Source: Ambulatory Visit | Attending: Radiation Oncology | Admitting: Radiation Oncology

## 2015-05-04 DIAGNOSIS — Z510 Encounter for antineoplastic radiation therapy: Secondary | ICD-10-CM | POA: Diagnosis not present

## 2015-05-07 ENCOUNTER — Ambulatory Visit
Admission: RE | Admit: 2015-05-07 | Discharge: 2015-05-07 | Disposition: A | Discharge status: Home / Self Care | Payer: Medicare HMO | Source: Ambulatory Visit | Attending: Radiation Oncology | Admitting: Radiation Oncology

## 2015-05-07 ENCOUNTER — Encounter: Payer: Self-pay | Admitting: Radiation Oncology

## 2015-05-07 DIAGNOSIS — Z510 Encounter for antineoplastic radiation therapy: Secondary | ICD-10-CM | POA: Diagnosis not present

## 2015-05-07 LAB — CUP PACEART REMOTE DEVICE CHECK: Date Time Interrogation Session: 20170224130554

## 2015-05-07 NOTE — Progress Notes (Signed)
Carelink summary report received. Battery status OK. Normal device function. No new symptom episodes, tachy episodes, brady, or pause episodes. No new AF episodes. Monthly summary reports and ROV/PRN 

## 2015-05-08 ENCOUNTER — Ambulatory Visit (INDEPENDENT_AMBULATORY_CARE_PROVIDER_SITE_OTHER): Payer: Medicare HMO | Admitting: Cardiology

## 2015-05-08 ENCOUNTER — Encounter: Payer: Self-pay | Admitting: Cardiology

## 2015-05-08 VITALS — BP 130/68 | HR 80 | Ht 70.0 in | Wt 242.0 lb

## 2015-05-08 DIAGNOSIS — I6389 Other cerebral infarction: Secondary | ICD-10-CM

## 2015-05-08 DIAGNOSIS — I77810 Thoracic aortic ectasia: Secondary | ICD-10-CM

## 2015-05-08 DIAGNOSIS — I638 Other cerebral infarction: Secondary | ICD-10-CM

## 2015-05-08 DIAGNOSIS — I2510 Atherosclerotic heart disease of native coronary artery without angina pectoris: Secondary | ICD-10-CM | POA: Diagnosis not present

## 2015-05-08 NOTE — Progress Notes (Signed)
Patient ID: James Mcclain, male   DOB: 1945/12/30, 70 y.o.   MRN: [id number] PCP: Dr. Dennard Schaumann  70 yo with history of type II diabetes, HTN, CKD, paroxysmal atrial fibrillation, CVA, and CAD s/p PCI presents for cardiology followup. At initial visit in 5/14, he reported significant new exertional dyspnea.  I set him up for ETT-Sestamibi. This showed a hypertensive BP response with extreme dyspnea.  There was a fixed inferior defect with EF 56%.  Echo showed moderate LVH with EF 55-60%.  Patient continued to have the same severe dyspnea with exertion.  Given ongoing symptoms, I did a left heart cath in 6/14 which showed 90% proximal LAD stenosis.  This was treated with Xience DES.    In 8/15, he had a CVA treated with TPA.  Event monitor in 8/15 did not show atrial fibrillation.  Loop recorder was placed and so far has not shown any atrial fibrillation.  Last echo in 3/16 showed EF 60-65%.  MRA chest in 8/16 showed stable 4.4 cm ascending aortic aneurysm.   He just completed XRT for prostate cancer.  No dyspnea walking on flat ground, mild dyspnea/fatigue walking up a hill.  No chest pain.  No orthopnea/PND.  He does not feel palpitations.  No claudication.  He plays golf.  Weight has dropped by about 20 lbs during prostate cancer treatment.   Labs (3/14): K 4.5, creatinine 1.47, TSH normal, HCT 51.2, LDL 154, HDL 37 Labs (6/14): K 3.6, creatinine 1.4 Labs (7/14): LDL 54, HDL 30 Labs (3/15): K 4.5, creatinine 1.42, HCT 51.3, LDL 79, HDL 38 Labs (2/16): LDL 62, HDL 34 Labs (5/16): HCT 55.9, K 4.3, creatinine 1.18 Labs (10/16): LDL 57, HDL 27 Labs (2/17): K 4.4, creatinine 1.42, HCT 53.1  PMH: 1. HTN 2. Type II diabetes 3. Gout 4. OA s/p TKR 5. CKD: Suspect diabetic nephropathy 6. Hyperlipidemia 7. Prostate cancer: Diagnosed 5/14.  8. CAD: Echo (5/14) with EF 55-60%, moderate LVH.  ETT-Sestamibi with hypertensive BP response, severe dyspnea, inferior fixed defect with EF 56%.  Echo with moderate  LVH, EF 55-60%.  LHC (6/14) with 90% pLAD stenosis; this was treated with Xience DES.  Echo (3/16) with EF 60-65%.  9. CVA: 8/15, left frontal and temporal lobe involvement, treated with TPA.  8/15 event monitor showed no atrial fibrillation.  Now has ILR.  10. Ascending aorta aneurysm: TEE (8/15) with 4.4 cm ascending aorta. MRA chest (8/16) with 4.5 cm aortic root and 4.4 cm ascending aortic aneurysm.  11. PFO: Small PFO seen by TEE in in 8/15.   SH: Married, retired [employer] consultant, lives in [place].  Nonsmoker.  FH: Brother with CHF, father with "enlarged heart"  ROS: All systems reviewed and negative except as per HPI.   Current Outpatient Prescriptions  Medication Sig Dispense Refill  . allopurinol (ZYLOPRIM) 100 MG tablet TAKE TWO TABLETS BY MOUTH ONCE DAILY 30 tablet 5  . amLODipine (NORVASC) 10 MG tablet Take 10 mg by mouth daily.     [name] ascorbic acid (VITAMIN C) 1000 MG tablet Take 1,000 mg by mouth daily. Reported on 03/30/2015    . aspirin 81 MG tablet Take 81 mg by mouth daily. Reported on 04/05/2015    . ASSURE COMFORT LANCETS 30G MISC     . atorvastatin (LIPITOR) 40 MG tablet TAKE ONE TABLET BY MOUTH DAILY AT 6 PM 90 tablet 1  . BAYER CONTOUR TEST test strip     . carvedilol (COREG) 12.5 MG tablet Take 1  tablet (12.5 mg total) by mouth 2 (two) times daily. 60 tablet 2  . Cholecalciferol 1000 UNITS TBDP Take 1 tablet by mouth daily.     . clopidogrel (PLAVIX) 75 MG tablet TAKE ONE TABLET BY MOUTH ONCE DAILY WITH BREAKFAST. 90 tablet 2  . doxazosin (CARDURA) 4 MG tablet Take 1 tablet (4 mg total) by mouth daily. 30 tablet 5  . Insulin Detemir (LEVEMIR FLEXPEN) 100 UNIT/ML Pen Inject 50 Units into the skin every morning.    . Insulin Pen Needle (PEN NEEDLES) 31G X 6 MM MISC 1 each by Does not apply route daily. 100 each 3  . losartan-hydrochlorothiazide (HYZAAR) 100-25 MG per tablet Take 1 tablet by mouth daily.     . magnesium oxide (MAG-OX) 400 MG tablet Take 400 mg by mouth daily.     . metFORMIN (GLUCOPHAGE) 500 MG tablet Take 500-1,000 mg by mouth 2 (two) times daily with a meal. 1000mg  in the am , 500mg  in pm     No current facility-administered medications for this visit.   BP 130/68 mmHg  Pulse 80  Ht 5\' 10"  (1.778 m)  Wt 242 lb (109.77 kg)  BMI 34.72 kg/m2 General: NAD, obese Neck: No JVD, no thyromegaly or thyroid nodule.  Lungs: Clear to auscultation bilaterally with normal respiratory effort. CV: Nondisplaced PMI.  Heart regular S1/S2, +S4, 216 early SEM RUSB.  No edema.  No carotid bruit.  Normal pedal pulses.  Abdomen: Soft, nontender, no hepatosplenomegaly, no distention.  Neurologic: Alert and oriented x 3.  Psych: Normal affect. Extremities: No clubbing or cyanosis.   Assessment/Plan: 1. CAD: s/p PCI to proximal LAD.  No chest pain. He will continue ASA 81, atorvastatin, Coreg, and ARB.  I will have him continue Plavix long-term if he has no bleeding complications, especially given CVA.   2. HTN: BP controlled, continue current meds.  3. Hyperlipidemia: Good lipids in 10/16.  Continue statin.     4. CVA: Cryptogenic CVA in 8/15.  Has small PFO but doubt this explains.  Concern for paroxysmal atrial fibrillation, has numerous RFs for atrial fibrillation. He has ILR in place, so far no atrial fibrillation.   5. Ascending aortic aneurysm: 4.4 cm on MRA 8/16.  Need to keep BP controlled.  Will get MRA chest in 8/17 to follow.    Followup in 1 year    Loralie Champagne 05/08/2015

## 2015-05-08 NOTE — Patient Instructions (Addendum)
Medication Instructions:  Your physician recommends that you continue on your current medications as directed. Please refer to the Current Medication list given to you today.   Labwork: Lab work to be done 2 weeks prior to MRA.  --Late July 2017  Testing/Procedures: You will need to have an MRA of chest in early August 2017  Follow-Up: Your physician wants you to follow-up in: 12 months. You will receive a reminder letter in the mail two months in advance. If you don't receive a letter, please call our office to schedule the follow-up appointment.   Any Other Special Instructions Will Be Listed Below (If Applicable).     If you need a refill on your cardiac medications before your next appointment, please call your pharmacy.

## 2015-05-10 ENCOUNTER — Other Ambulatory Visit: Payer: Self-pay | Admitting: Family Medicine

## 2015-05-10 NOTE — Telephone Encounter (Signed)
Medication refilled per protocol. 

## 2015-05-16 NOTE — Progress Notes (Signed)
  Radiation Oncology         [phone] ________________________________  Name: James Mcclain MRN: [id number]  Date: 05/07/2015  DOB: 11-25-1945   End of Treatment Note   ICD-9-CM ICD-10-CM    1. 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  Image guidance was performed with daily cone beam CT prior to each fraction to align to gold markers in the prostate and assure proper bladder and rectal fill volumes.  Immobilization was achieved with BodyFix custom mold.  Narrative: The patient tolerated radiation treatment relatively well.   The patient experienced some minor urinary irritation and modest fatigue.    Plan: The patient has completed radiation treatment. He will return to radiation oncology clinic for routine followup in one month. I advised him to call or return sooner if he has any questions or concerns related to his recovery or treatment. ________________________________  Sheral Apley. Tammi Klippel, M.D.

## 2015-05-17 ENCOUNTER — Ambulatory Visit (INDEPENDENT_AMBULATORY_CARE_PROVIDER_SITE_OTHER): Payer: Medicare HMO | Admitting: Family Medicine

## 2015-05-17 ENCOUNTER — Encounter: Payer: Self-pay | Admitting: Family Medicine

## 2015-05-17 VITALS — BP 120/86 | HR 60 | Temp 98.1°F | Resp 16 | Ht 70.0 in | Wt 240.0 lb

## 2015-05-17 DIAGNOSIS — E119 Type 2 diabetes mellitus without complications: Secondary | ICD-10-CM

## 2015-05-17 DIAGNOSIS — I10 Essential (primary) hypertension: Secondary | ICD-10-CM | POA: Diagnosis not present

## 2015-05-17 DIAGNOSIS — Z794 Long term (current) use of insulin: Secondary | ICD-10-CM | POA: Diagnosis not present

## 2015-05-17 NOTE — Progress Notes (Signed)
Subjective:    Patient ID: James Mcclain, male    DOB: 09/28/1945, 70 y.o.   MRN: [id number]  HPI 04/23/15  patient's blood pressure has been consistently high recently. His blood pressures frequently between [id number] and 0000000 systolic over A999333. He has this checked at home as well as at the cancer center. Today I checked the blood pressure myself and found to be 130/78. Occasionally he will get 130 to 135/78-80.[name] However the majority of his blood pressures are raging between 130 and 160/80-90.   His last hemoglobin A1c was elevated in October. He admits that he is not checking his blood sugar. He denies any hypoglycemic episodes. He denies any polyuria, polydipsia, or blurred vision. Past medical history significant for coronary artery disease, CVA. Therefore his goal hemoglobin A1c will be less than 7.  AT that time, my plan was:  Blood pressure today is acceptable however the majority of his blood pressures are elevated at home. Therefore I'm going to start the patient on Cardura 4 mg by mouth daily and recheck blood pressure in one month. I will also check a hemoglobin A1c. Goal hemoglobin A1c is less than 7.0  05/17/15 Since starting the medication, the patient's blood pressure has been averaging [id number] systolic over [id number]. He denies any chest pain shortness of breath or dyspnea on exertion. His last visit I checked his hemoglobin A1c and found to be elevated at 8.3. He is currently on metformin 1000 mg in the morning and 500 mg in the evening. He also takes Levemir 50 units once daily. He has been checking his sugars recently and found that the majority of his fasting blood sugars are 150-160. He is not checking 2 hour postprandial sugars Past Medical History  Diagnosis Date  . Arthritis   . Essential hypertension   . Elevated PSA   . Coronary artery disease     a. s/p Xience DES to the LAD 07/2012.  . Prostate cancer ([place]) 07/15/12  . Insulin dependent diabetes mellitus ([place])   . Stroke [facility])     a. Cryptogenic, 09/2013, event monitor with NSR. Small PFO noted on echo but neg LE duplex.  [name] PVC's (premature ventricular contractions)   . PFO (patent foramen ovale)     a. By echo 09/2013.  . Dilated aortic root ([place])     a. Mildly dilated by echo 09/2013, f/u MRA scheduled for 12/2014.  [name] Hyperlipidemia   . 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 On tele 09/2013.  [name] Polycythemia   . Hx of gout   . History of kidney stones    Past Surgical History  Procedure Laterality Date  . Kidney stones      2006 - LITHOTRIPSY  . Tonsillectomy      as adult  . Total knee arthroplasty  01/24/2011    Procedure: TOTAL KNEE ARTHROPLASTY;  Surgeon: Alta Corning;  Location: [place];  Service: Orthopedics;  Laterality: Left;  COMPUTER ASSISTED LEFT  TOTAL KNEE REPLACEMENT. Anesthesia a combination of regional and general.  . Coronary angioplasty with stent placement  08/05/2012    LAD, 1 stent  . Prostate biopsy  07/15/2012  . Tee without cardioversion N/A 09/27/2013    Procedure: TRANSESOPHAGEAL ECHOCARDIOGRAM (TEE);  Surgeon: Larey Dresser, MD;  Location: [facility] ENDOSCOPY;  Service: Cardiovascular;  Laterality: N/A;  . Percutaneous coronary stent intervention (pci-s) N/A 08/05/2012    Procedure: PERCUTANEOUS CORONARY STENT INTERVENTION (PCI-S);  Surgeon: Sherren Mocha, MD;  Location: [facility]  CATH LAB;  Service: Cardiovascular;  Laterality: N/A;  . Ep implantable device N/A 08/23/2014    Procedure: Loop Recorder Insertion;  Surgeon: Evans Lance, MD;  Location: [place] CV LAB;  Service: Cardiovascular;  Laterality: N/A;  . Prostate biopsy  09/23/2013   Current Outpatient Prescriptions on File Prior to Visit  Medication Sig Dispense Refill  . allopurinol (ZYLOPRIM) 100 MG tablet TAKE TWO TABLETS BY MOUTH ONCE DAILY 30 tablet 5  . amLODipine (NORVASC) 10 MG tablet Take 10 mg by mouth daily.     [name] ascorbic acid (VITAMIN C) 1000 MG tablet Take 1,000 mg by mouth daily. Reported on 03/30/2015    . aspirin 81  MG tablet Take 81 mg by mouth daily. Reported on 04/05/2015    . ASSURE COMFORT LANCETS 30G MISC     . atorvastatin (LIPITOR) 40 MG tablet TAKE ONE TABLET BY MOUTH DAILY AT 6 PM 90 tablet 1  . BAYER CONTOUR TEST test strip     . carvedilol (COREG) 12.5 MG tablet Take 1 tablet (12.5 mg total) by mouth 2 (two) times daily. 60 tablet 2  . Cholecalciferol 1000 UNITS TBDP Take 1 tablet by mouth daily.     . clopidogrel (PLAVIX) 75 MG tablet TAKE ONE TABLET BY MOUTH ONCE DAILY WITH BREAKFAST. 90 tablet 2  . doxazosin (CARDURA) 4 MG tablet Take 1 tablet (4 mg total) by mouth daily. 30 tablet 5  . Insulin Detemir (LEVEMIR FLEXPEN) 100 UNIT/ML Pen Inject 50 Units into the skin every morning.    . Insulin Pen Needle (PEN NEEDLES) 31G X 6 MM MISC 1 each by Does not apply route daily. 100 each 3  . losartan-hydrochlorothiazide (HYZAAR) 100-25 MG per tablet Take 1 tablet by mouth daily.     . magnesium oxide (MAG-OX) 400 MG tablet Take 400 mg by mouth daily.    . metFORMIN (GLUCOPHAGE) 500 MG tablet TAKE TWO TABLETS BY MOUTH TWICE DAILY WITH A MEAL 120 tablet 5   No current facility-administered medications on file prior to visit.   Allergies  Allergen Reactions  . Betadine [Povidone Iodine] Other (See Comments)    Patient doesn't remember  . Contrast Media [Iodinated Diagnostic Agents] Other (See Comments)    Patient doesn't remember  . Fish Allergy Swelling   Social History   Social History  . Marital Status: Married    Spouse Name: N/A  . Number of Children: 3  . Years of Education: 12th   Occupational History  . retired    Social History Main Topics  . Smoking status: Never Smoker   . Smokeless tobacco: Never Used  . Alcohol Use: No  . Drug Use: No  . Sexual Activity: Not Currently   Other Topics Concern  . Not on file   Social History Narrative   [name]   Patient is right handed   Patient drinks coffee and sodas      Review of Systems  All  other systems reviewed and are negative.      Objective:   Physical Exam  Constitutional: He appears well-developed and well-nourished.  Cardiovascular: Normal rate, regular rhythm and normal heart sounds.   No murmur heard. Pulmonary/Chest: Effort normal and breath sounds normal. No respiratory distress. He has no wheezes. He has no rales.  Abdominal: Soft. Bowel sounds are normal.  Vitals reviewed.         Assessment & Plan:  Benign essential HTN  Diabetes mellitus, type  II, insulin dependent ([place])  Blood pressure is excellent. I will make no changes in his blood pressure medication at this time. I will have the patient switch his Levemir to 30 units twice daily and report to me fasting blood sugars in one week. I will increase titrating his Levemir until fasting blood sugars are less than 130. At that time I will determine attention to his two-hour postprandial sugars. We may need to add mealtime insulin versus jardiance.

## 2015-05-21 ENCOUNTER — Telehealth: Payer: Self-pay | Admitting: Family Medicine

## 2015-05-21 ENCOUNTER — Ambulatory Visit (INDEPENDENT_AMBULATORY_CARE_PROVIDER_SITE_OTHER): Payer: Medicare HMO | Admitting: *Deleted

## 2015-05-21 DIAGNOSIS — I6389 Other cerebral infarction: Secondary | ICD-10-CM

## 2015-05-21 DIAGNOSIS — I638 Other cerebral infarction: Secondary | ICD-10-CM

## 2015-05-21 NOTE — Progress Notes (Signed)
Carelink Summary Report / Loop Recorder 

## 2015-05-21 NOTE — Telephone Encounter (Signed)
Patient calling in with blood sugar readings:Friday  164, Saturday 128, Sunday 134, and Monday 114 these were taken before breakfast.  CB# [phone]

## 2015-05-21 NOTE — Telephone Encounter (Signed)
Increase Levemir to 35 twice daily and recheck in one week. However the majority seem pretty good

## 2015-05-22 NOTE — Telephone Encounter (Signed)
Pt aware of recommendations

## 2015-05-31 ENCOUNTER — Other Ambulatory Visit: Payer: Self-pay | Admitting: Physician Assistant

## 2015-06-01 NOTE — Telephone Encounter (Signed)
I do not think Dr Aundra Dubin prescribed this for him

## 2015-06-01 NOTE — Telephone Encounter (Signed)
Medication was prescribed by Melina Copa in March of 2016. Ok to reorder or should this be deferred to pcp?

## 2015-06-06 ENCOUNTER — Other Ambulatory Visit: Payer: Self-pay | Admitting: *Deleted

## 2015-06-06 DIAGNOSIS — D751 Secondary polycythemia: Secondary | ICD-10-CM

## 2015-06-06 DIAGNOSIS — C61 Malignant neoplasm of prostate: Secondary | ICD-10-CM

## 2015-06-12 ENCOUNTER — Ambulatory Visit
Admission: RE | Admit: 2015-06-12 | Discharge: 2015-06-12 | Disposition: A | Discharge status: Home / Self Care | Payer: Medicare HMO | Source: Ambulatory Visit | Attending: Radiation Oncology | Admitting: Radiation Oncology

## 2015-06-12 ENCOUNTER — Other Ambulatory Visit: Payer: Self-pay | Admitting: Radiation Oncology

## 2015-06-12 ENCOUNTER — Encounter: Payer: Self-pay | Admitting: Radiation Oncology

## 2015-06-12 VITALS — BP 142/78 | HR 67 | Temp 97.8°F | Ht 70.0 in | Wt 243.9 lb

## 2015-06-12 DIAGNOSIS — C61 Malignant neoplasm of prostate: Secondary | ICD-10-CM

## 2015-06-12 NOTE — Progress Notes (Signed)
Radiation Oncology         [phone] ________________________________  Name: James Mcclain MRN: [id number]  Date: 06/12/2015  DOB: 1945/03/20  Follow-Up Visit Note  CC: Odette Fraction, MD  Lowella Bandy, MD  Diagnosis:  Stage T1c adenocarcinoma prostate with a Gleason's score of 3+4 and a PSA of 8.4    ICD-9-CM ICD-10-CM   1. Prostate cancer ([facility]) 185 C61     Interval Since Last Radiation:  1  months   03/13/2015-05/07/2015: The prostate was treated to 78 Gy in 40 fractions of 1.95 Gy  Narrative:  The patient returns today for routine follow-up since completing his radiotherapy.   Of note, during treatment he did experience nocturia 2, and urinary frequency more notable in the morning.  On review of systems, he states that his urinary complaints have completely resolved. He denies any nocturia, hematuria, dysuria, intermittency or frequency. He states that he has an appointment to follow up with urology and June of this year. He asks whether or not he should continue taking magnesium oxide. He has noticed that while taking this, he does occasionally experience loose stools. He denies really being able to distinguish to get this is been since radiation or from the magnesium.                      ALLERGIES:  is allergic to betadine; contrast media; and fish allergy.  Meds: Current Outpatient Prescriptions  Medication Sig Dispense Refill  . allopurinol (ZYLOPRIM) 100 MG tablet TAKE TWO TABLETS BY MOUTH ONCE DAILY 30 tablet 5  . amLODipine (NORVASC) 10 MG tablet Take 10 mg by mouth daily.     [name] aspirin 81 MG tablet Take 81 mg by mouth daily. Reported on 04/05/2015    . ASSURE COMFORT LANCETS 30G MISC     . atorvastatin (LIPITOR) 40 MG tablet TAKE ONE TABLET BY MOUTH DAILY AT 6 PM 90 tablet 1  . BAYER CONTOUR TEST test strip     . carvedilol (COREG) 12.5 MG tablet Take 1 tablet (12.5 mg total) by mouth 2 (two) times daily. 60 tablet 2  . Cholecalciferol 1000 UNITS TBDP Take 1 tablet  by mouth daily.     . clopidogrel (PLAVIX) 75 MG tablet TAKE ONE TABLET BY MOUTH ONCE DAILY WITH BREAKFAST. 90 tablet 2  . doxazosin (CARDURA) 4 MG tablet Take 1 tablet (4 mg total) by mouth daily. 30 tablet 5  . Insulin Detemir (LEVEMIR FLEXPEN) 100 UNIT/ML Pen Inject 50 Units into the skin every morning.    . Insulin Pen Needle (PEN NEEDLES) 31G X 6 MM MISC 1 each by Does not apply route daily. 100 each 3  . losartan-hydrochlorothiazide (HYZAAR) 100-25 MG per tablet Take 1 tablet by mouth daily.     . magnesium oxide (MAG-OX) 400 MG tablet Take 400 mg by mouth daily.    . metFORMIN (GLUCOPHAGE) 500 MG tablet TAKE TWO TABLETS BY MOUTH TWICE DAILY WITH A MEAL 120 tablet 5  . ascorbic acid (VITAMIN C) 1000 MG tablet Take 1,000 mg by mouth daily. Reported on 03/30/2015     No current facility-administered medications for this encounter.    Physical Findings:  height is 5\' 10"  (1.778 m) and weight is 243 lb 14.4 oz (110.632 kg). His temperature is 97.8 F (36.6 C). His blood pressure is 142/78 and his pulse is 67.   Pain scale 0/10 In general this is a well appearing African-American male in no acute distress.  He's alert and oriented x4 and appropriate throughout the examination. Cardiopulmonary assessment is negative for acute distress and he exhibits normal effort.    Lab Findings: Lab Results  Component Value Date   WBC 5.8 04/04/2015   WBC 10.6* 03/15/2015   HGB 16.2 04/04/2015   HGB 15.8 03/15/2015   HCT 53.1* 04/04/2015   HCT 52.7* 03/15/2015   PLT 227 04/04/2015   PLT 291 03/15/2015    Lab Results  Component Value Date   NA 139 04/23/2015   NA 140 04/04/2015   K 4.4 04/23/2015   K 4.3 04/04/2015   CHLORIDE 102 04/04/2015   CO2 28 04/23/2015   CO2 28 04/04/2015   GLUCOSE 159* 04/23/2015   GLUCOSE 299* 04/04/2015   BUN 33* 04/23/2015   BUN 17.1 04/04/2015   CREATININE 1.42* 04/23/2015   CREATININE 1.2 04/04/2015   CREATININE 1.30* 03/15/2015   BILITOT 0.9 04/23/2015     BILITOT 0.91 04/04/2015   ALKPHOS 67 04/23/2015   ALKPHOS 73 04/04/2015   AST 12 04/23/2015   AST 17 04/04/2015   ALT 12 04/23/2015   ALT 18 04/04/2015   PROT 7.0 04/23/2015   PROT 7.3 04/04/2015   ALBUMIN 3.9 04/23/2015   ALBUMIN 3.3* 04/04/2015   CALCIUM 9.9 04/23/2015   CALCIUM 9.3 04/04/2015   ANIONGAP 11 04/04/2015   ANIONGAP 9 03/15/2015    Radiographic Findings: No results found.  Impression/Plan: 1. Stage T1c adenocarcinoma prostate with a Gleason's score of 3+4 and a PSA of 8.4.The patient appears to be doing well and has noticed resolution of the symptoms he experienced during radiation. He has follow-up already dictated June with [facility], and we discussed that we would be happy to see him in the future if he has any questions or concerns regarding his previous radiation, or if there was a need for [place] to reevaluate him in the future. He states agreement and understanding is encouraged to keep [place] informed any concerns he has.  2. Survivorship. The patient is interested in meeting with Mike Craze, NP for survivorship assessment. I will refer him to meet with Elzie Rings, and we'll plan to have him see her in about one months time. 3. PVCs. I looked through the patient's chart to determine the source of where his magnesium administration originated. It appears that cardiology started him on this due to PVCs that were seen on EKG, his magnesium at that time was between 1.7 and 2. I have encouraged patient to reach out to cardiology P hasn't heard anything back by the end of the week as I have tried to reach out to the provider who originally prescribed this.     Carola Rhine, PAC

## 2015-06-12 NOTE — Progress Notes (Signed)
Mr James Mcclain reports intermittent, urinary urgency, however, he empties completely, no straining to start stream.  Dribbling at end of stream "sometimes". Nocturia x 1.   Denies any rectal irritation with intermittent loose stools

## 2015-06-13 ENCOUNTER — Telehealth: Payer: Self-pay | Admitting: *Deleted

## 2015-06-13 NOTE — Telephone Encounter (Signed)
CALLED PATIENT TO INFORM OF APPT. WITH GRETCHEN DAWSON ON 07-12-15 @ 11 AM, SPOKE WITH PATIENT AND HE IS AWARE OF THIS APPT.

## 2015-06-19 ENCOUNTER — Ambulatory Visit (INDEPENDENT_AMBULATORY_CARE_PROVIDER_SITE_OTHER): Payer: Medicare HMO | Admitting: *Deleted

## 2015-06-19 DIAGNOSIS — I638 Other cerebral infarction: Secondary | ICD-10-CM

## 2015-06-19 DIAGNOSIS — I6389 Other cerebral infarction: Secondary | ICD-10-CM

## 2015-06-19 NOTE — Progress Notes (Signed)
Carelink Summary Report / Loop Recorder 

## 2015-06-21 ENCOUNTER — Telehealth: Payer: Self-pay | Admitting: Radiation Oncology

## 2015-06-21 NOTE — Telephone Encounter (Signed)
-----   Message from Hayden Pedro, [place] sent at 06/21/2015  1:17 PM EDT ----- Would you mind letting Mr. Lauren know that cardiology thinks he should continue this? Thanks, Bryson Ha  ----- Message -----    From: Eileen Stanford, PA-C    Sent: 06/21/2015  12:57 PM      To: Hayden Pedro, PA-C  I do see it now. That's weird it didn't pull up when i sorted by provider in the notes. Anyway, i dont think it matters really. Its fine to continue on it.    ----- Message -----    From: Hayden Pedro, PA-C    Sent: 06/21/2015   9:17 AM      To: Eileen Stanford, PA-C  Jacklynn Ganong,  I had to look through this as well and dig deep to find it, there are notes that she has back from March that indicated that when his Mg was 1.7 she wanted him to remain on the mag ox.  Thanks, Bryson Ha  ----- Message -----    From: Eileen Stanford, PA-C    Sent: 06/21/2015   8:39 AM      To: Hayden Pedro, PA-C  I am covering Dayna's box while on maternity leave. I dont see where he saw Dayna in looking through epic. I dont see any mention of PVCs or mag ox in his last visit with Dr. Aundra Dubin (although it was listed in his med list). Sorry i cant help.   ----- Message -----    From: Hayden Pedro, PA-C    Sent: 06/12/2015  11:44 AM      To: Charlie Pitter, PA-C  This pt was seen by Lisbeth Renshaw and started on Max Ox 400mg  for PVCs. He's questioning if you guys still want him to take this, could you reach out to him with recommendations? Thanks, Shona Simpson, PAC Rad Onc

## 2015-06-21 NOTE — Telephone Encounter (Signed)
Per Alison's request phoned patient explaining his cardiologist does want him to resume Mag Ox. Patient verbalized understanding. Patient denies any additional needs.

## 2015-07-06 ENCOUNTER — Other Ambulatory Visit: Payer: Self-pay | Admitting: Cardiology

## 2015-07-09 ENCOUNTER — Other Ambulatory Visit: Payer: Self-pay | Admitting: *Deleted

## 2015-07-09 ENCOUNTER — Telehealth: Payer: Self-pay | Admitting: Adult Health

## 2015-07-09 DIAGNOSIS — D751 Secondary polycythemia: Secondary | ICD-10-CM

## 2015-07-09 DIAGNOSIS — C61 Malignant neoplasm of prostate: Secondary | ICD-10-CM

## 2015-07-09 MED ORDER — MAGNESIUM OXIDE 400 MG PO TABS: 400.0000 mg | ORAL_TABLET | Freq: Every day | ORAL | Status: DC

## 2015-07-09 NOTE — Telephone Encounter (Signed)
I spoke with James Mcclain to let him know that I needed to cancel his survivorship appt scheduled for Thursday, 07/12/15 due to a family emergency and I would be out of the office.  I offered to reschedule this appt and/or just mail the resources to his home since the appt needs to be cancelled at the last minute.  He voiced that he would prefer to have the resources mailed to him, which I will do.  I encouraged him to call me with any questions or concerns once he receives the survivorship resources in the mail.  He agreed with this plan.   Mike Craze, NP [place] [phone]

## 2015-07-12 ENCOUNTER — Encounter: Payer: Medicare HMO | Admitting: Adult Health

## 2015-07-12 ENCOUNTER — Other Ambulatory Visit: Payer: Self-pay | Admitting: Family Medicine

## 2015-07-12 NOTE — Telephone Encounter (Signed)
Medication refilled per protocol. 

## 2015-07-13 ENCOUNTER — Emergency Department (HOSPITAL_COMMUNITY)
Admission: EM | Admit: 2015-07-13 | Discharge: 2015-07-13 | Disposition: A | Discharge status: Home / Self Care | Payer: Medicare HMO | Attending: Emergency Medicine | Admitting: Emergency Medicine

## 2015-07-13 ENCOUNTER — Emergency Department (HOSPITAL_COMMUNITY): Payer: Medicare HMO

## 2015-07-13 DIAGNOSIS — R51 Headache: Secondary | ICD-10-CM | POA: Diagnosis present

## 2015-07-13 DIAGNOSIS — Z8546 Personal history of malignant neoplasm of prostate: Secondary | ICD-10-CM | POA: Diagnosis not present

## 2015-07-13 DIAGNOSIS — R4182 Altered mental status, unspecified: Secondary | ICD-10-CM | POA: Diagnosis not present

## 2015-07-13 DIAGNOSIS — E119 Type 2 diabetes mellitus without complications: Secondary | ICD-10-CM | POA: Insufficient documentation

## 2015-07-13 DIAGNOSIS — Z7984 Long term (current) use of oral hypoglycemic drugs: Secondary | ICD-10-CM | POA: Insufficient documentation

## 2015-07-13 DIAGNOSIS — Z79899 Other long term (current) drug therapy: Secondary | ICD-10-CM | POA: Diagnosis not present

## 2015-07-13 DIAGNOSIS — Z7982 Long term (current) use of aspirin: Secondary | ICD-10-CM | POA: Diagnosis not present

## 2015-07-13 DIAGNOSIS — M1990 Unspecified osteoarthritis, unspecified site: Secondary | ICD-10-CM | POA: Diagnosis not present

## 2015-07-13 DIAGNOSIS — Z96652 Presence of left artificial knee joint: Secondary | ICD-10-CM | POA: Diagnosis not present

## 2015-07-13 DIAGNOSIS — Z8673 Personal history of transient ischemic attack (TIA), and cerebral infarction without residual deficits: Secondary | ICD-10-CM | POA: Diagnosis not present

## 2015-07-13 DIAGNOSIS — Z7901 Long term (current) use of anticoagulants: Secondary | ICD-10-CM | POA: Insufficient documentation

## 2015-07-13 DIAGNOSIS — Z794 Long term (current) use of insulin: Secondary | ICD-10-CM | POA: Insufficient documentation

## 2015-07-13 DIAGNOSIS — I10 Essential (primary) hypertension: Secondary | ICD-10-CM | POA: Diagnosis not present

## 2015-07-13 DIAGNOSIS — R519 Headache, unspecified: Secondary | ICD-10-CM

## 2015-07-13 DIAGNOSIS — I2510 Atherosclerotic heart disease of native coronary artery without angina pectoris: Secondary | ICD-10-CM | POA: Diagnosis not present

## 2015-07-13 DIAGNOSIS — E785 Hyperlipidemia, unspecified: Secondary | ICD-10-CM | POA: Insufficient documentation

## 2015-07-13 LAB — URINE MICROSCOPIC-ADD ON: Squamous Epithelial / LPF: NONE SEEN

## 2015-07-13 LAB — URINALYSIS, ROUTINE W REFLEX MICROSCOPIC
Bilirubin Urine: NEGATIVE
Glucose, UA: NEGATIVE mg/dL
Ketones, ur: NEGATIVE mg/dL
Leukocytes, UA: NEGATIVE
Nitrite: NEGATIVE
Protein, ur: 30 mg/dL — AB
Specific Gravity, Urine: 1.019 (ref 1.005–1.030)
pH: 5 (ref 5.0–8.0)

## 2015-07-13 LAB — CBC WITH DIFFERENTIAL/PLATELET
Basophils Absolute: 0 10*3/uL (ref 0.0–0.1)
Basophils Relative: 0 %
Eosinophils Absolute: 0.3 10*3/uL (ref 0.0–0.7)
Eosinophils Relative: 5 %
HCT: 51.6 % (ref 39.0–52.0)
Hemoglobin: 17.1 g/dL — ABNORMAL HIGH (ref 13.0–17.0)
Lymphocytes Relative: 12 %
Lymphs Abs: 0.8 10*3/uL (ref 0.7–4.0)
MCH: 31.7 pg (ref 26.0–34.0)
MCHC: 33.1 g/dL (ref 30.0–36.0)
MCV: 95.7 fL (ref 78.0–100.0)
Monocytes Absolute: 0.5 10*3/uL (ref 0.1–1.0)
Monocytes Relative: 7 %
Neutro Abs: 5.1 10*3/uL (ref 1.7–7.7)
Neutrophils Relative %: 76 %
Platelets: 183 10*3/uL (ref 150–400)
RBC: 5.39 MIL/uL (ref 4.22–5.81)
RDW: 15 % (ref 11.5–15.5)
WBC: 6.8 10*3/uL (ref 4.0–10.5)

## 2015-07-13 LAB — BASIC METABOLIC PANEL
Anion gap: 10 (ref 5–15)
BUN: 22 mg/dL — ABNORMAL HIGH (ref 6–20)
CO2: 26 mmol/L (ref 22–32)
Calcium: 9.1 mg/dL (ref 8.9–10.3)
Chloride: 104 mmol/L (ref 101–111)
Creatinine, Ser: 1.21 mg/dL (ref 0.61–1.24)
GFR calc Af Amer: >60 mL/min (ref >60)
GFR calc non Af Amer: 60 mL/min — ABNORMAL LOW (ref >60)
Glucose, Bld: 160 mg/dL — ABNORMAL HIGH (ref 65–99)
Potassium: 4.1 mmol/L (ref 3.5–5.1)
Sodium: 140 mmol/L (ref 135–145)

## 2015-07-13 LAB — CBG MONITORING, ED: Glucose-Capillary: 153 mg/dL — ABNORMAL HIGH (ref 65–99)

## 2015-07-13 NOTE — Discharge Instructions (Signed)
Follow-up with your primary care physician.   Return to ER with any acute changes.

## 2015-07-13 NOTE — ED Notes (Signed)
Bed: WA07 Expected date:  Expected time:  Means of arrival:  Comments: 

## 2015-07-13 NOTE — ED Notes (Signed)
Patient transported to MRI 

## 2015-07-13 NOTE — ED Notes (Signed)
Per pt, "feels funny".  Pt states he woke up feeling different this morning.  States he might have been confused.  Is a diabetic and taking meds.  cbg at home was 108.  Pt has had no n/v/d or other physical symptom.  Alert and oriented.  No physical stroke symptoms noted.  Does have headache

## 2015-07-13 NOTE — ED Notes (Signed)
Patient transported to CT 

## 2015-07-13 NOTE — ED Provider Notes (Signed)
CSN: [id number]     Arrival date & time 07/13/15  1102 History   First MD Initiated Contact with Patient 07/13/15 1111     Chief Complaint  Patient presents with  . Headache  . Altered Mental Status      HPI  Patient presents for evaluation of "feeling funny".   Patient's history of prior stroke that manifests itself as difficulty with speech. Also has history of diabetes. He had a normal day yesterday and felt well upon going to bed. He awakened at 7 AM this morning which is typical for him. He was getting up with his grandson. He states that his head felt "funny". He describes it as a full feeling on his head right greater than left. He has not had difficulty with speech or swallowing no difficulty with use of his arms or with walking. Has not felt confused has not been painful or feverish no other symptoms. It is persisted and he presents here.  He awakened again at 0700 last time known well was approximately 2300 last night.  Blood sugars morning at 7 AM was 105 which she states is good and normal for him.  Past Medical History  Diagnosis Date  . Arthritis   . Essential hypertension   . Elevated PSA   . Coronary artery disease     a. s/p Xience DES to the LAD 07/2012.  . Prostate cancer ([place]) 07/15/12  . Insulin dependent diabetes mellitus ([place])   . Stroke [facility])     a. Cryptogenic, 09/2013, event monitor with NSR. Small PFO noted on echo but neg LE duplex.  [name] PVC's (premature ventricular contractions)   . PFO (patent foramen ovale)     a. By echo 09/2013.  . Dilated aortic root ([place])     a. Mildly dilated by echo 09/2013, f/u MRA scheduled for 12/2014.  [name] Hyperlipidemia   . 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 On tele 09/2013.  [name] Polycythemia   . Hx of gout   . History of kidney stones    Past Surgical History  Procedure Laterality Date  . Kidney stones      2006 - LITHOTRIPSY  . Tonsillectomy      as adult  . Total knee arthroplasty  01/24/2011    Procedure: TOTAL  KNEE ARTHROPLASTY;  Surgeon: Alta Corning;  Location: [place];  Service: Orthopedics;  Laterality: Left;  COMPUTER ASSISTED LEFT  TOTAL KNEE REPLACEMENT. Anesthesia a combination of regional and general.  . Coronary angioplasty with stent placement  08/05/2012    LAD, 1 stent  . Prostate biopsy  07/15/2012  . Tee without cardioversion N/A 09/27/2013    Procedure: TRANSESOPHAGEAL ECHOCARDIOGRAM (TEE);  Surgeon: Larey Dresser, MD;  Location: [facility] ENDOSCOPY;  Service: Cardiovascular;  Laterality: N/A;  . Percutaneous coronary stent intervention (pci-s) N/A 08/05/2012    Procedure: PERCUTANEOUS CORONARY STENT INTERVENTION (PCI-S);  Surgeon: Sherren Mocha, MD;  Location: [facility] CATH LAB;  Service: Cardiovascular;  Laterality: N/A;  . Ep implantable device N/A 08/23/2014    Procedure: Loop Recorder Insertion;  Surgeon: Evans Lance, MD;  Location: [place] CV LAB;  Service: Cardiovascular;  Laterality: N/A;  . Prostate biopsy  09/23/2013   Family History  Problem Relation Age of Onset  . Leukemia Mother   . Heart Problems Father   . Heart Problems      family history  . Diabetes Sister     ? father  . Heart Problems Brother   .  Heart attack Neg Hx   . Stroke    . Stroke Brother   . Hypertension Brother   . Cancer Mother   . Cervical cancer Sister     ????   Social History  Substance Use Topics  . Smoking status: Never Smoker   . Smokeless tobacco: Never Used  . Alcohol Use: No    Review of Systems  Constitutional: Negative for fever, chills, diaphoresis, appetite change and fatigue.  HENT: Negative for mouth sores, sore throat and trouble swallowing.        Describes a "funny swollen feeling" in the right side of his head.  Eyes: Negative for visual disturbance.  Respiratory: Negative for cough, chest tightness, shortness of breath and wheezing.   Cardiovascular: Negative for chest pain.  Gastrointestinal: Negative for nausea, vomiting, abdominal pain, diarrhea and abdominal  distention.  Endocrine: Negative for polydipsia, polyphagia and polyuria.  Genitourinary: Negative for dysuria, frequency and hematuria.  Musculoskeletal: Negative for gait problem.  Skin: Negative for color change, pallor and rash.  Neurological: Negative for dizziness, syncope, light-headedness and headaches.  Hematological: Does not bruise/bleed easily.  Psychiatric/Behavioral: Negative for behavioral problems and confusion.      Allergies  Betadine; Contrast media; and Fish allergy  Home Medications   Prior to Admission medications   Medication Sig Start Date End Date Taking? Authorizing Provider  allopurinol (ZYLOPRIM) 100 MG tablet TAKE TWO TABLETS BY MOUTH ONCE DAILY Patient taking differently: TAKE ONE TABLETS BY MOUTH ONCE DAILY 07/12/15  Yes Susy Frizzle, MD  amLODipine (NORVASC) 10 MG tablet Take 10 mg by mouth daily.  08/27/14  Yes Historical Provider, MD  aspirin 81 MG tablet Take 81 mg by mouth daily. Reported on 04/05/2015   Yes Historical Provider, MD  atorvastatin (LIPITOR) 40 MG tablet TAKE ONE TABLET BY MOUTH DAILY AT 6 PM Patient taking differently: TAKE ONE TABLET BY MOUTH DAILY EVERY MORNING 04/30/15  Yes Susy Frizzle, MD  carvedilol (COREG) 12.5 MG tablet TAKE ONE TABLET BY MOUTH TWICE DAILY Patient taking differently: TAKE ONE TABLET BY MOUTH ONCE DAILY 07/06/15  Yes Larey Dresser, MD  Cholecalciferol 1000 UNITS TBDP Take 1 tablet by mouth daily.    Yes Historical Provider, MD  clopidogrel (PLAVIX) 75 MG tablet TAKE ONE TABLET BY MOUTH ONCE DAILY WITH BREAKFAST. 01/29/15  Yes Evans Lance, MD  doxazosin (CARDURA) 4 MG tablet Take 1 tablet (4 mg total) by mouth daily. 04/23/15  Yes Susy Frizzle, MD  Insulin Detemir (LEVEMIR FLEXPEN) 100 UNIT/ML Pen Inject 45 Units into the skin daily with supper.    Yes Historical Provider, MD  losartan-hydrochlorothiazide (HYZAAR) 100-25 MG per tablet Take 1 tablet by mouth daily.  08/20/14  Yes Historical Provider, MD   magnesium oxide (MAG-OX) 400 MG tablet Take 1 tablet (400 mg total) by mouth daily. 07/09/15  Yes Dayna N Dunn, PA-C  metFORMIN (GLUCOPHAGE) 500 MG tablet TAKE TWO TABLETS BY MOUTH TWICE DAILY WITH A MEAL Patient taking differently: TAKE TWO TABLETS BY MOUTH EVERY MORNING AND ONE TABLET BY MOUTH EVERY EVENING 05/10/15  Yes Susy Frizzle, MD  ASSURE COMFORT LANCETS 30G [place]  11/30/14   Historical Provider, MD  BAYER CONTOUR TEST test strip  11/30/14   Historical Provider, MD  Insulin Pen Needle (PEN NEEDLES) 31G X 6 MM MISC 1 each by Does not apply route daily. 09/11/14   Susy Frizzle, MD   BP 156/86 mmHg  Pulse 82  Temp(Src) 98.3 F (36.8  C) (Oral)  Resp 16  SpO2 99% Physical Exam  Constitutional: He is oriented to person, place, and time. He appears well-developed and well-nourished. No distress.  HENT:  Head: Normocephalic.  Eyes: Conjunctivae are normal. Pupils are equal, round, and reactive to light. No scleral icterus.  Neck: Normal range of motion. Neck supple. No thyromegaly present.  Cardiovascular: Normal rate and regular rhythm.  Exam reveals no gallop and no friction rub.   No murmur heard. Pulmonary/Chest: Effort normal and breath sounds normal. No respiratory distress. He has no wheezes. He has no rales.  Abdominal: Soft. Bowel sounds are normal. He exhibits no distension. There is no tenderness. There is no rebound.  Musculoskeletal: Normal range of motion.  Neurological: He is alert and oriented to person, place, and time. No cranial nerve deficit or sensory deficit. He exhibits abnormal muscle tone. He displays a negative Romberg sign.  Normal symmetric Strength to shoulder shrug, triceps, biceps, grip,wrist flex/extend,and intrinsics  Norma lsymmetric sensation above and below clavicles, and to all distributions to UEs. Norma symmetric strength to flex/.extend hip and knees, dorsi/plantar flex ankles. Normal symmetric sensation to all distributions to LEs Patellar  and achilles reflexes 1-2+. Downgoing Babinski   Skin: Skin is warm and dry. No rash noted.  Psychiatric: He has a normal mood and affect. His behavior is normal.    ED Course  Procedures (including critical care time) Labs Review Labs Reviewed  URINALYSIS, [place] (NOT AT [facility]) - Abnormal; Notable for the following:    Hgb urine dipstick TRACE (*)    Protein, ur 30 (*)    All other components within normal limits  CBC WITH DIFFERENTIAL/PLATELET - Abnormal; Notable for the following:    Hemoglobin 17.1 (*)    All other components within normal limits  BASIC METABOLIC PANEL - Abnormal; Notable for the following:    Glucose, Bld 160 (*)    BUN 22 (*)    GFR calc non Af Amer 60 (*)    All other components within normal limits  URINE MICROSCOPIC-ADD ON - Abnormal; Notable for the following:    Bacteria, UA RARE (*)    Casts HYALINE CASTS (*)    All other components within normal limits  CBG MONITORING, ED - Abnormal; Notable for the following:    Glucose-Capillary 153 (*)    All other components within normal limits    Imaging Review Ct Head Wo Contrast  07/13/2015  CLINICAL DATA:  Confusion/altered mental status.  Headache EXAM: CT HEAD WITHOUT CONTRAST TECHNIQUE: Contiguous axial images were obtained from the base of the skull through the vertex without intravenous contrast. COMPARISON:  Head CT September 23, 2013; brain MRI September 24, 2013 FINDINGS: There is age related volume loss. There is no intracranial mass, hemorrhage, extra-axial fluid collection, or midline shift. There is evidence of a prior infarct involving the anterior to mid left parietal lobe regions, stable. There is mild small vessel disease in the centra semiovale bilaterally. No new gray-white compartment lesions are identified. No acute infarct evident. Bony calvarium appears intact. Mastoids are hypoplastic bilaterally. Hypoplastic mastoids appear opacified on the left and partially opacified on the  right. No intraorbital lesions are evident. IMPRESSION: Prior left parietal lobe infarct. Age related volume loss with mild periventricular small vessel disease. No acute infarct evident. No hemorrhage or mass effect. Hypoplastic mastoids bilaterally with opacification of mastoids on the left and partial opacification on the right, stable. Electronically Signed   By: Lowella Grip III  M.D.   On: 07/13/2015 12:01   Mr Brain Wo Contrast  07/13/2015  CLINICAL DATA:  69 year old male with acute onset confusion and abnormal sensation today. Headache. Initial encounter. EXAM: MRI HEAD WITHOUT CONTRAST TECHNIQUE: Multiplanar, multiecho pulse sequences of the brain and surrounding structures were obtained without intravenous contrast. COMPARISON:  Head CT without contrast 1149 hours today. Brain MRI 09/24/2013. FINDINGS: Major intracranial vascular flow voids are stable since 2015. Expected evolution of the posterior left hemisphere cortically based infarct which was acute at that time, now with encephalomalacia and hemosiderin. No restricted diffusion to suggest acute infarction. No midline shift, mass effect, evidence of mass lesion, ventriculomegaly, extra-axial collection or acute intracranial hemorrhage. Cervicomedullary junction and pituitary are within normal limits. Stable gray and white matter signal outside of the left MCA territory since 2015. Nonspecific moderate for age cerebral white matter signal changes. No other chronic cerebral blood products. There is evidence of a small chronic lacunar infarct of the right medial thalamus which is unchanged. Deep gray matter nuclei, brainstem, and cerebellum otherwise within normal limits. Chronic mastoid effusions. Negative visualized nasopharynx aside from small volume retained secretions. Trace paranasal sinus mucosal thickening. Negative orbit and scalp soft tissues. Stable visualized osseous structures. IMPRESSION: 1.  No acute intracranial abnormality. 2.  Expected evolution of the 2015 left MCA infarct. Elsewhere stable MRI appearance of the brain since that time. Electronically Signed   By: Genevie Ann M.D.   On: 07/13/2015 14:08   I have personally reviewed and evaluated these images and lab results as part of my medical decision-making.   EKG Interpretation None      MDM   Final diagnoses:  Nonintractable headache, unspecified chronicity pattern, unspecified headache type    Normal CT and MRI with exception of maturation of previous known stroke. No new findings. On recheck patient states his symptoms have resolved. His been without intervention here. He is appropriate for discharge home.    Tanna Furry, MD 07/13/15 [phone]

## 2015-07-13 NOTE — ED Notes (Signed)
PT DISCHARGED. INSTRUCTIONS GIVEN. AAOX3. PT IN NO APPARENT DISTRESS OR PAIN. THE OPPORTUNITY TO ASK QUESTIONS WAS PROVIDED. 

## 2015-07-19 ENCOUNTER — Ambulatory Visit (INDEPENDENT_AMBULATORY_CARE_PROVIDER_SITE_OTHER): Payer: Medicare HMO | Admitting: *Deleted

## 2015-07-19 DIAGNOSIS — I638 Other cerebral infarction: Secondary | ICD-10-CM

## 2015-07-19 DIAGNOSIS — I6389 Other cerebral infarction: Secondary | ICD-10-CM

## 2015-07-19 NOTE — Progress Notes (Signed)
Carelink Summary Report / Loop Recorder 

## 2015-07-21 LAB — CUP PACEART REMOTE DEVICE CHECK: Date Time Interrogation Session: 20170326153215

## 2015-07-21 NOTE — Progress Notes (Signed)
Carelink summary report received. Battery status OK. Normal device function. No new symptom episodes, tachy episodes, brady, or pause episodes. No new AF episodes. Monthly summary reports and ROV/PRN 

## 2015-07-23 LAB — CUP PACEART REMOTE DEVICE CHECK: Date Time Interrogation Session: 20170425140554

## 2015-07-23 NOTE — Progress Notes (Signed)
Carelink summary report received. Battery status OK. Normal device function. No new symptom episodes, tachy episodes, brady, or pause episodes. No new AF episodes. Monthly summary reports and ROV/PRN 

## 2015-07-30 ENCOUNTER — Telehealth: Payer: Self-pay | Admitting: *Deleted

## 2015-07-30 ENCOUNTER — Encounter: Payer: Self-pay | Admitting: *Deleted

## 2015-07-30 ENCOUNTER — Encounter: Payer: Self-pay | Admitting: Internal Medicine

## 2015-07-30 ENCOUNTER — Telehealth: Payer: Self-pay | Admitting: Cardiology

## 2015-07-30 DIAGNOSIS — I480 Paroxysmal atrial fibrillation: Secondary | ICD-10-CM | POA: Insufficient documentation

## 2015-07-30 MED ORDER — APIXABAN 5 MG PO TABS: 5.0000 mg | ORAL_TABLET | Freq: Two times a day (BID) | ORAL | Status: DC

## 2015-07-30 NOTE — Telephone Encounter (Signed)
Called patient regarding new PAF on LINQ.  Briefly discussed AF and cryptogenic stroke.  Per Dr. Lovena Le, instructed patient to STOP Plavix (today was last dose) and START Eliquis 5mg  BID.  Patient instructed to CONTINUE aspirin 81mg  per Dr. Tanna Furry recommendation.  Prescription for Eliquis sent electronically to patient's preferred pharmacy.  Patient is agreeable to these medication changes.  He is also agreeable to AF Clinic appointment on 08/08/15 at 8:30am for follow-up (per Chanetta Marshall, NP), and is aware that we will cancel his appointment with Dr. Lovena Le scheduled for 08/20/15.  Gave AF clinic phone number for questions/concerns related to this appointment.  Samples of Eliquis placed at the front desk for patient to pick up.  He states he will pick them up tomorrow, 07/31/15, if he cannot get to our office before 5pm today.  Patient verbalizes understanding of all instructions and is appreciative of assistance.  Routed to Dr. Lovena Le and Dr. Aundra Dubin for [facility].

## 2015-07-30 NOTE — Telephone Encounter (Signed)
Called patient and received verbal permission to disclose information related to patient's ILR to patient's sister.  Called patient's sister back to update her.  Advised patient's sister of Dr. Tanna Furry orders to CONTINUE aspirin 81mg , STOP Plavix, and START Eliquis 5mg  BID.  Also gave her date/time/location of patient's AF clinic appointment.  Patient verbalizes understanding and appreciation.  She denies questions or concerns at this time.

## 2015-07-30 NOTE — Telephone Encounter (Signed)
Pt came into the office and had more questions about the alert on his LR. Please call him on his sister phone at [phone].

## 2015-07-31 ENCOUNTER — Telehealth: Payer: Self-pay | Admitting: Cardiology

## 2015-07-31 NOTE — Telephone Encounter (Signed)
It looks like Dr Lovena Le changed him from Plavix to Eliquis yesterday, see phone note 07/30/15.

## 2015-07-31 NOTE — Telephone Encounter (Signed)
Returned call, and left message that the HA may be coming from a number of things and not necessarily the medication change.  If they persist will need to follow up with PCP.

## 2015-07-31 NOTE — Telephone Encounter (Signed)
New Message:    Pt started taking Eliquis this morning,now he has a real bad headache.

## 2015-08-07 ENCOUNTER — Ambulatory Visit: Payer: Self-pay | Admitting: Radiation Oncology

## 2015-08-07 ENCOUNTER — Inpatient Hospital Stay: Admission: RE | Admit: 2015-08-07 | Payer: Self-pay | Source: Ambulatory Visit | Admitting: Radiation Oncology

## 2015-08-08 ENCOUNTER — Encounter (HOSPITAL_COMMUNITY): Payer: Self-pay | Admitting: Nurse Practitioner

## 2015-08-08 ENCOUNTER — Ambulatory Visit (HOSPITAL_COMMUNITY)
Admission: RE | Admit: 2015-08-08 | Discharge: 2015-08-08 | Disposition: A | Discharge status: Home / Self Care | Payer: Medicare HMO | Source: Ambulatory Visit | Attending: Nurse Practitioner | Admitting: Nurse Practitioner

## 2015-08-08 VITALS — BP 142/88 | HR 78 | Ht 70.0 in | Wt 235.0 lb

## 2015-08-08 DIAGNOSIS — R9431 Abnormal electrocardiogram [ECG] [EKG]: Secondary | ICD-10-CM | POA: Diagnosis not present

## 2015-08-08 DIAGNOSIS — I4891 Unspecified atrial fibrillation: Secondary | ICD-10-CM | POA: Diagnosis present

## 2015-08-08 DIAGNOSIS — I480 Paroxysmal atrial fibrillation: Secondary | ICD-10-CM | POA: Diagnosis not present

## 2015-08-08 MED ORDER — APIXABAN 5 MG PO TABS: 5.0000 mg | ORAL_TABLET | Freq: Two times a day (BID) | ORAL | Status: DC

## 2015-08-09 ENCOUNTER — Encounter (HOSPITAL_COMMUNITY): Payer: Self-pay | Admitting: Nurse Practitioner

## 2015-08-09 NOTE — Progress Notes (Signed)
Patient ID: James Mcclain, male   DOB: May 12, 1945, 70 y.o.   MRN: [id number]     Primary Care Physician: Odette Fraction, MD Referring Physician: Dr. Gentry Roch is a 70 y.o. male with a h/o cryptogenic stroke 2015 that has had a Linq device since that time. It recently showed afib in the early morning hours,pt was unaware and he was started  on apixaban and asked to f/u in the afib clinic. He has had no issues with the anticoagulant. No awareness of any heart irregularities. Already on carvedilol and will stay on asa with h/o cad/stent.Chadsvasc score is at least 6.  Today, he denies symptoms of palpitations, chest pain, shortness of breath, orthopnea, PND, lower extremity edema, dizziness, presyncope, syncope, or neurologic sequela. The patient is tolerating medications without difficulties and is otherwise without complaint today.   Past Medical History  Diagnosis Date  . Arthritis   . Essential hypertension   . Elevated PSA   . Coronary artery disease     a. s/p Xience DES to the LAD 07/2012.  . Prostate cancer ([place]) 07/15/12    Gleason 7, external beam radiation Tx, Dr. Pilar Jarvis  . Insulin dependent diabetes mellitus ([place])   . Stroke [facility])     a. Cryptogenic, 09/2013, event monitor with NSR. Small PFO noted on echo but neg LE duplex.  [name] PVC's (premature ventricular contractions)   . PFO (patent foramen ovale)     a. By echo 09/2013.  . Dilated aortic root ([place])     a. Mildly dilated by echo 09/2013, f/u MRA scheduled for 12/2014.  [name] Hyperlipidemia   . 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 On tele 09/2013.  [name] Polycythemia   . Hx of gout   . History of kidney stones    Past Surgical History  Procedure Laterality Date  . Kidney stones      2006 - LITHOTRIPSY  . Tonsillectomy      as adult  . Total knee arthroplasty  01/24/2011    Procedure: TOTAL KNEE ARTHROPLASTY;  Surgeon: Alta Corning;  Location: [place];  Service: Orthopedics;  Laterality: Left;  COMPUTER  ASSISTED LEFT  TOTAL KNEE REPLACEMENT. Anesthesia a combination of regional and general.  . Coronary angioplasty with stent placement  08/05/2012    LAD, 1 stent  . Prostate biopsy  07/15/2012  . Tee without cardioversion N/A 09/27/2013    Procedure: TRANSESOPHAGEAL ECHOCARDIOGRAM (TEE);  Surgeon: Larey Dresser, MD;  Location: [facility] ENDOSCOPY;  Service: Cardiovascular;  Laterality: N/A;  . Percutaneous coronary stent intervention (pci-s) N/A 08/05/2012    Procedure: PERCUTANEOUS CORONARY STENT INTERVENTION (PCI-S);  Surgeon: Sherren Mocha, MD;  Location: [facility] CATH LAB;  Service: Cardiovascular;  Laterality: N/A;  . Ep implantable device N/A 08/23/2014    Procedure: Loop Recorder Insertion;  Surgeon: Evans Lance, MD;  Location: [place] CV LAB;  Service: Cardiovascular;  Laterality: N/A;  . Prostate biopsy  09/23/2013    Current Outpatient Prescriptions  Medication Sig Dispense Refill  . allopurinol (ZYLOPRIM) 100 MG tablet TAKE TWO TABLETS BY MOUTH ONCE DAILY (Patient taking differently: TAKE ONE TABLETS BY MOUTH ONCE DAILY) 60 tablet 5  . amLODipine (NORVASC) 10 MG tablet Take 10 mg by mouth daily.     [name] apixaban (ELIQUIS) 5 MG TABS tablet Take 1 tablet (5 mg total) by mouth 2 (two) times daily. 60 tablet 11  . aspirin 81 MG tablet Take 81 mg by mouth daily.  Reported on 04/05/2015    . ASSURE COMFORT LANCETS 30G MISC     . atorvastatin (LIPITOR) 40 MG tablet TAKE ONE TABLET BY MOUTH DAILY AT 6 PM (Patient taking differently: TAKE ONE TABLET BY MOUTH DAILY EVERY MORNING) 90 tablet 1  . BAYER CONTOUR TEST test strip     . carvedilol (COREG) 12.5 MG tablet TAKE ONE TABLET BY MOUTH TWICE DAILY (Patient taking differently: TAKE ONE TABLET BY MOUTH ONCE DAILY) 60 tablet 9  . Cholecalciferol 1000 UNITS TBDP Take 1 tablet by mouth daily.     [name] doxazosin (CARDURA) 4 MG tablet Take 1 tablet (4 mg total) by mouth daily. 30 tablet 5  . Insulin Detemir (LEVEMIR FLEXPEN) 100 UNIT/ML Pen Inject 45 Units into  the skin daily with supper.     . Insulin Pen Needle (PEN NEEDLES) 31G X 6 MM MISC 1 each by Does not apply route daily. 100 each 3  . losartan-hydrochlorothiazide (HYZAAR) 100-25 MG per tablet Take 1 tablet by mouth daily.     . magnesium oxide (MAG-OX) 400 MG tablet Take 1 tablet (400 mg total) by mouth daily. 90 tablet 2  . metFORMIN (GLUCOPHAGE) 500 MG tablet TAKE TWO TABLETS BY MOUTH TWICE DAILY WITH A MEAL (Patient taking differently: TAKE TWO TABLETS BY MOUTH EVERY MORNING AND ONE TABLET BY MOUTH EVERY EVENING) 120 tablet 5   No current facility-administered medications for this encounter.    Allergies  Allergen Reactions  . Betadine [Povidone Iodine] Other (See Comments)    Patient doesn't remember  . Contrast Media [Iodinated Diagnostic Agents] Other (See Comments)    Patient doesn't remember  . Fish Allergy Swelling    Social History   Social History  . Marital Status: Married    Spouse Name: N/A  . Number of Children: 3  . Years of Education: 12th   Occupational History  . retired    Social History Main Topics  . Smoking status: Never Smoker   . Smokeless tobacco: Never Used  . Alcohol Use: No  . Drug Use: No  . Sexual Activity: Not Currently   Other Topics Concern  . Not on file   Social History Narrative   [name]   Patient is right handed   Patient drinks coffee and sodas    Family History  Problem Relation Age of Onset  . Leukemia Mother   . Heart Problems Father   . Heart Problems      family history  . Diabetes Sister     ? father  . Heart Problems Brother   . Heart attack Neg Hx   . Stroke    . Stroke Brother   . Hypertension Brother   . Cancer Mother   . Cervical cancer Sister     ????    ROS- All systems are reviewed and negative except as per the HPI above  Physical Exam: Filed Vitals:   08/08/15 0837  BP: 142/88  Pulse: 78  Height: 5\' 10"  (1.778 m)  Weight: 235 lb (106.595 kg)    GEN- The  patient is well appearing, alert and oriented x 3 today.   Head- normocephalic, atraumatic Eyes-  Sclera clear, conjunctiva pink Ears- hearing intact Oropharynx- clear Neck- supple, no JVP Lymph- no cervical lymphadenopathy Lungs- Clear to ausculation bilaterally, normal work of breathing Heart- Regular rate and rhythm, no murmurs, rubs or gallops, PMI not laterally displaced GI- soft, NT, ND, + BS Extremities- no clubbing, cyanosis,  or edema MS- no significant deformity or atrophy Skin- no rash or lesion Psych- euthymic mood, full affect Neuro- strength and sensation are intact  EKG-NSR at 75 bpm, pr int 174 ms, qrs int 88 ms, qtc 426 ms Epic records reviewed  Linq device monitoring reviewed Bmet with creat at 1.21,creat cl cal at 88.1 ml/min  Assessment and Plan: 1. Asymptomatic PAF with low burden but with prior stroke Continue carvedilol Continue apixaban appropriately  dosed at 5 mg bid Bleeding precautions discussed Will continue baby asa due to h/o cad/stent Gave pt eliquis assistance forms because he is concerned re cost   afib clinic as needed  Butch Penny C. Carroll, [facility] [address] [place], [place] [address] [phone]

## 2015-08-20 ENCOUNTER — Ambulatory Visit (INDEPENDENT_AMBULATORY_CARE_PROVIDER_SITE_OTHER): Payer: Medicare HMO | Admitting: *Deleted

## 2015-08-20 ENCOUNTER — Encounter: Payer: Medicare HMO | Admitting: Internal Medicine

## 2015-08-20 DIAGNOSIS — I638 Other cerebral infarction: Secondary | ICD-10-CM | POA: Diagnosis not present

## 2015-08-20 DIAGNOSIS — I6389 Other cerebral infarction: Secondary | ICD-10-CM

## 2015-08-20 NOTE — Progress Notes (Signed)
Carelink Summary Report / Loop Recorder 

## 2015-08-23 LAB — CUP PACEART REMOTE DEVICE CHECK: Date Time Interrogation Session: 20170525140727

## 2015-08-29 ENCOUNTER — Other Ambulatory Visit: Payer: Self-pay | Admitting: Family Medicine

## 2015-09-07 LAB — CUP PACEART REMOTE DEVICE CHECK: Date Time Interrogation Session: 20170624143832

## 2015-09-12 ENCOUNTER — Other Ambulatory Visit: Payer: Medicare HMO

## 2015-09-17 ENCOUNTER — Ambulatory Visit (INDEPENDENT_AMBULATORY_CARE_PROVIDER_SITE_OTHER): Payer: Medicare HMO | Admitting: *Deleted

## 2015-09-17 DIAGNOSIS — I638 Other cerebral infarction: Secondary | ICD-10-CM

## 2015-09-17 DIAGNOSIS — I6389 Other cerebral infarction: Secondary | ICD-10-CM

## 2015-09-17 NOTE — Progress Notes (Signed)
Carelink Summary Report / Loop Recorder 

## 2015-09-19 ENCOUNTER — Other Ambulatory Visit (INDEPENDENT_AMBULATORY_CARE_PROVIDER_SITE_OTHER): Payer: Medicare HMO

## 2015-09-19 DIAGNOSIS — I77810 Thoracic aortic ectasia: Secondary | ICD-10-CM | POA: Diagnosis not present

## 2015-09-19 LAB — BASIC METABOLIC PANEL
BUN: 23 mg/dL (ref 7–25)
CO2: 25 mmol/L (ref 20–31)
Calcium: 9.1 mg/dL (ref 8.6–10.3)
Chloride: 105 mmol/L (ref 98–110)
Creat: 1.47 mg/dL — ABNORMAL HIGH (ref 0.70–1.25)
Glucose, Bld: 126 mg/dL — ABNORMAL HIGH (ref 65–99)
Potassium: 4.3 mmol/L (ref 3.5–5.3)
Sodium: 141 mmol/L (ref 135–146)

## 2015-09-24 ENCOUNTER — Other Ambulatory Visit: Payer: Self-pay | Admitting: Family Medicine

## 2015-09-24 NOTE — Telephone Encounter (Signed)
Refill appropriate and filled per protocol. 

## 2015-09-26 ENCOUNTER — Ambulatory Visit (HOSPITAL_COMMUNITY)
Admission: RE | Admit: 2015-09-26 | Discharge: 2015-09-26 | Disposition: A | Discharge status: Home / Self Care | Payer: Medicare HMO | Source: Ambulatory Visit | Attending: Cardiology | Admitting: Cardiology

## 2015-09-26 DIAGNOSIS — I77810 Thoracic aortic ectasia: Secondary | ICD-10-CM | POA: Diagnosis present

## 2015-09-26 DIAGNOSIS — I712 Thoracic aortic aneurysm, without rupture: Secondary | ICD-10-CM | POA: Insufficient documentation

## 2015-09-26 MED ORDER — GADOBENATE DIMEGLUMINE 529 MG/ML IV SOLN
20.0000 mL | Freq: Once | INTRAVENOUS | Status: AC
  Administered 2015-09-26: 20 mL via INTRAVENOUS

## 2015-09-27 ENCOUNTER — Ambulatory Visit (HOSPITAL_COMMUNITY): Payer: Medicare HMO

## 2015-09-29 ENCOUNTER — Other Ambulatory Visit: Payer: Self-pay | Admitting: Family Medicine

## 2015-10-02 ENCOUNTER — Ambulatory Visit (HOSPITAL_BASED_OUTPATIENT_CLINIC_OR_DEPARTMENT_OTHER): Payer: Medicare HMO | Admitting: Oncology

## 2015-10-02 ENCOUNTER — Telehealth: Payer: Self-pay | Admitting: Oncology

## 2015-10-02 ENCOUNTER — Other Ambulatory Visit (HOSPITAL_BASED_OUTPATIENT_CLINIC_OR_DEPARTMENT_OTHER): Payer: Medicare HMO

## 2015-10-02 ENCOUNTER — Encounter: Payer: Self-pay | Admitting: Oncology

## 2015-10-02 VITALS — BP 129/64 | HR 79 | Temp 98.1°F | Resp 18 | Wt 243.9 lb

## 2015-10-02 DIAGNOSIS — C61 Malignant neoplasm of prostate: Secondary | ICD-10-CM

## 2015-10-02 DIAGNOSIS — D751 Secondary polycythemia: Secondary | ICD-10-CM

## 2015-10-02 LAB — CBC WITH DIFFERENTIAL/PLATELET
BASO%: 0.5 % (ref 0.0–2.0)
Basophils Absolute: 0 10*3/uL (ref 0.0–0.1)
EOS%: 5 % (ref 0.0–7.0)
Eosinophils Absolute: 0.4 10*3/uL (ref 0.0–0.5)
HCT: 54.5 % — ABNORMAL HIGH (ref 38.4–49.9)
HGB: 17.2 g/dL — ABNORMAL HIGH (ref 13.0–17.1)
LYMPH%: 14.9 % (ref 14.0–49.0)
MCH: 29 pg (ref 27.2–33.4)
MCHC: 31.5 g/dL — ABNORMAL LOW (ref 32.0–36.0)
MCV: 91.9 fL (ref 79.3–98.0)
MONO#: 0.6 10*3/uL (ref 0.1–0.9)
MONO%: 9 % (ref 0.0–14.0)
NEUT#: 5 10*3/uL (ref 1.5–6.5)
NEUT%: 70.6 % (ref 39.0–75.0)
Platelets: 221 10*3/uL (ref 140–400)
RBC: 5.93 10*6/uL — ABNORMAL HIGH (ref 4.20–5.82)
RDW: 16.7 % — ABNORMAL HIGH (ref 11.0–14.6)
WBC: 7 10*3/uL (ref 4.0–10.3)
lymph#: 1 10*3/uL (ref 0.9–3.3)

## 2015-10-02 LAB — COMPREHENSIVE METABOLIC PANEL
ALT: 13 U/L (ref 0–55)
AST: 11 U/L (ref 5–34)
Albumin: 3.9 g/dL (ref 3.5–5.0)
Alkaline Phosphatase: 83 U/L (ref 40–150)
Anion Gap: 10 mEq/L (ref 3–11)
BUN: 27.1 mg/dL — ABNORMAL HIGH (ref 7.0–26.0)
CO2: 24 mEq/L (ref 22–29)
Calcium: 9.6 mg/dL (ref 8.4–10.4)
Chloride: 107 mEq/L (ref 98–109)
Creatinine: 1.6 mg/dL — ABNORMAL HIGH (ref 0.7–1.3)
EGFR: 49 mL/min/{1.73_m2} — ABNORMAL LOW (ref >90)
Glucose: 155 mg/dl — ABNORMAL HIGH (ref 70–140)
Potassium: 4.3 mEq/L (ref 3.5–5.1)
Sodium: 141 mEq/L (ref 136–145)
Total Bilirubin: 0.84 mg/dL (ref 0.20–1.20)
Total Protein: 7.8 g/dL (ref 6.4–8.3)

## 2015-10-02 NOTE — Telephone Encounter (Signed)
Gave patient avs report and appointments for [facility]

## 2015-10-02 NOTE — Progress Notes (Signed)
Hematology and Oncology Follow Up Visit  James Mcclain [id number] 11/01/1945 70 y.o. 10/02/2015 10:25 AM James Mcclain, MDPickard, James Mcgee, MD   Principle Diagnosis: 70 year old gentleman with polycythemia diagnosed in July 2015 after presenting with a hemoglobin of 20 and a JAK 2 positive mutation detected on February 2016. 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  Secondary diagnosis:  He was diagnosed with prostate cancer and July 2015. He had a Gleason score of 3+4 = 7 and his PSA was up to 8.4. He is status post radiation therapy completed in March 2017. He was also found to have paroxysmal atrial fibrillation.   Prior Therapy: He received intermittent phlebotomies between March and July 2016 to keep his hematocrit less than 50.  Current therapy:  He continues to be on aspirin and full dose anticoagulation with Eliquis.   Interim History: James Mcclain presents today for a follow-up visit. Since the last visit, he was seen in the emergency department in May 2017 after starting feel poorly. His evaluation revealed the diagnosis of paroxysmal atrial fibrillation and has been rate controlled at this time. He has been followed by cardiology and currently in normal sinus rhythm.   Recently, he feels very well without any symptoms. He denied any chest pain or palpitation denied any fatigue or tiredness. He denied any bleeding or thrombosis episodes at this time. He had any constitutional symptoms of weight loss or appetite changes. He denied any joint discomfort. His quality of life remains excellent.   He does report occasional headaches but no blurry vision. He did not have any syncope or seizures. He does not report any chest pain, palpitation, orthopnea. He does not report any cough, hemoptysis, wheezing or shortness of breath. He does not report any nausea, vomiting, abdominal pain, malaise satiety. He does not  report any constipation, diarrhea, hematochezia or melena. He does not report any frequency urgency or hesitancy. He does not report any skeletal complaints. Rest of his review of systems unremarkable.   Medications: I have reviewed the patient's current medications.  Current Outpatient Prescriptions  Medication Sig Dispense Refill  . allopurinol (ZYLOPRIM) 100 MG tablet TAKE TWO TABLETS BY MOUTH ONCE DAILY (Patient taking differently: TAKE ONE TABLETS BY MOUTH ONCE DAILY) 60 tablet 5  . amLODipine (NORVASC) 10 MG tablet Take 10 mg by mouth daily.     [name] apixaban (ELIQUIS) 5 MG TABS tablet Take 1 tablet (5 mg total) by mouth 2 (two) times daily. 60 tablet 11  . aspirin 81 MG tablet Take 81 mg by mouth daily. Reported on 04/05/2015    . ASSURE COMFORT LANCETS 30G MISC     . atorvastatin (LIPITOR) 40 MG tablet TAKE ONE TABLET BY MOUTH DAILY AT 6 PM (Patient taking differently: TAKE ONE TABLET BY MOUTH DAILY EVERY MORNING) 90 tablet 1  . BAYER CONTOUR TEST test strip     . carvedilol (COREG) 12.5 MG tablet TAKE ONE TABLET BY MOUTH TWICE DAILY (Patient taking differently: TAKE ONE TABLET BY MOUTH ONCE DAILY) 60 tablet 9  . Cholecalciferol 1000 UNITS TBDP Take 1 tablet by mouth daily.     [name] doxazosin (CARDURA) 4 MG tablet Take 1 tablet (4 mg total) by mouth daily. 30 tablet 5  . LEVEMIR FLEXTOUCH 100 UNIT/ML Pen INJECT 45 UNITS INTO THE SKIN DAILY AT 10 PM. 15 pen 0  . losartan-hydrochlorothiazide (HYZAAR) 100-25 MG per tablet Take 1 tablet by mouth daily.     [name]  losartan-hydrochlorothiazide (HYZAAR) 100-25 MG tablet TAKE ONE TABLET BY MOUTH ONCE DAILY 90 tablet 1  . magnesium oxide (MAG-OX) 400 MG tablet Take 1 tablet (400 mg total) by mouth daily. 90 tablet 2  . metFORMIN (GLUCOPHAGE) 500 MG tablet TAKE TWO TABLETS BY MOUTH TWICE DAILY WITH A MEAL (Patient taking differently: TAKE TWO TABLETS BY MOUTH EVERY MORNING AND ONE TABLET BY MOUTH EVERY EVENING) 120 tablet 5  . RELION MINI PEN NEEDLES 31G X 6 MM  MISC USE ONE DAILY 100 each 11   No current facility-administered medications for this visit.      Allergies:  Allergies  Allergen Reactions  . Betadine [Povidone Iodine] Other (See Comments)    Patient doesn't remember  . Contrast Media [Iodinated Diagnostic Agents] Other (See Comments)    Patient doesn't remember  . Fish Allergy Swelling    Past Medical History, Surgical history, Social history, and Family History were reviewed and updated.   Physical Exam: Blood pressure 129/64, pulse 79, temperature 98.1 F (36.7 C), temperature source Oral, resp. rate 18, weight 243 lb 14.4 oz (110.6 kg), SpO2 100 %. ECOG: 0 General appearance: Well-appearing gentleman without distress. Head: Normocephalic, without obvious abnormality no oral thrush noted. Neck: no adenopathy Lymph nodes: Cervical, supraclavicular, and axillary nodes normal. Heart:regular rate and rhythm, S1, S2 normal, no murmur, click, rub or gallop Lung:chest clear, no wheezing, rales, normal symmetric air entry Abdomin: soft, non-tender, without masses or organomegaly no rebound or guarding EXT:no erythema, induration, or nodules Neurological examination: No deficits noted.  Lab Results: Lab Results  Component Value Date   WBC 7.0 10/02/2015   HGB 17.2 (H) 10/02/2015   HCT 54.5 (H) 10/02/2015   MCV 91.9 10/02/2015   PLT 221 10/02/2015     Chemistry      Component Value Date/Time   NA 141 09/19/2015 0815   NA 140 04/04/2015 0849   K 4.3 09/19/2015 0815   K 4.3 04/04/2015 0849   CL 105 09/19/2015 0815   CO2 25 09/19/2015 0815   CO2 28 04/04/2015 0849   BUN 23 09/19/2015 0815   BUN 17.1 04/04/2015 0849   CREATININE 1.47 (H) 09/19/2015 0815   CREATININE 1.2 04/04/2015 0849      Component Value Date/Time   CALCIUM 9.1 09/19/2015 0815   CALCIUM 9.3 04/04/2015 0849   ALKPHOS 67 04/23/2015 1148   ALKPHOS 73 04/04/2015 0849   AST 12 04/23/2015 1148   AST 17 04/04/2015 0849   ALT 12 04/23/2015 1148    ALT 18 04/04/2015 0849   BILITOT 0.9 04/23/2015 1148   BILITOT 0.91 04/04/2015 0849         Impression and Plan:  70 year old gentleman with the following issues:  1. 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 He did have an acute thrombotic event although it is unclear to me whether this event was precipitated by his hypertension and hyperlipidemia among other risk factors.   He is currently on aspirin and Eliquis for a cardiovascular standpoint. Risks and benefits of therapeutic phlebotomy were reviewed and for the time being we continue to defer given the stability of his hemoglobin and the low thrombosis risk at this time because of his chronic anticoagulation. He does not have any symptoms associated with polycythemia. I recommended continued monitoring and institute phlebotomy if needed to in the future. He  2. Prostate cancer: He is status post definitive therapy with radiation completed without  incident.  3. Follow-up: Will be in 6 months for a clinical visit.  [facility], MD 8/8/201710:25 AM

## 2015-10-12 ENCOUNTER — Other Ambulatory Visit: Payer: Self-pay | Admitting: Family Medicine

## 2015-10-12 DIAGNOSIS — I10 Essential (primary) hypertension: Secondary | ICD-10-CM

## 2015-10-13 LAB — CUP PACEART REMOTE DEVICE CHECK: Date Time Interrogation Session: 20170724150819

## 2015-10-13 NOTE — Progress Notes (Signed)
Carelink summary report received. Battery status OK. Normal device function. No new symptom episodes, tachy episodes, brady, or pause episodes. No new AF episodes. Monthly summary reports and ROV/PRN 

## 2015-10-17 ENCOUNTER — Ambulatory Visit (INDEPENDENT_AMBULATORY_CARE_PROVIDER_SITE_OTHER): Payer: Medicare HMO | Admitting: *Deleted

## 2015-10-17 DIAGNOSIS — I638 Other cerebral infarction: Secondary | ICD-10-CM | POA: Diagnosis not present

## 2015-10-17 DIAGNOSIS — I6389 Other cerebral infarction: Secondary | ICD-10-CM

## 2015-10-17 NOTE — Progress Notes (Signed)
Carelink Summary Report / Loop Recorder 

## 2015-10-31 ENCOUNTER — Other Ambulatory Visit: Payer: Self-pay | Admitting: Family Medicine

## 2015-11-03 ENCOUNTER — Other Ambulatory Visit: Payer: Self-pay | Admitting: Family Medicine

## 2015-11-05 ENCOUNTER — Telehealth: Payer: Self-pay | Admitting: Family Medicine

## 2015-11-05 NOTE — Telephone Encounter (Signed)
Patient stopped by states he is in the donut hole he needs to know if we have any samples of levermir or eliquis. If not do we have samples of something else he can take or call in.  Took paper to [employer] Patient is in the waiting room.  CB# [phone]

## 2015-11-06 NOTE — Telephone Encounter (Signed)
Samples given to pt 

## 2015-11-14 LAB — CUP PACEART REMOTE DEVICE CHECK: Date Time Interrogation Session: 20170823150916

## 2015-11-14 NOTE — Progress Notes (Signed)
Carelink summary report received. Battery status OK. Normal device function. No new symptom episodes, tachy episodes, brady, or pause episodes. No new AF episodes. Monthly summary reports and ROV/PRN 

## 2015-11-16 ENCOUNTER — Ambulatory Visit (INDEPENDENT_AMBULATORY_CARE_PROVIDER_SITE_OTHER): Payer: Medicare HMO | Admitting: *Deleted

## 2015-11-16 DIAGNOSIS — I638 Other cerebral infarction: Secondary | ICD-10-CM | POA: Diagnosis not present

## 2015-11-16 DIAGNOSIS — I6389 Other cerebral infarction: Secondary | ICD-10-CM

## 2015-11-16 NOTE — Progress Notes (Signed)
Carelink Summary Report / Loop Recorder 

## 2015-11-20 ENCOUNTER — Ambulatory Visit (INDEPENDENT_AMBULATORY_CARE_PROVIDER_SITE_OTHER): Payer: Medicare HMO | Admitting: Family Medicine

## 2015-11-20 ENCOUNTER — Encounter: Payer: Self-pay | Admitting: Family Medicine

## 2015-11-20 VITALS — BP 126/80 | HR 82 | Temp 98.3°F | Resp 18 | Ht 70.0 in | Wt 250.0 lb

## 2015-11-20 DIAGNOSIS — Z794 Long term (current) use of insulin: Secondary | ICD-10-CM

## 2015-11-20 DIAGNOSIS — I10 Essential (primary) hypertension: Secondary | ICD-10-CM | POA: Diagnosis not present

## 2015-11-20 DIAGNOSIS — E119 Type 2 diabetes mellitus without complications: Secondary | ICD-10-CM | POA: Diagnosis not present

## 2015-11-20 DIAGNOSIS — R5383 Other fatigue: Secondary | ICD-10-CM | POA: Diagnosis not present

## 2015-11-20 DIAGNOSIS — E785 Hyperlipidemia, unspecified: Secondary | ICD-10-CM

## 2015-11-20 LAB — COMPLETE METABOLIC PANEL WITHOUT GFR
ALT: 12 U/L (ref 9–46)
AST: 13 U/L (ref 10–35)
Albumin: 4 g/dL (ref 3.6–5.1)
Alkaline Phosphatase: 61 U/L (ref 40–115)
BUN: 24 mg/dL (ref 7–25)
CO2: 26 mmol/L (ref 20–31)
Calcium: 9.3 mg/dL (ref 8.6–10.3)
Chloride: 101 mmol/L (ref 98–110)
Creat: 1.36 mg/dL — ABNORMAL HIGH (ref 0.70–1.25)
GFR, Est African American: 61 mL/min
GFR, Est Non African American: 53 mL/min — ABNORMAL LOW
Glucose, Bld: 115 mg/dL — ABNORMAL HIGH (ref 70–99)
Potassium: 4.5 mmol/L (ref 3.5–5.3)
Sodium: 140 mmol/L (ref 135–146)
Total Bilirubin: 0.7 mg/dL (ref 0.2–1.2)
Total Protein: 7 g/dL (ref 6.1–8.1)

## 2015-11-20 LAB — LIPID PANEL
Cholesterol: 103 mg/dL — ABNORMAL LOW (ref 125–200)
HDL: 32 mg/dL — ABNORMAL LOW (ref >=40)
LDL Cholesterol: 55 mg/dL (ref <130)
Total CHOL/HDL Ratio: 3.2 Ratio (ref <=5.0)
Triglycerides: 82 mg/dL (ref <150)
VLDL: 16 mg/dL (ref <30)

## 2015-11-20 LAB — TSH: TSH: 0.68 mIU/L (ref 0.40–4.50)

## 2015-11-20 LAB — CBC WITH DIFFERENTIAL/PLATELET
Basophils Absolute: 0 {cells}/uL (ref 0–200)
Basophils Relative: 0 %
Eosinophils Absolute: 426 {cells}/uL (ref 15–500)
Eosinophils Relative: 6 %
HCT: 51.9 % — ABNORMAL HIGH (ref 38.5–50.0)
Hemoglobin: 16.5 g/dL (ref 13.0–17.0)
Lymphocytes Relative: 16 %
Lymphs Abs: 1136 {cells}/uL (ref 850–3900)
MCH: 28.4 pg (ref 27.0–33.0)
MCHC: 31.8 g/dL — ABNORMAL LOW (ref 32.0–36.0)
MCV: 89.3 fL (ref 80.0–100.0)
MPV: 10.2 fL (ref 7.5–12.5)
Monocytes Absolute: 639 {cells}/uL (ref 200–950)
Monocytes Relative: 9 %
Neutro Abs: 4899 {cells}/uL (ref 1500–7800)
Neutrophils Relative %: 69 %
Platelets: 250 10*3/uL (ref 140–400)
RBC: 5.81 MIL/uL — ABNORMAL HIGH (ref 4.20–5.80)
RDW: 16.9 % — ABNORMAL HIGH (ref 11.0–15.0)
WBC: 7.1 10*3/uL (ref 3.8–10.8)

## 2015-11-20 LAB — VITAMIN B12: Vitamin B-12: 456 pg/mL (ref 200–1100)

## 2015-11-20 NOTE — Progress Notes (Signed)
Subjective:    Patient ID: James Mcclain, male    DOB: [birth year], 70 y.o.   MRN: [id number]  HPI He is a very pleasant 70 year old African-American male who is here today for fatigue.  Past medical history significant for coronary artery disease. However he had an echocardiogram in March of last year which showed a normal ejection fraction of 65%. He denies any chest pain shortness of breath or dyspnea on exertion. Instead he reports just feeling tired. He is resting well at night and he gets plenty of sleep. He denies hypersomnolence. Instead of the simple fatigue. He is under more stress caring for his grandson. He does think stress may be playing a role in it. He denies any fevers or chills. He denies any nausea vomiting or diarrhea. He denies any blood in his stool. He is in fact gained 10 pounds since when I last saw him in March. He does equate his fatigue with low sugars. He states that when his sugars are around 80s in the morning he feels the most tired. He denies any hypoglycemic episodes. In fact he denies any sugars over 200. He denies any rashes or muscle or bone pain Past Medical History:  Diagnosis Date  . Arthritis   . Coronary artery disease    a. s/p Xience DES to the LAD 07/2012.  . Dilated aortic root ([place])    a. Mildly dilated by echo 09/2013, f/u MRA scheduled for 12/2014.  [name] Elevated PSA   . Essential hypertension   . History of kidney stones   . Hx of gout   . Hyperlipidemia   . Insulin dependent diabetes mellitus ([place])   . 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 On tele 09/2013.  [name] PFO (patent foramen ovale)    a. By echo 09/2013.  [name] Polycythemia   . Prostate cancer ([place]) 07/15/12   Gleason 7, external beam radiation Tx, Dr. Pilar Jarvis  . PVC's (premature ventricular contractions)   . Stroke [facility])    a. Cryptogenic, 09/2013, event monitor with NSR. Small PFO noted on echo but neg LE duplex.   Past Surgical History:  Procedure Laterality Date  . CORONARY ANGIOPLASTY  WITH STENT PLACEMENT  08/05/2012   LAD, 1 stent  . EP IMPLANTABLE DEVICE N/A 08/23/2014   Procedure: Loop Recorder Insertion;  Surgeon: Evans Lance, MD;  Location: [facility];  Service: Cardiovascular;  Laterality: N/A;  . KIDNEY STONES     2006 - LITHOTRIPSY  . PERCUTANEOUS CORONARY STENT INTERVENTION (PCI-S) N/A 08/05/2012   Procedure: PERCUTANEOUS CORONARY STENT INTERVENTION (PCI-S);  Surgeon: Sherren Mocha, MD;  Location: [facility] CATH LAB;  Service: Cardiovascular;  Laterality: N/A;  . PROSTATE BIOPSY  07/15/2012  . PROSTATE BIOPSY  09/23/2013  . TEE WITHOUT CARDIOVERSION N/A 09/27/2013   Procedure: TRANSESOPHAGEAL ECHOCARDIOGRAM (TEE);  Surgeon: Larey Dresser, MD;  Location: [facility];  Service: Cardiovascular;  Laterality: N/A;  . TONSILLECTOMY     as adult  . TOTAL KNEE ARTHROPLASTY  01/24/2011   Procedure: TOTAL KNEE ARTHROPLASTY;  Surgeon: Alta Corning;  Location: [facility];  Service: Orthopedics;  Laterality: Left;  COMPUTER ASSISTED LEFT  TOTAL KNEE REPLACEMENT. Anesthesia a combination of regional and general.   Current Outpatient Prescriptions on File Prior to Visit  Medication Sig Dispense Refill  . allopurinol (ZYLOPRIM) 100 MG tablet TAKE TWO TABLETS BY MOUTH ONCE DAILY (Patient taking differently: TAKE ONE TABLETS BY MOUTH ONCE DAILY) 60 tablet 5  . amLODipine (NORVASC)  10 MG tablet Take 10 mg by mouth daily.     [name] apixaban (ELIQUIS) 5 MG TABS tablet Take 1 tablet (5 mg total) by mouth 2 (two) times daily. 60 tablet 11  . aspirin 81 MG tablet Take 81 mg by mouth daily. Reported on 04/05/2015    . ASSURE COMFORT LANCETS 30G MISC     . atorvastatin (LIPITOR) 40 MG tablet TAKE ONE TABLET BY MOUTH DAILY AT 6 PM 90 tablet 0  . BAYER CONTOUR TEST test strip     . carvedilol (COREG) 12.5 MG tablet TAKE ONE TABLET BY MOUTH TWICE DAILY (Patient taking differently: TAKE ONE TABLET BY MOUTH ONCE DAILY) 60 tablet 9  . Cholecalciferol 1000 UNITS TBDP Take 1 tablet by mouth daily.       [name] doxazosin (CARDURA) 4 MG tablet TAKE ONE TABLET BY MOUTH ONCE DAILY 30 tablet 5  . LEVEMIR FLEXTOUCH 100 UNIT/ML Pen INJECT 45 UNITS INTO THE SKIN DAILY AT 10 PM 15 pen 3  . losartan-hydrochlorothiazide (HYZAAR) 100-25 MG per tablet Take 1 tablet by mouth daily.     . magnesium oxide (MAG-OX) 400 MG tablet TAKE ONE TABLET BY MOUTH ONCE DAILY    . magnesium oxide (MAG-OX) 400 MG tablet Take 1 tablet (400 mg total) by mouth daily. 90 tablet 2  . metFORMIN (GLUCOPHAGE) 500 MG tablet TAKE TWO TABLETS BY MOUTH TWICE DAILY WITH A MEAL (Patient taking differently: TAKE TWO TABLETS BY MOUTH EVERY MORNING AND ONE TABLET BY MOUTH EVERY EVENING) 120 tablet 5  . RELION MINI PEN NEEDLES 31G X 6 MM MISC USE ONE DAILY 100 each 11   No current facility-administered medications on file prior to visit.    Allergies  Allergen Reactions  . Betadine [Povidone Iodine] Other (See Comments)    Patient doesn't remember  . Contrast Media [Iodinated Diagnostic Agents] Other (See Comments)    Patient doesn't remember  . Fish Allergy Swelling   Social History   Social History  . Marital status: Married    Spouse name: N/A  . Number of children: 3  . Years of education: 12th   Occupational History  . retired    Social History Main Topics  . Smoking status: Never Smoker  . Smokeless tobacco: Never Used  . Alcohol use No  . Drug use: No  . Sexual activity: Not Currently   Other Topics Concern  . Not on file   Social History Narrative   [name]   Patient is right handed   Patient drinks coffee and sodas      Review of Systems  All other systems reviewed and are negative.      Objective:   Physical Exam  Constitutional: He appears well-developed and well-nourished. No distress.  HENT:  Right Ear: External ear normal.  Left Ear: External ear normal.  Nose: Nose normal.  Mouth/Throat: Oropharynx is clear and moist. No oropharyngeal exudate.  Neck: Neck supple.  No JVD present. No thyromegaly present.  Cardiovascular: Normal rate, regular rhythm and normal heart sounds.   No murmur heard. Pulmonary/Chest: Effort normal and breath sounds normal. No respiratory distress. He has no wheezes. He has no rales.  Abdominal: Soft. Bowel sounds are normal. He exhibits no distension. There is no tenderness. There is no rebound and no guarding.  Musculoskeletal: He exhibits no edema.  Lymphadenopathy:    He has no cervical adenopathy.  Neurological: He is alert. He has normal reflexes. No cranial nerve  deficit. He exhibits normal muscle tone. Coordination normal.  Skin: He is not diaphoretic.  Vitals reviewed.         Assessment & Plan:  Diabetes mellitus, type II, insulin dependent ([place]) - Plan: CBC with Differential/Platelet, COMPLETE METABOLIC PANEL WITH GFR, Lipid panel, Hemoglobin A1c, Microalbumin, urine  HLD (hyperlipidemia)  Benign essential HTN  Other fatigue - Plan: TSH, Sedimentation rate, Vitamin B12, Testosterone  Fatigue is a very difficult symptom because it can be tied to some any problems. Therefore we will begin with a blood panel. I will check a CBC to monitor his polycythemia and to evaluate for any signs of bone marrow malignancy. I will check a CMP to monitor his chronic kidney disease to ensure that that has not worsened and to evaluate his liver function test. I will check a hemoglobin A1c to monitor his diabetic control. Hypoglycemia may cause fatigue. We may need to titrate his medication to avoid this. While checking lab work I will also check his cholesterol. Based on his fatigue I will also check a TSH to monitor his thyroid, I will check a vitamin B12 level, I will check a testosterone level, and I will check a sedimentation rate. If all of his labs are controlled or stable, we may want consider stress as a possible cause of his fatigue but this would be a diagnosis of exclusion after a thorough laboratory evaluation.

## 2015-11-21 LAB — HEMOGLOBIN A1C
Hgb A1c MFr Bld: 6.9 % — ABNORMAL HIGH (ref <5.7)
Mean Plasma Glucose: 151 mg/dL

## 2015-11-21 LAB — TESTOSTERONE: Testosterone: 256 ng/dL (ref 250–827)

## 2015-11-21 LAB — MICROALBUMIN, URINE: Microalb, Ur: 18.4 mg/dL

## 2015-11-21 LAB — SEDIMENTATION RATE: Sed Rate: 1 mm/hr (ref 0–20)

## 2015-11-26 ENCOUNTER — Other Ambulatory Visit: Payer: Self-pay | Admitting: Family Medicine

## 2015-12-12 ENCOUNTER — Telehealth: Payer: Self-pay | Admitting: Family Medicine

## 2015-12-12 NOTE — Telephone Encounter (Signed)
Patient got jury summons,would like to know if he can get letter to be excused from this if possible  [phone]

## 2015-12-14 ENCOUNTER — Encounter: Payer: Self-pay | Admitting: Family Medicine

## 2015-12-14 ENCOUNTER — Other Ambulatory Visit: Payer: Self-pay | Admitting: Family Medicine

## 2015-12-14 NOTE — Telephone Encounter (Signed)
Letter is on my desk

## 2015-12-14 NOTE — Telephone Encounter (Signed)
Pt aware to p/u and letter up front

## 2015-12-17 ENCOUNTER — Ambulatory Visit (INDEPENDENT_AMBULATORY_CARE_PROVIDER_SITE_OTHER): Payer: Medicare HMO | Admitting: *Deleted

## 2015-12-17 DIAGNOSIS — I6389 Other cerebral infarction: Secondary | ICD-10-CM

## 2015-12-17 DIAGNOSIS — I638 Other cerebral infarction: Secondary | ICD-10-CM | POA: Diagnosis not present

## 2015-12-17 NOTE — Progress Notes (Signed)
Carelink Summary Report / Loop Recorder 

## 2015-12-21 LAB — CUP PACEART REMOTE DEVICE CHECK: Date Time Interrogation Session: 20170922153744

## 2015-12-21 NOTE — Progress Notes (Signed)
Carelink summary report received. Battery status OK. Normal device function. No new symptom episodes, tachy episodes, brady, or pause episodes. No new AF episodes. Monthly summary reports and ROV/PRN 

## 2015-12-28 ENCOUNTER — Telehealth: Payer: Self-pay | Admitting: Cardiology

## 2015-12-28 NOTE — Telephone Encounter (Signed)
Spoke w/ pt and requested that he send a manual transmission b/c his home monitor has not updated in at least 14 days.   

## 2015-12-31 ENCOUNTER — Telehealth: Payer: Self-pay | Admitting: *Deleted

## 2015-12-31 NOTE — Telephone Encounter (Signed)
Spoke with patient regarding NSVT episode recorded on LINQ on 12/14/15.  Patient was asymptomatic with episode.  He agrees to call our office in the future if he has any syncopal or presyncopal episodes.  Patient appreciative of call and denies additional questions or concerns at this time.

## 2016-01-07 ENCOUNTER — Encounter: Payer: Self-pay | Admitting: Physician Assistant

## 2016-01-07 ENCOUNTER — Ambulatory Visit (INDEPENDENT_AMBULATORY_CARE_PROVIDER_SITE_OTHER): Payer: Medicare HMO | Admitting: Physician Assistant

## 2016-01-07 VITALS — BP 130/86 | HR 90 | Temp 98.8°F | Resp 18 | Wt 250.0 lb

## 2016-01-07 DIAGNOSIS — C61 Malignant neoplasm of prostate: Secondary | ICD-10-CM

## 2016-01-07 DIAGNOSIS — R197 Diarrhea, unspecified: Secondary | ICD-10-CM

## 2016-01-07 DIAGNOSIS — Z7901 Long term (current) use of anticoagulants: Secondary | ICD-10-CM

## 2016-01-07 DIAGNOSIS — Z9229 Personal history of other drug therapy: Secondary | ICD-10-CM

## 2016-01-07 DIAGNOSIS — K921 Melena: Secondary | ICD-10-CM

## 2016-01-07 LAB — CBC
HCT: 52.6 % — ABNORMAL HIGH (ref 38.5–50.0)
Hemoglobin: 16.3 g/dL (ref 13.0–17.0)
MCH: 28.7 pg (ref 27.0–33.0)
MCHC: 31 g/dL — ABNORMAL LOW (ref 32.0–36.0)
MCV: 92.6 fL (ref 80.0–100.0)
Platelets: 231 10*3/uL (ref 140–400)
RBC: 5.68 MIL/uL (ref 4.20–5.80)
RDW: 18.1 % — ABNORMAL HIGH (ref 11.0–15.0)
WBC: 6.7 10*3/uL (ref 3.8–10.8)

## 2016-01-07 NOTE — Progress Notes (Signed)
Patient ID: ALII LARTER MRN: [id number], DOB: 1945/10/31, 70 y.o. Date of Encounter: @DATE @  Chief Complaint:  Chief Complaint  Patient presents with  . Diarrhea  . Blood In Stools    HPI: 70 y.o. year old male  presents with above.   He says that about 2 or 3 months ago he was diagnosed with prostate cancer and was treated with radiation therapy. Visit he was told that it may cause diarrhea. At about that time he did start having diarrhea. This is sometimes he urgently has to go all of a sudden. Visit he is still having diarrhea off and on. Says that last week he saw some blood in the stool / toilet. Since then he saw a small amount of blood on the toilet paper when he wiped. says he is only seen blood these 2 times. Says that he has had a colonoscopy and he thinks the last one was around 5 years ago by Dr. Collene Mares.  He has had no lightheadedness or presyncope or increased shortness of breath.   Past Medical History:  Diagnosis Date  . Arthritis   . Coronary artery disease    a. s/p Xience DES to the LAD 07/2012.  . Dilated aortic root ([place])    a. Mildly dilated by echo 09/2013, f/u MRA scheduled for 12/2014.  [name] Elevated PSA   . Essential hypertension   . History of kidney stones   . Hx of gout   . Hyperlipidemia   . Insulin dependent diabetes mellitus ([place])   . 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 On tele 09/2013.  [name] PFO (patent foramen ovale)    a. By echo 09/2013.  [name] Polycythemia   . Prostate cancer ([place]) 07/15/12   Gleason 7, external beam radiation Tx, Dr. Pilar Jarvis  . PVC's (premature ventricular contractions)   . Stroke [facility])    a. Cryptogenic, 09/2013, event monitor with NSR. Small PFO noted on echo but neg LE duplex.     Home Meds: Outpatient Medications Prior to Visit  Medication Sig Dispense Refill  . allopurinol (ZYLOPRIM) 100 MG tablet TAKE TWO TABLETS BY MOUTH ONCE DAILY (Patient taking differently: TAKE ONE TABLETS BY MOUTH ONCE DAILY) 60 tablet 5  .  amLODipine (NORVASC) 10 MG tablet TAKE ONE TABLET BY MOUTH ONCE DAILY 30 tablet 3  . apixaban (ELIQUIS) 5 MG TABS tablet Take 1 tablet (5 mg total) by mouth 2 (two) times daily. 60 tablet 11  . aspirin 81 MG tablet Take 81 mg by mouth daily. Reported on 04/05/2015    . ASSURE COMFORT LANCETS 30G MISC     . atorvastatin (LIPITOR) 40 MG tablet TAKE ONE TABLET BY MOUTH DAILY AT 6 PM 90 tablet 0  . BAYER CONTOUR TEST test strip     . carvedilol (COREG) 12.5 MG tablet TAKE ONE TABLET BY MOUTH TWICE DAILY (Patient taking differently: TAKE ONE TABLET BY MOUTH ONCE DAILY) 60 tablet 9  . Cholecalciferol 1000 UNITS TBDP Take 1 tablet by mouth daily.     [name] doxazosin (CARDURA) 4 MG tablet TAKE ONE TABLET BY MOUTH ONCE DAILY 30 tablet 5  . LEVEMIR FLEXTOUCH 100 UNIT/ML Pen INJECT 45 UNITS INTO THE SKIN DAILY AT 10 PM 15 pen 3  . losartan-hydrochlorothiazide (HYZAAR) 100-25 MG per tablet Take 1 tablet by mouth daily.     . magnesium oxide (MAG-OX) 400 MG tablet TAKE ONE TABLET BY MOUTH ONCE DAILY    . magnesium oxide (MAG-OX) 400  MG tablet Take 1 tablet (400 mg total) by mouth daily. 90 tablet 2  . metFORMIN (GLUCOPHAGE) 500 MG tablet TAKE TWO TABLETS BY MOUTH TWICE DAILY WITH A MEAL (Patient taking differently: TAKE TWO TABLETS BY MOUTH EVERY MORNING AND ONE TABLET BY MOUTH EVERY EVENING) 120 tablet 5  . RELION MINI PEN NEEDLES 31G X 6 MM MISC USE ONE DAILY 100 each 11  . RELION MINI PEN NEEDLES 31G X 6 MM MISC USE ONE DAILY 50 each 7   No facility-administered medications prior to visit.     Allergies:  Allergies  Allergen Reactions  . Betadine [Povidone Iodine] Other (See Comments)    Patient doesn't remember  . Contrast Media [Iodinated Diagnostic Agents] Other (See Comments)    Patient doesn't remember  . Fish Allergy Swelling    Social History   Social History  . Marital status: Married    Spouse name: N/A  . Number of children: 3  . Years of education: 12th   Occupational History  .  retired    Social History Main Topics  . Smoking status: Never Smoker  . Smokeless tobacco: Never Used  . Alcohol use No  . Drug use: No  . Sexual activity: Not Currently   Other Topics Concern  . Not on file   Social History Narrative   [name]   Patient is right handed   Patient drinks coffee and sodas    Family History  Problem Relation Age of Onset  . Leukemia Mother   . Cancer Mother   . Heart Problems Father   . Diabetes Sister     ? father  . Heart Problems Brother   . Heart Problems      family history  . Stroke    . Stroke Brother   . Hypertension Brother   . Cervical cancer Sister     ????  . Heart attack Neg Hx      Review of Systems:  See HPI for pertinent ROS. All other ROS negative.    Physical Exam: Blood pressure 130/86, pulse 90, temperature 98.8 F (37.1 C), temperature source Oral, resp. rate 18, weight 250 lb (113.4 kg), SpO2 98 %., Body mass index is 35.87 kg/m. General: Appears in no acute distress. Neck: Supple. No thyromegaly. No lymphadenopathy. Lungs: Clear bilaterally to auscultation without wheezes, rales, or rhonchi. Breathing is unlabored. Heart: RRR with S1 S2. No murmurs, rubs, or gallops. Abdomen: Soft, non-tender, non-distended with normoactive bowel sounds. No hepatomegaly. No rebound/guarding. No obvious abdominal masses. Musculoskeletal:  Strength and tone normal for age. Extremities/Skin: Warm and dry. Neuro: Alert and oriented X 3. Moves all extremities spontaneously. Gait is normal.  Psych:  Responds to questions appropriately with a normal affect.     ASSESSMENT AND PLAN:  70 y.o. year old male with    1. Hematochezia Discussed with patient that he has had radiation treatment that has caused some diarrhea and has continued with diarrhea. He is also on Eliquis blood thinner. Will check CBC here in the office and review those results to determine if he has had any significant amount of  blood loss. Then will determine urgency of follow-up with GI. Will schedule follow-up with Dr. Collene Mares GI to get her opinion whether further evaluation indicated. Last  H&H--- 11/20/15----16.5/51.9. - CBC CBC was run here and I got the results and reviewed with patient here during this visit. H/H is stable at 16.3/52.6. Will schedule follow-up appointment with  him to follow-up with Dr. Collene Mares in the next 1-2 weeks. Patient to call me if he sees further blood in the interim. He voices understanding and agrees. - Ambulatory referral to Gastroenterology  2. Diarrhea, unspecified type Discussed with patient that he has had radiation treatment that has caused some diarrhea and has continued with diarrhea. He is also on Eliquis blood thinner. Will check CBC here in the office and review those results to determine if he has had any significant amount of blood loss. Then will determine urgency of follow-up with GI. Will schedule follow-up with Dr. Collene Mares GI to get her opinion whether further evaluation indicated. Last  H&H--- 11/20/15----16.5/51.9.  - CBC CBC was run here and I got the results and reviewed with patient here during this visit. H/H is stable at 16.3/52.6. Will schedule follow-up appointment with him to follow-up with Dr. Collene Mares in the next 1-2 weeks. Patient to call me if he sees further blood in the interim. He voices understanding and agrees. - Ambulatory referral to Gastroenterology  3. Prostate cancer [facility]) Discussed with patient that he has had radiation treatment that has caused some diarrhea and has continued with diarrhea. He is also on Eliquis blood thinner. Will check CBC here in the office and review those results to determine if he has had any significant amount of blood loss. Then will determine urgency of follow-up with GI. Will schedule follow-up with Dr. Collene Mares GI to get her opinion whether further evaluation indicated. Last  H&H--- 11/20/15----16.5/51.9.  - CBC CBC was run here  and I got the results and reviewed with patient here during this visit. H/H is stable at 16.3/52.6. Will schedule follow-up appointment with him to follow-up with Dr. Collene Mares in the next 1-2 weeks. Patient to call me if he sees further blood in the interim. He voices understanding and agrees. - Ambulatory referral to Gastroenterology  4. Hx of long term use of blood thinners Discussed with patient that he has had radiation treatment that has caused some diarrhea and has continued with diarrhea. He is also on Eliquis blood thinner. Will check CBC here in the office and review those results to determine if he has had any significant amount of blood loss. Then will determine urgency of follow-up with GI. Will schedule follow-up with Dr. Collene Mares GI to get her opinion whether further evaluation indicated. Last  H&H--- 11/20/15----16.5/51.9.  - CBC CBC was run here and I got the results and reviewed with patient here during this visit. H/H is stable at 16.3/52.6. Will schedule follow-up appointment with him to follow-up with Dr. Collene Mares in the next 1-2 weeks. Patient to call me if he sees further blood in the interim. He voices understanding and agrees. - Ambulatory referral to Gastroenterology   Signed, Olean Ree [place], [place], [facility] 01/07/2016 9:11 AM

## 2016-01-12 LAB — CUP PACEART REMOTE DEVICE CHECK
Date Time Interrogation Session: 20171022173921
Implantable Pulse Generator Implant Date: 20160629

## 2016-01-12 NOTE — Progress Notes (Signed)
Carelink summary report received. Battery status OK. Normal device function. No new symptom episodes, tachy episodes, brady, or pause episodes. No new AF episodes. Monthly summary reports and ROV/PRN 

## 2016-01-15 ENCOUNTER — Ambulatory Visit (INDEPENDENT_AMBULATORY_CARE_PROVIDER_SITE_OTHER): Payer: Medicare HMO | Admitting: *Deleted

## 2016-01-15 ENCOUNTER — Other Ambulatory Visit: Payer: Self-pay | Admitting: Gastroenterology

## 2016-01-15 DIAGNOSIS — I6389 Other cerebral infarction: Secondary | ICD-10-CM

## 2016-01-15 DIAGNOSIS — I638 Other cerebral infarction: Secondary | ICD-10-CM

## 2016-01-15 NOTE — Progress Notes (Signed)
Carelink Summary Report / Loop Recorder 

## 2016-01-19 ENCOUNTER — Other Ambulatory Visit: Payer: Self-pay | Admitting: Family Medicine

## 2016-02-01 ENCOUNTER — Telehealth: Payer: Self-pay | Admitting: Pharmacist

## 2016-02-01 NOTE — Telephone Encounter (Signed)
Received fax from Dr Benson Norway that pt needs colonoscopy on 02/08/16 with request to hold Eliquis. Pt takes Eliquis for afib with CHADS2 score of 4 including prior CVA. Recommend that pt hold Eliquis no longer than 24 hours and resume ASAP after colonoscopy. Clearance faxed to [phone].

## 2016-02-04 ENCOUNTER — Other Ambulatory Visit: Payer: Self-pay | Admitting: Family Medicine

## 2016-02-04 ENCOUNTER — Encounter (HOSPITAL_COMMUNITY): Payer: Self-pay

## 2016-02-07 ENCOUNTER — Other Ambulatory Visit: Payer: Self-pay | Admitting: Family Medicine

## 2016-02-12 ENCOUNTER — Telehealth: Payer: Self-pay | Admitting: Oncology

## 2016-02-12 ENCOUNTER — Telehealth: Payer: Self-pay | Admitting: Family Medicine

## 2016-02-12 MED ORDER — BASAGLAR KWIKPEN 100 UNIT/ML ~~LOC~~ SOPN: 45.0000 [IU] | PEN_INJECTOR | Freq: Every day | SUBCUTANEOUS | 0 refills | Status: DC

## 2016-02-12 NOTE — Telephone Encounter (Signed)
Patient calling for samples of basaglar  [phone] he is in [place]

## 2016-02-12 NOTE — Telephone Encounter (Signed)
Sample in fridge and voucher left up front and pt is aware

## 2016-02-12 NOTE — Telephone Encounter (Signed)
MAILED RECORDS TO [facility]-RISK ADJUSTMENT °

## 2016-02-13 ENCOUNTER — Encounter (HOSPITAL_COMMUNITY): Payer: Self-pay

## 2016-02-14 ENCOUNTER — Ambulatory Visit (INDEPENDENT_AMBULATORY_CARE_PROVIDER_SITE_OTHER): Payer: Medicare HMO | Admitting: *Deleted

## 2016-02-14 DIAGNOSIS — I638 Other cerebral infarction: Secondary | ICD-10-CM | POA: Diagnosis not present

## 2016-02-14 DIAGNOSIS — I6389 Other cerebral infarction: Secondary | ICD-10-CM

## 2016-02-14 NOTE — Progress Notes (Signed)
Carelink Summary Report / Loop Recorder 

## 2016-02-21 NOTE — H&P (Signed)
James Mcclain HPI: This 70 year old black male presents to the office for colorectal cancer screening. He has had changes in his bowel habits over the last year. He was diagnosed with prostate cancer this year and was treated with radiation. Since that time he has had diarrhea and has been averaging 3-4 BM's per day. The diarrhea seems to occur off and on. He usually he has 1 BM per day. He saw blood in the stool last week on one occasion. He has good appetite and his weight has been stable. He denies having any complaints of abdominal pain, nausea, vomiting, acid reflux, dysphagia or odynophagia. He denies having a family history of colon cancer, celiac sprue or IBD. His last colonoscopy done on 09/27/2004 revealed internal and external hemorrhoids and diverticula in the sigmoid and descending colon.   Past Medical History:  Diagnosis Date  . Arthritis   . Coronary artery disease    a. s/p Xience DES to the LAD 07/2012.  . Dilated aortic root ([place])    a. Mildly dilated by echo 09/2013, f/u MRA scheduled for 12/2014.  [name] Elevated PSA   . Essential hypertension   . History of kidney stones   . Hx of gout   . Hyperlipidemia   . Insulin dependent diabetes mellitus ([place])   . 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 On tele 09/2013.  [name] PFO (patent foramen ovale)    a. By echo 09/2013.  [name] Polycythemia   . Prostate cancer ([place]) 07/15/12   Gleason 7, external beam radiation Tx, Dr. Pilar Jarvis  . PVC's (premature ventricular contractions)   . Stroke [facility])    a. Cryptogenic, 09/2013, event monitor with NSR. Small PFO noted on echo but neg LE duplex.    Past Surgical History:  Procedure Laterality Date  . CORONARY ANGIOPLASTY WITH STENT PLACEMENT  08/05/2012   LAD, 1 stent  . EP IMPLANTABLE DEVICE N/A 08/23/2014   Procedure: Loop Recorder Insertion;  Surgeon: Evans Lance, MD;  Location: [place] CV LAB;  Service: Cardiovascular;  Laterality: N/A;  . KIDNEY STONES     2006 - LITHOTRIPSY  .  PERCUTANEOUS CORONARY STENT INTERVENTION (PCI-S) N/A 08/05/2012   Procedure: PERCUTANEOUS CORONARY STENT INTERVENTION (PCI-S);  Surgeon: Sherren Mocha, MD;  Location: [facility] CATH LAB;  Service: Cardiovascular;  Laterality: N/A;  . PROSTATE BIOPSY  07/15/2012  . PROSTATE BIOPSY  09/23/2013  . TEE WITHOUT CARDIOVERSION N/A 09/27/2013   Procedure: TRANSESOPHAGEAL ECHOCARDIOGRAM (TEE);  Surgeon: Larey Dresser, MD;  Location: [place];  Service: Cardiovascular;  Laterality: N/A;  . TONSILLECTOMY     as adult  . TOTAL KNEE ARTHROPLASTY  01/24/2011   Procedure: TOTAL KNEE ARTHROPLASTY;  Surgeon: Alta Corning;  Location: [place];  Service: Orthopedics;  Laterality: Left;  COMPUTER ASSISTED LEFT  TOTAL KNEE REPLACEMENT. Anesthesia a combination of regional and general.    Family History  Problem Relation Age of Onset  . Leukemia Mother   . Cancer Mother   . Heart Problems Father   . Diabetes Sister     ? father  . Heart Problems Brother   . Heart Problems      family history  . Stroke    . Stroke Brother   . Hypertension Brother   . Cervical cancer Sister     ????  . Heart attack Neg Hx     Social History:  reports that he has never smoked. He has never used smokeless tobacco. He reports  that he does not drink alcohol or use drugs.  Allergies:  Allergies  Allergen Reactions  . Betadine [Povidone Iodine] Other (See Comments)    Patient doesn't remember  . Contrast Media [Iodinated Diagnostic Agents] Other (See Comments)    Patient doesn't remember  . Fish Allergy Swelling    Medications: Scheduled: Continuous:  No results found for this or any previous visit (from the past 24 hour(s)).   No results found.  ROS:  As stated above in the HPI otherwise negative.  There were no vitals taken for this visit.    PE: Gen: NAD, Alert and Oriented HEENT:  [place]/AT, EOMI Neck: Supple, no LAD Lungs: CTA Bilaterally CV: RRR without M/G/R ABM: Soft, NTND, +BS Ext: No  C/C/E  Assessment/Plan: 1) Screening colonoscopy. 2) ? Radiation proctitis.  Sheliah Fiorillo D 02/21/2016, 3:00 PM

## 2016-02-22 ENCOUNTER — Encounter (HOSPITAL_COMMUNITY)
Admission: RE | Disposition: A | Discharge status: Home / Self Care | Payer: Self-pay | Source: Ambulatory Visit | Attending: Gastroenterology

## 2016-02-22 ENCOUNTER — Ambulatory Visit (HOSPITAL_COMMUNITY): Payer: Medicare HMO | Admitting: Anesthesiology

## 2016-02-22 ENCOUNTER — Encounter (HOSPITAL_COMMUNITY): Payer: Self-pay

## 2016-02-22 ENCOUNTER — Ambulatory Visit (HOSPITAL_COMMUNITY)
Admission: RE | Admit: 2016-02-22 | Discharge: 2016-02-22 | Disposition: A | Discharge status: Home / Self Care | Payer: Medicare HMO | Source: Ambulatory Visit | Attending: Gastroenterology | Admitting: Gastroenterology

## 2016-02-22 DIAGNOSIS — Z794 Long term (current) use of insulin: Secondary | ICD-10-CM | POA: Diagnosis not present

## 2016-02-22 DIAGNOSIS — K627 Radiation proctitis: Secondary | ICD-10-CM | POA: Insufficient documentation

## 2016-02-22 DIAGNOSIS — Z8673 Personal history of transient ischemic attack (TIA), and cerebral infarction without residual deficits: Secondary | ICD-10-CM | POA: Diagnosis not present

## 2016-02-22 DIAGNOSIS — E785 Hyperlipidemia, unspecified: Secondary | ICD-10-CM | POA: Diagnosis not present

## 2016-02-22 DIAGNOSIS — K921 Melena: Secondary | ICD-10-CM | POA: Diagnosis present

## 2016-02-22 DIAGNOSIS — K5730 Diverticulosis of large intestine without perforation or abscess without bleeding: Secondary | ICD-10-CM | POA: Diagnosis not present

## 2016-02-22 DIAGNOSIS — I2510 Atherosclerotic heart disease of native coronary artery without angina pectoris: Secondary | ICD-10-CM | POA: Insufficient documentation

## 2016-02-22 DIAGNOSIS — M1990 Unspecified osteoarthritis, unspecified site: Secondary | ICD-10-CM | POA: Insufficient documentation

## 2016-02-22 DIAGNOSIS — Z8546 Personal history of malignant neoplasm of prostate: Secondary | ICD-10-CM | POA: Insufficient documentation

## 2016-02-22 DIAGNOSIS — E119 Type 2 diabetes mellitus without complications: Secondary | ICD-10-CM | POA: Diagnosis not present

## 2016-02-22 DIAGNOSIS — I10 Essential (primary) hypertension: Secondary | ICD-10-CM | POA: Diagnosis not present

## 2016-02-22 HISTORY — PX: COLONOSCOPY WITH PROPOFOL: SHX5780

## 2016-02-22 LAB — GLUCOSE, CAPILLARY
Glucose-Capillary: 106 mg/dL — ABNORMAL HIGH (ref 65–99)
Glucose-Capillary: 113 mg/dL — ABNORMAL HIGH (ref 65–99)

## 2016-02-22 SURGERY — COLONOSCOPY WITH PROPOFOL
Anesthesia: Monitor Anesthesia Care

## 2016-02-22 MED ORDER — LIDOCAINE 2% (20 MG/ML) 5 ML SYRINGE
INTRAMUSCULAR | Status: DC | PRN
Start: 2016-02-22 — End: 2016-02-22
  Administered 2016-02-22: 40 mg via INTRAVENOUS

## 2016-02-22 MED ORDER — LACTATED RINGERS IV SOLN
INTRAVENOUS | Status: DC
  Administered 2016-02-22: 10:00:00 via INTRAVENOUS

## 2016-02-22 MED ORDER — ONDANSETRON HCL 4 MG/2ML IJ SOLN
INTRAMUSCULAR | Status: AC
  Filled 2016-02-22: qty 2

## 2016-02-22 MED ORDER — ONDANSETRON HCL 4 MG/2ML IJ SOLN
INTRAMUSCULAR | Status: DC | PRN
  Administered 2016-02-22: 4 mg via INTRAVENOUS

## 2016-02-22 MED ORDER — PROPOFOL 500 MG/50ML IV EMUL
INTRAVENOUS | Status: DC | PRN
Start: 2016-02-22 — End: 2016-02-22
  Administered 2016-02-22: 100 ug/kg/min via INTRAVENOUS

## 2016-02-22 MED ORDER — PROPOFOL 10 MG/ML IV BOLUS
INTRAVENOUS | Status: AC
  Filled 2016-02-22: qty 60

## 2016-02-22 MED ORDER — LIDOCAINE 2% (20 MG/ML) 5 ML SYRINGE
INTRAMUSCULAR | Status: AC
  Filled 2016-02-22: qty 5

## 2016-02-22 MED ORDER — SODIUM CHLORIDE 0.9 % IV SOLN: INTRAVENOUS | Status: DC

## 2016-02-22 MED ORDER — PROPOFOL 10 MG/ML IV BOLUS
INTRAVENOUS | Status: DC | PRN
  Administered 2016-02-22: 10 mg via INTRAVENOUS
  Administered 2016-02-22 (×2): 30 mg via INTRAVENOUS

## 2016-02-22 SURGICAL SUPPLY — 22 items

## 2016-02-22 NOTE — Op Note (Signed)
[facility] Patient Name: James Mcclain Procedure Date: 02/22/2016 MRN: [id number] Attending MD: Carol Ada , MD Date of Birth: 08-Feb-1946 CSN: [id number] Age: 70 Admit Type: Outpatient Procedure:                Colonoscopy Indications:              Hematochezia Providers:                Carol Ada, MD, Hilma Favors, RN, William Dalton, Technician Referring MD:              Medicines:                Propofol per Anesthesia Complications:            No immediate complications. Estimated Blood Loss:     Estimated blood loss: none. Procedure:                Pre-Anesthesia Assessment:                           - Prior to the procedure, a History and Physical                            was performed, and patient medications and                            allergies were reviewed. The patient's tolerance of                            previous anesthesia was also reviewed. The risks                            and benefits of the procedure and the sedation                            options and risks were discussed with the patient.                            All questions were answered, and informed consent                            was obtained. Prior Anticoagulants: The patient has                            taken Eliquis (apixaban), last dose was 5 days                            prior to procedure. ASA Grade Assessment: III - A                            patient with severe systemic disease. After                            reviewing the risks  and benefits, the patient was                            deemed in satisfactory condition to undergo the                            procedure.                           - Sedation was administered by an anesthesia                            professional. Deep sedation was attained.                           After obtaining informed consent, the colonoscope                            was passed under  direct vision. Throughout the                            procedure, the patient's blood pressure, pulse, and                            oxygen saturations were monitored continuously. The                            [id number] scope was introduced through                            the anus and advanced to the the cecum, identified                            by appendiceal orifice and ileocecal valve. The                            colonoscopy was performed without difficulty. The                            patient tolerated the procedure well. The quality                            of the bowel preparation was excellent. The                            ileocecal valve, appendiceal orifice, and rectum                            were photographed. Scope In: 10:32:04 AM Scope Out: 10:49:33 AM Scope Withdrawal Time: 0 hours 10 minutes 50 seconds  Total Procedure Duration: 0 hours 17 minutes 29 seconds  Findings:      Multiple small patchy angioectasias without bleeding were found in the       rectum consistent with a radiation proctitis.      Scattered small and large-mouthed diverticula were found  in the sigmoid       colon, descending colon, transverse colon and ascending colon. Impression:               - Mild radiation proctitis.                           - Diverticulosis in the sigmoid colon, in the                            descending colon, in the transverse colon and in                            the ascending colon.                           - No specimens collected. Moderate Sedation:      N/A- Per Anesthesia Care Recommendation:           - Patient has a contact number available for                            emergencies. The signs and symptoms of potential                            delayed complications were discussed with the                            patient. Return to normal activities tomorrow.                            Written discharge instructions were  provided to the                            patient.                           - Resume previous diet.                           - Continue present medications.                           - No repeat colonoscopy due to age and the absence                            of advanced adenomas. Procedure Code(s):        --- Professional ---                           [phone], Colonoscopy, flexible; diagnostic, including                            collection of specimen(s) by brushing or washing,                            when performed (separate procedure) Diagnosis Code(s):        ---  Professional ---                           K55.20, Angiodysplasia of colon without hemorrhage                           K92.1, Melena (includes Hematochezia)                           K57.30, Diverticulosis of large intestine without                            perforation or abscess without bleeding CPT copyright 2016 American Medical Association. All rights reserved. The codes documented in this report are preliminary and upon coder review may  be revised to meet current compliance requirements. Carol Ada, MD Carol Ada, MD 02/22/2016 10:57:56 AM This report has been signed electronically. Number of Addenda: 0

## 2016-02-22 NOTE — Discharge Instructions (Signed)

## 2016-02-22 NOTE — Anesthesia Preprocedure Evaluation (Signed)
Anesthesia Evaluation  Patient identified by MRN, date of birth, ID band Patient awake    Reviewed: Allergy & Precautions, NPO status , Patient's Chart, lab work & pertinent test results  Airway Mallampati: II  TM Distance: >3 FB Neck ROM: Full    Dental   Pulmonary    Pulmonary exam normal        Cardiovascular hypertension, + CAD  Normal cardiovascular exam     Neuro/Psych    GI/Hepatic   Endo/Other  diabetes, Type 2, Oral Hypoglycemic Agents  Renal/GU      Musculoskeletal   Abdominal   Peds  Hematology   Anesthesia Other Findings   Reproductive/Obstetrics                             Anesthesia Physical Anesthesia Plan  ASA: III  Anesthesia Plan: MAC   Post-op Pain Management:    Induction: Intravenous  Airway Management Planned: Simple Face Mask  Additional Equipment:   Intra-op Plan:   Post-operative Plan:   Informed Consent: I have reviewed the patients History and Physical, chart, labs and discussed the procedure including the risks, benefits and alternatives for the proposed anesthesia with the patient or authorized representative who has indicated his/her understanding and acceptance.     Plan Discussed with: CRNA and Surgeon  Anesthesia Plan Comments:         Anesthesia Quick Evaluation

## 2016-02-22 NOTE — Transfer of Care (Signed)
Immediate Anesthesia Transfer of Care Note  Patient: James Mcclain  Procedure(s) Performed: Procedure(s): COLONOSCOPY WITH PROPOFOL (N/A)  Patient Location: PACU  Anesthesia Type:MAC  Level of Consciousness: awake and alert   Airway & Oxygen Therapy: Patient Spontanous Breathing  Post-op Assessment: Report given to RN  Post vital signs: Reviewed and stable  Last Vitals:  Vitals:   02/22/16 1001  BP: (!) 185/100  Pulse: (!) 105  Resp: 19  Temp: 36.7 C    Last Pain:  Vitals:   02/22/16 1001  TempSrc: Oral         Complications: No apparent anesthesia complications

## 2016-02-22 NOTE — Anesthesia Postprocedure Evaluation (Signed)
Anesthesia Post Note  Patient: James Mcclain  Procedure(s) Performed: Procedure(s) (LRB): COLONOSCOPY WITH PROPOFOL (N/A)  Patient location during evaluation: PACU Anesthesia Type: MAC Level of consciousness: awake and alert Pain management: pain level controlled Vital Signs Assessment: post-procedure vital signs reviewed and stable Respiratory status: spontaneous breathing, nonlabored ventilation, respiratory function stable and patient connected to nasal cannula oxygen Cardiovascular status: stable and blood pressure returned to baseline Anesthetic complications: no       Last Vitals:  Vitals:   02/22/16 1110 02/22/16 1120  BP: 113/71 134/75  Pulse: 88 85  Resp: 15 (!) 21  Temp:      Last Pain:  Vitals:   02/22/16 1056  TempSrc: Oral                 [name]

## 2016-02-26 ENCOUNTER — Encounter (HOSPITAL_COMMUNITY): Payer: Self-pay | Admitting: Gastroenterology

## 2016-02-28 LAB — CUP PACEART REMOTE DEVICE CHECK
Date Time Interrogation Session: 20171121184243
Implantable Pulse Generator Implant Date: 20160629

## 2016-02-28 NOTE — Progress Notes (Signed)
Carelink summary report received. Battery status OK. Normal device function. No new symptom episodes, brady, or pause episodes. No new AF episodes. 1 tchy- ECG appears NSVT 12-14-15, pt asymptomatic per EPIC note. Monthly summary reports and ROV/PRN

## 2016-03-03 ENCOUNTER — Other Ambulatory Visit: Payer: Self-pay | Admitting: Family Medicine

## 2016-03-03 DIAGNOSIS — C61 Malignant neoplasm of prostate: Secondary | ICD-10-CM

## 2016-03-03 DIAGNOSIS — D751 Secondary polycythemia: Secondary | ICD-10-CM

## 2016-03-03 DIAGNOSIS — I480 Paroxysmal atrial fibrillation: Secondary | ICD-10-CM

## 2016-03-03 DIAGNOSIS — I10 Essential (primary) hypertension: Secondary | ICD-10-CM

## 2016-03-03 MED ORDER — ALLOPURINOL 100 MG PO TABS: 200.0000 mg | ORAL_TABLET | Freq: Every day | ORAL | 1 refills | Status: DC

## 2016-03-03 MED ORDER — METFORMIN HCL 500 MG PO TABS: ORAL_TABLET | ORAL | 3 refills | Status: DC

## 2016-03-03 MED ORDER — CARVEDILOL 12.5 MG PO TABS: 12.5000 mg | ORAL_TABLET | Freq: Two times a day (BID) | ORAL | 3 refills | Status: DC

## 2016-03-03 MED ORDER — AMLODIPINE BESYLATE 10 MG PO TABS: 10.0000 mg | ORAL_TABLET | Freq: Every day | ORAL | 1 refills | Status: DC

## 2016-03-03 MED ORDER — APIXABAN 5 MG PO TABS: 5.0000 mg | ORAL_TABLET | Freq: Two times a day (BID) | ORAL | 3 refills | Status: DC

## 2016-03-03 MED ORDER — MAGNESIUM OXIDE 400 MG PO TABS: 400.0000 mg | ORAL_TABLET | Freq: Every day | ORAL | 2 refills | Status: DC

## 2016-03-03 MED ORDER — DOXAZOSIN MESYLATE 4 MG PO TABS: 4.0000 mg | ORAL_TABLET | Freq: Every day | ORAL | 3 refills | Status: DC

## 2016-03-03 MED ORDER — BASAGLAR KWIKPEN 100 UNIT/ML ~~LOC~~ SOPN: 45.0000 [IU] | PEN_INJECTOR | Freq: Every day | SUBCUTANEOUS | 1 refills | Status: DC

## 2016-03-03 MED ORDER — ATORVASTATIN CALCIUM 40 MG PO TABS: 40.0000 mg | ORAL_TABLET | Freq: Every day | ORAL | 1 refills | Status: DC

## 2016-03-03 MED ORDER — LOSARTAN POTASSIUM-HCTZ 100-25 MG PO TABS: 1.0000 | ORAL_TABLET | Freq: Every day | ORAL | 3 refills | Status: DC

## 2016-03-03 NOTE — Addendum Note (Signed)
Addended by: Shary Decamp B on: 03/03/2016 04:24 PM   Modules accepted: Orders

## 2016-03-06 ENCOUNTER — Other Ambulatory Visit: Payer: Self-pay | Admitting: Family Medicine

## 2016-03-08 ENCOUNTER — Other Ambulatory Visit: Payer: Self-pay | Admitting: Family Medicine

## 2016-03-14 MED ORDER — BASAGLAR KWIKPEN 100 UNIT/ML ~~LOC~~ SOPN: 45.0000 [IU] | PEN_INJECTOR | Freq: Every day | SUBCUTANEOUS | 1 refills | Status: DC

## 2016-03-14 MED ORDER — METFORMIN HCL 500 MG PO TABS: ORAL_TABLET | ORAL | 3 refills | Status: DC

## 2016-03-14 NOTE — Addendum Note (Signed)
Addended by: Shary Decamp B on: 03/14/2016 01:05 PM   Modules accepted: Orders

## 2016-03-17 ENCOUNTER — Ambulatory Visit (INDEPENDENT_AMBULATORY_CARE_PROVIDER_SITE_OTHER): Payer: Medicare HMO | Admitting: *Deleted

## 2016-03-17 DIAGNOSIS — I638 Other cerebral infarction: Secondary | ICD-10-CM

## 2016-03-17 DIAGNOSIS — I6389 Other cerebral infarction: Secondary | ICD-10-CM

## 2016-03-18 NOTE — Progress Notes (Signed)
Carelink Summary Report / Loop Recorder 

## 2016-03-19 ENCOUNTER — Telehealth: Payer: Self-pay | Admitting: Family Medicine

## 2016-03-19 NOTE — Telephone Encounter (Signed)
Calling to clarify doses ordered on Basaglar and Metformin.  Appears correct amts were ordered on 03/14/16.  Tried to call them, left message to call back

## 2016-03-28 ENCOUNTER — Emergency Department (HOSPITAL_COMMUNITY)
Admission: EM | Admit: 2016-03-28 | Discharge: 2016-03-29 | Disposition: A | Discharge status: Home / Self Care | Payer: PPO | Attending: Emergency Medicine | Admitting: Emergency Medicine

## 2016-03-28 ENCOUNTER — Ambulatory Visit (HOSPITAL_COMMUNITY)
Admission: EM | Admit: 2016-03-28 | Discharge: 2016-03-28 | Disposition: A | Discharge status: Home / Self Care | Payer: PPO | Attending: Family Medicine | Admitting: Family Medicine

## 2016-03-28 ENCOUNTER — Encounter (HOSPITAL_COMMUNITY): Payer: Self-pay | Admitting: Emergency Medicine

## 2016-03-28 ENCOUNTER — Encounter (HOSPITAL_COMMUNITY): Payer: Self-pay | Admitting: Family Medicine

## 2016-03-28 DIAGNOSIS — K625 Hemorrhage of anus and rectum: Secondary | ICD-10-CM

## 2016-03-28 DIAGNOSIS — Z951 Presence of aortocoronary bypass graft: Secondary | ICD-10-CM | POA: Insufficient documentation

## 2016-03-28 DIAGNOSIS — Z8673 Personal history of transient ischemic attack (TIA), and cerebral infarction without residual deficits: Secondary | ICD-10-CM | POA: Diagnosis not present

## 2016-03-28 DIAGNOSIS — Z96652 Presence of left artificial knee joint: Secondary | ICD-10-CM | POA: Diagnosis not present

## 2016-03-28 DIAGNOSIS — Z794 Long term (current) use of insulin: Secondary | ICD-10-CM | POA: Insufficient documentation

## 2016-03-28 DIAGNOSIS — Z79899 Other long term (current) drug therapy: Secondary | ICD-10-CM | POA: Diagnosis not present

## 2016-03-28 DIAGNOSIS — Z8546 Personal history of malignant neoplasm of prostate: Secondary | ICD-10-CM | POA: Diagnosis not present

## 2016-03-28 DIAGNOSIS — E119 Type 2 diabetes mellitus without complications: Secondary | ICD-10-CM | POA: Diagnosis not present

## 2016-03-28 DIAGNOSIS — Z7982 Long term (current) use of aspirin: Secondary | ICD-10-CM | POA: Diagnosis not present

## 2016-03-28 DIAGNOSIS — I2510 Atherosclerotic heart disease of native coronary artery without angina pectoris: Secondary | ICD-10-CM | POA: Insufficient documentation

## 2016-03-28 DIAGNOSIS — Z7984 Long term (current) use of oral hypoglycemic drugs: Secondary | ICD-10-CM | POA: Insufficient documentation

## 2016-03-28 LAB — COMPREHENSIVE METABOLIC PANEL
ALT: 14 U/L — ABNORMAL LOW (ref 17–63)
AST: 18 U/L (ref 15–41)
Albumin: 3.8 g/dL (ref 3.5–5.0)
Alkaline Phosphatase: 66 U/L (ref 38–126)
Anion gap: 10 (ref 5–15)
BUN: 26 mg/dL — ABNORMAL HIGH (ref 6–20)
CO2: 29 mmol/L (ref 22–32)
Calcium: 9.3 mg/dL (ref 8.9–10.3)
Chloride: 100 mmol/L — ABNORMAL LOW (ref 101–111)
Creatinine, Ser: 1.59 mg/dL — ABNORMAL HIGH (ref 0.61–1.24)
GFR calc Af Amer: 49 mL/min — ABNORMAL LOW (ref >60)
GFR calc non Af Amer: 43 mL/min — ABNORMAL LOW (ref >60)
Glucose, Bld: 124 mg/dL — ABNORMAL HIGH (ref 65–99)
Potassium: 4.2 mmol/L (ref 3.5–5.1)
Sodium: 139 mmol/L (ref 135–145)
Total Bilirubin: 0.9 mg/dL (ref 0.3–1.2)
Total Protein: 7.4 g/dL (ref 6.5–8.1)

## 2016-03-28 LAB — CBC
HCT: 55.2 % — ABNORMAL HIGH (ref 39.0–52.0)
Hemoglobin: 16.7 g/dL (ref 13.0–17.0)
MCH: 26.5 pg (ref 26.0–34.0)
MCHC: 30.3 g/dL (ref 30.0–36.0)
MCV: 87.5 fL (ref 78.0–100.0)
Platelets: 254 10*3/uL (ref 150–400)
RBC: 6.31 MIL/uL — ABNORMAL HIGH (ref 4.22–5.81)
RDW: 18 % — ABNORMAL HIGH (ref 11.5–15.5)
WBC: 8.5 10*3/uL (ref 4.0–10.5)

## 2016-03-28 LAB — TYPE AND SCREEN
ABO/RH(D): A POS
Antibody Screen: NEGATIVE

## 2016-03-28 LAB — POC OCCULT BLOOD, ED: Fecal Occult Bld: POSITIVE — AB

## 2016-03-28 NOTE — ED Triage Notes (Signed)
Pt presents with rectal bleeding that began yesterday; pt reports bright red blood with diarrhea; 2 episodes of diarrhea today; pt denies pain

## 2016-03-28 NOTE — Discharge Instructions (Signed)
Go to ER for further eval

## 2016-03-28 NOTE — ED Provider Notes (Signed)
[place] DEPT Provider Note   CSN: [id number] Arrival date & time: 03/28/16  2120  By signing my name below, I, Reola Mosher, attest that this documentation has been prepared under the direction and in the presence of Deno Etienne, DO. Electronically Signed: Reola Mosher, ED Scribe. 03/28/16. 11:40 PM.  History   Chief Complaint Chief Complaint  Patient presents with  . Rectal Bleeding   The history is provided by the patient and medical records. No language interpreter was used.  Rectal Bleeding  Quality:  Bright red Amount:  Moderate Duration:  2 days Timing:  Intermittent Chronicity:  New Context: diarrhea   Context: not rectal pain   Similar prior episodes: yes   Relieved by:  Nothing Worsened by:  Nothing Ineffective treatments:  None tried Associated symptoms: no abdominal pain, no fever and no vomiting   Risk factors: anticoagulant use   Risk factors: no hx of colorectal cancer     HPI Comments: James Mcclain is a 71 y.o. male with a h/o CAD, DM2, AFIB, who presents to the Emergency Department complaining of intermittent episodes of rectal bleeding beginning two days ago. He reports that his blood is bright red, and that it is passed with every bowel movement, in his stool, in the bowl, and with wiping. Pt states that he has had two bowel movements a day since the onset of his symptoms, and today he has had two bowel movements of diarrhea. He does have a h/o similar symptoms, and has previously had an endoscopy performed. He is currently undergoing radiation therapy for his h/o prostate cancer. Per prior chart review, pt recently underwent colonoscopy on 02/22/16 for a routine colorectal cancer screening and for increased episodes of diarrhea since undergoing radiation. Colonoscopy revealed mild radiation proctitis and diverticulosis in the sigmoid colon, in the descending colon, in the transverse colon and in the ascending colon. He is currently anticoagulated  on 81mg  Asprin and Eliquis therapies. Pt denies anal itching/burning, abdominal pain, nausea, vomiting, or any other associated symptoms.   Past Medical History:  Diagnosis Date  . Arthritis   . Coronary artery disease    a. s/p Xience DES to the LAD 07/2012.  . Dilated aortic root ([place])    a. Mildly dilated by echo 09/2013, f/u MRA scheduled for 12/2014.  [name] Elevated PSA   . Essential hypertension   . History of kidney stones   . Hx of gout   . Hyperlipidemia   . Insulin dependent diabetes mellitus ([place])   . 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 On tele 09/2013.  [name] PFO (patent foramen ovale)    a. By echo 09/2013.  [name] Polycythemia   . Prostate cancer ([place]) 07/15/12   Gleason 7, external beam radiation Tx, Dr. Pilar Jarvis  . PVC's (premature ventricular contractions)   . Stroke [facility])    a. Cryptogenic, 09/2013, event monitor with NSR. Small PFO noted on echo but neg LE duplex.   Patient Active Problem List   Diagnosis Date Noted  . Paroxysmal atrial fibrillation ([place]) 07/30/2015  . PFO (patent foramen ovale)   . Dilated aortic root ([place])   . PVC's (premature ventricular contractions)   . Insulin dependent diabetes mellitus ([place])   . Stroke ([facility])   . Coronary artery disease   . Multifocal atrial tachycardia (HCC)   . Polycythemia 05/16/2014  . History of CVA (cerebrovascular accident) 01/02/2014  . Type 2 diabetes mellitus with other diabetic neurological complication 0000000  . Essential  hypertension 11/28/2013  . Hyperlipidemia 11/28/2013  . Cerebral infarction due to embolism of left middle cerebral artery ([place]) 11/28/2013  . CAD (coronary artery disease) 12/17/2012  . Prostate cancer ([place])   . 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 Exertional dyspnea 06/23/2012  . Murmur 06/23/2012  . DM2 (diabetes mellitus, type 2) ([place])   . Elevated PSA    Past Surgical History:  Procedure Laterality Date  . COLONOSCOPY WITH PROPOFOL N/A 02/22/2016    Procedure: COLONOSCOPY WITH PROPOFOL;  Surgeon: Carol Ada, MD;  [facility];  Service: Endoscopy;  Laterality: N/A;  . CORONARY ANGIOPLASTY WITH STENT PLACEMENT  08/05/2012   LAD, 1 stent  . EP IMPLANTABLE DEVICE N/A 08/23/2014   Procedure: Loop Recorder Insertion;  Surgeon: Evans Lance, MD;  Location: [place] CV LAB;  Service: Cardiovascular;  Laterality: N/A;  . KIDNEY STONES     2006 - LITHOTRIPSY  . PERCUTANEOUS CORONARY STENT INTERVENTION (PCI-S) N/A 08/05/2012   Procedure: PERCUTANEOUS CORONARY STENT INTERVENTION (PCI-S);  Surgeon: Sherren Mocha, MD;  Location: [facility] CATH LAB;  Service: Cardiovascular;  Laterality: N/A;  . PROSTATE BIOPSY  07/15/2012  . PROSTATE BIOPSY  09/23/2013  . TEE WITHOUT CARDIOVERSION N/A 09/27/2013   Procedure: TRANSESOPHAGEAL ECHOCARDIOGRAM (TEE);  Surgeon: Larey Dresser, MD;  Location: [facility];  Service: Cardiovascular;  Laterality: N/A;  . TONSILLECTOMY     as adult  . TOTAL KNEE ARTHROPLASTY  01/24/2011   Procedure: TOTAL KNEE ARTHROPLASTY;  Surgeon: Alta Corning;  Location: [place];  Service: Orthopedics;  Laterality: Left;  COMPUTER ASSISTED LEFT  TOTAL KNEE REPLACEMENT. Anesthesia a combination of regional and general.    Home Medications    Prior to Admission medications   Medication Sig Start Date End Date Taking? Authorizing Provider  allopurinol (ZYLOPRIM) 100 MG tablet Take 2 tablets (200 mg total) by mouth daily. 03/03/16   Susy Frizzle, MD  amLODipine (NORVASC) 10 MG tablet Take 1 tablet (10 mg total) by mouth daily. 03/03/16   Susy Frizzle, MD  apixaban (ELIQUIS) 5 MG TABS tablet Take 1 tablet (5 mg total) by mouth 2 (two) times daily. 03/03/16   Susy Frizzle, MD  aspirin 81 MG tablet Take 81 mg by mouth daily. Reported on 04/05/2015    Historical Provider, MD  atorvastatin (LIPITOR) 40 MG tablet Take 1 tablet (40 mg total) by mouth daily. 03/03/16   Susy Frizzle, MD  carvedilol (COREG) 12.5 MG tablet Take 1 tablet  (12.5 mg total) by mouth 2 (two) times daily. 03/03/16   Susy Frizzle, MD  Cholecalciferol 1000 UNITS TBDP Take 1 tablet by mouth daily.     Historical Provider, MD  doxazosin (CARDURA) 4 MG tablet Take 1 tablet (4 mg total) by mouth daily. 03/03/16   Susy Frizzle, MD  Insulin Glargine (BASAGLAR KWIKPEN) 100 UNIT/ML SOPN Inject 0.45 mLs (45 Units total) into the skin at bedtime. 03/14/16   Susy Frizzle, MD  losartan-hydrochlorothiazide (HYZAAR) 100-25 MG tablet Take 1 tablet by mouth daily. 03/03/16   Susy Frizzle, MD  losartan-hydrochlorothiazide (HYZAAR) 100-25 MG tablet TAKE ONE TABLET BY MOUTH ONCE DAILY 03/10/16   Susy Frizzle, MD  magnesium oxide (MAG-OX) 400 MG tablet Take 1 tablet (400 mg total) by mouth daily. 03/03/16   Susy Frizzle, MD  metFORMIN (GLUCOPHAGE) 500 MG tablet TAKE TWO TABLETS BY MOUTH TWICE DAILY WITH  A  MEAL 03/14/16   Susy Frizzle, MD  Family History Family History  Problem Relation Age of Onset  . Leukemia Mother   . Cancer Mother   . Heart Problems Father   . Diabetes Sister     ? father  . Heart Problems Brother   . Heart Problems      family history  . Stroke    . Stroke Brother   . Hypertension Brother   . Cervical cancer Sister     ????  . Heart attack Neg Hx    Social History Social History  Substance Use Topics  . Smoking status: Never Smoker  . Smokeless tobacco: Never Used  . Alcohol use No   Allergies   Betadine [povidone iodine]; Contrast media [iodinated diagnostic agents]; and Fish allergy  Review of Systems Review of Systems  Constitutional: Negative for chills and fever.  HENT: Negative for congestion and facial swelling.   Eyes: Negative for discharge and visual disturbance.  Respiratory: Negative for shortness of breath.   Cardiovascular: Negative for chest pain and palpitations.  Gastrointestinal: Positive for anal bleeding, blood in stool, diarrhea and hematochezia. Negative for abdominal pain and vomiting.    Musculoskeletal: Negative for arthralgias and myalgias.  Skin: Negative for color change and rash.  Neurological: Negative for tremors, syncope and headaches.  Psychiatric/Behavioral: Negative for confusion and dysphoric mood.  All other systems reviewed and are negative.  Physical Exam Updated Vital Signs BP 136/78   Pulse 87   Temp 98.7 F (37.1 C) (Oral)   Ht 5\' 10"  (1.778 m)   Wt 248 lb (112.5 kg)   SpO2 97%   BMI 35.58 kg/m   Physical Exam  Constitutional: He is oriented to person, place, and time. He appears well-developed and well-nourished.  HENT:  Head: Normocephalic and atraumatic.  Eyes: EOM are normal. Pupils are equal, round, and reactive to light.  Neck: Normal range of motion. Neck supple. No JVD present.  Cardiovascular: Normal rate and regular rhythm.  Exam reveals no gallop and no friction rub.   No murmur heard. Pulmonary/Chest: No respiratory distress. He has no wheezes.  Abdominal: Soft. He exhibits no distension. There is no tenderness. There is no rebound and no guarding.  Genitourinary: Rectal exam shows guaiac positive stool. Rectal exam shows no external hemorrhoid, no internal hemorrhoid, no fissure and no tenderness.  Genitourinary Comments: Chaperone present throughout entire exam. Soft brown stool on DRE.   Musculoskeletal: Normal range of motion.  Neurological: He is alert and oriented to person, place, and time.  Skin: No rash noted. No pallor.  Psychiatric: He has a normal mood and affect. His behavior is normal.  Nursing note and vitals reviewed.  ED Treatments / Results  DIAGNOSTIC STUDIES: Oxygen Saturation is 98% on RA, normal by my interpretation.   COORDINATION OF CARE: 11:40 PM-Discussed next steps with pt. Pt verbalized understanding and is agreeable with the plan.   Labs (all labs ordered are listed, but only abnormal results are displayed) Labs Reviewed  COMPREHENSIVE METABOLIC PANEL - Abnormal; Notable for the following:        Result Value   Chloride 100 (*)    Glucose, Bld 124 (*)    BUN 26 (*)    Creatinine, Ser 1.59 (*)    ALT 14 (*)    GFR calc non Af Amer 43 (*)    GFR calc Af Amer 49 (*)    All other components within normal limits  CBC - Abnormal; Notable for the following:    RBC 6.31 (*)  HCT 55.2 (*)    RDW 18.0 (*)    All other components within normal limits  POC OCCULT BLOOD, ED - Abnormal; Notable for the following:    Fecal Occult Bld POSITIVE (*)    All other components within normal limits  TYPE AND SCREEN   EKG  EKG Interpretation None      Radiology No results found.  Procedures Procedures   Medications Ordered in ED Medications - No data to display  Initial Impression / Assessment and Plan / ED Course  I have reviewed the triage vital signs and the nursing notes.  Pertinent labs & imaging results that were available during my care of the patient were reviewed by me and considered in my medical decision making (see chart for details).     71 yo M With a chief complaint of bright red blood per rectum. Patient has had a couple of these bowel movements a day for the past couple days. His vital signs are stable. I discussed risks and benefits of admission with it being flu season. Patient electing for follow-up with gastroenterologist first thing on Monday. He'll return for any sudden worsening bleeding or feeling near syncopal.   11:59 PM:  I have discussed the diagnosis/risks/treatment options with the patient and family and believe the pt to be eligible for discharge home to follow-up with GI. We also discussed returning to the ED immediately if new or worsening sx occur. We discussed the sx which are most concerning (e.g., sudden worsening bleeding, feeling like he will pass out, weakness) that necessitate immediate return. Medications administered to the patient during their visit and any new prescriptions provided to the patient are listed below.  Medications given  during this visit Medications - No data to display   The patient appears reasonably screen and/or stabilized for discharge and I doubt any other medical condition or other [facility] requiring further screening, evaluation, or treatment in the ED at this time prior to discharge.    Final Clinical Impressions(s) / ED Diagnoses   Final diagnoses:  Rectal bleeding   New Prescriptions New Prescriptions   No medications on file   I personally performed the services described in this documentation, which was scribed in my presence. The recorded information has been reviewed and is accurate.     Deno Etienne, DO 03/28/16 2359

## 2016-03-28 NOTE — Discharge Instructions (Signed)
Follow up with your GI doctor first thing on Monday.  If you bleeding worsens, you feel light headed, dizzy, sob return immediately for admission.

## 2016-03-28 NOTE — ED Provider Notes (Signed)
[place]    CSN: [id number] Arrival date & time: 03/28/16  1933     History   Chief Complaint Chief Complaint  Patient presents with  . Rectal Bleeding    HPI James Mcclain is a 71 y.o. male.   The history is provided by the patient and a relative.  Rectal Bleeding  Quality:  Bright red Amount:  Moderate Duration:  3 days Timing:  Constant Chronicity:  New Context: diarrhea and spontaneously   Context: not anal fissures, not anal penetration, not constipation, not hemorrhoids, not rectal injury and not rectal pain   Similar prior episodes: no   Associated symptoms: no dizziness, no fever and no vomiting   Risk factors comment:  Rad therapy for prostate ca, had neg colonoscopy in jan,2018   Past Medical History:  Diagnosis Date  . Arthritis   . Coronary artery disease    a. s/p Xience DES to the LAD 07/2012.  . Dilated aortic root ([place])    a. Mildly dilated by echo 09/2013, f/u MRA scheduled for 12/2014.  [name] Elevated PSA   . Essential hypertension   . History of kidney stones   . Hx of gout   . Hyperlipidemia   . Insulin dependent diabetes mellitus ([place])   . 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 On tele 09/2013.  [name] PFO (patent foramen ovale)    a. By echo 09/2013.  [name] Polycythemia   . Prostate cancer ([place]) 07/15/12   Gleason 7, external beam radiation Tx, Dr. Pilar Jarvis  . PVC's (premature ventricular contractions)   . Stroke [facility])    a. Cryptogenic, 09/2013, event monitor with NSR. Small PFO noted on echo but neg LE duplex.    Patient Active Problem List   Diagnosis Date Noted  . Paroxysmal atrial fibrillation ([place]) 07/30/2015  . PFO (patent foramen ovale)   . Dilated aortic root ([place])   . PVC's (premature ventricular contractions)   . Insulin dependent diabetes mellitus ([place])   . Stroke ([place])   . Coronary artery disease   . Multifocal atrial tachycardia (HCC)   . Polycythemia 05/16/2014  . History of CVA (cerebrovascular accident) 01/02/2014    . Type 2 diabetes mellitus with other diabetic neurological complication 0000000  . Essential hypertension 11/28/2013  . Hyperlipidemia 11/28/2013  . Cerebral infarction due to embolism of left middle cerebral artery ([place]) 11/28/2013  . CAD (coronary artery disease) 12/17/2012  . Prostate cancer ([place])   . 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 Exertional dyspnea 06/23/2012  . Murmur 06/23/2012  . DM2 (diabetes mellitus, type 2) ([place])   . Elevated PSA     Past Surgical History:  Procedure Laterality Date  . COLONOSCOPY WITH PROPOFOL N/A 02/22/2016   Procedure: COLONOSCOPY WITH PROPOFOL;  Surgeon: Carol Ada, MD;  [facility];  Service: Endoscopy;  Laterality: N/A;  . CORONARY ANGIOPLASTY WITH STENT PLACEMENT  08/05/2012   LAD, 1 stent  . EP IMPLANTABLE DEVICE N/A 08/23/2014   Procedure: Loop Recorder Insertion;  Surgeon: Evans Lance, MD;  Location: [place] CV LAB;  Service: Cardiovascular;  Laterality: N/A;  . KIDNEY STONES     2006 - LITHOTRIPSY  . PERCUTANEOUS CORONARY STENT INTERVENTION (PCI-S) N/A 08/05/2012   Procedure: PERCUTANEOUS CORONARY STENT INTERVENTION (PCI-S);  Surgeon: Sherren Mocha, MD;  Location: [facility] CATH LAB;  Service: Cardiovascular;  Laterality: N/A;  . PROSTATE BIOPSY  07/15/2012  . PROSTATE BIOPSY  09/23/2013  . TEE WITHOUT CARDIOVERSION  N/A 09/27/2013   Procedure: TRANSESOPHAGEAL ECHOCARDIOGRAM (TEE);  Surgeon: Larey Dresser, MD;  Location: [place];  Service: Cardiovascular;  Laterality: N/A;  . TONSILLECTOMY     as adult  . TOTAL KNEE ARTHROPLASTY  01/24/2011   Procedure: TOTAL KNEE ARTHROPLASTY;  Surgeon: Alta Corning;  Location: [place];  Service: Orthopedics;  Laterality: Left;  COMPUTER ASSISTED LEFT  TOTAL KNEE REPLACEMENT. Anesthesia a combination of regional and general.       Home Medications    Prior to Admission medications   Medication Sig Start Date End Date Taking? Authorizing Provider   allopurinol (ZYLOPRIM) 100 MG tablet Take 2 tablets (200 mg total) by mouth daily. 03/03/16   Susy Frizzle, MD  amLODipine (NORVASC) 10 MG tablet Take 1 tablet (10 mg total) by mouth daily. 03/03/16   Susy Frizzle, MD  apixaban (ELIQUIS) 5 MG TABS tablet Take 1 tablet (5 mg total) by mouth 2 (two) times daily. 03/03/16   Susy Frizzle, MD  aspirin 81 MG tablet Take 81 mg by mouth daily. Reported on 04/05/2015    Historical Provider, MD  atorvastatin (LIPITOR) 40 MG tablet Take 1 tablet (40 mg total) by mouth daily. 03/03/16   Susy Frizzle, MD  carvedilol (COREG) 12.5 MG tablet Take 1 tablet (12.5 mg total) by mouth 2 (two) times daily. 03/03/16   Susy Frizzle, MD  Cholecalciferol 1000 UNITS TBDP Take 1 tablet by mouth daily.     Historical Provider, MD  doxazosin (CARDURA) 4 MG tablet Take 1 tablet (4 mg total) by mouth daily. 03/03/16   Susy Frizzle, MD  Insulin Glargine (BASAGLAR KWIKPEN) 100 UNIT/ML SOPN Inject 0.45 mLs (45 Units total) into the skin at bedtime. 03/14/16   Susy Frizzle, MD  losartan-hydrochlorothiazide (HYZAAR) 100-25 MG tablet Take 1 tablet by mouth daily. 03/03/16   Susy Frizzle, MD  losartan-hydrochlorothiazide (HYZAAR) 100-25 MG tablet TAKE ONE TABLET BY MOUTH ONCE DAILY 03/10/16   Susy Frizzle, MD  magnesium oxide (MAG-OX) 400 MG tablet Take 1 tablet (400 mg total) by mouth daily. 03/03/16   Susy Frizzle, MD  metFORMIN (GLUCOPHAGE) 500 MG tablet TAKE TWO TABLETS BY MOUTH TWICE DAILY WITH  A  MEAL 03/14/16   Susy Frizzle, MD    Family History Family History  Problem Relation Age of Onset  . Leukemia Mother   . Cancer Mother   . Heart Problems Father   . Diabetes Sister     ? father  . Heart Problems Brother   . Heart Problems      family history  . Stroke    . Stroke Brother   . Hypertension Brother   . Cervical cancer Sister     ????  . Heart attack Neg Hx     Social History Social History  Substance Use Topics  . Smoking status:  Never Smoker  . Smokeless tobacco: Never Used  . Alcohol use No     Allergies   Betadine [povidone iodine]; Contrast media [iodinated diagnostic agents]; and Fish allergy   Review of Systems Review of Systems  Constitutional: Negative.  Negative for fever.  Gastrointestinal: Positive for anal bleeding, blood in stool and hematochezia. Negative for constipation, diarrhea, nausea, rectal pain and vomiting.  Neurological: Negative for dizziness.     Physical Exam Triage Vital Signs ED Triage Vitals [03/28/16 2005]  Enc Vitals Group     BP 161/91     Pulse Rate 94  Resp 18     Temp 98.2 F (36.8 C)     Temp src      SpO2 98 %     Weight      Height      Head Circumference      Peak Flow      Pain Score      Pain Loc      Pain Edu?      Excl. in [place]?    No data found.   Updated Vital Signs BP 161/91   Pulse 94   Temp 98.2 F (36.8 C)   Resp 18   SpO2 98%   Visual Acuity Right Eye Distance:   Left Eye Distance:   Bilateral Distance:    Right Eye Near:   Left Eye Near:    Bilateral Near:     Physical Exam  Constitutional: He appears well-developed and well-nourished.  Genitourinary: Rectum normal and prostate normal. Rectal exam shows no external hemorrhoid, no internal hemorrhoid, no fissure, no mass, no tenderness and guaiac negative stool.  Nursing note and vitals reviewed.    UC Treatments / Results  Labs (all labs ordered are listed, but only abnormal results are displayed) Labs Reviewed - No data to display  EKG  EKG Interpretation None       Radiology No results found.  Procedures Procedures (including critical care time)  Medications Ordered in UC Medications - No data to display   Initial Impression / Assessment and Plan / UC Course  I have reviewed the triage vital signs and the nursing notes.  Pertinent labs & imaging results that were available during my care of the patient were reviewed by me and considered in my  medical decision making (see chart for details).     Sent for pt reassurance about source of BRBPR.  Final Clinical Impressions(s) / UC Diagnoses   Final diagnoses:  Rectal bleeding    New Prescriptions New Prescriptions   No medications on file     Billy Fischer, MD 03/28/16 2114

## 2016-03-28 NOTE — ED Triage Notes (Signed)
Pt here for blood in stool for a few days and today bright red. Denies constipation, rectal pain, abd pain.

## 2016-04-02 ENCOUNTER — Other Ambulatory Visit: Payer: Self-pay | Admitting: Internal Medicine

## 2016-04-02 LAB — CUP PACEART REMOTE DEVICE CHECK
Date Time Interrogation Session: 20171221191106
Implantable Pulse Generator Implant Date: 20160629

## 2016-04-03 ENCOUNTER — Telehealth: Payer: Self-pay | Admitting: Oncology

## 2016-04-03 ENCOUNTER — Other Ambulatory Visit (HOSPITAL_BASED_OUTPATIENT_CLINIC_OR_DEPARTMENT_OTHER): Payer: PPO

## 2016-04-03 ENCOUNTER — Ambulatory Visit (HOSPITAL_BASED_OUTPATIENT_CLINIC_OR_DEPARTMENT_OTHER): Payer: PPO | Admitting: Oncology

## 2016-04-03 DIAGNOSIS — C61 Malignant neoplasm of prostate: Secondary | ICD-10-CM

## 2016-04-03 DIAGNOSIS — D751 Secondary polycythemia: Secondary | ICD-10-CM

## 2016-04-03 LAB — CBC WITH DIFFERENTIAL/PLATELET
BASO%: 0.5 % (ref 0.0–2.0)
Basophils Absolute: 0 10*3/uL (ref 0.0–0.1)
EOS%: 4.8 % (ref 0.0–7.0)
Eosinophils Absolute: 0.4 10*3/uL (ref 0.0–0.5)
HCT: 52.4 % — ABNORMAL HIGH (ref 38.4–49.9)
HGB: 16.3 g/dL (ref 13.0–17.1)
LYMPH%: 14.8 % (ref 14.0–49.0)
MCH: 26 pg — ABNORMAL LOW (ref 27.2–33.4)
MCHC: 31.1 g/dL — ABNORMAL LOW (ref 32.0–36.0)
MCV: 83.5 fL (ref 79.3–98.0)
MONO#: 0.7 10*3/uL (ref 0.1–0.9)
MONO%: 8.4 % (ref 0.0–14.0)
NEUT#: 5.6 10*3/uL (ref 1.5–6.5)
NEUT%: 71.5 % (ref 39.0–75.0)
Platelets: 238 10*3/uL (ref 140–400)
RBC: 6.27 10*6/uL — ABNORMAL HIGH (ref 4.20–5.82)
RDW: 20 % — ABNORMAL HIGH (ref 11.0–14.6)
WBC: 7.9 10*3/uL (ref 4.0–10.3)
lymph#: 1.2 10*3/uL (ref 0.9–3.3)

## 2016-04-03 NOTE — Progress Notes (Signed)
Hematology and Oncology Follow Up Visit  MCKENZIE NUDD [id number] [birth year] 71 y.o. 04/03/2016 10:08 AM [name],[name], MDPickard, [name], MD   Principle Diagnosis: 71 year old gentleman with polycythemia diagnosed in July 2015 after presenting with a hemoglobin of 20 and a JAK 2 positive mutation detected on February 2016. 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  Secondary diagnosis:  He was diagnosed with prostate cancer and July 2015. He had a Gleason score of 3+4 = 7 and his PSA was up to 8.4. He is status post radiation therapy completed in March 2017. He was also found to have paroxysmal atrial fibrillation.   Prior Therapy: He received intermittent phlebotomies between March and July 2016 to keep his hematocrit less than 50.  Current therapy:  He continues to be on aspirin and full dose anticoagulation with Eliquis.   Interim History: Mr. Gales presents today for a follow-up visit. Since the last visit, he was seen in the emergency department on 03/28/2016 because of rectal bleeding. His bleeding has stopped at this time and has been evaluated in the past for this issue. He did have a colonoscopy in December 2017 which was unrevealing. Since his visit to the emergency department, he has been feeling well without any recent complaints. He denied any further hematochezia or melena. His appetite is reasonable and does not report any other symptoms.   He denied any chest pain or palpitation denied any fatigue or tiredness. He denied any thrombosis episodes at this time. He had any constitutional symptoms. He denied any joint discomfort. His quality of life remains excellent.   He does report occasional headaches but no blurry vision. He did not have any syncope or seizures. He does not report any chest pain, palpitation, orthopnea. He does not report any cough, hemoptysis, wheezing or shortness of breath. He  does not report any nausea, vomiting, abdominal pain, malaise satiety. He does not report any constipation, diarrhea, hematochezia or melena. He does not report any frequency urgency or hesitancy. He does not report any skeletal complaints. Rest of his review of systems unremarkable.   Medications: I have reviewed the patient's current medications.  Current Outpatient Prescriptions  Medication Sig Dispense Refill  . allopurinol (ZYLOPRIM) 100 MG tablet Take 2 tablets (200 mg total) by mouth daily. 180 tablet 1  . amLODipine (NORVASC) 10 MG tablet Take 1 tablet (10 mg total) by mouth daily. 90 tablet 1  . apixaban (ELIQUIS) 5 MG TABS tablet Take 1 tablet (5 mg total) by mouth 2 (two) times daily. 180 tablet 3  . aspirin 81 MG tablet Take 81 mg by mouth daily. Reported on 04/05/2015    . atorvastatin (LIPITOR) 40 MG tablet Take 1 tablet (40 mg total) by mouth daily. 90 tablet 1  . carvedilol (COREG) 12.5 MG tablet Take 1 tablet (12.5 mg total) by mouth 2 (two) times daily. 180 tablet 3  . Cholecalciferol 1000 UNITS TBDP Take 1 tablet by mouth daily.     [name] doxazosin (CARDURA) 4 MG tablet Take 1 tablet (4 mg total) by mouth daily. 90 tablet 3  . Insulin Glargine (BASAGLAR KWIKPEN) 100 UNIT/ML SOPN Inject 0.45 mLs (45 Units total) into the skin at bedtime. 45 mL 1  . losartan-hydrochlorothiazide (HYZAAR) 100-25 MG tablet Take 1 tablet by mouth daily. 90 tablet 3  . losartan-hydrochlorothiazide (HYZAAR) 100-25 MG tablet TAKE ONE TABLET BY MOUTH ONCE DAILY 90 tablet 1  . magnesium  oxide (MAG-OX) 400 MG tablet Take 1 tablet (400 mg total) by mouth daily. 90 tablet 2  . metFORMIN (GLUCOPHAGE) 500 MG tablet TAKE TWO TABLETS BY MOUTH TWICE DAILY WITH  A  MEAL 360 tablet 3   No current facility-administered medications for this visit.      Allergies:  Allergies  Allergen Reactions  . Betadine [Povidone Iodine] Other (See Comments)    Patient doesn't remember  . Contrast Media [Iodinated Diagnostic  Agents] Other (See Comments)    Patient doesn't remember  . Fish Allergy Swelling    Past Medical History, Surgical history, Social history, and Family History were reviewed and updated.   Physical Exam: Status post reviewed today and his blood pressure is 153/82. Pulse 78, temperature is 90.8 and respirations 16. He weight 248 pounds. He was saturating 100% on room air. ECOG: 0 General appearance: Alert, awake gentleman without distress. Head: Normocephalic, without obvious abnormality no oral thrush noted. Neck: no adenopathy Lymph nodes: Cervical, supraclavicular, and axillary nodes normal. Heart:regular rate and rhythm, S1, S2 normal, no murmur, click, rub or gallop Lung:chest clear, no wheezing, rales, normal symmetric air entry Abdomin: soft, non-tender, without masses or organomegaly no shifting dullness or ascites. EXT:no erythema, induration, or nodules Neurological examination: No motor sensory deficits.  Lab Results: Lab Results  Component Value Date   WBC 7.9 04/03/2016   HGB 16.3 04/03/2016   HCT 52.4 (H) 04/03/2016   MCV 83.5 04/03/2016   PLT 238 04/03/2016     Chemistry      Component Value Date/Time   NA 139 03/28/2016 2142   NA 141 10/02/2015 0956   K 4.2 03/28/2016 2142   K 4.3 10/02/2015 0956   CL 100 (L) 03/28/2016 2142   CO2 29 03/28/2016 2142   CO2 24 10/02/2015 0956   BUN 26 (H) 03/28/2016 2142   BUN 27.1 (H) 10/02/2015 0956   CREATININE 1.59 (H) 03/28/2016 2142   CREATININE 1.36 (H) 11/20/2015 0818   CREATININE 1.6 (H) 10/02/2015 0956      Component Value Date/Time   CALCIUM 9.3 03/28/2016 2142   CALCIUM 9.6 10/02/2015 0956   ALKPHOS 66 03/28/2016 2142   ALKPHOS 83 10/02/2015 0956   AST 18 03/28/2016 2142   AST 11 10/02/2015 0956   ALT 14 (L) 03/28/2016 2142   ALT 13 10/02/2015 0956   BILITOT 0.9 03/28/2016 2142   BILITOT 0.84 10/02/2015 0956         Impression and Plan:  71 year old gentleman with the following issues:  1.  Polycythemia: Due to polycythemia vera especially in the setting of a positive JAK2 mutation with persistently elevated hemoglobin. He did have an acute thrombotic event although it is unclear to me whether this event was precipitated by his hypertension and hyperlipidemia among other risk factors.   He is currently on aspirin and Eliquis for a cardiovascular standpoint. His hemoglobin is close to target range at this time and we will not proceed with phlebotomy at this time given his intermittent hematochezia. This will be considered in the future to control his counts if needed to.  2. Prostate cancer: He is status post definitive therapy with radiation completed without incident. Follow-up with urology as anticipated.  3. Follow-up: Will be in 6 months for a clinical visit.  [facility], MD 2/8/201810:08 AM

## 2016-04-03 NOTE — Telephone Encounter (Signed)
Appointments scheduled per 04/03/16 los. Patient was given a copy of the AVS report and appointment schedule per 04/03/16 los. °

## 2016-04-04 LAB — ERYTHROPOIETIN: Erythropoietin: 2.7 m[IU]/mL (ref 2.6–18.5)

## 2016-04-14 ENCOUNTER — Ambulatory Visit (INDEPENDENT_AMBULATORY_CARE_PROVIDER_SITE_OTHER): Payer: PPO | Admitting: *Deleted

## 2016-04-14 DIAGNOSIS — I638 Other cerebral infarction: Secondary | ICD-10-CM

## 2016-04-14 DIAGNOSIS — I6389 Other cerebral infarction: Secondary | ICD-10-CM

## 2016-04-15 NOTE — Progress Notes (Signed)
Carelink Summary Report / Loop Recorder 

## 2016-04-17 ENCOUNTER — Ambulatory Visit (INDEPENDENT_AMBULATORY_CARE_PROVIDER_SITE_OTHER): Payer: Medicare HMO | Admitting: Family Medicine

## 2016-04-17 ENCOUNTER — Encounter: Payer: Self-pay | Admitting: Family Medicine

## 2016-04-17 VITALS — BP 136/70 | HR 76 | Temp 98.6°F | Resp 16 | Ht 70.0 in | Wt 247.0 lb

## 2016-04-17 DIAGNOSIS — Z794 Long term (current) use of insulin: Secondary | ICD-10-CM

## 2016-04-17 DIAGNOSIS — E119 Type 2 diabetes mellitus without complications: Secondary | ICD-10-CM

## 2016-04-17 DIAGNOSIS — Z23 Encounter for immunization: Secondary | ICD-10-CM

## 2016-04-17 DIAGNOSIS — IMO0001 Reserved for inherently not codable concepts without codable children: Secondary | ICD-10-CM

## 2016-04-17 DIAGNOSIS — I10 Essential (primary) hypertension: Secondary | ICD-10-CM | POA: Diagnosis not present

## 2016-04-17 DIAGNOSIS — T148XXA Other injury of unspecified body region, initial encounter: Secondary | ICD-10-CM

## 2016-04-17 LAB — COMPLETE METABOLIC PANEL WITH GFR
ALT: 12 U/L (ref 9–46)
AST: 15 U/L (ref 10–35)
Albumin: 3.8 g/dL (ref 3.6–5.1)
Alkaline Phosphatase: 67 U/L (ref 40–115)
BUN: 18 mg/dL (ref 7–25)
CO2: 29 mmol/L (ref 20–31)
Calcium: 8.9 mg/dL (ref 8.6–10.3)
Chloride: 101 mmol/L (ref 98–110)
Creat: 1.36 mg/dL — ABNORMAL HIGH (ref 0.70–1.25)
GFR, Est African American: 61 mL/min (ref >=60)
GFR, Est Non African American: 53 mL/min — ABNORMAL LOW (ref >=60)
Glucose, Bld: 213 mg/dL — ABNORMAL HIGH (ref 70–99)
Potassium: 4.3 mmol/L (ref 3.5–5.3)
Sodium: 141 mmol/L (ref 135–146)
Total Bilirubin: 0.9 mg/dL (ref 0.2–1.2)
Total Protein: 7 g/dL (ref 6.1–8.1)

## 2016-04-17 MED ORDER — CEPHALEXIN 500 MG PO CAPS: 500.0000 mg | ORAL_CAPSULE | Freq: Three times a day (TID) | ORAL | 0 refills | Status: DC

## 2016-04-17 NOTE — Progress Notes (Signed)
Subjective:    Patient ID: James Mcclain, male    DOB: [birth year], 71 y.o.   MRN: [id number]  HPI The patient has not been seen in a while.  Yesterday, stepped on a nail in his right foot barefoot.  Sustained puncture wound in plantar aspect of midfoot.  Dirty nail.  Tetanus status is uncertain.  Patient is overdue for hemoglobin A1c, and a urine microalbumin. His blood pressure today is well controlled. He states that his blood sugars are typically between 101 150. He occasionally sees 180. He denies any hypoglycemia. He is also due for urine microalbumin. Examination of the plantar aspect of his right foot reveals no cellulitis. There is no warmth. There is some swelling near the puncture site. Past Medical History:  Diagnosis Date  . Arthritis   . Coronary artery disease    a. s/p Xience DES to the LAD 07/2012.  . Dilated aortic root ([place])    a. Mildly dilated by echo 09/2013, f/u MRA scheduled for 12/2014.  [name] Elevated PSA   . Essential hypertension   . History of kidney stones   . Hx of gout   . Hyperlipidemia   . Insulin dependent diabetes mellitus ([place])   . 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 On tele 09/2013.  [name] PFO (patent foramen ovale)    a. By echo 09/2013.  [name] Polycythemia   . Prostate cancer ([place]) 07/15/12   Gleason 7, external beam radiation Tx, Dr. Pilar Jarvis  . PVC's (premature ventricular contractions)   . Stroke [facility])    a. Cryptogenic, 09/2013, event monitor with NSR. Small PFO noted on echo but neg LE duplex.   Past Surgical History:  Procedure Laterality Date  . COLONOSCOPY WITH PROPOFOL N/A 02/22/2016   Procedure: COLONOSCOPY WITH PROPOFOL;  Surgeon: Carol Ada, MD;  Location: [facility];  Service: Endoscopy;  Laterality: N/A;  . CORONARY ANGIOPLASTY WITH STENT PLACEMENT  08/05/2012   LAD, 1 stent  . EP IMPLANTABLE DEVICE N/A 08/23/2014   Procedure: Loop Recorder Insertion;  Surgeon: Evans Lance, MD;  Location: [place] CV LAB;  Service: Cardiovascular;   Laterality: N/A;  . KIDNEY STONES     2006 - LITHOTRIPSY  . PERCUTANEOUS CORONARY STENT INTERVENTION (PCI-S) N/A 08/05/2012   Procedure: PERCUTANEOUS CORONARY STENT INTERVENTION (PCI-S);  Surgeon: Sherren Mocha, MD;  Location: [facility] CATH LAB;  Service: Cardiovascular;  Laterality: N/A;  . PROSTATE BIOPSY  07/15/2012  . PROSTATE BIOPSY  09/23/2013  . TEE WITHOUT CARDIOVERSION N/A 09/27/2013   Procedure: TRANSESOPHAGEAL ECHOCARDIOGRAM (TEE);  Surgeon: Larey Dresser, MD;  Location: [place];  Service: Cardiovascular;  Laterality: N/A;  . TONSILLECTOMY     as adult  . TOTAL KNEE ARTHROPLASTY  01/24/2011   Procedure: TOTAL KNEE ARTHROPLASTY;  Surgeon: Alta Corning;  Location: [place];  Service: Orthopedics;  Laterality: Left;  COMPUTER ASSISTED LEFT  TOTAL KNEE REPLACEMENT. Anesthesia a combination of regional and general.   Current Outpatient Prescriptions on File Prior to Visit  Medication Sig Dispense Refill  . allopurinol (ZYLOPRIM) 100 MG tablet Take 2 tablets (200 mg total) by mouth daily. 180 tablet 1  . amLODipine (NORVASC) 10 MG tablet Take 1 tablet (10 mg total) by mouth daily. 90 tablet 1  . apixaban (ELIQUIS) 5 MG TABS tablet Take 1 tablet (5 mg total) by mouth 2 (two) times daily. 180 tablet 3  . aspirin 81 MG tablet Take 81 mg by mouth daily. Reported on 04/05/2015    .  atorvastatin (LIPITOR) 40 MG tablet Take 1 tablet (40 mg total) by mouth daily. 90 tablet 1  . carvedilol (COREG) 12.5 MG tablet Take 1 tablet (12.5 mg total) by mouth 2 (two) times daily. 180 tablet 3  . Cholecalciferol 1000 UNITS TBDP Take 1 tablet by mouth daily.     [name] doxazosin (CARDURA) 4 MG tablet Take 1 tablet (4 mg total) by mouth daily. 90 tablet 3  . Insulin Glargine (BASAGLAR KWIKPEN) 100 UNIT/ML SOPN Inject 0.45 mLs (45 Units total) into the skin at bedtime. 45 mL 1  . losartan-hydrochlorothiazide (HYZAAR) 100-25 MG tablet Take 1 tablet by mouth daily. 90 tablet 3  . losartan-hydrochlorothiazide (HYZAAR)  100-25 MG tablet TAKE ONE TABLET BY MOUTH ONCE DAILY 90 tablet 1  . magnesium oxide (MAG-OX) 400 MG tablet Take 1 tablet (400 mg total) by mouth daily. 90 tablet 2  . metFORMIN (GLUCOPHAGE) 500 MG tablet TAKE TWO TABLETS BY MOUTH TWICE DAILY WITH  A  MEAL 360 tablet 3   No current facility-administered medications on file prior to visit.    Allergies  Allergen Reactions  . Betadine [Povidone Iodine] Other (See Comments)    Patient doesn't remember  . Contrast Media [Iodinated Diagnostic Agents] Other (See Comments)    Patient doesn't remember  . Fish Allergy Swelling   Social History   Social History  . Marital status: Single    Spouse name: N/A  . Number of children: 3  . Years of education: 12th   Occupational History  . retired    Social History Main Topics  . Smoking status: Never Smoker  . Smokeless tobacco: Never Used  . Alcohol use No  . Drug use: No  . Sexual activity: Not Currently   Other Topics Concern  . Not on file   Social History Narrative   [name]   Patient is right handed   Patient drinks coffee and sodas     Review of Systems  All other systems reviewed and are negative.      Objective:   Physical Exam  Constitutional: He appears well-developed and well-nourished. No distress.  Cardiovascular: Normal rate, regular rhythm and normal heart sounds.   No murmur heard. Pulmonary/Chest: Effort normal and breath sounds normal. No respiratory distress. He has no wheezes. He has no rales. He exhibits no tenderness.  Abdominal: Soft. Bowel sounds are normal.  Skin: No rash noted. He is not diaphoretic. No erythema.  Vitals reviewed.         Assessment & Plan:  Insulin dependent diabetes mellitus ([place]) - Plan: COMPLETE METABOLIC PANEL WITH GFR, Hemoglobin A1c, Microalbumin, urine  Puncture wound  Benign essential HTN  Blood pressure today is well controlled. I will check a hemoglobin A1c. The patient's only  complaint is diarrhea. I recommended discontinuing magnesium. If diarrhea persists, we can switch metformin to an alternative. I will check a urine microalbumin along with a CMP. Goal hemoglobin A1c is less than 7. Diabetic foot exam today is performed. Patient's tetanus shot is updated. Given the deep puncture and the dirty nail, I will start the patient on Keflex 500 mg by mouth 3 times a day for 7 days

## 2016-04-17 NOTE — Addendum Note (Signed)
Addended by: Shary Decamp B on: 04/17/2016 11:08 AM   Modules accepted: Orders

## 2016-04-18 ENCOUNTER — Encounter: Payer: Self-pay | Admitting: Family Medicine

## 2016-04-18 LAB — MICROALBUMIN, URINE: Microalb, Ur: 25.4 mg/dL

## 2016-04-18 LAB — HEMOGLOBIN A1C
Hgb A1c MFr Bld: 7.3 % — ABNORMAL HIGH (ref <5.7)
Mean Plasma Glucose: 163 mg/dL

## 2016-04-19 LAB — CUP PACEART REMOTE DEVICE CHECK
Date Time Interrogation Session: 20180120193922
Implantable Pulse Generator Implant Date: 20160629

## 2016-04-22 ENCOUNTER — Telehealth: Payer: Self-pay | Admitting: Family Medicine

## 2016-04-22 NOTE — Telephone Encounter (Signed)
Patient asking you to send refills for his insulin and metformin to his mail order pharmacy  [phone]

## 2016-04-23 MED ORDER — BASAGLAR KWIKPEN 100 UNIT/ML ~~LOC~~ SOPN: 45.0000 [IU] | PEN_INJECTOR | Freq: Every day | SUBCUTANEOUS | 1 refills | Status: DC

## 2016-04-23 MED ORDER — METFORMIN HCL 500 MG PO TABS: ORAL_TABLET | ORAL | 3 refills | Status: DC

## 2016-04-23 NOTE — Telephone Encounter (Signed)
Medication called/sent to requested pharmacy  

## 2016-05-06 LAB — CUP PACEART REMOTE DEVICE CHECK
Date Time Interrogation Session: 20180219193658
Implantable Pulse Generator Implant Date: 20160629

## 2016-05-14 ENCOUNTER — Ambulatory Visit (INDEPENDENT_AMBULATORY_CARE_PROVIDER_SITE_OTHER): Payer: PPO | Admitting: *Deleted

## 2016-05-14 DIAGNOSIS — I638 Other cerebral infarction: Secondary | ICD-10-CM

## 2016-05-14 DIAGNOSIS — I6389 Other cerebral infarction: Secondary | ICD-10-CM

## 2016-05-15 NOTE — Progress Notes (Signed)
Carelink Summary Report / Loop Recorder 

## 2016-05-20 ENCOUNTER — Encounter (HOSPITAL_COMMUNITY): Payer: Self-pay

## 2016-05-20 ENCOUNTER — Ambulatory Visit (HOSPITAL_COMMUNITY)
Admission: EM | Admit: 2016-05-20 | Discharge: 2016-05-20 | Disposition: A | Discharge status: Nursing Home (Includes ICF) | Payer: PPO | Source: Home / Self Care

## 2016-05-20 ENCOUNTER — Emergency Department (HOSPITAL_COMMUNITY): Payer: PPO

## 2016-05-20 ENCOUNTER — Emergency Department (HOSPITAL_COMMUNITY)
Admission: EM | Admit: 2016-05-20 | Discharge: 2016-05-20 | Disposition: A | Discharge status: Home / Self Care | Payer: PPO | Attending: Emergency Medicine | Admitting: Emergency Medicine

## 2016-05-20 DIAGNOSIS — Z8673 Personal history of transient ischemic attack (TIA), and cerebral infarction without residual deficits: Secondary | ICD-10-CM | POA: Diagnosis not present

## 2016-05-20 DIAGNOSIS — Z7901 Long term (current) use of anticoagulants: Secondary | ICD-10-CM | POA: Insufficient documentation

## 2016-05-20 DIAGNOSIS — E119 Type 2 diabetes mellitus without complications: Secondary | ICD-10-CM | POA: Insufficient documentation

## 2016-05-20 DIAGNOSIS — Z8546 Personal history of malignant neoplasm of prostate: Secondary | ICD-10-CM | POA: Insufficient documentation

## 2016-05-20 DIAGNOSIS — G43009 Migraine without aura, not intractable, without status migrainosus: Secondary | ICD-10-CM | POA: Diagnosis not present

## 2016-05-20 DIAGNOSIS — Z7984 Long term (current) use of oral hypoglycemic drugs: Secondary | ICD-10-CM | POA: Insufficient documentation

## 2016-05-20 DIAGNOSIS — Z96652 Presence of left artificial knee joint: Secondary | ICD-10-CM | POA: Diagnosis not present

## 2016-05-20 DIAGNOSIS — Z7982 Long term (current) use of aspirin: Secondary | ICD-10-CM | POA: Insufficient documentation

## 2016-05-20 DIAGNOSIS — R51 Headache: Secondary | ICD-10-CM | POA: Diagnosis present

## 2016-05-20 DIAGNOSIS — Z955 Presence of coronary angioplasty implant and graft: Secondary | ICD-10-CM | POA: Diagnosis not present

## 2016-05-20 DIAGNOSIS — I2510 Atherosclerotic heart disease of native coronary artery without angina pectoris: Secondary | ICD-10-CM | POA: Diagnosis not present

## 2016-05-20 DIAGNOSIS — I10 Essential (primary) hypertension: Secondary | ICD-10-CM | POA: Diagnosis not present

## 2016-05-20 DIAGNOSIS — Z79899 Other long term (current) drug therapy: Secondary | ICD-10-CM | POA: Diagnosis not present

## 2016-05-20 LAB — CBC WITH DIFFERENTIAL/PLATELET
Basophils Absolute: 0 10*3/uL (ref 0.0–0.1)
Basophils Relative: 0 %
Eosinophils Absolute: 0.3 10*3/uL (ref 0.0–0.7)
Eosinophils Relative: 3 %
HCT: 51.9 % (ref 39.0–52.0)
Hemoglobin: 15.4 g/dL (ref 13.0–17.0)
Lymphocytes Relative: 13 %
Lymphs Abs: 1.3 10*3/uL (ref 0.7–4.0)
MCH: 25.6 pg — ABNORMAL LOW (ref 26.0–34.0)
MCHC: 29.7 g/dL — ABNORMAL LOW (ref 30.0–36.0)
MCV: 86.4 fL (ref 78.0–100.0)
Monocytes Absolute: 0.7 10*3/uL (ref 0.1–1.0)
Monocytes Relative: 7 %
Neutro Abs: 7.7 10*3/uL (ref 1.7–7.7)
Neutrophils Relative %: 77 %
Platelets: 220 10*3/uL (ref 150–400)
RBC: 6.01 MIL/uL — ABNORMAL HIGH (ref 4.22–5.81)
RDW: 18.9 % — ABNORMAL HIGH (ref 11.5–15.5)
WBC: 10 10*3/uL (ref 4.0–10.5)

## 2016-05-20 LAB — BASIC METABOLIC PANEL
Anion gap: 9 (ref 5–15)
BUN: 16 mg/dL (ref 6–20)
CO2: 27 mmol/L (ref 22–32)
Calcium: 9 mg/dL (ref 8.9–10.3)
Chloride: 105 mmol/L (ref 101–111)
Creatinine, Ser: 1.23 mg/dL (ref 0.61–1.24)
GFR calc Af Amer: >60 mL/min (ref >60)
GFR calc non Af Amer: 58 mL/min — ABNORMAL LOW (ref >60)
Glucose, Bld: 154 mg/dL — ABNORMAL HIGH (ref 65–99)
Potassium: 4 mmol/L (ref 3.5–5.1)
Sodium: 141 mmol/L (ref 135–145)

## 2016-05-20 MED ORDER — METOCLOPRAMIDE HCL 5 MG/ML IJ SOLN
10.0000 mg | Freq: Once | INTRAMUSCULAR | Status: AC
  Administered 2016-05-20: 10 mg via INTRAVENOUS
  Filled 2016-05-20: qty 2

## 2016-05-20 NOTE — ED Notes (Signed)
Screened   By  Dietrich Pates    Send  To  er

## 2016-05-20 NOTE — ED Provider Notes (Signed)
[facility] DEPT Provider Note   CSN: [id number] Arrival date & time: 05/20/16  1336     History   Chief Complaint Chief Complaint  Patient presents with  . Headache    HPI James Mcclain is a 71 y.o. male.  The history is provided by the patient. No language interpreter was used.  Headache      James Mcclain is a 71 y.o. male who presents to the Emergency Department complaining of headache.  He presents for evaluation of right-sided headache that is been present for the last 2 days. Symptoms started yesterday and then resolved only to recur today. Symptoms are waxing and waning in nature with no clear alleviating or worsening symptoms. He does endorse associated photophobia and bright sensation in bilateral eyes. He reports 2 weeks of intermittent pain in his left shoulder that he describes as a muscle spasm type sensation. Denies any fevers, vision changes, numbness, weakness, nausea, vomiting, chest pain, bowel pain. He has a history of migraine headache but none for several years and this is similar to prior migraines.  Past Medical History:  Diagnosis Date  . Arthritis   . Coronary artery disease    a. s/p Xience DES to the LAD 07/2012.  . Dilated aortic root ([place])    a. Mildly dilated by echo 09/2013, f/u MRA scheduled for 12/2014.  James Mcclain Elevated PSA   . Essential hypertension   . History of kidney stones   . Hx of gout   . Hyperlipidemia   . Insulin dependent diabetes mellitus ([place])   . 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 On tele 09/2013.  James Mcclain PFO (patent foramen ovale)    a. By echo 09/2013.  James Mcclain Polycythemia   . Prostate cancer ([place]) 07/15/12   Gleason 7, external beam radiation Tx, Dr. Pilar Jarvis  . PVC's (premature ventricular contractions)   . Stroke [facility])    a. Cryptogenic, 09/2013, event monitor with NSR. Small PFO noted on echo but neg LE duplex.    Patient Active Problem List   Diagnosis Date Noted  . Paroxysmal atrial fibrillation ([place]) 07/30/2015  . PFO  (patent foramen ovale)   . Dilated aortic root ([place])   . PVC's (premature ventricular contractions)   . Insulin dependent diabetes mellitus ([place])   . Stroke ([place])   . Coronary artery disease   . Multifocal atrial tachycardia (HCC)   . Polycythemia 05/16/2014  . History of CVA (cerebrovascular accident) 01/02/2014  . Type 2 diabetes mellitus with other diabetic neurological complication 97/98/9211  . Essential hypertension 11/28/2013  . Hyperlipidemia 11/28/2013  . 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 CAD (coronary artery disease) 12/17/2012  . Prostate cancer ([place])   . 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 Exertional dyspnea 06/23/2012  . Murmur 06/23/2012  . DM2 (diabetes mellitus, type 2) ([place])   . Elevated PSA     Past Surgical History:  Procedure Laterality Date  . COLONOSCOPY WITH PROPOFOL N/A 02/22/2016   Procedure: COLONOSCOPY WITH PROPOFOL;  Surgeon: Carol Ada, MD;  [facility];  Service: Endoscopy;  Laterality: N/A;  . CORONARY ANGIOPLASTY WITH STENT PLACEMENT  08/05/2012   LAD, 1 stent  . EP IMPLANTABLE DEVICE N/A 08/23/2014   Procedure: Loop Recorder Insertion;  Surgeon: Evans Lance, MD;  Location: [place] CV LAB;  Service: Cardiovascular;  Laterality: N/A;  . KIDNEY STONES     2006 - LITHOTRIPSY  .  PERCUTANEOUS CORONARY STENT INTERVENTION (PCI-S) N/A 08/05/2012   Procedure: PERCUTANEOUS CORONARY STENT INTERVENTION (PCI-S);  Surgeon: Sherren Mocha, MD;  Location: [facility] CATH LAB;  Service: Cardiovascular;  Laterality: N/A;  . PROSTATE BIOPSY  07/15/2012  . PROSTATE BIOPSY  09/23/2013  . TEE WITHOUT CARDIOVERSION N/A 09/27/2013   Procedure: TRANSESOPHAGEAL ECHOCARDIOGRAM (TEE);  Surgeon: Larey Dresser, MD;  Location: [place];  Service: Cardiovascular;  Laterality: N/A;  . TONSILLECTOMY     as adult  . TOTAL KNEE ARTHROPLASTY  01/24/2011   Procedure: TOTAL KNEE ARTHROPLASTY;   Surgeon: Alta Corning;  Location: [place];  Service: Orthopedics;  Laterality: Left;  COMPUTER ASSISTED LEFT  TOTAL KNEE REPLACEMENT. Anesthesia a combination of regional and general.       Home Medications    Prior to Admission medications   Medication Sig Start Date End Date Taking? Authorizing Provider  allopurinol (ZYLOPRIM) 100 MG tablet Take 2 tablets (200 mg total) by mouth daily. 03/03/16  Yes Susy Frizzle, MD  amLODipine (NORVASC) 10 MG tablet Take 1 tablet (10 mg total) by mouth daily. 03/03/16  Yes Susy Frizzle, MD  apixaban (ELIQUIS) 5 MG TABS tablet Take 1 tablet (5 mg total) by mouth 2 (two) times daily. 03/03/16  Yes Susy Frizzle, MD  aspirin 81 MG tablet Take 81 mg by mouth daily. Reported on 04/05/2015   Yes Historical Provider, MD  atorvastatin (LIPITOR) 40 MG tablet Take 1 tablet (40 mg total) by mouth daily. 03/03/16  Yes Susy Frizzle, MD  carvedilol (COREG) 12.5 MG tablet Take 1 tablet (12.5 mg total) by mouth 2 (two) times daily. 03/03/16  Yes Susy Frizzle, MD  doxazosin (CARDURA) 4 MG tablet Take 1 tablet (4 mg total) by mouth daily. 03/03/16  Yes Susy Frizzle, MD  Insulin Glargine (BASAGLAR KWIKPEN) 100 UNIT/ML SOPN Inject 0.45 mLs (45 Units total) into the skin at bedtime. 04/23/16  Yes Susy Frizzle, MD  losartan-hydrochlorothiazide (HYZAAR) 100-25 MG tablet Take 1 tablet by mouth daily. 03/03/16  Yes Susy Frizzle, MD  magnesium oxide (MAG-OX) 400 MG tablet Take 1 tablet (400 mg total) by mouth daily. 03/03/16  Yes Susy Frizzle, MD  metFORMIN (GLUCOPHAGE) 500 MG tablet TAKE TWO TABLETS BY MOUTH TWICE DAILY WITH  A  MEAL 04/23/16  Yes Susy Frizzle, MD    Family History Family History  Problem Relation Age of Onset  . Leukemia Mother   . Cancer Mother   . Heart Problems Father   . Diabetes Sister     ? father  . Heart Problems Brother   . Heart Problems      family history  . Stroke    . Stroke Brother   . Hypertension Brother   .  Cervical cancer Sister     ????  . Heart attack Neg Hx     Social History Social History  Substance Use Topics  . Smoking status: Never Smoker  . Smokeless tobacco: Never Used  . Alcohol use No     Allergies   Betadine [povidone iodine]; Contrast media [iodinated diagnostic agents]; and Fish allergy   Review of Systems Review of Systems  Neurological: Positive for headaches.  All other systems reviewed and are negative.    Physical Exam Updated Vital Signs BP (!) 157/97   Pulse 77   Temp 98.4 F (36.9 C) (Oral)   Resp 16   Ht 5\' 10"  (1.778 m)   Wt 240 lb (108.9 kg)  SpO2 100%   BMI 34.44 kg/m   Physical Exam  Constitutional: He is oriented to person, place, and time. He appears well-developed and well-nourished.  HENT:  Head: Normocephalic and atraumatic.  Right Ear: External ear normal.  Left Ear: External ear normal.  Mouth/Throat: Oropharynx is clear and moist.  No facial or temporal tenderness to palpation  Eyes: EOM are normal. Pupils are equal, round, and reactive to light.  Neck: Neck supple.  Cardiovascular: Normal rate and regular rhythm.   No murmur heard. Pulmonary/Chest: Effort normal and breath sounds normal. No respiratory distress.  Abdominal: Soft. There is no tenderness. There is no rebound and no guarding.  Musculoskeletal: He exhibits no edema or tenderness.  Neurological: He is alert and oriented to person, place, and time. No cranial nerve deficit.  5 out of 5 strength in all 4 extremities with sensation to light touch intact in all 4 extremities. No pronator drift.  Skin: Skin is warm and dry.  Psychiatric: He has a normal mood and affect. His behavior is normal.  Nursing note and vitals reviewed.    ED Treatments / Results  Labs (all labs ordered are listed, but only abnormal results are displayed) Labs Reviewed  BASIC METABOLIC PANEL - Abnormal; Notable for the following:       Result Value   Glucose, Bld 154 (*)    GFR calc  non Af Amer 58 (*)    All other components within normal limits  CBC WITH DIFFERENTIAL/PLATELET - Abnormal; Notable for the following:    RBC 6.01 (*)    MCH 25.6 (*)    MCHC 29.7 (*)    RDW 18.9 (*)    All other components within normal limits    EKG  EKG Interpretation None       Radiology Ct Head Wo Contrast  Result Date: 05/20/2016 CLINICAL DATA:  Headache.  No reported injury. EXAM: CT HEAD WITHOUT CONTRAST TECHNIQUE: Contiguous axial images were obtained from the base of the skull through the vertex without intravenous contrast. COMPARISON:  07/13/2015 head CT. FINDINGS: Brain: Stable left parietal encephalomalacia with associated ex vacuo dilatation of the occipital horn of the left lateral ventricle. No evidence of parenchymal hemorrhage or extra-axial fluid collection. No mass lesion, mass effect, or midline shift. No CT evidence of acute infarction. Ventricles are stable with no acute ventriculomegaly. Vascular: No hyperdense vessel or unexpected calcification. Skull: No evidence of calvarial fracture. Sinuses/Orbits: The visualized paranasal sinuses are essentially clear. Other:  Stable chronic bilateral mastoid effusions. IMPRESSION: 1.  No evidence of acute intracranial abnormality. 2. Stable left parietal encephalomalacia. 3. Stable chronic bilateral mastoid effusions. Electronically Signed   By: Ilona Sorrel M.D.   On: 05/20/2016 18:57    Procedures Procedures (including critical care time)  Medications Ordered in ED Medications  metoCLOPramide (REGLAN) injection 10 mg (10 mg Intravenous Given 05/20/16 1850)     Initial Impression / Assessment and Plan / ED Course  I have reviewed the triage vital signs and the nursing notes.  Pertinent labs & imaging results that were available during my care of the patient were reviewed by me and considered in my medical decision making (see chart for details).     Patient here for evaluation of headache. He is neurologically  intact on examination. Headache resolved after treatment in the emergency department. Current clinical picture is not consistent with subarachnoid hemorrhage, CVA, dural sinus thrombosis, temporal arteritis. Counseled patient on home care for headache with importance of PCP follow-up  as well as return precautions.   Final Clinical Impressions(s) / ED Diagnoses   Final diagnoses:  Migraine without aura and without status migrainosus, not intractable    New Prescriptions New Prescriptions   No medications on file     Quintella Reichert, MD 05/20/16 2050

## 2016-05-20 NOTE — ED Triage Notes (Signed)
Pt presents with 2-3 day h/o R temporal headache.  -nausea, +photophobia;  Pt also reports 2 week h/o pain that begins to L side of neck and radiates into L shoulder.  Pt denies any injury, reports symptoms are intermittent.

## 2016-05-26 LAB — CUP PACEART REMOTE DEVICE CHECK
Date Time Interrogation Session: 20180321200622
Implantable Pulse Generator Implant Date: 20160629

## 2016-05-27 ENCOUNTER — Ambulatory Visit
Admission: RE | Admit: 2016-05-27 | Discharge: 2016-05-27 | Disposition: A | Discharge status: Home / Self Care | Payer: PPO | Source: Ambulatory Visit | Attending: Family Medicine | Admitting: Family Medicine

## 2016-05-27 ENCOUNTER — Encounter: Payer: Self-pay | Admitting: Family Medicine

## 2016-05-27 ENCOUNTER — Ambulatory Visit (INDEPENDENT_AMBULATORY_CARE_PROVIDER_SITE_OTHER): Payer: PPO | Admitting: Family Medicine

## 2016-05-27 VITALS — BP 140/76 | HR 76 | Temp 98.3°F | Resp 16 | Wt 248.0 lb

## 2016-05-27 DIAGNOSIS — M542 Cervicalgia: Secondary | ICD-10-CM | POA: Diagnosis not present

## 2016-05-27 DIAGNOSIS — M5412 Radiculopathy, cervical region: Secondary | ICD-10-CM | POA: Diagnosis not present

## 2016-05-27 NOTE — Progress Notes (Signed)
Subjective:    Patient ID: James Mcclain, male    DOB: 21-Nov-1945, 71 y.o.   MRN: [id number]  HPI The patient has a past medical history of migraines. He has not had a migraine for 3 or 4 years. Recently he had go the emergency room with a right-sided headache that lasted 38 hours. The headache was located on the right temple. It was pulsatile in nature. It was associated with photophobia. It was preceded by an oral. It was similar to his previous migraines. He was given Reglan in the emergency room and the headache subsided. He has not had a headache since. His biggest concern now is a burning neuropathic type pain radiating from the left side of his neck into his left shoulder and down his left arm into his left tricep. The pain comes and goes. He has no pain with range of motion in the shoulder. He has negative empty can sign, has a negative Hawkins maneuver. He has normal range of motion in the shoulder with very minimal crepitus. He has a negative speed's test. He has negative Yergason's test. He does have a positive Spurling's test Past Medical History:  Diagnosis Date  . Arthritis   . Coronary artery disease    a. s/p Xience DES to the LAD 07/2012.  . Dilated aortic root ([place])    a. Mildly dilated by echo 09/2013, f/u MRA scheduled for 12/2014.  [name] Elevated PSA   . Essential hypertension   . History of kidney stones   . Hx of gout   . Hyperlipidemia   . Insulin dependent diabetes mellitus ([place])   . 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 On tele 09/2013.  [name] PFO (patent foramen ovale)    a. By echo 09/2013.  [name] Polycythemia   . Prostate cancer ([place]) 07/15/12   Gleason 7, external beam radiation Tx, Dr. Pilar Jarvis  . PVC's (premature ventricular contractions)   . Stroke [facility])    a. Cryptogenic, 09/2013, event monitor with NSR. Small PFO noted on echo but neg LE duplex.   Past Surgical History:  Procedure Laterality Date  . COLONOSCOPY WITH PROPOFOL N/A 02/22/2016   Procedure: COLONOSCOPY  WITH PROPOFOL;  Surgeon: Carol Ada, MD;  Location: [facility];  Service: Endoscopy;  Laterality: N/A;  . CORONARY ANGIOPLASTY WITH STENT PLACEMENT  08/05/2012   LAD, 1 stent  . EP IMPLANTABLE DEVICE N/A 08/23/2014   Procedure: Loop Recorder Insertion;  Surgeon: Evans Lance, MD;  Location: [place] CV LAB;  Service: Cardiovascular;  Laterality: N/A;  . KIDNEY STONES     2006 - LITHOTRIPSY  . PERCUTANEOUS CORONARY STENT INTERVENTION (PCI-S) N/A 08/05/2012   Procedure: PERCUTANEOUS CORONARY STENT INTERVENTION (PCI-S);  Surgeon: Sherren Mocha, MD;  Location: [facility] CATH LAB;  Service: Cardiovascular;  Laterality: N/A;  . PROSTATE BIOPSY  07/15/2012  . PROSTATE BIOPSY  09/23/2013  . TEE WITHOUT CARDIOVERSION N/A 09/27/2013   Procedure: TRANSESOPHAGEAL ECHOCARDIOGRAM (TEE);  Surgeon: Larey Dresser, MD;  Location: [place];  Service: Cardiovascular;  Laterality: N/A;  . TONSILLECTOMY     as adult  . TOTAL KNEE ARTHROPLASTY  01/24/2011   Procedure: TOTAL KNEE ARTHROPLASTY;  Surgeon: Alta Corning;  Location: [place];  Service: Orthopedics;  Laterality: Left;  COMPUTER ASSISTED LEFT  TOTAL KNEE REPLACEMENT. Anesthesia a combination of regional and general.   Current Outpatient Prescriptions on File Prior to Visit  Medication Sig Dispense Refill  . allopurinol (ZYLOPRIM) 100 MG tablet Take 2 tablets (  200 mg total) by mouth daily. 180 tablet 1  . amLODipine (NORVASC) 10 MG tablet Take 1 tablet (10 mg total) by mouth daily. 90 tablet 1  . apixaban (ELIQUIS) 5 MG TABS tablet Take 1 tablet (5 mg total) by mouth 2 (two) times daily. 180 tablet 3  . aspirin 81 MG tablet Take 81 mg by mouth daily. Reported on 04/05/2015    . atorvastatin (LIPITOR) 40 MG tablet Take 1 tablet (40 mg total) by mouth daily. 90 tablet 1  . carvedilol (COREG) 12.5 MG tablet Take 1 tablet (12.5 mg total) by mouth 2 (two) times daily. 180 tablet 3  . doxazosin (CARDURA) 4 MG tablet Take 1 tablet (4 mg total) by mouth daily. 90  tablet 3  . Insulin Glargine (BASAGLAR KWIKPEN) 100 UNIT/ML SOPN Inject 0.45 mLs (45 Units total) into the skin at bedtime. 45 mL 1  . losartan-hydrochlorothiazide (HYZAAR) 100-25 MG tablet Take 1 tablet by mouth daily. 90 tablet 3  . magnesium oxide (MAG-OX) 400 MG tablet Take 1 tablet (400 mg total) by mouth daily. 90 tablet 2  . metFORMIN (GLUCOPHAGE) 500 MG tablet TAKE TWO TABLETS BY MOUTH TWICE DAILY WITH  A  MEAL 360 tablet 3   No current facility-administered medications on file prior to visit.    Allergies  Allergen Reactions  . Betadine [Povidone Iodine] Other (See Comments)    Patient doesn't remember  . Contrast Media [Iodinated Diagnostic Agents] Other (See Comments)    Patient doesn't remember  . Fish Allergy Swelling   Social History   Social History  . Marital status: Single    Spouse name: N/A  . Number of children: 3  . Years of education: 12th   Occupational History  . retired    Social History Main Topics  . Smoking status: Never Smoker  . Smokeless tobacco: Never Used  . Alcohol use No  . Drug use: No  . Sexual activity: Not Currently   Other Topics Concern  . Not on file   Social History Narrative   [name]   Patient is right handed   Patient drinks coffee and sodas      Review of Systems  All other systems reviewed and are negative.      Objective:   Physical Exam  Constitutional: He appears well-developed and well-nourished.  Eyes: Conjunctivae and EOM are normal. Pupils are equal, round, and reactive to light.  Neck: Normal range of motion. Neck supple.  Cardiovascular: Normal rate, regular rhythm and normal heart sounds.   Pulmonary/Chest: Effort normal and breath sounds normal.  Musculoskeletal:       Left shoulder: He exhibits normal range of motion, no tenderness, no bony tenderness, no effusion, no crepitus, no pain, no spasm, normal pulse and normal strength.       Cervical back: He exhibits normal  range of motion, no tenderness, no bony tenderness, no deformity, no pain and no spasm.  Vitals reviewed.         Assessment & Plan:  Cervical radiculopathy - Plan: DG Cervical Spine Complete  History of the headache is consistent with a migraine and the patient is now symptom-free area and no further workup at this time. The atypical burning pain radiating from the left side of his neck into his left shoulder is consistent with cervical radiculopathy. I can reproduce the pain and exacerbate the pain with Spurling's maneuver. Therefore I'll send the patient for a C-spine x-ray. If x-ray  reveals degenerative disc disease in the neck and no other abnormality, I would recommend trying the patient on a prednisone taper pack for cervical radiculopathy

## 2016-05-30 ENCOUNTER — Telehealth: Payer: Self-pay | Admitting: Family Medicine

## 2016-05-30 NOTE — Telephone Encounter (Signed)
LMTCB

## 2016-05-30 NOTE — Telephone Encounter (Signed)
DG Cervical Spine Complete (Accession [id number]) (Order [id number])  Imaging  Date: 05/27/2016 Department: [facility] Released By: [name] Authorizing: Susy Frizzle, MD  Exam Information   Status Exam Begun  Exam Ended   Final [99] 05/27/2016 11:56 AM 05/27/2016 12:05 PM  PACS Images   Show images for DG Cervical Spine Complete  Study Result   CLINICAL DATA:  Neck pain for 6 months radiating to LEFT shoulder, cervical radiculopathy, no known injury  EXAM: CERVICAL SPINE - COMPLETE 4+ VIEW  COMPARISON:  08/20/2006  FINDINGS: Prevertebral soft tissues normal thickness.  Osseous mineralization normal.  Multilevel disc space narrowing and endplate spur formation cervical spine progressive since 2008.  No acute fracture, subluxation, or bone destruction.  Mild encroachment upon multiple BILATERAL cervical neural foramina by uncovertebral spurs greater at LEFT C5-C6 and C6-C7.  Lung apices clear.  C1-C2 alignment normal.  IMPRESSION: Progressive degenerative disc disease changes since 2008 with mild cervical neural foraminal encroachment at LEFT C5-C6 and C6-C7 by uncovertebral spurs.   Electronically Signed   By: Lavonia Dana M.D.   On: 05/27/2016 13:27   Result Notes   Notes recorded by Susy Frizzle, MD on 05/29/2016 at 6:51 AM EDT That is what we expected. Try the prednisone I gave him at the [facility] and recheck with me if no better in 2 weeks. ------  Notes recorded by Olena Mater, LPN on 08/27/1285 at 86:76 AM EDT What do want me to tell pt?? What next step?? ------  Notes recorded by Susy Frizzle, MD on 05/27/2016 at 5:03 PM EDT X-ray shows bilateral cervical neural foraminal stenosis at C5 and C6 and C6-C7 due to bone spurs. He could have a pinched nerve at either of these levels      Vitals   Height Weight BMI (Calculated)  5\' 10"  (1.778 m) 248 lb (112.5 kg) 34.5  Protocol Documents   Imaging  Protocol  External Result Report   External Result Report  Imaging   Imaging Information  Signed by   Signed Date/Time  Phone Pager  Lavonia Dana 05/27/2016 1:27 PM [phone]  Result Notes   Notes recorded by Susy Frizzle, MD on 05/29/2016 at 6:51 AM EDT That is what we expected. Try the prednisone I gave him at the [facility] and recheck with me if no better in 2 weeks. ------  Notes recorded by Olena Mater, LPN on 05/30/5033 at 46:56 AM EDT What do want me to tell pt?? What next step?? ------  Notes recorded by Susy Frizzle, MD on 05/27/2016 at 5:03 PM EDT X-ray shows bilateral cervical neural foraminal stenosis at C5 and C6 and C6-C7 due to bone spurs. He could have a pinched nerve at either of these levels      Signed   Electronically signed by Lavonia Dana, MD on 05/27/16 at [place] EDT  Study Notes     Salvadore Dom, RT on 05/27/2016 12:06 PM  Neck pain x 6 months with radiating left shoulder pain, there is NKI    Original Order   Ordered On Ordered By   05/27/2016 11:30 AM Susy Frizzle, MD

## 2016-05-30 NOTE — Telephone Encounter (Signed)
Pt returning phone call ° °

## 2016-06-03 ENCOUNTER — Other Ambulatory Visit: Payer: Self-pay | Admitting: Family Medicine

## 2016-06-03 MED ORDER — PREDNISONE 20 MG PO TABS: ORAL_TABLET | ORAL | 0 refills | Status: DC

## 2016-06-13 ENCOUNTER — Ambulatory Visit (INDEPENDENT_AMBULATORY_CARE_PROVIDER_SITE_OTHER): Payer: PPO | Admitting: *Deleted

## 2016-06-13 DIAGNOSIS — I638 Other cerebral infarction: Secondary | ICD-10-CM | POA: Diagnosis not present

## 2016-06-13 DIAGNOSIS — I6389 Other cerebral infarction: Secondary | ICD-10-CM

## 2016-06-16 NOTE — Progress Notes (Signed)
Carelink Summary Report / Loop Recorder 

## 2016-06-20 LAB — CUP PACEART REMOTE DEVICE CHECK
Date Time Interrogation Session: 20180420201424
Implantable Pulse Generator Implant Date: 20160629

## 2016-07-14 ENCOUNTER — Ambulatory Visit (INDEPENDENT_AMBULATORY_CARE_PROVIDER_SITE_OTHER): Payer: PPO | Admitting: *Deleted

## 2016-07-14 DIAGNOSIS — I6389 Other cerebral infarction: Secondary | ICD-10-CM

## 2016-07-14 DIAGNOSIS — I638 Other cerebral infarction: Secondary | ICD-10-CM | POA: Diagnosis not present

## 2016-07-14 NOTE — Progress Notes (Signed)
Carelink Summary Report / Loop Recorder 

## 2016-07-17 LAB — CUP PACEART REMOTE DEVICE CHECK
Date Time Interrogation Session: 20180520204202
Implantable Pulse Generator Implant Date: 20160629

## 2016-07-25 ENCOUNTER — Telehealth: Payer: Self-pay | Admitting: Family Medicine

## 2016-07-25 NOTE — Telephone Encounter (Signed)
Samples in Fridge and pt aware to p/u

## 2016-07-25 NOTE — Telephone Encounter (Signed)
Patient calling to see if he can get samples of basaglar  [phone]

## 2016-08-12 ENCOUNTER — Ambulatory Visit (INDEPENDENT_AMBULATORY_CARE_PROVIDER_SITE_OTHER): Payer: PPO | Admitting: *Deleted

## 2016-08-12 DIAGNOSIS — I638 Other cerebral infarction: Secondary | ICD-10-CM

## 2016-08-12 DIAGNOSIS — I6389 Other cerebral infarction: Secondary | ICD-10-CM

## 2016-08-14 NOTE — Progress Notes (Signed)
Carelink Summary Report / Loop Recorder 

## 2016-08-18 ENCOUNTER — Telehealth: Payer: Self-pay | Admitting: *Deleted

## 2016-08-18 MED ORDER — INSULIN GLARGINE 100 UNIT/ML SOLOSTAR PEN: 45.0000 [IU] | PEN_INJECTOR | Freq: Every day | SUBCUTANEOUS | 3 refills | Status: AC

## 2016-08-18 NOTE — Telephone Encounter (Signed)
Received call from patient.   Reports that he is running out of insulin [facility] health practitioner) and cannot afford prescription. Requested sample of insulin. No Basaglar samples available at this time.

## 2016-08-18 NOTE — Telephone Encounter (Signed)
We are out of all insulin at this time. His insurance has changed formulary and is now wanting pt to take Lantus. Per [facility] ok to change and rx sent to [facility] as requested.

## 2016-08-20 LAB — CUP PACEART REMOTE DEVICE CHECK
Date Time Interrogation Session: 20180619203917
Implantable Pulse Generator Implant Date: 20160629

## 2016-08-28 ENCOUNTER — Telehealth: Payer: Self-pay | Admitting: Family Medicine

## 2016-08-28 MED ORDER — ONETOUCH VERIO W/DEVICE KIT: 1.0000 | PACK | Freq: Two times a day (BID) | 2 refills | Status: AC

## 2016-08-28 MED ORDER — GLUCOSE BLOOD VI STRP: ORAL_STRIP | 12 refills | Status: AC

## 2016-08-28 NOTE — Telephone Encounter (Signed)
Requested supplies and strips sent to [facility]

## 2016-08-28 NOTE — Telephone Encounter (Signed)
Pt can get free diabetes testing meter with a prescription. Testing meter is called one touch verio, he also needs the test strips to go with the meter as well. Still using [facility] on [address].

## 2016-09-08 ENCOUNTER — Ambulatory Visit: Payer: PPO | Admitting: Family Medicine

## 2016-09-11 ENCOUNTER — Encounter: Payer: Self-pay | Admitting: Family Medicine

## 2016-09-11 ENCOUNTER — Ambulatory Visit (INDEPENDENT_AMBULATORY_CARE_PROVIDER_SITE_OTHER): Payer: PPO | Admitting: *Deleted

## 2016-09-11 ENCOUNTER — Ambulatory Visit (INDEPENDENT_AMBULATORY_CARE_PROVIDER_SITE_OTHER): Payer: PPO | Admitting: Family Medicine

## 2016-09-11 VITALS — BP 120/72 | HR 70 | Temp 99.3°F | Resp 18 | Ht 70.0 in | Wt 248.0 lb

## 2016-09-11 DIAGNOSIS — I6389 Other cerebral infarction: Secondary | ICD-10-CM

## 2016-09-11 DIAGNOSIS — R5382 Chronic fatigue, unspecified: Secondary | ICD-10-CM | POA: Diagnosis not present

## 2016-09-11 DIAGNOSIS — I638 Other cerebral infarction: Secondary | ICD-10-CM

## 2016-09-11 NOTE — Progress Notes (Signed)
Subjective:    Patient ID: James Mcclain, male    DOB: [birth year], 71 y.o.   MRN: [id number]  HPI Patient reports severe fatigue. He states that he just doesn't have any energy. Whenever he goes to try to exercise, he feels extremely weak and tired. He denies any chest pain. He denies any shortness of breath. He denies any dyspnea on exertion. He denies any orthopnea. He does occasionally have lightheadedness. He denies any weight loss. He is 248 pounds today which is the same as his weight in April. He denies any melena or hematochezia. He denies any dysuria or hematuria. He denies any rash or abdominal pain. He does have a history of prostate cancer status post prostate radiation therapy. He has a history of low testosterone but he is unable to take testosterone replacement due to his history of prostate cancer. This could be contributing to his fatigue. He is also on numerous antihypertensive medications at time his blood pressure is low at home and he is having some lightheadedness. For instance he is taking Cardura which could be contributing to some of the symptoms. He is due for lab work Past Medical History:  Diagnosis Date  . Arthritis   . Coronary artery disease    a. s/p Xience DES to the LAD 07/2012.  . Dilated aortic root ([place])    a. Mildly dilated by echo 09/2013, f/u MRA scheduled for 12/2014.  [name] Elevated PSA   . Essential hypertension   . History of kidney stones   . Hx of gout   . Hyperlipidemia   . Insulin dependent diabetes mellitus ([place])   . 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 On tele 09/2013.  [name] PFO (patent foramen ovale)    a. By echo 09/2013.  [name] Polycythemia   . Prostate cancer ([place]) 07/15/12   Gleason 7, external beam radiation Tx, Dr. Pilar Jarvis  . PVC's (premature ventricular contractions)   . Stroke [facility])    a. Cryptogenic, 09/2013, event monitor with NSR. Small PFO noted on echo but neg LE duplex.   Past Surgical History:  Procedure Laterality Date  .  COLONOSCOPY WITH PROPOFOL N/A 02/22/2016   Procedure: COLONOSCOPY WITH PROPOFOL;  Surgeon: Carol Ada, MD;  [facility];  Service: Endoscopy;  Laterality: N/A;  . CORONARY ANGIOPLASTY WITH STENT PLACEMENT  08/05/2012   LAD, 1 stent  . EP IMPLANTABLE DEVICE N/A 08/23/2014   Procedure: Loop Recorder Insertion;  Surgeon: Evans Lance, MD;  Location: [place] CV LAB;  Service: Cardiovascular;  Laterality: N/A;  . KIDNEY STONES     2006 - LITHOTRIPSY  . PERCUTANEOUS CORONARY STENT INTERVENTION (PCI-S) N/A 08/05/2012   Procedure: PERCUTANEOUS CORONARY STENT INTERVENTION (PCI-S);  Surgeon: Sherren Mocha, MD;  Location: [facility] CATH LAB;  Service: Cardiovascular;  Laterality: N/A;  . PROSTATE BIOPSY  07/15/2012  . PROSTATE BIOPSY  09/23/2013  . TEE WITHOUT CARDIOVERSION N/A 09/27/2013   Procedure: TRANSESOPHAGEAL ECHOCARDIOGRAM (TEE);  Surgeon: Larey Dresser, MD;  Location: [facility];  Service: Cardiovascular;  Laterality: N/A;  . TONSILLECTOMY     as adult  . TOTAL KNEE ARTHROPLASTY  01/24/2011   Procedure: TOTAL KNEE ARTHROPLASTY;  Surgeon: Alta Corning;  Location: [place];  Service: Orthopedics;  Laterality: Left;  COMPUTER ASSISTED LEFT  TOTAL KNEE REPLACEMENT. Anesthesia a combination of regional and general.   Current Outpatient Prescriptions on File Prior to Visit  Medication Sig Dispense Refill  . allopurinol (ZYLOPRIM) 100 MG tablet Take 2  tablets (200 mg total) by mouth daily. 180 tablet 1  . amLODipine (NORVASC) 10 MG tablet Take 1 tablet (10 mg total) by mouth daily. 90 tablet 1  . apixaban (ELIQUIS) 5 MG TABS tablet Take 1 tablet (5 mg total) by mouth 2 (two) times daily. 180 tablet 3  . aspirin 81 MG tablet Take 81 mg by mouth daily. Reported on 04/05/2015    . atorvastatin (LIPITOR) 40 MG tablet Take 1 tablet (40 mg total) by mouth daily. 90 tablet 1  . Blood Glucose Monitoring Suppl (ONETOUCH VERIO) w/Device KIT 1 Device by Does not apply route 2 (two) times daily. E11.9 1  kit 2  . carvedilol (COREG) 12.5 MG tablet Take 1 tablet (12.5 mg total) by mouth 2 (two) times daily. 180 tablet 3  . doxazosin (CARDURA) 4 MG tablet Take 1 tablet (4 mg total) by mouth daily. 90 tablet 3  . glucose blood test strip Check BS bid DX: E11.9 100 each 12  . Insulin Glargine (LANTUS SOLOSTAR) 100 UNIT/ML Solostar Pen Inject 45 Units into the skin daily. 15 mL 3  . losartan-hydrochlorothiazide (HYZAAR) 100-25 MG tablet Take 1 tablet by mouth daily. 90 tablet 3  . magnesium oxide (MAG-OX) 400 MG tablet Take 1 tablet (400 mg total) by mouth daily. 90 tablet 2  . metFORMIN (GLUCOPHAGE) 500 MG tablet TAKE TWO TABLETS BY MOUTH TWICE DAILY WITH  A  MEAL 360 tablet 3   No current facility-administered medications on file prior to visit.    Allergies  Allergen Reactions  . Betadine [Povidone Iodine] Other (See Comments)    Patient doesn't remember  . Contrast Media [Iodinated Diagnostic Agents] Other (See Comments)    Patient doesn't remember  . Fish Allergy Swelling   Social History   Social History  . Marital status: Single    Spouse name: N/A  . Number of children: 3  . Years of education: 12th   Occupational History  . retired    Social History Main Topics  . Smoking status: Never Smoker  . Smokeless tobacco: Never Used  . Alcohol use No  . Drug use: No  . Sexual activity: Not Currently   Other Topics Concern  . Not on file   Social History Narrative   [name]   Patient is right handed   Patient drinks coffee and sodas      Review of Systems  All other systems reviewed and are negative.      Objective:   Physical Exam  Constitutional: He appears well-developed and well-nourished. No distress.  HENT:  Mouth/Throat: No oropharyngeal exudate.  Neck: Neck supple. No JVD present. No thyromegaly present.  Cardiovascular: Normal rate, regular rhythm and normal heart sounds.  Exam reveals no gallop and no friction rub.   No  murmur heard. Pulmonary/Chest: Effort normal and breath sounds normal. No respiratory distress. He has no wheezes. He has no rales.  Abdominal: Soft. Bowel sounds are normal. He exhibits no distension. There is no tenderness. There is no rebound and no guarding.  Musculoskeletal: He exhibits no edema.  Lymphadenopathy:    He has no cervical adenopathy.  Skin: No rash noted. He is not diaphoretic.  Vitals reviewed.         Assessment & Plan:  Chronic fatigue - Plan: CBC with Differential/Platelet, COMPLETE METABOLIC PANEL WITH GFR, TSH, Hemoglobin A1c, Vitamin B12, Testosterone  I suspect that his chronic fatigue is secondary to hypogonadism. However we discussed that  he cannot take testosterone replacement. I will check a testosterone level to confirm a suspicion. I'll also check a vitamin B12 level and as well as a TSH. Also check a CBC, CMP, hemoglobin A1c, and a sedimentation rate. Discontinue Cardura. Try over-the-counter vitamin B12 supplement 1000 g by mouth daily.

## 2016-09-12 ENCOUNTER — Other Ambulatory Visit: Payer: Self-pay | Admitting: Family Medicine

## 2016-09-12 ENCOUNTER — Encounter: Payer: Self-pay | Admitting: Family Medicine

## 2016-09-12 DIAGNOSIS — R5383 Other fatigue: Secondary | ICD-10-CM

## 2016-09-12 LAB — CBC WITH DIFFERENTIAL/PLATELET
Basophils Absolute: 78 cells/uL (ref 0–200)
Basophils Relative: 1 %
Eosinophils Absolute: 468 cells/uL (ref 15–500)
Eosinophils Relative: 6 %
HCT: 52.9 % — ABNORMAL HIGH (ref 38.5–50.0)
Hemoglobin: 16 g/dL (ref 13.0–17.0)
Lymphocytes Relative: 25 %
Lymphs Abs: 1950 cells/uL (ref 850–3900)
MCH: 23.9 pg — ABNORMAL LOW (ref 27.0–33.0)
MCHC: 30.2 g/dL — ABNORMAL LOW (ref 32.0–36.0)
MCV: 79.1 fL — ABNORMAL LOW (ref 80.0–100.0)
Monocytes Absolute: 624 cells/uL (ref 200–950)
Monocytes Relative: 8 %
Neutro Abs: 4680 cells/uL (ref 1500–7800)
Neutrophils Relative %: 60 %
Platelets: 290 10*3/uL (ref 140–400)
RBC: 6.69 MIL/uL — ABNORMAL HIGH (ref 4.20–5.80)
RDW: 20 % — ABNORMAL HIGH (ref 11.0–15.0)
WBC: 7.8 10*3/uL (ref 3.8–10.8)

## 2016-09-12 LAB — COMPLETE METABOLIC PANEL WITH GFR
ALT: 12 U/L (ref 9–46)
AST: 14 U/L (ref 10–35)
Albumin: 3.7 g/dL (ref 3.6–5.1)
Alkaline Phosphatase: 57 U/L (ref 40–115)
BUN: 22 mg/dL (ref 7–25)
CO2: 20 mmol/L (ref 20–31)
Calcium: 9 mg/dL (ref 8.6–10.3)
Chloride: 106 mmol/L (ref 98–110)
Creat: 1.46 mg/dL — ABNORMAL HIGH (ref 0.70–1.25)
GFR, Est African American: 56 mL/min — ABNORMAL LOW (ref >=60)
GFR, Est Non African American: 48 mL/min — ABNORMAL LOW (ref >=60)
Glucose, Bld: 95 mg/dL (ref 70–99)
Potassium: 4.9 mmol/L (ref 3.5–5.3)
Sodium: 144 mmol/L (ref 135–146)
Total Bilirubin: 0.8 mg/dL (ref 0.2–1.2)
Total Protein: 6.9 g/dL (ref 6.1–8.1)

## 2016-09-12 LAB — VITAMIN B12: Vitamin B-12: 595 pg/mL (ref 200–1100)

## 2016-09-12 LAB — TSH: TSH: 0.44 mIU/L (ref 0.40–4.50)

## 2016-09-12 LAB — HEMOGLOBIN A1C
Hgb A1c MFr Bld: 6.1 % — ABNORMAL HIGH (ref <5.7)
Mean Plasma Glucose: 128 mg/dL

## 2016-09-12 LAB — TESTOSTERONE: Testosterone: 207 ng/dL — ABNORMAL LOW (ref 250–827)

## 2016-09-16 NOTE — Progress Notes (Signed)
Carelink Summary Report / Loop Recorder 

## 2016-09-19 ENCOUNTER — Other Ambulatory Visit: Payer: Self-pay | Admitting: Family Medicine

## 2016-09-19 ENCOUNTER — Telehealth: Payer: Self-pay | Admitting: Family Medicine

## 2016-09-19 DIAGNOSIS — I480 Paroxysmal atrial fibrillation: Secondary | ICD-10-CM

## 2016-09-19 MED ORDER — ALLOPURINOL 100 MG PO TABS: 200.0000 mg | ORAL_TABLET | Freq: Every day | ORAL | 1 refills | Status: DC

## 2016-09-19 MED ORDER — AMLODIPINE BESYLATE 10 MG PO TABS: 10.0000 mg | ORAL_TABLET | Freq: Every day | ORAL | 3 refills | Status: DC

## 2016-09-19 MED ORDER — APIXABAN 5 MG PO TABS: 5.0000 mg | ORAL_TABLET | Freq: Two times a day (BID) | ORAL | 3 refills | Status: DC

## 2016-09-19 NOTE — Telephone Encounter (Signed)
Medication called/sent to requested pharmacy  

## 2016-09-19 NOTE — Telephone Encounter (Signed)
Patient is calling and needs refills on his eliquis and amlodipine sent to [facility] on [address] [phone]

## 2016-09-26 LAB — CUP PACEART REMOTE DEVICE CHECK
Date Time Interrogation Session: 20180720075025
Implantable Pulse Generator Implant Date: 20160629

## 2016-10-01 ENCOUNTER — Other Ambulatory Visit: Payer: PPO

## 2016-10-01 ENCOUNTER — Ambulatory Visit: Payer: PPO | Admitting: Oncology

## 2016-10-13 ENCOUNTER — Ambulatory Visit (INDEPENDENT_AMBULATORY_CARE_PROVIDER_SITE_OTHER): Payer: PPO | Admitting: *Deleted

## 2016-10-13 DIAGNOSIS — I638 Other cerebral infarction: Secondary | ICD-10-CM | POA: Diagnosis not present

## 2016-10-13 DIAGNOSIS — I6389 Other cerebral infarction: Secondary | ICD-10-CM

## 2016-10-14 NOTE — Progress Notes (Signed)
Carelink Summary Report / Loop Recorder 

## 2016-10-17 LAB — CUP PACEART REMOTE DEVICE CHECK
Date Time Interrogation Session: 20180819124030
Implantable Pulse Generator Implant Date: 20160629

## 2016-11-11 ENCOUNTER — Ambulatory Visit (INDEPENDENT_AMBULATORY_CARE_PROVIDER_SITE_OTHER): Payer: PPO | Admitting: *Deleted

## 2016-11-11 DIAGNOSIS — I638 Other cerebral infarction: Secondary | ICD-10-CM | POA: Diagnosis not present

## 2016-11-11 DIAGNOSIS — I6389 Other cerebral infarction: Secondary | ICD-10-CM

## 2016-11-11 NOTE — Progress Notes (Signed)
Carelink Summary Report / Loop Recorder 

## 2016-11-13 LAB — CUP PACEART REMOTE DEVICE CHECK
Date Time Interrogation Session: 20180918123953
Implantable Pulse Generator Implant Date: 20160629

## 2016-12-03 ENCOUNTER — Telehealth: Payer: Self-pay | Admitting: Family Medicine

## 2016-12-03 NOTE — Telephone Encounter (Signed)
Patient is calling to see if he can change from eliquis to another blood thinner, it is too expensive!

## 2016-12-03 NOTE — Telephone Encounter (Signed)
Please see note below and advise  

## 2016-12-04 NOTE — Telephone Encounter (Signed)
Spoke with patient he  has scheduled an appt to see provider on 10/15 to discuss blood thinner medication change

## 2016-12-04 NOTE — Telephone Encounter (Signed)
We would have to start him on coumadin which requires monthly lab checks.  Be glad to see him to discuss and start tx.

## 2016-12-08 ENCOUNTER — Ambulatory Visit (INDEPENDENT_AMBULATORY_CARE_PROVIDER_SITE_OTHER): Payer: PPO | Admitting: Family Medicine

## 2016-12-08 ENCOUNTER — Encounter: Payer: Self-pay | Admitting: Family Medicine

## 2016-12-08 ENCOUNTER — Telehealth: Payer: Self-pay | Admitting: Family Medicine

## 2016-12-08 VITALS — BP 140/76 | HR 72 | Temp 98.1°F | Resp 16 | Ht 70.0 in | Wt 245.0 lb

## 2016-12-08 DIAGNOSIS — I480 Paroxysmal atrial fibrillation: Secondary | ICD-10-CM | POA: Diagnosis not present

## 2016-12-08 MED ORDER — WARFARIN SODIUM 5 MG PO TABS: 5.0000 mg | ORAL_TABLET | Freq: Every day | ORAL | 3 refills | Status: DC

## 2016-12-08 NOTE — Telephone Encounter (Signed)
Pharmacy needs clarification on Eliquis and Warfarin.  Says pt has prescription for both.

## 2016-12-08 NOTE — Progress Notes (Signed)
Subjective:    Patient ID: James Mcclain, male    DOB: October 23, 1945, 71 y.o.   MRN: [id number]  HPI Patient is a 71 AAM with history of type II diabetes, HTN, CKD, paroxysmal atrial fibrillation, CVA, and CAD s/p PCI presents for discussion of anticoagulation.  Left heart cath in 6/14 showed 90% proximal LAD stenosis.  This was treated with Xience DES.  In 8/15, he had a CVA treated with TPA.  Event monitor in 8/15 did not show atrial fibrillation.  Loop recorder was placed which revealed atrial fibrillation in 07/2015.  He is on eliquis 5 mg pobid but the medicine has been costing him 150 dollars every month and he stopped the medicine because he could not afford it.  He is here today to discuss other options Past Medical History:  Diagnosis Date  . Arthritis   . Coronary artery disease    a. s/p Xience DES to the LAD 07/2012.  . Dilated aortic root ([place])    a. Mildly dilated by echo 09/2013, f/u MRA scheduled for 12/2014.  [name] Elevated PSA   . Essential hypertension   . History of kidney stones   . Hx of gout   . Hyperlipidemia   . Insulin dependent diabetes mellitus ([place])   . 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 On tele 09/2013.  [name] PFO (patent foramen ovale)    a. By echo 09/2013.  [name] Polycythemia   . Prostate cancer ([place]) 07/15/12   Gleason 7, external beam radiation Tx, Dr. Pilar Jarvis  . PVC's (premature ventricular contractions)   . Stroke [facility])    a. Cryptogenic, 09/2013, event monitor with NSR. Small PFO noted on echo but neg LE duplex.   Past Surgical History:  Procedure Laterality Date  . COLONOSCOPY WITH PROPOFOL N/A 02/22/2016   Procedure: COLONOSCOPY WITH PROPOFOL;  Surgeon: Carol Ada, MD;  Location: [facility];  Service: Endoscopy;  Laterality: N/A;  . CORONARY ANGIOPLASTY WITH STENT PLACEMENT  08/05/2012   LAD, 1 stent  . EP IMPLANTABLE DEVICE N/A 08/23/2014   Procedure: Loop Recorder Insertion;  Surgeon: Evans Lance, MD;  Location: [place] CV LAB;  Service:  Cardiovascular;  Laterality: N/A;  . KIDNEY STONES     2006 - LITHOTRIPSY  . PERCUTANEOUS CORONARY STENT INTERVENTION (PCI-S) N/A 08/05/2012   Procedure: PERCUTANEOUS CORONARY STENT INTERVENTION (PCI-S);  Surgeon: Sherren Mocha, MD;  Location: [facility] CATH LAB;  Service: Cardiovascular;  Laterality: N/A;  . PROSTATE BIOPSY  07/15/2012  . PROSTATE BIOPSY  09/23/2013  . TEE WITHOUT CARDIOVERSION N/A 09/27/2013   Procedure: TRANSESOPHAGEAL ECHOCARDIOGRAM (TEE);  Surgeon: Larey Dresser, MD;  Location: [place];  Service: Cardiovascular;  Laterality: N/A;  . TONSILLECTOMY     as adult  . TOTAL KNEE ARTHROPLASTY  01/24/2011   Procedure: TOTAL KNEE ARTHROPLASTY;  Surgeon: Alta Corning;  Location: [place];  Service: Orthopedics;  Laterality: Left;  COMPUTER ASSISTED LEFT  TOTAL KNEE REPLACEMENT. Anesthesia a combination of regional and general.   Current Outpatient Prescriptions on File Prior to Visit  Medication Sig Dispense Refill  . allopurinol (ZYLOPRIM) 100 MG tablet Take 2 tablets (200 mg total) by mouth daily. 180 tablet 1  . amLODipine (NORVASC) 10 MG tablet Take 1 tablet (10 mg total) by mouth daily. 90 tablet 3  . aspirin 81 MG tablet Take 81 mg by mouth daily. Reported on 04/05/2015    . atorvastatin (LIPITOR) 40 MG tablet Take 1 tablet (40 mg total) by mouth  daily. 90 tablet 1  . Blood Glucose Monitoring Suppl (ONETOUCH VERIO) w/Device KIT 1 Device by Does not apply route 2 (two) times daily. E11.9 1 kit 2  . carvedilol (COREG) 12.5 MG tablet Take 1 tablet (12.5 mg total) by mouth 2 (two) times daily. 180 tablet 3  . glucose blood test strip Check BS bid DX: E11.9 100 each 12  . Insulin Glargine (LANTUS SOLOSTAR) 100 UNIT/ML Solostar Pen Inject 45 Units into the skin daily. 15 mL 3  . losartan-hydrochlorothiazide (HYZAAR) 100-25 MG tablet Take 1 tablet by mouth daily. 90 tablet 3  . metFORMIN (GLUCOPHAGE) 500 MG tablet TAKE TWO TABLETS BY MOUTH TWICE DAILY WITH  A  MEAL 360 tablet 3  .  apixaban (ELIQUIS) 5 MG TABS tablet Take 1 tablet (5 mg total) by mouth 2 (two) times daily. (Patient not taking: Reported on 12/08/2016) 180 tablet 3   No current facility-administered medications on file prior to visit.    Allergies  Allergen Reactions  . Betadine [Povidone Iodine] Other (See Comments)    Patient doesn't remember  . Contrast Media [Iodinated Diagnostic Agents] Other (See Comments)    Patient doesn't remember  . Fish Allergy Swelling   Social History   Social History  . Marital status: Single    Spouse name: N/A  . Number of children: 3  . Years of education: 12th   Occupational History  . retired    Social History Main Topics  . Smoking status: Never Smoker  . Smokeless tobacco: Never Used  . Alcohol use No  . Drug use: No  . Sexual activity: Not Currently   Other Topics Concern  . Not on file   Social History Narrative   [name]   Patient is right handed   Patient drinks coffee and sodas      Review of Systems  All other systems reviewed and are negative.      Objective:   Physical Exam  Constitutional: He is oriented to person, place, and time. He appears well-developed and well-nourished.  Cardiovascular: Normal rate, regular rhythm and normal heart sounds.   No murmur heard. Pulmonary/Chest: Effort normal and breath sounds normal. No respiratory distress. He has no wheezes. He has no rales. He exhibits no tenderness.  Neurological: He is alert and oriented to person, place, and time. No cranial nerve deficit. He exhibits normal muscle tone. Coordination normal.  Vitals reviewed.         Assessment & Plan:  Paroxysmal atrial fibrillation I recommended that the patient call his insurance and check to see if pradaxa or xarelto would be more affordable.  If so, I will switch to the affordable option.  If not, start coumadin 5 mg poqday and recheck INR in 1 week.

## 2016-12-08 NOTE — Telephone Encounter (Signed)
He is checking on price and if can't afford eliquis will switch to warfarin

## 2016-12-09 NOTE — Telephone Encounter (Signed)
Pharmacy made aware.  They ask in such situations to place comment on RX so they will know if that is the case.

## 2016-12-11 ENCOUNTER — Ambulatory Visit (INDEPENDENT_AMBULATORY_CARE_PROVIDER_SITE_OTHER): Payer: PPO | Admitting: *Deleted

## 2016-12-11 DIAGNOSIS — I6389 Other cerebral infarction: Secondary | ICD-10-CM

## 2016-12-11 NOTE — Progress Notes (Signed)
Carelink Summary Report / loop Recorder  

## 2016-12-12 LAB — CUP PACEART REMOTE DEVICE CHECK
Date Time Interrogation Session: 20181018134034
Implantable Pulse Generator Implant Date: 20160629

## 2016-12-16 ENCOUNTER — Encounter: Payer: Self-pay | Admitting: Family Medicine

## 2016-12-16 ENCOUNTER — Ambulatory Visit (INDEPENDENT_AMBULATORY_CARE_PROVIDER_SITE_OTHER): Payer: PPO | Admitting: Family Medicine

## 2016-12-16 VITALS — BP 122/70 | HR 68 | Temp 98.4°F | Resp 16 | Ht 70.0 in | Wt 246.0 lb

## 2016-12-16 DIAGNOSIS — I480 Paroxysmal atrial fibrillation: Secondary | ICD-10-CM

## 2016-12-16 DIAGNOSIS — I639 Cerebral infarction, unspecified: Secondary | ICD-10-CM

## 2016-12-16 LAB — PT WITH INR/FINGERSTICK
INR, fingerstick: 1.9 ratio — ABNORMAL HIGH
PT, fingerstick: 23.1 s — ABNORMAL HIGH (ref 10.5–13.1)

## 2016-12-16 MED ORDER — "INSULIN SYRINGE-NEEDLE U-100 30G X 5/16"" 0.5 ML MISC": 11 refills | Status: DC

## 2016-12-16 NOTE — Progress Notes (Signed)
Subjective:    Patient ID: James Mcclain, male    DOB: 12-10-1945, 71 y.o.   MRN: [id number]  HPI  12/08/16 Patient is a 39 AAM with history of type II diabetes, HTN, CKD, paroxysmal atrial fibrillation, CVA, and CAD s/p PCI presents for discussion of anticoagulation.  Left heart cath in 6/14 showed 90% proximal LAD stenosis.  This was treated with Xience DES.  In 8/15, he had a CVA treated with TPA.  Event monitor in 8/15 did not show atrial fibrillation.  Loop recorder was placed which revealed atrial fibrillation in 07/2015.  He is on eliquis 5 mg pobid but the medicine has been costing him 150 dollars every month and he stopped the medicine because he could not afford it.  He is here today to discuss other options.  AT that time, my plan was: I recommended that the patient call his insurance and check to see if pradaxa or xarelto would be more affordable.  If so, I will switch to the affordable option.  If not, start coumadin 5 mg poqday and recheck INR in 1 week.  12/16/16 Patient ultimately elected to start Coumadin 5 mg every day as he cannot afford the other medication.  He is here today to recheck his INR.  He has been taking the Coumadin now for 7 days.  His INR today in clinic is 1.9.  He denies any bleeding or bruising.  Past Medical History:  Diagnosis Date  . Arthritis   . Coronary artery disease    a. s/p Xience DES to the LAD 07/2012.  . Dilated aortic root ([place])    a. Mildly dilated by echo 09/2013, f/u MRA scheduled for 12/2014.  [name] Elevated PSA   . Essential hypertension   . History of kidney stones   . Hx of gout   . Hyperlipidemia   . Insulin dependent diabetes mellitus ([place])   . 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 On tele 09/2013.  [name] Paroxysmal atrial fibrillation (HCC)   . PFO (patent foramen ovale)    a. By echo 09/2013.  [name] Polycythemia   . Prostate cancer ([place]) 07/15/12   Gleason 7, external beam radiation Tx, Dr. Pilar Jarvis  . PVC's (premature ventricular  contractions)   . Stroke [facility])    a. Cryptogenic, 09/2013, event monitor with NSR. Small PFO noted on echo but neg LE duplex.   Past Surgical History:  Procedure Laterality Date  . COLONOSCOPY WITH PROPOFOL N/A 02/22/2016   Procedure: COLONOSCOPY WITH PROPOFOL;  Surgeon: Carol Ada, MD;  [facility];  Service: Endoscopy;  Laterality: N/A;  . CORONARY ANGIOPLASTY WITH STENT PLACEMENT  08/05/2012   LAD, 1 stent  . EP IMPLANTABLE DEVICE N/A 08/23/2014   Procedure: Loop Recorder Insertion;  Surgeon: Evans Lance, MD;  Location: [place] CV LAB;  Service: Cardiovascular;  Laterality: N/A;  . KIDNEY STONES     2006 - LITHOTRIPSY  . PERCUTANEOUS CORONARY STENT INTERVENTION (PCI-S) N/A 08/05/2012   Procedure: PERCUTANEOUS CORONARY STENT INTERVENTION (PCI-S);  Surgeon: Sherren Mocha, MD;  Location: [facility] CATH LAB;  Service: Cardiovascular;  Laterality: N/A;  . PROSTATE BIOPSY  07/15/2012  . PROSTATE BIOPSY  09/23/2013  . TEE WITHOUT CARDIOVERSION N/A 09/27/2013   Procedure: TRANSESOPHAGEAL ECHOCARDIOGRAM (TEE);  Surgeon: Larey Dresser, MD;  Location: [place];  Service: Cardiovascular;  Laterality: N/A;  . TONSILLECTOMY     as adult  . TOTAL KNEE ARTHROPLASTY  01/24/2011   Procedure: TOTAL KNEE ARTHROPLASTY;  Surgeon: Alta Corning;  Location: [place];  Service: Orthopedics;  Laterality: Left;  COMPUTER ASSISTED LEFT  TOTAL KNEE REPLACEMENT. Anesthesia a combination of regional and general.   Current Outpatient Prescriptions on File Prior to Visit  Medication Sig Dispense Refill  . allopurinol (ZYLOPRIM) 100 MG tablet Take 2 tablets (200 mg total) by mouth daily. 180 tablet 1  . amLODipine (NORVASC) 10 MG tablet Take 1 tablet (10 mg total) by mouth daily. 90 tablet 3  . aspirin 81 MG tablet Take 81 mg by mouth daily. Reported on 04/05/2015    . atorvastatin (LIPITOR) 40 MG tablet Take 1 tablet (40 mg total) by mouth daily. 90 tablet 1  . Blood Glucose Monitoring Suppl (ONETOUCH  VERIO) w/Device KIT 1 Device by Does not apply route 2 (two) times daily. E11.9 1 kit 2  . carvedilol (COREG) 12.5 MG tablet Take 1 tablet (12.5 mg total) by mouth 2 (two) times daily. 180 tablet 3  . glucose blood test strip Check BS bid DX: E11.9 100 each 12  . Insulin Glargine (LANTUS SOLOSTAR) 100 UNIT/ML Solostar Pen Inject 45 Units into the skin daily. 15 mL 3  . losartan-hydrochlorothiazide (HYZAAR) 100-25 MG tablet Take 1 tablet by mouth daily. 90 tablet 3  . metFORMIN (GLUCOPHAGE) 500 MG tablet TAKE TWO TABLETS BY MOUTH TWICE DAILY WITH  A  MEAL 360 tablet 3  . warfarin (COUMADIN) 5 MG tablet Take 1 tablet (5 mg total) by mouth daily. 30 tablet 3   No current facility-administered medications on file prior to visit.    Allergies  Allergen Reactions  . Betadine [Povidone Iodine] Other (See Comments)    Patient doesn't remember  . Contrast Media [Iodinated Diagnostic Agents] Other (See Comments)    Patient doesn't remember  . Fish Allergy Swelling   Social History   Social History  . Marital status: Single    Spouse name: N/A  . Number of children: 3  . Years of education: 12th   Occupational History  . retired    Social History Main Topics  . Smoking status: Never Smoker  . Smokeless tobacco: Never Used  . Alcohol use No  . Drug use: No  . Sexual activity: Not Currently   Other Topics Concern  . Not on file   Social History Narrative   [name]   Patient is right handed   Patient drinks coffee and sodas      Review of Systems  All other systems reviewed and are negative.      Objective:   Physical Exam  Constitutional: He is oriented to person, place, and time. He appears well-developed and well-nourished.  Cardiovascular: Normal rate, regular rhythm and normal heart sounds.   No murmur heard. Pulmonary/Chest: Effort normal and breath sounds normal. No respiratory distress. He has no wheezes. He has no rales. He exhibits no  tenderness.  Neurological: He is alert and oriented to person, place, and time. No cranial nerve deficit. He exhibits normal muscle tone. Coordination normal.  Vitals reviewed.         Assessment & Plan:  .Cerebrovascular accident (CVA), unspecified mechanism ([place]) - Plan: PT with INR/Fingerstick, PT with INR/Fingerstick  Paroxysmal atrial fibrillation ([place])  His INR today is 1.9 and is almost therapeutic.  The patient just started Coumadin and therefore his INR should still be trending upwards.  Hence I will not increase his Coumadin today but I will recheck a level in 2 weeks.  If therapeutic at that time, I will change the patient's regimen to every 4 weeks.  We will continue Coumadin 5 mg every day

## 2016-12-18 ENCOUNTER — Ambulatory Visit (INDEPENDENT_AMBULATORY_CARE_PROVIDER_SITE_OTHER): Payer: PPO | Admitting: Family Medicine

## 2016-12-18 ENCOUNTER — Encounter: Payer: Self-pay | Admitting: Family Medicine

## 2016-12-18 VITALS — BP 122/80 | HR 76 | Temp 98.4°F | Resp 16 | Ht 70.0 in | Wt 245.0 lb

## 2016-12-18 DIAGNOSIS — K921 Melena: Secondary | ICD-10-CM | POA: Diagnosis not present

## 2016-12-18 NOTE — Progress Notes (Signed)
Subjective:    Patient ID: James Mcclain, male    DOB: 05/09/1945, 71 y.o.   MRN: [id number]  HPI  12/08/16 Patient is a 32 AAM with history of type II diabetes, HTN, CKD, paroxysmal atrial fibrillation, CVA, and CAD s/p PCI presents for discussion of anticoagulation.  Left heart cath in 6/14 showed 90% proximal LAD stenosis.  This was treated with Xience DES.  In 8/15, he had a CVA treated with TPA.  Event monitor in 8/15 did not show atrial fibrillation.  Loop recorder was placed which revealed atrial fibrillation in 07/2015.  He is on eliquis 5 mg pobid but the medicine has been costing him 150 dollars every month and he stopped the medicine because he could not afford it.  He is here today to discuss other options.  AT that time, my plan was: I recommended that the patient call his insurance and check to see if pradaxa or xarelto would be more affordable.  If so, I will switch to the affordable option.  If not, start coumadin 5 mg poqday and recheck INR in 1 week.  12/16/16 Patient ultimately elected to start Coumadin 5 mg every day as he cannot afford the other medication.  He is here today to recheck his INR.  He has been taking the Coumadin now for 7 days.  His INR today in clinic is 1.9.  He denies any bleeding or bruising.   At that time, my plan was: His INR today is 1.9 and is almost therapeutic.  The patient just started Coumadin and therefore his INR should still be trending upwards.  Hence I will not increase his Coumadin today but I will recheck a level in 2 weeks.  If therapeutic at that time, I will change the patient's regimen to every 4 weeks.  We will continue Coumadin 5 mg every day  12/18/16 This morning, patient experienced bright red blood per rectum.  He denies any pain with defecation although he has been experiencing some perirectal itching for the last few days.  On examination today, the patient does have an external hemorrhoid at 3:00 with the patient leaning over the exam  table.  There is no obvious bleeding from this.  On rectal exam, there are no palpable rectal masses.  There is no blood on my gloved finger after the exam.  Patient had a colonoscopy in December 2017 that did show arteriovenous malformations as well as diverticulosis which could also be a possible source Past Medical History:  Diagnosis Date  . Arthritis   . Coronary artery disease    a. s/p Xience DES to the LAD 07/2012.  . Dilated aortic root ([place])    a. Mildly dilated by echo 09/2013, f/u MRA scheduled for 12/2014.  [name] Elevated PSA   . Essential hypertension   . History of kidney stones   . Hx of gout   . Hyperlipidemia   . Insulin dependent diabetes mellitus ([place])   . 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 On tele 09/2013.  [name] Paroxysmal atrial fibrillation (HCC)   . PFO (patent foramen ovale)    a. By echo 09/2013.  [name] Polycythemia   . Prostate cancer ([place]) 07/15/12   Gleason 7, external beam radiation Tx, Dr. Pilar Jarvis  . PVC's (premature ventricular contractions)   . Stroke [facility])    a. Cryptogenic, 09/2013, event monitor with NSR. Small PFO noted on echo but neg LE duplex.   Past Surgical History:  Procedure Laterality Date  .  COLONOSCOPY WITH PROPOFOL N/A 02/22/2016   Procedure: COLONOSCOPY WITH PROPOFOL;  Surgeon: Carol Ada, MD;  [facility];  Service: Endoscopy;  Laterality: N/A;  . CORONARY ANGIOPLASTY WITH STENT PLACEMENT  08/05/2012   LAD, 1 stent  . EP IMPLANTABLE DEVICE N/A 08/23/2014   Procedure: Loop Recorder Insertion;  Surgeon: Evans Lance, MD;  Location: [place] CV LAB;  Service: Cardiovascular;  Laterality: N/A;  . KIDNEY STONES     2006 - LITHOTRIPSY  . PERCUTANEOUS CORONARY STENT INTERVENTION (PCI-S) N/A 08/05/2012   Procedure: PERCUTANEOUS CORONARY STENT INTERVENTION (PCI-S);  Surgeon: Sherren Mocha, MD;  Location: [facility] CATH LAB;  Service: Cardiovascular;  Laterality: N/A;  . PROSTATE BIOPSY  07/15/2012  . PROSTATE BIOPSY  09/23/2013  . TEE  WITHOUT CARDIOVERSION N/A 09/27/2013   Procedure: TRANSESOPHAGEAL ECHOCARDIOGRAM (TEE);  Surgeon: Larey Dresser, MD;  Location: [place];  Service: Cardiovascular;  Laterality: N/A;  . TONSILLECTOMY     as adult  . TOTAL KNEE ARTHROPLASTY  01/24/2011   Procedure: TOTAL KNEE ARTHROPLASTY;  Surgeon: Alta Corning;  Location: [place];  Service: Orthopedics;  Laterality: Left;  COMPUTER ASSISTED LEFT  TOTAL KNEE REPLACEMENT. Anesthesia a combination of regional and general.   Current Outpatient Prescriptions on File Prior to Visit  Medication Sig Dispense Refill  . allopurinol (ZYLOPRIM) 100 MG tablet Take 2 tablets (200 mg total) by mouth daily. 180 tablet 1  . amLODipine (NORVASC) 10 MG tablet Take 1 tablet (10 mg total) by mouth daily. 90 tablet 3  . aspirin 81 MG tablet Take 81 mg by mouth daily. Reported on 04/05/2015    . atorvastatin (LIPITOR) 40 MG tablet Take 1 tablet (40 mg total) by mouth daily. 90 tablet 1  . Blood Glucose Monitoring Suppl (ONETOUCH VERIO) w/Device KIT 1 Device by Does not apply route 2 (two) times daily. E11.9 1 kit 2  . carvedilol (COREG) 12.5 MG tablet Take 1 tablet (12.5 mg total) by mouth 2 (two) times daily. 180 tablet 3  . glucose blood test strip Check BS bid DX: E11.9 100 each 12  . Insulin Glargine (LANTUS SOLOSTAR) 100 UNIT/ML Solostar Pen Inject 45 Units into the skin daily. 15 mL 3  . Insulin Syringe-Needle U-100 (SAFETY INSULIN SYRINGES) 30G X 5/16" 0.5 ML MISC As needed for insulin 100 each 11  . losartan-hydrochlorothiazide (HYZAAR) 100-25 MG tablet Take 1 tablet by mouth daily. 90 tablet 3  . metFORMIN (GLUCOPHAGE) 500 MG tablet TAKE TWO TABLETS BY MOUTH TWICE DAILY WITH  A  MEAL 360 tablet 3  . warfarin (COUMADIN) 5 MG tablet Take 1 tablet (5 mg total) by mouth daily. 30 tablet 3   No current facility-administered medications on file prior to visit.    Allergies  Allergen Reactions  . Betadine [Povidone Iodine] Other (See Comments)    Patient  doesn't remember  . Contrast Media [Iodinated Diagnostic Agents] Other (See Comments)    Patient doesn't remember  . Fish Allergy Swelling   Social History   Social History  . Marital status: Single    Spouse name: N/A  . Number of children: 3  . Years of education: 12th   Occupational History  . retired    Social History Main Topics  . Smoking status: Never Smoker  . Smokeless tobacco: Never Used  . Alcohol use No  . Drug use: No  . Sexual activity: Not Currently   Other Topics Concern  . Not on file   Social History Narrative  [name]   Patient is right handed   Patient drinks coffee and sodas      Review of Systems  All other systems reviewed and are negative.      Objective:   Physical Exam  Constitutional: He is oriented to person, place, and time. He appears well-developed and well-nourished.  Cardiovascular: Normal rate, regular rhythm and normal heart sounds.   No murmur heard. Pulmonary/Chest: Effort normal and breath sounds normal. No respiratory distress. He has no wheezes. He has no rales. He exhibits no tenderness.  Genitourinary: Rectal exam shows external hemorrhoid. Rectal exam shows no internal hemorrhoid, no fissure, no mass, no tenderness, anal tone normal and guaiac negative stool.  Neurological: He is alert and oriented to person, place, and time. No cranial nerve deficit. He exhibits normal muscle tone. Coordination normal.  Vitals reviewed.         Assessment & Plan:  .Blood in stool I suspect that the patient is experiencing blood either from the arteriovenous malformation or the external hemorrhoid or possibly diverticulosis.  I recommended he discontinue Coumadin immediately.  If he experiences heavy bleeding, he is to go to the emergency room.  Otherwise I would allow his INR to trend down gradually on its own off the Coumadin.  If he has been free of any other bright red blood per rectum for 1 week, I  would then resume Eliquis 5 mg pobid.

## 2016-12-23 ENCOUNTER — Telehealth: Payer: Self-pay | Admitting: Family Medicine

## 2016-12-23 NOTE — Telephone Encounter (Signed)
Pt came in and dropped off forms to be filled out. Placed in yellow folder.

## 2016-12-24 MED ORDER — APIXABAN 5 MG PO TABS
5.0000 mg | ORAL_TABLET | Freq: Two times a day (BID) | ORAL | 3 refills | Status: DC
Start: 2016-12-24 — End: 2016-12-24

## 2016-12-24 MED ORDER — APIXABAN 5 MG PO TABS: 5.0000 mg | ORAL_TABLET | Freq: Two times a day (BID) | ORAL | 3 refills | Status: DC

## 2016-12-29 NOTE — Telephone Encounter (Signed)
Paper work faxed on 12/26/16

## 2016-12-30 ENCOUNTER — Encounter: Payer: Self-pay | Admitting: Family Medicine

## 2016-12-30 ENCOUNTER — Ambulatory Visit: Payer: PPO | Admitting: Family Medicine

## 2016-12-30 VITALS — BP 130/82 | HR 72 | Temp 98.5°F | Resp 18 | Wt 249.0 lb

## 2016-12-30 DIAGNOSIS — K921 Melena: Secondary | ICD-10-CM

## 2016-12-30 DIAGNOSIS — Z23 Encounter for immunization: Secondary | ICD-10-CM

## 2016-12-30 NOTE — Addendum Note (Signed)
Addended by: Shary Decamp B on: 12/30/2016 10:28 AM   Modules accepted: Orders

## 2016-12-30 NOTE — Telephone Encounter (Signed)
Pt aware paperwork faxed and instructed to call company in a few days to make sure the got the paperwork if they have not contacted him first.

## 2016-12-30 NOTE — Progress Notes (Signed)
Subjective:    Patient ID: James Mcclain, male    DOB: [birth year], 71 y.o.   MRN: [id number]  HPI  12/08/16 Patient is a 42 AAM with history of type II diabetes, HTN, CKD, paroxysmal atrial fibrillation, CVA, and CAD s/p PCI presents for discussion of anticoagulation.  Left heart cath in 6/14 showed 90% proximal LAD stenosis.  This was treated with Xience DES.  In 8/15, he had a CVA treated with TPA.  Event monitor in 8/15 did not show atrial fibrillation.  Loop recorder was placed which revealed atrial fibrillation in 07/2015.  He is on eliquis 5 mg pobid but the medicine has been costing him 150 dollars every month and he stopped the medicine because he could not afford it.  He is here today to discuss other options.  AT that time, my plan was: I recommended that the patient call his insurance and check to see if pradaxa or xarelto would be more affordable.  If so, I will switch to the affordable option.  If not, start coumadin 5 mg poqday and recheck INR in 1 week.  12/16/16 Patient ultimately elected to start Coumadin 5 mg every day as he cannot afford the other medication.  He is here today to recheck his INR.  He has been taking the Coumadin now for 7 days.  His INR today in clinic is 1.9.  He denies any bleeding or bruising.   At that time, my plan was: His INR today is 1.9 and is almost therapeutic.  The patient just started Coumadin and therefore his INR should still be trending upwards.  Hence I will not increase his Coumadin today but I will recheck a level in 2 weeks.  If therapeutic at that time, I will change the patient's regimen to every 4 weeks.  We will continue Coumadin 5 mg every day  12/18/16 This morning, patient experienced bright red blood per rectum.  He denies any pain with defecation although he has been experiencing some perirectal itching for the last few days.  On examination today, the patient does have an external hemorrhoid at 3:00 with the patient leaning over the exam  table.  There is no obvious bleeding from this.  On rectal exam, there are no palpable rectal masses.  There is no blood on my gloved finger after the exam.  Patient had a colonoscopy in December 2017 that did show arteriovenous malformations as well as diverticulosis which could also be a possible source.  At that time, my plan was:  suspect that the patient is experiencing blood either from the arteriovenous malformation or the external hemorrhoid or possibly diverticulosis.  I recommended he discontinue Coumadin immediately.  If he experiences heavy bleeding, he is to go to the emergency room.  Otherwise I would allow his INR to trend down gradually on its own off the Coumadin.  If he has been free of any other bright red blood per rectum for 1 week, I would then resume Eliquis 5 mg pobid.   12/30/16 The patient is here today for recheck.  He has resumed Eliquis 5 mg p.o. twice daily.  He has seen no further episodes of bright red blood per rectum.  He denies any melena.  He denies any stomach pain.  He denies any chest pain or shortness of breath.  He denies any dyspnea on exertion that is new.  He is tolerating the new medication without difficulty and his blood pressure remained stable at 130/82.  From  a symptomatic standpoint, the episode of hematochezia appears to be a self-limited episode.  He is here today to recheck a CBC to ensure that he is not losing blood slowly and unseen. Past Medical History:  Diagnosis Date  . Arthritis   . Coronary artery disease    a. s/p Xience DES to the LAD 07/2012.  . Dilated aortic root ([place])    a. Mildly dilated by echo 09/2013, f/u MRA scheduled for 12/2014.  [name] Elevated PSA   . Essential hypertension   . History of kidney stones   . Hx of gout   . Hyperlipidemia   . Insulin dependent diabetes mellitus ([place])   . 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 On tele 09/2013.  [name] Paroxysmal atrial fibrillation (HCC)   . PFO (patent foramen ovale)    a. By echo  09/2013.  [name] Polycythemia   . Prostate cancer ([place]) 07/15/12   Gleason 7, external beam radiation Tx, Dr. Pilar Jarvis  . PVC's (premature ventricular contractions)   . Stroke [facility])    a. Cryptogenic, 09/2013, event monitor with NSR. Small PFO noted on echo but neg LE duplex.   Past Surgical History:  Procedure Laterality Date  . CORONARY ANGIOPLASTY WITH STENT PLACEMENT  08/05/2012   LAD, 1 stent  . KIDNEY STONES     2006 - LITHOTRIPSY  . PROSTATE BIOPSY  07/15/2012  . PROSTATE BIOPSY  09/23/2013  . TONSILLECTOMY     as adult   Current Outpatient Medications on File Prior to Visit  Medication Sig Dispense Refill  . allopurinol (ZYLOPRIM) 100 MG tablet Take 2 tablets (200 mg total) by mouth daily. 180 tablet 1  . amLODipine (NORVASC) 10 MG tablet Take 1 tablet (10 mg total) by mouth daily. 90 tablet 3  . apixaban (ELIQUIS) 5 MG TABS tablet Take 1 tablet (5 mg total) by mouth 2 (two) times daily. 180 tablet 3  . aspirin 81 MG tablet Take 81 mg by mouth daily. Reported on 04/05/2015    . atorvastatin (LIPITOR) 40 MG tablet Take 1 tablet (40 mg total) by mouth daily. 90 tablet 1  . Blood Glucose Monitoring Suppl (ONETOUCH VERIO) w/Device KIT 1 Device by Does not apply route 2 (two) times daily. E11.9 1 kit 2  . carvedilol (COREG) 12.5 MG tablet Take 1 tablet (12.5 mg total) by mouth 2 (two) times daily. 180 tablet 3  . glucose blood test strip Check BS bid DX: E11.9 100 each 12  . Insulin Glargine (LANTUS SOLOSTAR) 100 UNIT/ML Solostar Pen Inject 45 Units into the skin daily. 15 mL 3  . Insulin Syringe-Needle U-100 (SAFETY INSULIN SYRINGES) 30G X 5/16" 0.5 ML MISC As needed for insulin 100 each 11  . losartan-hydrochlorothiazide (HYZAAR) 100-25 MG tablet Take 1 tablet by mouth daily. 90 tablet 3  . metFORMIN (GLUCOPHAGE) 500 MG tablet TAKE TWO TABLETS BY MOUTH TWICE DAILY WITH  A  MEAL 360 tablet 3   No current facility-administered medications on file prior to visit.    Allergies  Allergen  Reactions  . Betadine [Povidone Iodine] Other (See Comments)    Patient doesn't remember  . Contrast Media [Iodinated Diagnostic Agents] Other (See Comments)    Patient doesn't remember  . Fish Allergy Swelling   Social History   Socioeconomic History  . Marital status: Single    Spouse name: Not on file  . Number of children: 3  . Years of education: 12th  . Highest education level: Not on file  Social Needs  . Financial resource strain: Not on file  . Food insecurity - worry: Not on file  . Food insecurity - inability: Not on file  . Transportation needs - medical: Not on file  . Transportation needs - non-medical: Not on file  Occupational History  . Occupation: retired  Tobacco Use  . Smoking status: Never Smoker  . Smokeless tobacco: Never Used  Substance and Sexual Activity  . Alcohol use: No  . Drug use: No  . Sexual activity: Not Currently  Other Topics Concern  . Not on file  Social History Narrative   [name]   Patient is right handed   Patient drinks coffee and sodas      Review of Systems  All other systems reviewed and are negative.      Objective:   Physical Exam  Constitutional: He is oriented to person, place, and time. He appears well-developed and well-nourished.  Cardiovascular: Normal rate, regular rhythm and normal heart sounds.  No murmur heard. Pulmonary/Chest: Effort normal and breath sounds normal. No respiratory distress. He has no wheezes. He has no rales. He exhibits no tenderness.  Neurological: He is alert and oriented to person, place, and time. No cranial nerve deficit. He exhibits normal muscle tone. Coordination normal.  Vitals reviewed.         Assessment & Plan:  .Blood in stool - Plan: CBC with Differential/Platelet  Clinically the bleeding has subsided.  Patient is back on Eliquis for secondary prevention of stroke secondary to paroxysmal atrial fibrillation despite the fact he is in normal  sinus rhythm today.  I will recheck a CBC today to ensure that there is no decline in his hemoglobin.  If hemoglobin is stable, follow-up as previously scheduled.  Patient received his flu shot today.

## 2016-12-31 LAB — CBC WITH DIFFERENTIAL/PLATELET
Basophils Absolute: 39 cells/uL (ref 0–200)
Basophils Relative: 0.5 %
Eosinophils Absolute: 351 cells/uL (ref 15–500)
Eosinophils Relative: 4.5 %
HCT: 54.1 % — ABNORMAL HIGH (ref 38.5–50.0)
Hemoglobin: 15.8 g/dL (ref 13.2–17.1)
Lymphs Abs: 1490 cells/uL (ref 850–3900)
MCH: 22.5 pg — ABNORMAL LOW (ref 27.0–33.0)
MCHC: 29.2 g/dL — ABNORMAL LOW (ref 32.0–36.0)
MCV: 77.2 fL — ABNORMAL LOW (ref 80.0–100.0)
MPV: 10.2 fL (ref 7.5–12.5)
Monocytes Relative: 9.9 %
Neutro Abs: 5148 cells/uL (ref 1500–7800)
Neutrophils Relative %: 66 %
Platelets: 321 10*3/uL (ref 140–400)
RBC: 7.01 10*6/uL — ABNORMAL HIGH (ref 4.20–5.80)
RDW: 20 % — ABNORMAL HIGH (ref 11.0–15.0)
Total Lymphocyte: 19.1 %
WBC mixed population: 772 cells/uL (ref 200–950)
WBC: 7.8 10*3/uL (ref 3.8–10.8)

## 2017-01-12 ENCOUNTER — Ambulatory Visit (INDEPENDENT_AMBULATORY_CARE_PROVIDER_SITE_OTHER): Payer: PPO | Admitting: *Deleted

## 2017-01-12 DIAGNOSIS — I6389 Other cerebral infarction: Secondary | ICD-10-CM | POA: Diagnosis not present

## 2017-01-13 NOTE — Progress Notes (Signed)
Carelink Summary Report / Loop Recorder 

## 2017-01-19 ENCOUNTER — Telehealth: Payer: Self-pay | Admitting: Family Medicine

## 2017-01-19 MED ORDER — CARVEDILOL 12.5 MG PO TABS: 12.5000 mg | ORAL_TABLET | Freq: Two times a day (BID) | ORAL | 3 refills | Status: DC

## 2017-01-19 MED ORDER — APIXABAN 5 MG PO TABS: 5.0000 mg | ORAL_TABLET | Freq: Two times a day (BID) | ORAL | 3 refills | Status: DC

## 2017-01-19 NOTE — Telephone Encounter (Signed)
Pt needs refill on carvedilol 12.5mg  tab to [facility] [place], pt also needs [place] to call in eliquis to [place], he is out and we did not have any samples to give him of this med.

## 2017-01-19 NOTE — Telephone Encounter (Signed)
Medication called/sent to requested pharmacy  

## 2017-01-22 IMAGING — CR DG CHEST 2V
2 series · 2 of 2 positions shown · non-contrast
Comparison: PA and lateral chest of January 23, 2011

CLINICAL DATA: Nonproductive cough and dyspnea on exertion for the
past month; nonsmoker ; exposed to the influenza at home ; history
of coronary stent placement.

EXAM:
CHEST  2 VIEW

[w chest pa]
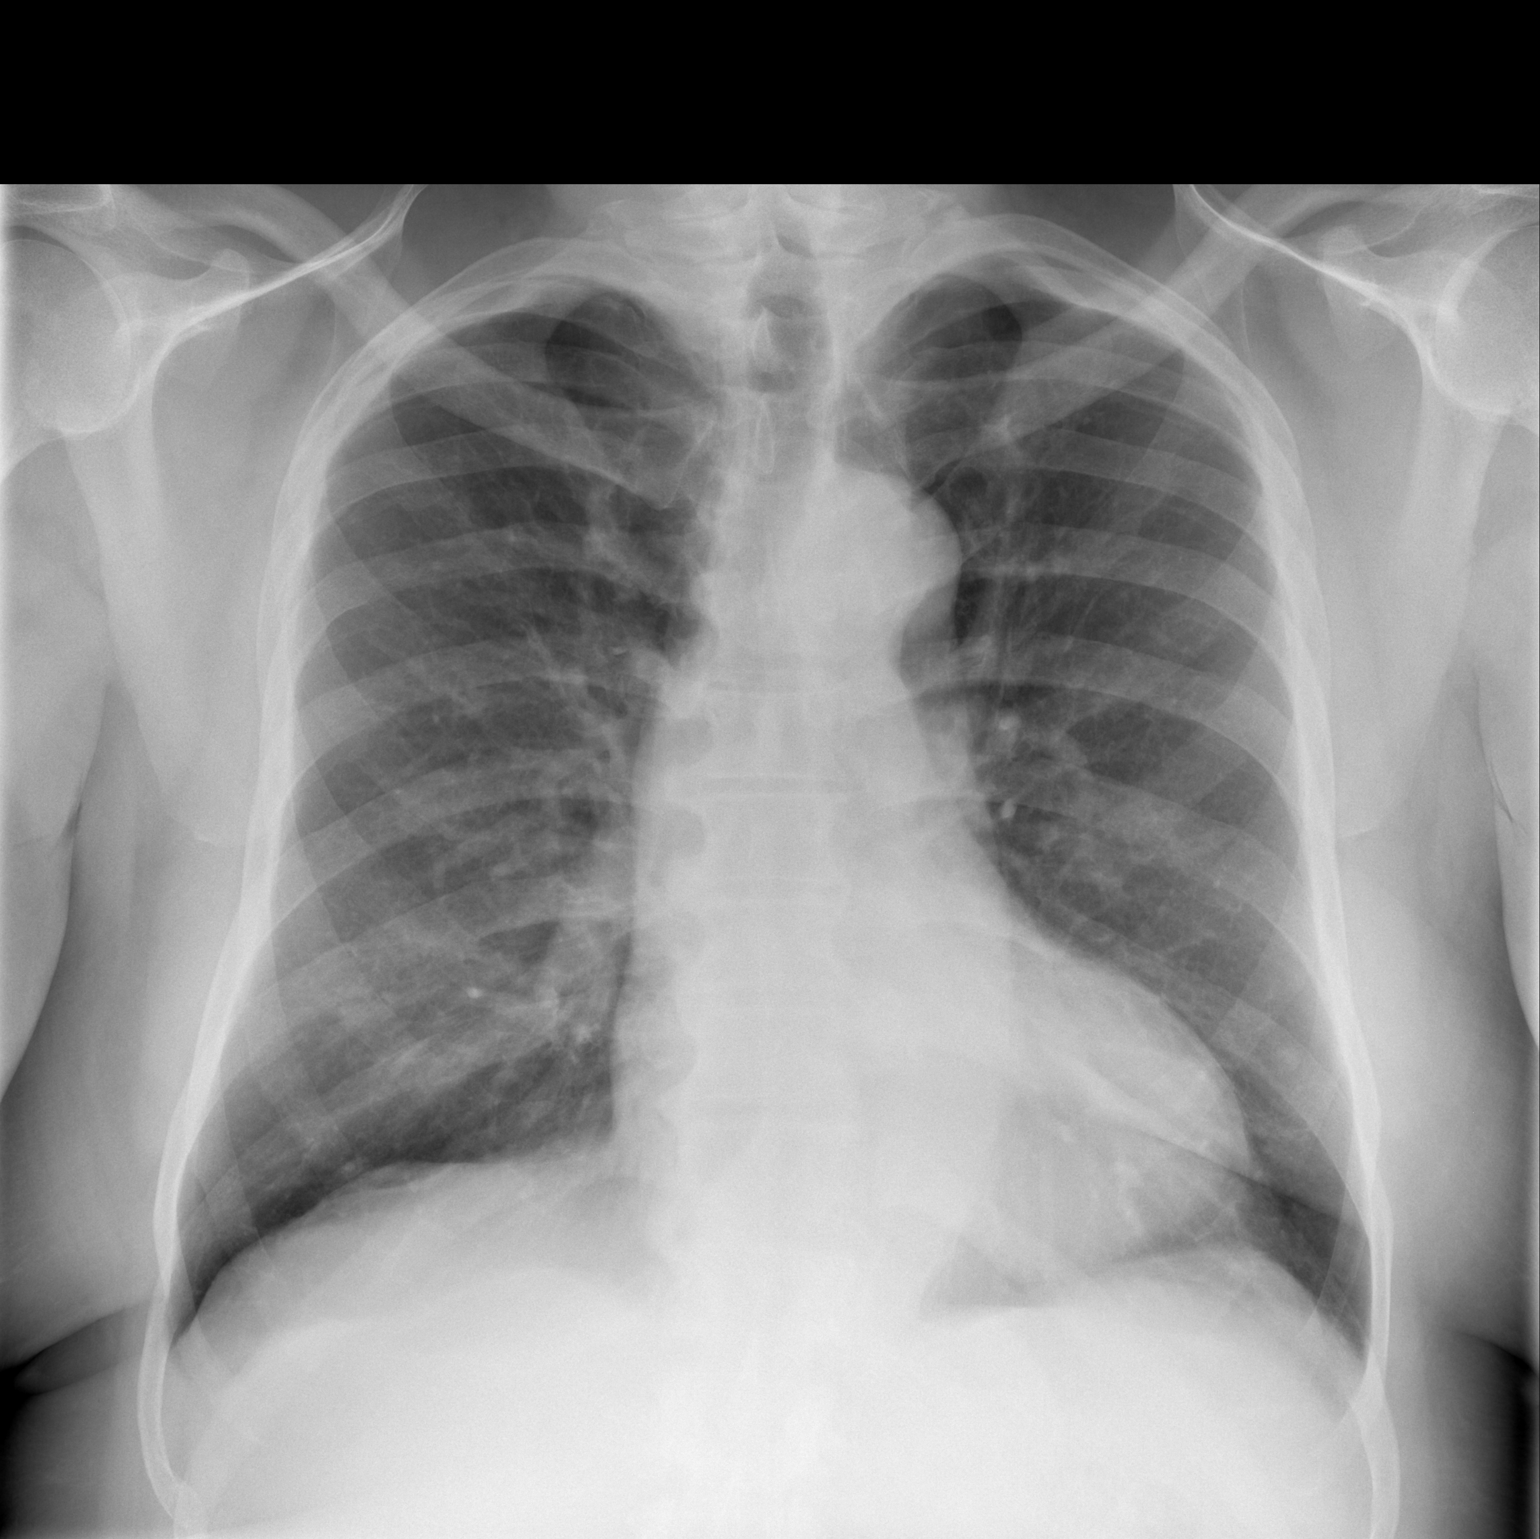

[w chest lat]
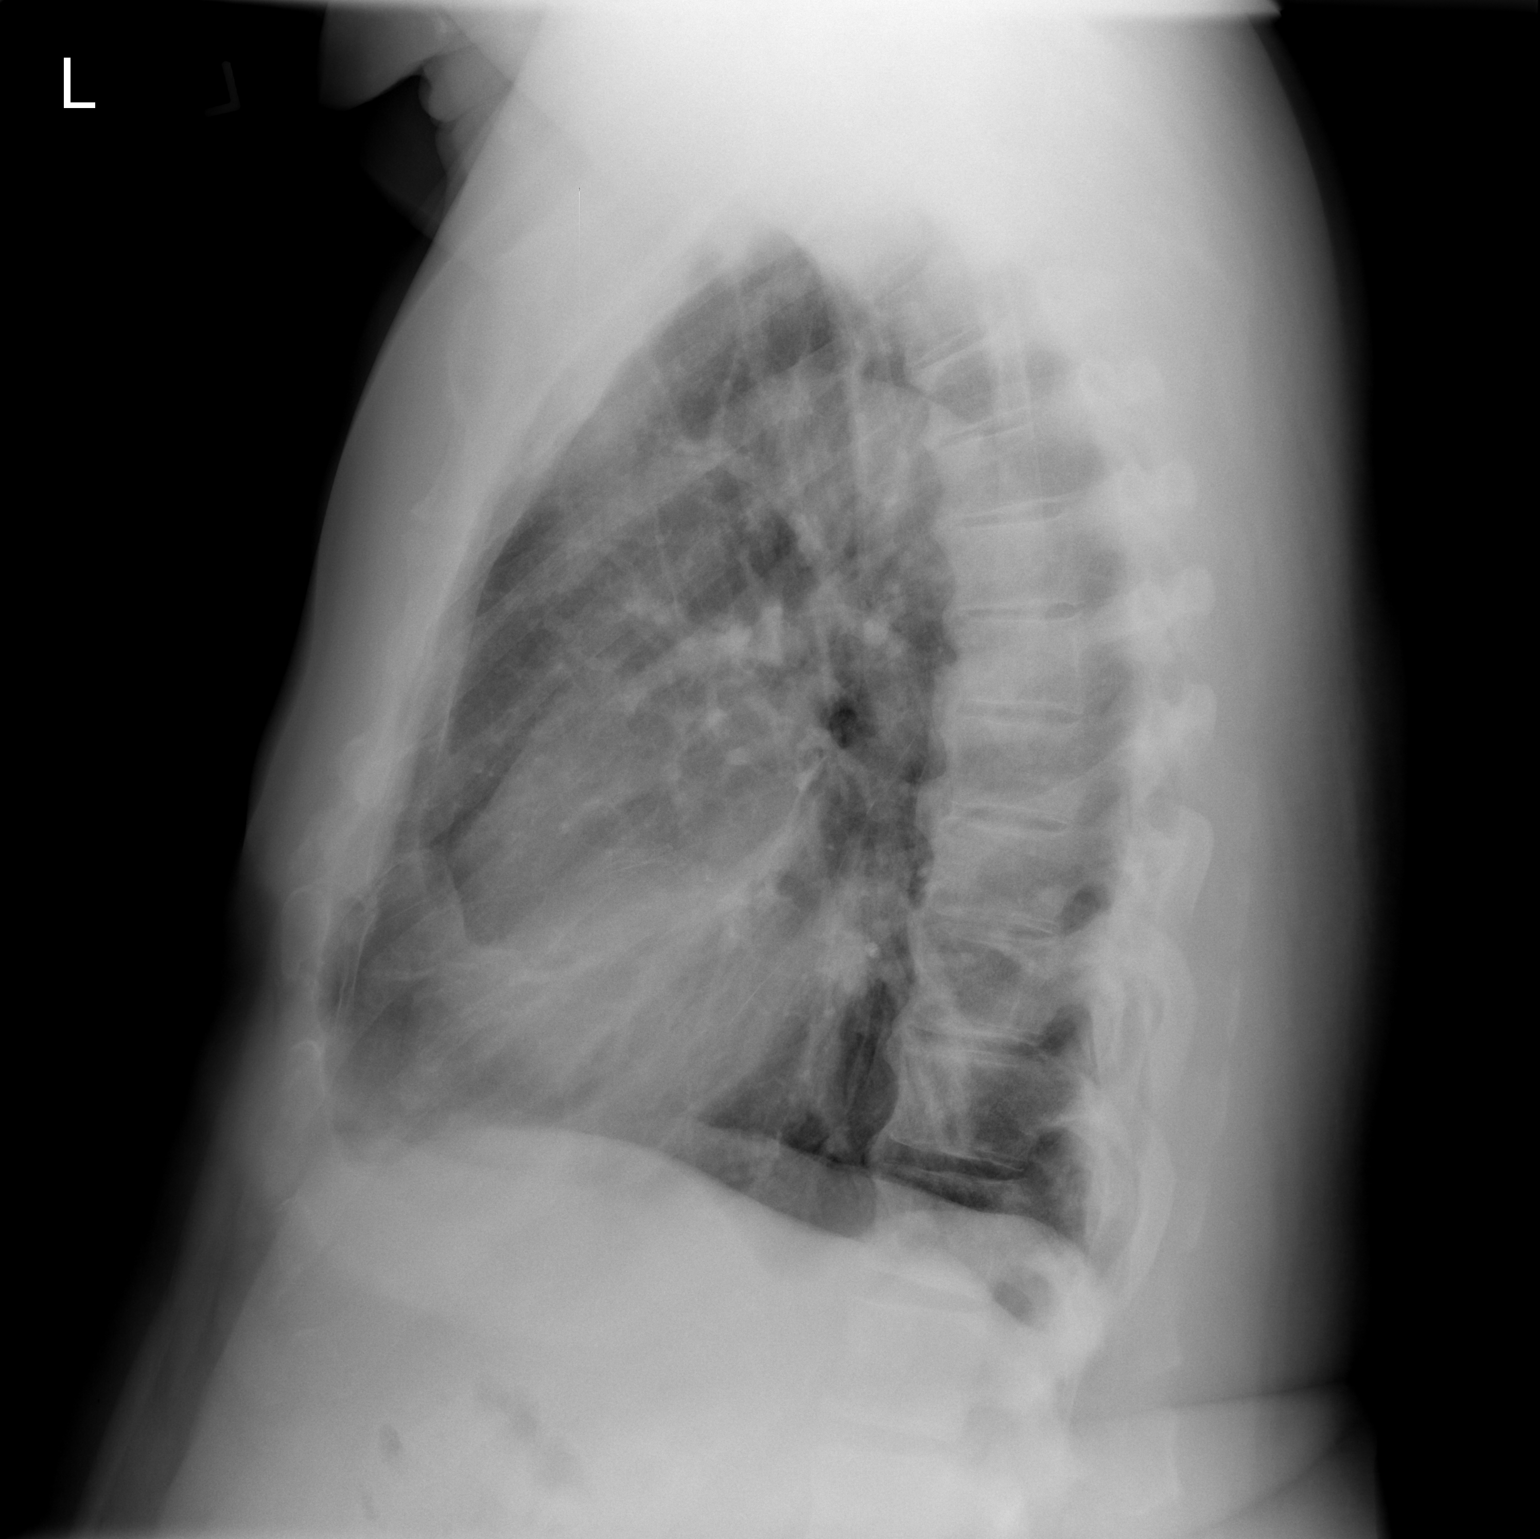

[2 of 2 positions shown; findings below may reference images not displayed]

FINDINGS: The lungs are hyperinflated with hemidiaphragm flattening. The
interstitial markings are coarse. New increased interstitial density
in the right infrahilar region is present. There is stable apical
pleural thickening bilaterally. The cardiac silhouette is top-normal
in size. The central pulmonary vascularity is mildly prominent.
There is tortuosity of the descending thoracic aorta. The trachea is
midline. The bony thorax is unremarkable.
IMPRESSION: COPD. Superimposed acute subsegmental atelectasis or early pneumonia
in the right middle and lower lobes is suspected. Follow-up
radiographs following anticipated antibiotic therapy are recommended
to assure clearing.

## 2017-01-27 ENCOUNTER — Telehealth (HOSPITAL_COMMUNITY): Payer: Self-pay | Admitting: *Deleted

## 2017-01-27 NOTE — Telephone Encounter (Signed)
Approved for patient assistance for Eliquis 01/26/17-02/23/17. Approval #[id number]

## 2017-01-29 LAB — CUP PACEART REMOTE DEVICE CHECK
Date Time Interrogation Session: 20181117151253
Implantable Pulse Generator Implant Date: 20160629

## 2017-02-06 IMAGING — CR DG CHEST 2V
2 series · 2 of 2 positions shown · non-contrast
Comparison: 05/17/2014, 01/23/2011, 03/02/2006.

CLINICAL DATA: Pneumonia.

EXAM:
CHEST  2 VIEW

[w chest pa]
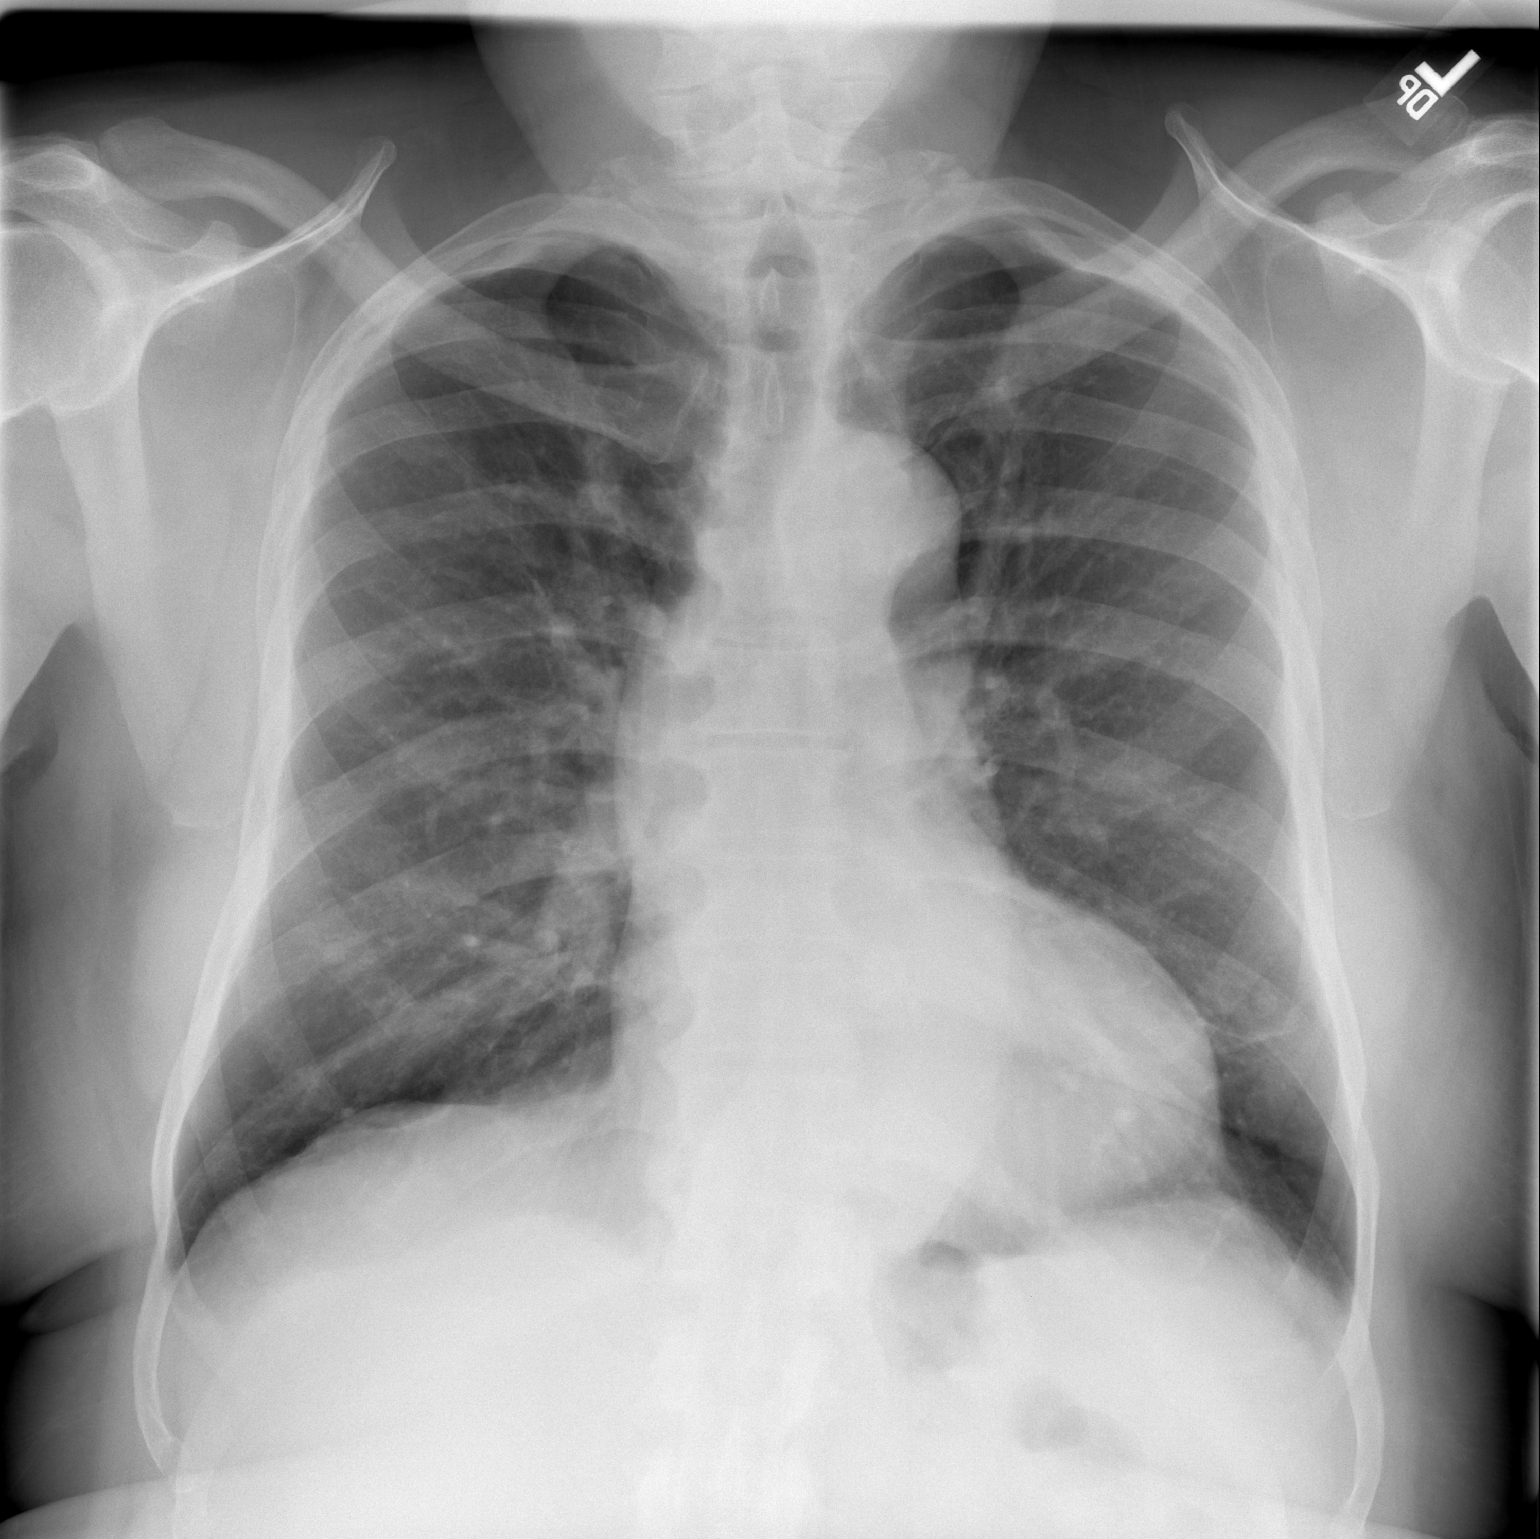

[w chest lat]
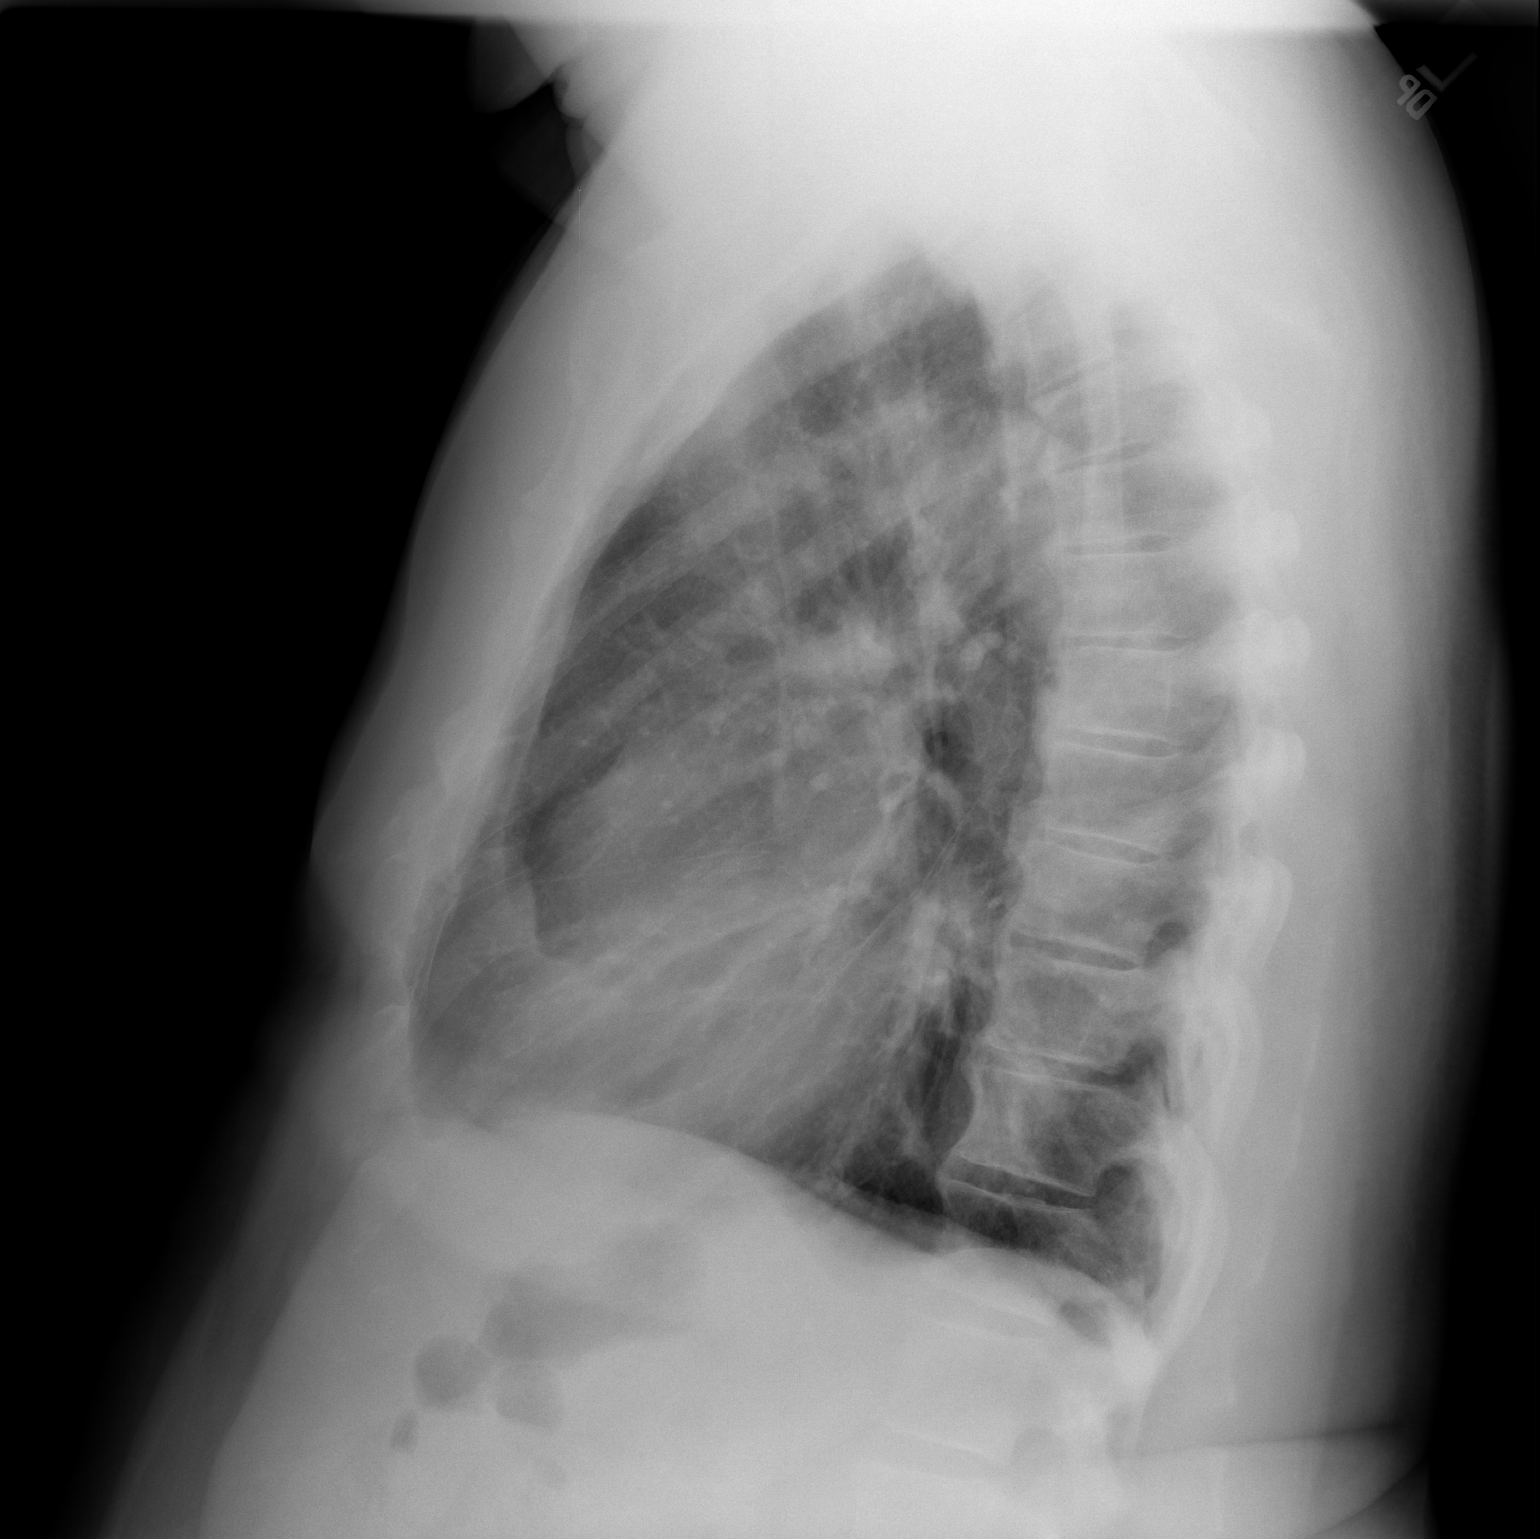

[2 of 2 positions shown; findings below may reference images not displayed]

FINDINGS: Mediastinum and hilar structures normal. Cardiomegaly with normal
pulmonary vascularity. Left base and right middle lobe minimal
atelectasis and/or infiltrates remain. A component of these changes
may be related to scarring. No progressive infiltrate. No pleural
effusion or pneumothorax. No bony abnormality .
IMPRESSION: 1. Left base and right middle lobe minimal subsegmental atelectasis
and/or infiltrates remain. A component of these changes may be
related to scarring. No evidence of progressive infiltrate.

2. Stable cardiomegaly.  No pulmonary venous congestion.

## 2017-02-09 ENCOUNTER — Ambulatory Visit (INDEPENDENT_AMBULATORY_CARE_PROVIDER_SITE_OTHER): Payer: PPO | Admitting: *Deleted

## 2017-02-09 DIAGNOSIS — I6389 Other cerebral infarction: Secondary | ICD-10-CM

## 2017-02-10 NOTE — Progress Notes (Signed)
Carelink Summary Report / Loop Recorder 

## 2017-02-25 LAB — CUP PACEART REMOTE DEVICE CHECK
Date Time Interrogation Session: 20181217151658
Implantable Pulse Generator Implant Date: 20160629

## 2017-02-26 ENCOUNTER — Other Ambulatory Visit: Payer: Self-pay | Admitting: Family Medicine

## 2017-03-04 ENCOUNTER — Telehealth: Payer: Self-pay | Admitting: Family Medicine

## 2017-03-04 NOTE — Telephone Encounter (Signed)
Application faxed to [employer] for pt assistance for [employer]. Faxed to [phone].

## 2017-03-09 ENCOUNTER — Other Ambulatory Visit: Payer: Self-pay | Admitting: Family Medicine

## 2017-03-11 ENCOUNTER — Ambulatory Visit (INDEPENDENT_AMBULATORY_CARE_PROVIDER_SITE_OTHER): Payer: PPO | Admitting: *Deleted

## 2017-03-11 DIAGNOSIS — I6389 Other cerebral infarction: Secondary | ICD-10-CM

## 2017-03-12 NOTE — Progress Notes (Signed)
Carelink Summary Report / Loop Recorder 

## 2017-03-20 LAB — CUP PACEART REMOTE DEVICE CHECK
Date Time Interrogation Session: 20190116154142
Implantable Pulse Generator Implant Date: 20160629

## 2017-03-22 ENCOUNTER — Other Ambulatory Visit: Payer: Self-pay | Admitting: Family Medicine

## 2017-03-27 ENCOUNTER — Telehealth: Payer: Self-pay | Admitting: Family Medicine

## 2017-03-27 MED ORDER — LISINOPRIL-HYDROCHLOROTHIAZIDE 20-12.5 MG PO TABS: 2.0000 | ORAL_TABLET | Freq: Every day | ORAL | 3 refills | Status: DC

## 2017-03-27 NOTE — Telephone Encounter (Signed)
BP med on back order would like something else sent to pharm. Per Dr. Dennard Schaumann change Losartan/HCT to Lisinopril/HCT 20/12.5 2 tabs po qd. Med sent to pharm.

## 2017-04-10 ENCOUNTER — Ambulatory Visit (INDEPENDENT_AMBULATORY_CARE_PROVIDER_SITE_OTHER): Payer: PPO | Admitting: *Deleted

## 2017-04-10 DIAGNOSIS — I6389 Other cerebral infarction: Secondary | ICD-10-CM | POA: Diagnosis not present

## 2017-04-13 NOTE — Progress Notes (Signed)
Carelink Summary Report / Loop Recorder 

## 2017-04-21 ENCOUNTER — Encounter: Payer: Self-pay | Admitting: Family Medicine

## 2017-04-21 ENCOUNTER — Ambulatory Visit (INDEPENDENT_AMBULATORY_CARE_PROVIDER_SITE_OTHER): Payer: Medicare HMO | Admitting: Family Medicine

## 2017-04-21 VITALS — BP 142/74 | HR 76 | Temp 98.2°F | Resp 14 | Ht 70.0 in | Wt 247.0 lb

## 2017-04-21 DIAGNOSIS — H10023 Other mucopurulent conjunctivitis, bilateral: Secondary | ICD-10-CM | POA: Diagnosis not present

## 2017-04-21 MED ORDER — POLYMYXIN B-TRIMETHOPRIM 10000-0.1 UNIT/ML-% OP SOLN: 2.0000 [drp] | OPHTHALMIC | 0 refills | Status: DC

## 2017-04-21 NOTE — Progress Notes (Signed)
Subjective:    Patient ID: James Mcclain, male    DOB: 1945-10-27, 72 y.o.   MRN: [id number]  HPI Patient ahs had pink eye for 3 days.  Occurred simultaneously bilaterally. Denies eye pain or blurry vision.  Denies foreign body sensation in the eye.  Denies eye trauma.  The conjunctiva of both eyes are erythematous with clear exudate.  Fluroscein exam is negative for dendrites or abrasions/ulcerations.  Past Medical History:  Diagnosis Date  . Arthritis   . Coronary artery disease    a. s/p Xience DES to the LAD 07/2012.  . Dilated aortic root ([place])    a. Mildly dilated by echo 09/2013, f/u MRA scheduled for 12/2014.  [name] Elevated PSA   . Essential hypertension   . History of kidney stones   . Hx of gout   . Hyperlipidemia   . Insulin dependent diabetes mellitus ([place])   . 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 On tele 09/2013.  [name] Paroxysmal atrial fibrillation (HCC)   . PFO (patent foramen ovale)    a. By echo 09/2013.  [name] Polycythemia   . Prostate cancer ([place]) 07/15/12   Gleason 7, external beam radiation Tx, Dr. Pilar Jarvis  . PVC's (premature ventricular contractions)   . Stroke [facility])    a. Cryptogenic, 09/2013, event monitor with NSR. Small PFO noted on echo but neg LE duplex.   Past Surgical History:  Procedure Laterality Date  . COLONOSCOPY WITH PROPOFOL N/A 02/22/2016   Procedure: COLONOSCOPY WITH PROPOFOL;  Surgeon: Carol Ada, MD;  Location: [facility];  Service: Endoscopy;  Laterality: N/A;  . CORONARY ANGIOPLASTY WITH STENT PLACEMENT  08/05/2012   LAD, 1 stent  . EP IMPLANTABLE DEVICE N/A 08/23/2014   Procedure: Loop Recorder Insertion;  Surgeon: Evans Lance, MD;  Location: [place] CV LAB;  Service: Cardiovascular;  Laterality: N/A;  . KIDNEY STONES     2006 - LITHOTRIPSY  . PERCUTANEOUS CORONARY STENT INTERVENTION (PCI-S) N/A 08/05/2012   Procedure: PERCUTANEOUS CORONARY STENT INTERVENTION (PCI-S);  Surgeon: Sherren Mocha, MD;  Location: [facility] CATH LAB;  Service:  Cardiovascular;  Laterality: N/A;  . PROSTATE BIOPSY  07/15/2012  . PROSTATE BIOPSY  09/23/2013  . TEE WITHOUT CARDIOVERSION N/A 09/27/2013   Procedure: TRANSESOPHAGEAL ECHOCARDIOGRAM (TEE);  Surgeon: Larey Dresser, MD;  Location: [place];  Service: Cardiovascular;  Laterality: N/A;  . TONSILLECTOMY     as adult  . TOTAL KNEE ARTHROPLASTY  01/24/2011   Procedure: TOTAL KNEE ARTHROPLASTY;  Surgeon: Alta Corning;  Location: [place];  Service: Orthopedics;  Laterality: Left;  COMPUTER ASSISTED LEFT  TOTAL KNEE REPLACEMENT. Anesthesia a combination of regional and general.   Current Outpatient Medications on File Prior to Visit  Medication Sig Dispense Refill  . allopurinol (ZYLOPRIM) 100 MG tablet TAKE TWO TABLETS BY MOUTH ONCE DAILY 60 tablet 5  . amLODipine (NORVASC) 10 MG tablet Take 1 tablet (10 mg total) by mouth daily. 90 tablet 3  . apixaban (ELIQUIS) 5 MG TABS tablet Take 1 tablet (5 mg total) by mouth 2 (two) times daily. 180 tablet 3  . aspirin 81 MG tablet Take 81 mg by mouth daily. Reported on 04/05/2015    . atorvastatin (LIPITOR) 40 MG tablet Take 1 tablet (40 mg total) by mouth daily. 90 tablet 1  . atorvastatin (LIPITOR) 40 MG tablet TAKE ONE TABLET BY MOUTH ONCE DAILY 90 tablet 1  . Blood Glucose Monitoring Suppl (ONETOUCH VERIO) w/Device KIT 1 Device by Does  not apply route 2 (two) times daily. E11.9 1 kit 2  . carvedilol (COREG) 12.5 MG tablet Take 1 tablet (12.5 mg total) by mouth 2 (two) times daily. 180 tablet 3  . glucose blood test strip Check BS bid DX: E11.9 100 each 12  . Insulin Glargine (LANTUS SOLOSTAR) 100 UNIT/ML Solostar Pen Inject 45 Units into the skin daily. 15 mL 3  . Insulin Syringe-Needle U-100 (SAFETY INSULIN SYRINGES) 30G X 5/16" 0.5 ML MISC As needed for insulin 100 each 11  . lisinopril-hydrochlorothiazide (ZESTORETIC) 20-12.5 MG tablet Take 2 tablets by mouth daily. 180 tablet 3  . metFORMIN (GLUCOPHAGE) 500 MG tablet TAKE TWO TABLETS BY MOUTH TWICE  DAILY WITH  A  MEAL 360 tablet 3   No current facility-administered medications on file prior to visit.    Allergies  Allergen Reactions  . Betadine [Povidone Iodine] Other (See Comments)    Patient doesn't remember  . Contrast Media [Iodinated Diagnostic Agents] Other (See Comments)    Patient doesn't remember  . Fish Allergy Swelling   Social History   Socioeconomic History  . Marital status: Single    Spouse name: Not on file  . Number of children: 3  . Years of education: 12th  . Highest education level: Not on file  Social Needs  . Financial resource strain: Not on file  . Food insecurity - worry: Not on file  . Food insecurity - inability: Not on file  . Transportation needs - medical: Not on file  . Transportation needs - non-medical: Not on file  Occupational History  . Occupation: retired  Tobacco Use  . Smoking status: Never Smoker  . Smokeless tobacco: Never Used  Substance and Sexual Activity  . Alcohol use: No  . Drug use: No  . Sexual activity: Not Currently  Other Topics Concern  . Not on file  Social History Narrative   [name]   Patient is right handed   Patient drinks coffee and sodas      Review of Systems  All other systems reviewed and are negative.      Objective:   Physical Exam  Eyes: Pupils are equal, round, and reactive to light. Right eye exhibits discharge. Left eye exhibits discharge. Left eye exhibits no exudate. Right conjunctiva is injected. Left conjunctiva is injected.  Slit lamp exam:      The right eye shows no corneal abrasion, no corneal ulcer and no foreign body.       The left eye shows no corneal abrasion, no corneal ulcer and no foreign body.  Cardiovascular: Normal rate, regular rhythm and normal heart sounds.  Pulmonary/Chest: Effort normal and breath sounds normal.  Vitals reviewed.         Assessment & Plan:  Pink eye disease of both eyes  Likely viral.  Recommended tincture  of time.  If purulent exudate develops, begin polytrim drops.

## 2017-04-28 ENCOUNTER — Telehealth (HOSPITAL_COMMUNITY): Payer: Self-pay | Admitting: *Deleted

## 2017-04-28 NOTE — Telephone Encounter (Signed)
Patient assistance for eliquis approved through 02/23/18. Case number [id number]

## 2017-05-11 LAB — CUP PACEART REMOTE DEVICE CHECK
Date Time Interrogation Session: 20190215164030
Implantable Pulse Generator Implant Date: 20160629

## 2017-05-13 ENCOUNTER — Ambulatory Visit (INDEPENDENT_AMBULATORY_CARE_PROVIDER_SITE_OTHER): Payer: PPO | Admitting: *Deleted

## 2017-05-13 DIAGNOSIS — I6389 Other cerebral infarction: Secondary | ICD-10-CM

## 2017-05-14 NOTE — Progress Notes (Signed)
Carelink Summary Report / Loop Recorder 

## 2017-06-11 ENCOUNTER — Other Ambulatory Visit: Payer: Self-pay

## 2017-06-11 MED ORDER — "INSULIN SYRINGE-NEEDLE U-100 30G X 5/16"" 0.5 ML MISC": 11 refills | Status: AC

## 2017-06-15 ENCOUNTER — Ambulatory Visit (INDEPENDENT_AMBULATORY_CARE_PROVIDER_SITE_OTHER): Payer: PPO | Admitting: *Deleted

## 2017-06-15 ENCOUNTER — Other Ambulatory Visit: Payer: Self-pay | Admitting: Family Medicine

## 2017-06-15 ENCOUNTER — Encounter: Payer: Self-pay | Admitting: Family Medicine

## 2017-06-15 ENCOUNTER — Ambulatory Visit (INDEPENDENT_AMBULATORY_CARE_PROVIDER_SITE_OTHER): Payer: Medicare HMO | Admitting: Family Medicine

## 2017-06-15 VITALS — BP 132/74 | HR 80 | Temp 98.7°F | Resp 16 | Ht 70.0 in | Wt 246.0 lb

## 2017-06-15 DIAGNOSIS — R5382 Chronic fatigue, unspecified: Secondary | ICD-10-CM

## 2017-06-15 DIAGNOSIS — Z125 Encounter for screening for malignant neoplasm of prostate: Secondary | ICD-10-CM | POA: Diagnosis not present

## 2017-06-15 DIAGNOSIS — I6389 Other cerebral infarction: Secondary | ICD-10-CM

## 2017-06-15 NOTE — Progress Notes (Signed)
Subjective:    Patient ID: James Mcclain, male    DOB: 1945/09/03, 72 y.o.   MRN: [id number]  HPI  08/2016 Patient reports severe fatigue. He states that he just doesn't have any energy. Whenever he goes to try to exercise, he feels extremely weak and tired. He denies any chest pain. He denies any shortness of breath. He denies any dyspnea on exertion. He denies any orthopnea. He does occasionally have lightheadedness. He denies any weight loss. He is 248 pounds today which is the same as his weight in April. He denies any melena or hematochezia. He denies any dysuria or hematuria. He denies any rash or abdominal pain. He does have a history of prostate cancer status post prostate radiation therapy. He has a history of low testosterone but he is unable to take testosterone replacement due to his history of prostate cancer. This could be contributing to his fatigue. He is also on numerous antihypertensive medications at time his blood pressure is low at home and he is having some lightheadedness. For instance he is taking Cardura which could be contributing to some of the symptoms. He is due for lab work.  At that time, my plan was: I suspect that his chronic fatigue is secondary to hypogonadism. However we discussed that he cannot take testosterone replacement. I will check a testosterone level to confirm a suspicion. I'll also check a vitamin B12 level and as well as a TSH. Also check a CBC, CMP, hemoglobin A1c, and a sedimentation rate. Discontinue Cardura. Try over-the-counter vitamin B12 supplement 1000 g by mouth daily.  06/15/17 Since the patient's visit in July of last year, he has lost 2 pounds.  He continues to complain of chronic fatigue.  He denies any chest pain.  He denies any shortness of breath.  He denies any hemoptysis.  He denies any night sweats or fevers.  He denies any abdominal pain.  He denies any melena or hematochezia.  He denies any rashes.  He denies any bruising.  He denies any  fevers or chills.  He denies any dysuria.  Lab work in July of last year was significant only for a mildly suppressed testosterone level of 207.  However the patient cannot have treatment for this due to his history of prostate cancer.  He denies any hypersomnolence.  He denies any apnea episodes at night that wake him up.  However he does report feeling sleepy during the day.  He can fall asleep easily as a passenger in a car.  He states that he can fall asleep after eating meals.  He reads seldom but he could fall asleep with reading or watching TV. Past Medical History:  Diagnosis Date  . Arthritis   . Coronary artery disease    a. s/p Xience DES to the LAD 07/2012.  . Dilated aortic root ([place])    a. Mildly dilated by echo 09/2013, f/u MRA scheduled for 12/2014.  [name] Elevated PSA   . Essential hypertension   . History of kidney stones   . Hx of gout   . Hyperlipidemia   . Insulin dependent diabetes mellitus ([place])   . 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 On tele 09/2013.  [name] Paroxysmal atrial fibrillation (HCC)   . PFO (patent foramen ovale)    a. By echo 09/2013.  [name] Polycythemia   . Prostate cancer ([place]) 07/15/12   Gleason 7, external beam radiation Tx, Dr. Pilar Jarvis  . PVC's (premature ventricular contractions)   .  Stroke [facility])    a. Cryptogenic, 09/2013, event monitor with NSR. Small PFO noted on echo but neg LE duplex.   Past Surgical History:  Procedure Laterality Date  . COLONOSCOPY WITH PROPOFOL N/A 02/22/2016   Procedure: COLONOSCOPY WITH PROPOFOL;  Surgeon: Carol Ada, MD;  Location: [facility];  Service: Endoscopy;  Laterality: N/A;  . CORONARY ANGIOPLASTY WITH STENT PLACEMENT  08/05/2012   LAD, 1 stent  . EP IMPLANTABLE DEVICE N/A 08/23/2014   Procedure: Loop Recorder Insertion;  Surgeon: Evans Lance, MD;  Location: [place] CV LAB;  Service: Cardiovascular;  Laterality: N/A;  . KIDNEY STONES     2006 - LITHOTRIPSY  . PERCUTANEOUS CORONARY STENT INTERVENTION (PCI-S)  N/A 08/05/2012   Procedure: PERCUTANEOUS CORONARY STENT INTERVENTION (PCI-S);  Surgeon: Sherren Mocha, MD;  Location: [facility] CATH LAB;  Service: Cardiovascular;  Laterality: N/A;  . PROSTATE BIOPSY  07/15/2012  . PROSTATE BIOPSY  09/23/2013  . TEE WITHOUT CARDIOVERSION N/A 09/27/2013   Procedure: TRANSESOPHAGEAL ECHOCARDIOGRAM (TEE);  Surgeon: Larey Dresser, MD;  Location: [place];  Service: Cardiovascular;  Laterality: N/A;  . TONSILLECTOMY     as adult  . TOTAL KNEE ARTHROPLASTY  01/24/2011   Procedure: TOTAL KNEE ARTHROPLASTY;  Surgeon: Alta Corning;  Location: [place];  Service: Orthopedics;  Laterality: Left;  COMPUTER ASSISTED LEFT  TOTAL KNEE REPLACEMENT. Anesthesia a combination of regional and general.   Current Outpatient Medications on File Prior to Visit  Medication Sig Dispense Refill  . allopurinol (ZYLOPRIM) 100 MG tablet TAKE TWO TABLETS BY MOUTH ONCE DAILY 60 tablet 5  . amLODipine (NORVASC) 10 MG tablet Take 1 tablet (10 mg total) by mouth daily. 90 tablet 3  . apixaban (ELIQUIS) 5 MG TABS tablet Take 1 tablet (5 mg total) by mouth 2 (two) times daily. 180 tablet 3  . aspirin 81 MG tablet Take 81 mg by mouth daily. Reported on 04/05/2015    . atorvastatin (LIPITOR) 40 MG tablet Take 1 tablet (40 mg total) by mouth daily. 90 tablet 1  . atorvastatin (LIPITOR) 40 MG tablet TAKE ONE TABLET BY MOUTH ONCE DAILY 90 tablet 1  . Blood Glucose Monitoring Suppl (ONETOUCH VERIO) w/Device KIT 1 Device by Does not apply route 2 (two) times daily. E11.9 1 kit 2  . carvedilol (COREG) 12.5 MG tablet Take 1 tablet (12.5 mg total) by mouth 2 (two) times daily. 180 tablet 3  . glucose blood test strip Check BS bid DX: E11.9 100 each 12  . Insulin Glargine (LANTUS SOLOSTAR) 100 UNIT/ML Solostar Pen Inject 45 Units into the skin daily. 15 mL 3  . Insulin Syringe-Needle U-100 (SAFETY INSULIN SYRINGES) 30G X 5/16" 0.5 ML MISC As needed for insulin 100 each 11  . lisinopril-hydrochlorothiazide  (ZESTORETIC) 20-12.5 MG tablet Take 2 tablets by mouth daily. 180 tablet 3  . trimethoprim-polymyxin b (POLYTRIM) ophthalmic solution Place 2 drops into both eyes every 4 (four) hours. 10 mL 0   No current facility-administered medications on file prior to visit.    Allergies  Allergen Reactions  . Betadine [Povidone Iodine] Other (See Comments)    Patient doesn't remember  . Contrast Media [Iodinated Diagnostic Agents] Other (See Comments)    Patient doesn't remember  . Fish Allergy Swelling   Social History   Socioeconomic History  . Marital status: Single    Spouse name: Not on file  . Number of children: 3  . Years of education: 12th  . Highest education level: Not on  file  Occupational History  . Occupation: retired  Scientific laboratory technician  . Financial resource strain: Not on file  . Food insecurity:    Worry: Not on file    Inability: Not on file  . Transportation needs:    Medical: Not on file    Non-medical: Not on file  Tobacco Use  . Smoking status: Never Smoker  . Smokeless tobacco: Never Used  Substance and Sexual Activity  . Alcohol use: No  . Drug use: No  . Sexual activity: Not Currently  Lifestyle  . Physical activity:    Days per week: Not on file    Minutes per session: Not on file  . Stress: Not on file  Relationships  . Social connections:    Talks on phone: Not on file    Gets together: Not on file    Attends religious service: Not on file    Active member of club or organization: Not on file    Attends meetings of clubs or organizations: Not on file    Relationship status: Not on file  . Intimate partner violence:    Fear of current or ex partner: Not on file    Emotionally abused: Not on file    Physically abused: Not on file    Forced sexual activity: Not on file  Other Topics Concern  . Not on file  Social History Narrative   [name]   Patient is right handed   Patient drinks coffee and sodas      Review of  Systems  All other systems reviewed and are negative.      Objective:   Physical Exam  Constitutional: He appears well-developed and well-nourished. No distress.  HENT:  Mouth/Throat: No oropharyngeal exudate.  Neck: Neck supple. No JVD present. No thyromegaly present.  Cardiovascular: Normal rate, regular rhythm and normal heart sounds. Exam reveals no gallop and no friction rub.  No murmur heard. Pulmonary/Chest: Effort normal and breath sounds normal. No respiratory distress. He has no wheezes. He has no rales.  Abdominal: Soft. Bowel sounds are normal. He exhibits no distension. There is no tenderness. There is no rebound and no guarding.  Musculoskeletal: He exhibits no edema.  Lymphadenopathy:    He has no cervical adenopathy.  Skin: No rash noted. He is not diaphoretic.  Vitals reviewed.         Assessment & Plan:  Chronic fatigue - Plan: CBC with Differential/Platelet, COMPLETE METABOLIC PANEL WITH GFR, PSA, Cortisol, DG Chest 2 View  Patient's initial lab work was reassuring.  He has had the symptoms now for 9 months with no significant change in weight or physical exam findings.  This leads me to believe that there is not likely a serious underlying malignancy or infection causing his symptoms.  I will check a cortisol level to evaluate for adrenal insufficiency.  I will repeat his lab work including a CBC, CMP.  I will also obtain a baseline chest x-ray to look for lymphadenopathy in the chest or masses in the lungs.  If lab work above is normal, I would recommend a sleep study to evaluate for obstructive sleep apnea as a potential cause of his fatigue.  I explained to the patient that I cannot treat his testosterone deficiency due to his history of prostate cancer.  Patient has not had a PSA checked in more than a year and therefore I will obtain that today while checking lab work

## 2017-06-16 ENCOUNTER — Other Ambulatory Visit: Payer: Self-pay | Admitting: Family Medicine

## 2017-06-16 DIAGNOSIS — R5382 Chronic fatigue, unspecified: Secondary | ICD-10-CM

## 2017-06-16 NOTE — Progress Notes (Signed)
Carelink Summary Report / Loop Recorder 

## 2017-06-17 LAB — CBC WITH DIFFERENTIAL/PLATELET
Basophils Absolute: 65 cells/uL (ref 0–200)
Basophils Relative: 0.6 %
Eosinophils Absolute: 443 cells/uL (ref 15–500)
Eosinophils Relative: 4.1 %
HCT: 55.9 % — ABNORMAL HIGH (ref 38.5–50.0)
Hemoglobin: 16.7 g/dL (ref 13.2–17.1)
Lymphs Abs: 1631 cells/uL (ref 850–3900)
MCH: 22.1 pg — ABNORMAL LOW (ref 27.0–33.0)
MCHC: 29.9 g/dL — ABNORMAL LOW (ref 32.0–36.0)
MCV: 73.9 fL — ABNORMAL LOW (ref 80.0–100.0)
MPV: 9.6 fL (ref 7.5–12.5)
Monocytes Relative: 7.9 %
Neutro Abs: 7808 cells/uL — ABNORMAL HIGH (ref 1500–7800)
Neutrophils Relative %: 72.3 %
Platelets: 360 10*3/uL (ref 140–400)
RBC: 7.56 10*6/uL — ABNORMAL HIGH (ref 4.20–5.80)
RDW: 21.4 % — ABNORMAL HIGH (ref 11.0–15.0)
Total Lymphocyte: 15.1 %
WBC mixed population: 853 cells/uL (ref 200–950)
WBC: 10.8 10*3/uL (ref 3.8–10.8)

## 2017-06-17 LAB — PSA: PSA: 0.6 ng/mL (ref <=4.0)

## 2017-06-17 LAB — COMPLETE METABOLIC PANEL WITH GFR
AG Ratio: 1.2 (calc) (ref 1.0–2.5)
ALT: 9 U/L (ref 9–46)
AST: 15 U/L (ref 10–35)
Albumin: 4 g/dL (ref 3.6–5.1)
Alkaline phosphatase (APISO): 77 U/L (ref 40–115)
BUN/Creatinine Ratio: 19 (calc) (ref 6–22)
BUN: 29 mg/dL — ABNORMAL HIGH (ref 7–25)
CO2: 19 mmol/L — ABNORMAL LOW (ref 20–32)
Calcium: 9.4 mg/dL (ref 8.6–10.3)
Chloride: 109 mmol/L (ref 98–110)
Creat: 1.55 mg/dL — ABNORMAL HIGH (ref 0.70–1.18)
GFR, Est African American: 52 mL/min/{1.73_m2} — ABNORMAL LOW (ref >=60)
GFR, Est Non African American: 45 mL/min/{1.73_m2} — ABNORMAL LOW (ref >=60)
Globulin: 3.3 g/dL (calc) (ref 1.9–3.7)
Glucose, Bld: 119 mg/dL — ABNORMAL HIGH (ref 65–99)
Potassium: 5.3 mmol/L (ref 3.5–5.3)
Sodium: 149 mmol/L — ABNORMAL HIGH (ref 135–146)
Total Bilirubin: 0.6 mg/dL (ref 0.2–1.2)
Total Protein: 7.3 g/dL (ref 6.1–8.1)

## 2017-06-17 LAB — CORTISOL: Cortisol, Plasma: 8.8 ug/dL

## 2017-06-21 LAB — CUP PACEART REMOTE DEVICE CHECK
Date Time Interrogation Session: 20190320193943
Implantable Pulse Generator Implant Date: 20160629

## 2017-06-23 ENCOUNTER — Other Ambulatory Visit: Payer: Self-pay | Admitting: Family Medicine

## 2017-06-23 DIAGNOSIS — R944 Abnormal results of kidney function studies: Secondary | ICD-10-CM

## 2017-06-23 MED ORDER — LISINOPRIL 40 MG PO TABS: 40.0000 mg | ORAL_TABLET | Freq: Every day | ORAL | 3 refills | Status: DC

## 2017-07-06 ENCOUNTER — Other Ambulatory Visit: Payer: Medicare HMO

## 2017-07-06 DIAGNOSIS — R944 Abnormal results of kidney function studies: Secondary | ICD-10-CM | POA: Diagnosis not present

## 2017-07-07 ENCOUNTER — Encounter: Payer: Self-pay | Admitting: Family Medicine

## 2017-07-07 ENCOUNTER — Other Ambulatory Visit: Payer: Self-pay | Admitting: Family Medicine

## 2017-07-07 DIAGNOSIS — N183 Chronic kidney disease, stage 3 unspecified: Secondary | ICD-10-CM | POA: Insufficient documentation

## 2017-07-07 LAB — BASIC METABOLIC PANEL
BUN/Creatinine Ratio: 14 (calc) (ref 6–22)
BUN: 22 mg/dL (ref 7–25)
CO2: 28 mmol/L (ref 20–32)
Calcium: 9.2 mg/dL (ref 8.6–10.3)
Chloride: 103 mmol/L (ref 98–110)
Creat: 1.6 mg/dL — ABNORMAL HIGH (ref 0.70–1.18)
Glucose, Bld: 212 mg/dL — ABNORMAL HIGH (ref 65–99)
Potassium: 5.5 mmol/L — ABNORMAL HIGH (ref 3.5–5.3)
Sodium: 141 mmol/L (ref 135–146)

## 2017-07-07 LAB — EXTRA LAV TOP TUBE

## 2017-07-13 LAB — CUP PACEART REMOTE DEVICE CHECK
Date Time Interrogation Session: 20190422201102
Implantable Pulse Generator Implant Date: 20160629

## 2017-07-21 ENCOUNTER — Ambulatory Visit (INDEPENDENT_AMBULATORY_CARE_PROVIDER_SITE_OTHER): Payer: PPO | Admitting: *Deleted

## 2017-07-21 ENCOUNTER — Telehealth: Payer: Self-pay | Admitting: Family Medicine

## 2017-07-21 DIAGNOSIS — I6389 Other cerebral infarction: Secondary | ICD-10-CM | POA: Diagnosis not present

## 2017-07-21 NOTE — Progress Notes (Signed)
Carelink Summary Report / Loop Recorder 

## 2017-07-21 NOTE — Telephone Encounter (Signed)
Medical clearance form dropped off placed into yellow folder.

## 2017-07-28 NOTE — Telephone Encounter (Signed)
Form completed by Dr. Buelah Manis on 07/27/17 and faxed back to [place]

## 2017-08-04 ENCOUNTER — Telehealth: Payer: Self-pay | Admitting: Family Medicine

## 2017-08-04 NOTE — Telephone Encounter (Signed)
Pt called [facility] 08/03/17 stating that his BS & BP was elevated. Tried to call pt back today to inquire and had to [facility].

## 2017-08-05 ENCOUNTER — Ambulatory Visit: Payer: Medicare HMO | Admitting: Neurology

## 2017-08-05 ENCOUNTER — Encounter: Payer: Self-pay | Admitting: Neurology

## 2017-08-05 VITALS — BP 164/95 | HR 88 | Ht 70.0 in | Wt 245.0 lb

## 2017-08-05 DIAGNOSIS — E291 Testicular hypofunction: Secondary | ICD-10-CM

## 2017-08-05 DIAGNOSIS — I10 Essential (primary) hypertension: Secondary | ICD-10-CM | POA: Diagnosis not present

## 2017-08-05 DIAGNOSIS — R530 Neoplastic (malignant) related fatigue: Secondary | ICD-10-CM

## 2017-08-05 DIAGNOSIS — E1122 Type 2 diabetes mellitus with diabetic chronic kidney disease: Secondary | ICD-10-CM | POA: Diagnosis not present

## 2017-08-05 DIAGNOSIS — Z794 Long term (current) use of insulin: Secondary | ICD-10-CM

## 2017-08-05 DIAGNOSIS — N182 Chronic kidney disease, stage 2 (mild): Secondary | ICD-10-CM

## 2017-08-05 DIAGNOSIS — Z87898 Personal history of other specified conditions: Secondary | ICD-10-CM

## 2017-08-05 NOTE — Patient Instructions (Signed)

## 2017-08-05 NOTE — Progress Notes (Signed)
SLEEP MEDICINE CLINIC   Provider:  Larey Seat, [place] D  Primary Care Physician:  Susy Frizzle, MD   Referring Provider: Susy Frizzle, MD    Chief Complaint  Patient presents with  . New Patient (Initial Visit)    pt alone, rm 11. pt has been told he snores in his sleep. pt states he doesnt have a any energy.     HPI:  James Mcclain is a 72 y.o. male patient , seen here as in a referral  from Dr. Dennard Schaumann for a sleep evaluation,  James Mcclain is a widowed ( wife passed in 2017) African-American, right handed gentleman with a history of snoring. He had a stroke in Summer 2016, was admitted by Dr Leonie Man and followed by Dr Erlinda Hong and our clinical research team for another year. He had no new strokes since. James Mcclain has felt increasingly tired and daytime without feeling sleepy.  He endorsed the Epworth Sleepiness Scale at 2 points, the fatigue severity scale at 49.  He states that he often is exhausted or even avoids certain activities and outings, he used to love golf but he is to fatigued to enjoy it now.  He has been widowed for 2 years and sometimes is not easy to live alone, but he does not feel depressed.  He has no shortness of breath, no trouble catching his breath, no chest pain occasionally he has felt a little lightheaded and this has been attributed to his blood pressure medications.  He has a history of prostate cancer and is status post radiation therapy, also has low testosterone but this cannot be replaced due to his prostate cancer history.   His primary care physician has diagnosed him as chronically fatigued and suspected that this is secondary to the low testosterone levels, he also check vitamin B12 TSH vitamin D.   He discontinued Cardura , Zestoretic two of his 4 antihypertensives.  The patient further has a history of coronary artery disease, a dilated aortic root, history of kidney stones and gout hyperlipidemia and insulin-dependent diabetes, multifocal atrial  tachycardia and paroxysmal atrial fibrillation he also was found to have a patent foramen ovale but worked up for his stroke in 2015.  He has polycythemia prostate cancer with a Gleason score 7 adequate to chronic stroke in August 2015. He has puffy ankles on Norvasc, takes no longer lisinopril and HCTZ, coreg.    Chief complaint according to patient : " I am fatigued" .   Sleep habits are as follows: he lives with a grandson age 23 -  in [place], he watches TV in the evenings- in his bedroom.  His bedtime varies, but is before midnight.  He has isolated nights when he has trouble initiating sleep, on average 4-5 /month, he is watching TV in his bedroom until he gets sleepy, and he estimates an average sleep latency of 30 minutes or so.  He switches the TV off before he goes back to bed.  The bedroom is then quiet, cool and dark.  He sleeps alone, lives alone.  He has 2 bathroom breaks each night, he sleeps " light" , waking up easily, rises at 6 AM. He estimates 6-7 hours of sleep time, but does naps in daytime, 1-2 hours duration. Sometimes this hinders his sleep at night.   Sleep medical history and family sleep history: see above -  2 sisters, having OSA .   Social history: adult children living in [place] , widowed since 2017,  retired Recruitment consultant. Never smoked, quit drinking alcohol 25 years ago, caffeine: 1 coffee a day, 1 soda every day.   Review of Systems: Out of a complete 14 system review, the patient complains of only the following symptoms, and all other reviewed systems are negative. Lonely - dry mouth in AM, no headaches but some lightheadedness resolved with adjustment of  HTN medication   Epworth score 2, Fatigue severity score 49 , depression score 2/ 15   Social History   Socioeconomic History  . Marital status: Single    Spouse name: Not on file  . Number of children: 3  . Years of education: 12th  . Highest education level: Not on file  Occupational History  .  Occupation: retired  Scientific laboratory technician  . Financial resource strain: Not on file  . Food insecurity:    Worry: Not on file    Inability: Not on file  . Transportation needs:    Medical: Not on file    Non-medical: Not on file  Tobacco Use  . Smoking status: Never Smoker  . Smokeless tobacco: Never Used  Substance and Sexual Activity  . Alcohol use: No  . Drug use: No  . Sexual activity: Not Currently  Lifestyle  . Physical activity:    Days per week: Not on file    Minutes per session: Not on file  . Stress: Not on file  Relationships  . Social connections:    Talks on phone: Not on file    Gets together: Not on file    Attends religious service: Not on file    Active member of club or organization: Not on file    Attends meetings of clubs or organizations: Not on file    Relationship status: Not on file  . Intimate partner violence:    Fear of current or ex partner: Not on file    Emotionally abused: Not on file    Physically abused: Not on file    Forced sexual activity: Not on file  Other Topics Concern  . Not on file  Social History Narrative   [name]   Patient is right handed   Patient drinks coffee and sodas    Family History  Problem Relation Age of Onset  . Leukemia Mother   . Cancer Mother   . Heart Problems Father   . Diabetes Sister        ? father  . Heart Problems Brother   . Heart Problems Unknown        family history  . Stroke Unknown   . Stroke Brother   . Hypertension Brother   . Cervical cancer Sister        ????  . Heart attack Neg Hx     Past Medical History:  Diagnosis Date  . Arthritis   . CKD (chronic kidney disease) stage 3, GFR 30-59 ml/min (HCC)   . Coronary artery disease    a. s/p Xience DES to the LAD 07/2012.  . Dilated aortic root ([place])    a. Mildly dilated by echo 09/2013, f/u MRA scheduled for 12/2014.  [name] Elevated PSA   . Essential hypertension   . History of kidney stones   . Hx of gout     . Hyperlipidemia   . Insulin dependent diabetes mellitus ([place])   . 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 On tele 09/2013.  [name] Paroxysmal atrial fibrillation (HCC)   . PFO (patent  foramen ovale)    a. By echo 09/2013.  [name] Polycythemia   . Prostate cancer ([place]) 07/15/12   Gleason 7, external beam radiation Tx, Dr. Pilar Jarvis  . PVC's (premature ventricular contractions)   . Stroke [facility])    a. Cryptogenic, 09/2013, event monitor with NSR. Small PFO noted on echo but neg LE duplex.    Past Surgical History:  Procedure Laterality Date  . COLONOSCOPY WITH PROPOFOL N/A 02/22/2016   Procedure: COLONOSCOPY WITH PROPOFOL;  Surgeon: Carol Ada, MD;  [facility];  Service: Endoscopy;  Laterality: N/A;  . CORONARY ANGIOPLASTY WITH STENT PLACEMENT  08/05/2012   LAD, 1 stent  . EP IMPLANTABLE DEVICE N/A 08/23/2014   Procedure: Loop Recorder Insertion;  Surgeon: Evans Lance, MD;  Location: [place] CV LAB;  Service: Cardiovascular;  Laterality: N/A;  . KIDNEY STONES     2006 - LITHOTRIPSY  . PERCUTANEOUS CORONARY STENT INTERVENTION (PCI-S) N/A 08/05/2012   Procedure: PERCUTANEOUS CORONARY STENT INTERVENTION (PCI-S);  Surgeon: Sherren Mocha, MD;  Location: [facility] CATH LAB;  Service: Cardiovascular;  Laterality: N/A;  . PROSTATE BIOPSY  07/15/2012  . PROSTATE BIOPSY  09/23/2013  . TEE WITHOUT CARDIOVERSION N/A 09/27/2013   Procedure: TRANSESOPHAGEAL ECHOCARDIOGRAM (TEE);  Surgeon: Larey Dresser, MD;  Location: [place];  Service: Cardiovascular;  Laterality: N/A;  . TONSILLECTOMY     as adult  . TOTAL KNEE ARTHROPLASTY  01/24/2011   Procedure: TOTAL KNEE ARTHROPLASTY;  Surgeon: Alta Corning;  Location: [place];  Service: Orthopedics;  Laterality: Left;  COMPUTER ASSISTED LEFT  TOTAL KNEE REPLACEMENT. Anesthesia a combination of regional and general.    Current Outpatient Medications  Medication Sig Dispense Refill  . allopurinol (ZYLOPRIM) 100 MG tablet TAKE TWO TABLETS BY MOUTH ONCE  DAILY 60 tablet 5  . amLODipine (NORVASC) 10 MG tablet Take 1 tablet (10 mg total) by mouth daily. 90 tablet 3  . apixaban (ELIQUIS) 5 MG TABS tablet Take 1 tablet (5 mg total) by mouth 2 (two) times daily. 180 tablet 3  . aspirin 81 MG tablet Take 81 mg by mouth daily. Reported on 04/05/2015    . atorvastatin (LIPITOR) 40 MG tablet Take 1 tablet (40 mg total) by mouth daily. 90 tablet 1  . Blood Glucose Monitoring Suppl (ONETOUCH VERIO) w/Device KIT 1 Device by Does not apply route 2 (two) times daily. E11.9 1 kit 2  . carvedilol (COREG) 12.5 MG tablet Take 1 tablet (12.5 mg total) by mouth 2 (two) times daily. 180 tablet 3  . glucose blood test strip Check BS bid DX: E11.9 100 each 12  . Insulin Glargine (LANTUS SOLOSTAR) 100 UNIT/ML Solostar Pen Inject 45 Units into the skin daily. 15 mL 3  . Insulin Syringe-Needle U-100 (SAFETY INSULIN SYRINGES) 30G X 5/16" 0.5 ML MISC As needed for insulin 100 each 11  . lisinopril (PRINIVIL,ZESTRIL) 40 MG tablet Take 1 tablet (40 mg total) by mouth daily. 90 tablet 3  . trimethoprim-polymyxin b (POLYTRIM) ophthalmic solution Place 2 drops into both eyes every 4 (four) hours. 10 mL 0   No current facility-administered medications for this visit.     Allergies as of 08/05/2017 - Review Complete 08/05/2017  Allergen Reaction Noted  . Betadine [povidone iodine] Other (See Comments) 12/04/2014  . Contrast media [iodinated diagnostic agents] Other (See Comments) 05/09/2012  . Fish allergy Swelling 01/20/2011    Vitals: BP (!) 164/95   Pulse 88   Ht '5\' 10"'$  (1.778 m)   Wt 245  lb (111.1 kg)   BMI 35.15 kg/m  Last Weight:  Wt Readings from Last 1 Encounters:  08/05/17 245 lb (111.1 kg)   TKP:TWSF mass index is 35.15 kg/m.     Last Height:   Ht Readings from Last 1 Encounters:  08/05/17 '5\' 10"'$  (1.778 m)    Physical exam:  General: The patient is awake, alert and appears not in acute distress. The patient is groomed. Head: Normocephalic, atraumatic.  Neck is supple. Mallampati 3 ,  neck circumference:16.5 " . Nasal airflow patent, retrognathia is seen. Poor dentition.  Cardiovascular:  Regular rate and rhythm , without  murmurs or carotid bruit, and without distended neck veins. Respiratory: Lungs are clear to auscultation. Skin:  Without evidence of edema, or rash Trunk: BMI is 35. 5 . The patient's posture is erect  Neurologic exam : The patient is awake and alert, oriented to place and time.   Attention span & concentration ability appears normal.  Speech is fluent,  without dysarthria, with mild  dysphonia and mild  aphasia.  Mood and affect are appropriate.  Cranial nerves: Pupils are equal and briskly reactive to light. Extraocular movements  in vertical and horizontal planes intact and without nystagmus. Visual fields by finger perimetry are intact.Hearing to finger rub impaired, he has hearing aids. Facial sensation intact to fine touch. Facial motor strength is symmetric and tongue and uvula move midline. Shoulder shrug was symmetrical.   Motor exam: Normal tone, muscle bulk were symmetric  in all extremities. Grip is weaker in his right, dominant side, left grip is very strong.  Sensory:  Fine touch, pinprick and vibration were tested in all extremities. Proprioception tested in the upper extremities was normal. Coordination: Rapid alternating movements in the fingers/hands was normal. Finger-to-nose maneuver  normal without evidence of ataxia, dysmetria or tremor. Gait and station: Patient walks without assistive device , able  to climb up to the exam table. Strength within normal limits.  Stance is stable and normal.  Tandem gait deferred Turns with 4 Steps.  Deep tendon reflexes: in the upper and lower extremities are symmetric and intact. Babinski maneuver response is downgoing.  I reviewed the labs from 07-07-2017- and the original notes form the stroke service. He has an elevated creatinine level, GFR is 54 instead of 60,  CKD stage 1-2. High potassium, high blood glucose. Low Co2.    Assessment:  After physical and neurologic examination, review of laboratory studies,  Personal review of imaging studies, reports of other /same  Imaging studies, results of polysomnography and / or neurophysiology testing and pre-existing records as far as provided in visit., my assessment is   1)  Patient with much more prominent fatigue than sleepiness, apnea is is not main cause of fatigue, more likely testosterone deficiency and diabetes.  May be still a bit of grieving/ depression. Because his grandson has not stated that he still snores, will do a Sleep test. He has lost weight since his wife died.     The patient was advised of the nature of the diagnosed disorder , the treatment options and the  risks for general health and wellness arising from not treating the condition.   I spent more than 35 minutes of face to face time with the patient.  Greater than 50% of time was spent in counseling and coordination of care. We have discussed the diagnosis and differential and I answered the patient's questions.    Plan:  Treatment plan and additional workup :  I will order an attended sleep study for patient with history of snoring , chronic fatigue, CAD, DM and HTN -  [employer]. SPLIT at AHI 20, 4 %     Larey Seat, MD 07/04/2583, 2:77 AM  Certified in Neurology by ABPN Certified in [place] by [facility] [address] [place], [place] [address]

## 2017-08-10 ENCOUNTER — Ambulatory Visit (INDEPENDENT_AMBULATORY_CARE_PROVIDER_SITE_OTHER): Payer: Medicare HMO | Admitting: Family Medicine

## 2017-08-10 ENCOUNTER — Encounter: Payer: Self-pay | Admitting: Family Medicine

## 2017-08-10 VITALS — BP 150/84 | HR 62 | Temp 98.3°F | Resp 16 | Ht 70.0 in | Wt 250.0 lb

## 2017-08-10 DIAGNOSIS — R0600 Dyspnea, unspecified: Secondary | ICD-10-CM

## 2017-08-10 DIAGNOSIS — R9431 Abnormal electrocardiogram [ECG] [EKG]: Secondary | ICD-10-CM

## 2017-08-10 DIAGNOSIS — Z794 Long term (current) use of insulin: Secondary | ICD-10-CM | POA: Diagnosis not present

## 2017-08-10 DIAGNOSIS — R55 Syncope and collapse: Secondary | ICD-10-CM

## 2017-08-10 DIAGNOSIS — R0609 Other forms of dyspnea: Secondary | ICD-10-CM | POA: Diagnosis not present

## 2017-08-10 DIAGNOSIS — N183 Chronic kidney disease, stage 3 unspecified: Secondary | ICD-10-CM

## 2017-08-10 DIAGNOSIS — I480 Paroxysmal atrial fibrillation: Secondary | ICD-10-CM | POA: Diagnosis not present

## 2017-08-10 DIAGNOSIS — E119 Type 2 diabetes mellitus without complications: Secondary | ICD-10-CM | POA: Diagnosis not present

## 2017-08-10 DIAGNOSIS — IMO0001 Reserved for inherently not codable concepts without codable children: Secondary | ICD-10-CM

## 2017-08-10 MED ORDER — LISINOPRIL 40 MG PO TABS: 40.0000 mg | ORAL_TABLET | Freq: Every day | ORAL | 3 refills | Status: DC

## 2017-08-10 MED ORDER — METFORMIN HCL 1000 MG PO TABS: 1000.0000 mg | ORAL_TABLET | Freq: Two times a day (BID) | ORAL | 3 refills | Status: DC

## 2017-08-10 NOTE — Progress Notes (Signed)
Subjective:    Patient ID: James Mcclain, male    DOB: [birth year], 72 y.o.   MRN: [id number]  HPI  08/2016 Patient reports severe fatigue. He states that he just doesn't have any energy. Whenever he goes to try to exercise, he feels extremely weak and tired. He denies any chest pain. He denies any shortness of breath. He denies any dyspnea on exertion. He denies any orthopnea. He does occasionally have lightheadedness. He denies any weight loss. He is 248 pounds today which is the same as his weight in April. He denies any melena or hematochezia. He denies any dysuria or hematuria. He denies any rash or abdominal pain. He does have a history of prostate cancer status post prostate radiation therapy. He has a history of low testosterone but he is unable to take testosterone replacement due to his history of prostate cancer. This could be contributing to his fatigue. He is also on numerous antihypertensive medications at time his blood pressure is low at home and he is having some lightheadedness. For instance he is taking Cardura which could be contributing to some of the symptoms. He is due for lab work.  At that time, my plan was: I suspect that his chronic fatigue is secondary to hypogonadism. However we discussed that he cannot take testosterone replacement. I will check a testosterone level to confirm a suspicion. I'll also check a vitamin B12 level and as well as a TSH. Also check a CBC, CMP, hemoglobin A1c, and a sedimentation rate. Discontinue Cardura. Try over-the-counter vitamin B12 supplement 1000 g by mouth daily.  06/15/17 Since the patient's visit in July of last year, he has lost 2 pounds.  He continues to complain of chronic fatigue.  He denies any chest pain.  He denies any shortness of breath.  He denies any hemoptysis.  He denies any night sweats or fevers.  He denies any abdominal pain.  He denies any melena or hematochezia.  He denies any rashes.  He denies any bruising.  He denies any  fevers or chills.  He denies any dysuria.  Lab work in July of last year was significant only for a mildly suppressed testosterone level of 207.  However the patient cannot have treatment for this due to his history of prostate cancer.  He denies any hypersomnolence.  He denies any apnea episodes at night that wake him up.  However he does report feeling sleepy during the day.  He can fall asleep easily as a passenger in a car.  He states that he can fall asleep after eating meals.  He reads seldom but he could fall asleep with reading or watching TV.  At that time, my plan was: Patient's initial lab work was reassuring.  He has had the symptoms now for 9 months with no significant change in weight or physical exam findings.  This leads me to believe that there is not likely a serious underlying malignancy or infection causing his symptoms.  I will check a cortisol level to evaluate for adrenal insufficiency.  I will repeat his lab work including a CBC, CMP.  I will also obtain a baseline chest x-ray to look for lymphadenopathy in the chest or masses in the lungs.  If lab work above is normal, I would recommend a sleep study to evaluate for obstructive sleep apnea as a potential cause of his fatigue.  I explained to the patient that I cannot treat his testosterone deficiency due to his history of prostate  cancer.  Patient has not had a PSA checked in more than a year and therefore I will obtain that today while checking lab work  08/10/17 Patient has a history of diabetes mellitus type 2.  He is overdue to check a hemoglobin A1c.  His last hemoglobin A1c was checked about 10 months ago and was 6.1.  We stopped Lisinopril HCT and metformin due to recent elevations in his Cr.  His last BMP was in the end of April and was significant for a GFR of 49 mL of blood per minute.  However, patient never started plain lisinopril for renal protection as planned.  As result, his blood pressure is elevated.  However was more  concerning for me is that the patient continues to endorse severe fatigue.  However now he states that he is having dyspnea on exertion.  He states that whenever he does any strenuous activity he becomes extremely winded very fast.  He denies any angina.  He denies any chest pain.  However he states that yesterday he was walking up a hill to go into his church, and he almost passed out due to the strain of the activity.  He felt extremely lightheaded like he was going to pass out.  He also states that he is becoming progressively more winded with less activity.  On examination today, his heart rate is irregularly irregular.  However there is no JVD.  There is no evidence of fluid overload or pulmonary edema on his exam.  He also endorses occasional word finding difficulties.  Even today, he seems to be struggling to come up with the names of medications and certain common objects.  He states that this is been going on for quite a while.  Cranial nerves II through XII are grossly intact muscle strength is 5/5 equal and symmetric in the upper and lower extremities.  There is no pronator drift.  There is no facial asymmetry. Past Medical History:  Diagnosis Date  . Arthritis   . CKD (chronic kidney disease) stage 3, GFR 30-59 ml/min (HCC)   . Coronary artery disease    a. s/p Xience DES to the LAD 07/2012.  . Dilated aortic root ([place])    a. Mildly dilated by echo 09/2013, f/u MRA scheduled for 12/2014.  [name] Elevated PSA   . Essential hypertension   . History of kidney stones   . Hx of gout   . Hyperlipidemia   . Insulin dependent diabetes mellitus ([place])   . 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 On tele 09/2013.  [name] Paroxysmal atrial fibrillation (HCC)   . PFO (patent foramen ovale)    a. By echo 09/2013.  [name] Polycythemia   . Prostate cancer ([place]) 07/15/12   Gleason 7, external beam radiation Tx, Dr. Pilar Jarvis  . PVC's (premature ventricular contractions)   . Stroke [facility])    a. Cryptogenic, 09/2013,  event monitor with NSR. Small PFO noted on echo but neg LE duplex.   Past Surgical History:  Procedure Laterality Date  . COLONOSCOPY WITH PROPOFOL N/A 02/22/2016   Procedure: COLONOSCOPY WITH PROPOFOL;  Surgeon: Carol Ada, MD;  [facility];  Service: Endoscopy;  Laterality: N/A;  . CORONARY ANGIOPLASTY WITH STENT PLACEMENT  08/05/2012   LAD, 1 stent  . EP IMPLANTABLE DEVICE N/A 08/23/2014   Procedure: Loop Recorder Insertion;  Surgeon: Evans Lance, MD;  Location: [facility] CV [facility];  Service: Cardiovascular;  Laterality: N/A;  . KIDNEY STONES  2006 - LITHOTRIPSY  . PERCUTANEOUS CORONARY STENT INTERVENTION (PCI-S) N/A 08/05/2012   Procedure: PERCUTANEOUS CORONARY STENT INTERVENTION (PCI-S);  Surgeon: Sherren Mocha, MD;  Location: [facility] CATH LAB;  Service: Cardiovascular;  Laterality: N/A;  . PROSTATE BIOPSY  07/15/2012  . PROSTATE BIOPSY  09/23/2013  . TEE WITHOUT CARDIOVERSION N/A 09/27/2013   Procedure: TRANSESOPHAGEAL ECHOCARDIOGRAM (TEE);  Surgeon: Larey Dresser, MD;  Location: [facility];  Service: Cardiovascular;  Laterality: N/A;  . TONSILLECTOMY     as adult  . TOTAL KNEE ARTHROPLASTY  01/24/2011   Procedure: TOTAL KNEE ARTHROPLASTY;  Surgeon: Alta Corning;  Location: [facility];  Service: Orthopedics;  Laterality: Left;  COMPUTER ASSISTED LEFT  TOTAL KNEE REPLACEMENT. Anesthesia a combination of regional and general.   Current Outpatient Medications on File Prior to Visit  Medication Sig Dispense Refill  . allopurinol (ZYLOPRIM) 100 MG tablet TAKE TWO TABLETS BY MOUTH ONCE DAILY 60 tablet 5  . amLODipine (NORVASC) 10 MG tablet Take 1 tablet (10 mg total) by mouth daily. 90 tablet 3  . apixaban (ELIQUIS) 5 MG TABS tablet Take 1 tablet (5 mg total) by mouth 2 (two) times daily. 180 tablet 3  . aspirin 81 MG tablet Take 81 mg by mouth daily. Reported on 04/05/2015    . atorvastatin (LIPITOR) 40 MG tablet Take 1 tablet (40 mg total) by mouth daily. 90 tablet 1  . Blood  Glucose Monitoring Suppl (ONETOUCH VERIO) w/Device KIT 1 Device by Does not apply route 2 (two) times daily. E11.9 1 kit 2  . carvedilol (COREG) 12.5 MG tablet Take 1 tablet (12.5 mg total) by mouth 2 (two) times daily. 180 tablet 3  . glucose blood test strip Check BS bid DX: E11.9 100 each 12  . Insulin Glargine (LANTUS SOLOSTAR) 100 UNIT/ML Solostar Pen Inject 45 Units into the skin daily. 15 mL 3  . Insulin Syringe-Needle U-100 (SAFETY INSULIN SYRINGES) 30G X 5/16" 0.5 ML MISC As needed for insulin 100 each 11  . trimethoprim-polymyxin b (POLYTRIM) ophthalmic solution Place 2 drops into both eyes every 4 (four) hours. 10 mL 0  . lisinopril (PRINIVIL,ZESTRIL) 40 MG tablet Take 1 tablet (40 mg total) by mouth daily. (Patient not taking: Reported on 08/10/2017) 90 tablet 3   No current facility-administered medications on file prior to visit.    Allergies  Allergen Reactions  . Betadine [Povidone Iodine] Other (See Comments)    Patient doesn't remember  . Contrast Media [Iodinated Diagnostic Agents] Other (See Comments)    Patient doesn't remember  . Fish Allergy Swelling   Social History   Socioeconomic History  . Marital status: Single    Spouse name: Not on file  . Number of children: 3  . Years of education: 12th  . Highest education level: Not on file  Occupational History  . Occupation: retired  Scientific laboratory technician  . Financial resource strain: Not on file  . Food insecurity:    Worry: Not on file    Inability: Not on file  . Transportation needs:    Medical: Not on file    Non-medical: Not on file  Tobacco Use  . Smoking status: Never Smoker  . Smokeless tobacco: Never Used  Substance and Sexual Activity  . Alcohol use: No  . Drug use: No  . Sexual activity: Not Currently  Lifestyle  . Physical activity:    Days per week: Not on file    Minutes per session: Not on file  . Stress: Not on  file  Relationships  . Social connections:    Talks on phone: Not on file     Gets together: Not on file    Attends religious service: Not on file    Active member of club or organization: Not on file    Attends meetings of clubs or organizations: Not on file    Relationship status: Not on file  . Intimate partner violence:    Fear of current or ex partner: Not on file    Emotionally abused: Not on file    Physically abused: Not on file    Forced sexual activity: Not on file  Other Topics Concern  . Not on file  Social History Narrative   [name]   Patient is right handed   Patient drinks coffee and sodas      Review of Systems  All other systems reviewed and are negative.      Objective:   Physical Exam  Constitutional: He appears well-nourished. No distress.  HENT:  Mouth/Throat: No oropharyngeal exudate.  Neck: Neck supple. No JVD present. No thyromegaly present.  Cardiovascular: Normal rate and normal heart sounds. An irregularly irregular rhythm present. Exam reveals no gallop and no friction rub.  No murmur heard. Pulmonary/Chest: Effort normal and breath sounds normal. No respiratory distress. He has no wheezes. He has no rales.  Abdominal: Soft. Bowel sounds are normal. He exhibits no distension. There is no tenderness. There is no rebound and no guarding.  Musculoskeletal: He exhibits no edema.  Lymphadenopathy:    He has no cervical adenopathy.  Skin: No rash noted. He is not diaphoretic.  Vitals reviewed.         Assessment & Plan:  Insulin dependent diabetes mellitus (HCC) - Plan: Microalbumin, urine, Hemoglobin A1c  Dyspnea on exertion - Plan: EKG 12-Lead  Paroxysmal atrial fibrillation (HCC)  CKD (chronic kidney disease) stage 3, GFR 30-59 ml/min (HCC)  First, regarding his diabetes, his creatinine is borderline to except metformin however when he was on the metformin his sugars are well controlled.  Given the issues of cost with name brand medication and his inability to afford those calls, I  believe the patient should resume his metformin 1000 mg p.o. twice daily.  I will also check a hemoglobin A1c today and then likely recheck it in 3 months on the metformin.  If sugars remain elevated, consider Victoza next.  Regarding his fatigue, he is already seeing the neurologist and they are planning a sleep study.  However he is now endorsing significant dyspnea on exertion and near syncope on exertion.  Therefore I believe we need to get a cardiology consultation to evaluate for possible cardiac causes of fatigue, dyspnea on exertion, and near syncope.  I would like the patient to resume lisinopril 40 mg a day for hypertension and renal protection from the diabetes.  EKG was obtained today in the office which reveals right bundle branch block but new left axis bifascicular block with downsloping ST depression in leads V2, V3, and V4.  This is insignificant change from his previous EKG in 2017.  He is also in trigeminy.  Therefore I will expedite cardiology consultation

## 2017-08-11 ENCOUNTER — Other Ambulatory Visit: Payer: Self-pay | Admitting: Family Medicine

## 2017-08-11 LAB — CUP PACEART REMOTE DEVICE CHECK
Date Time Interrogation Session: 20190525203909
Implantable Pulse Generator Implant Date: 20160629

## 2017-08-11 LAB — HEMOGLOBIN A1C
Hgb A1c MFr Bld: 8.3 % of total Hgb — ABNORMAL HIGH (ref <5.7)
Mean Plasma Glucose: 192 (calc)
eAG (mmol/L): 10.6 (calc)

## 2017-08-11 LAB — MICROALBUMIN, URINE: Microalb, Ur: 265.6 mg/dL

## 2017-08-11 MED ORDER — SITAGLIPTIN PHOSPHATE 100 MG PO TABS: 100.0000 mg | ORAL_TABLET | Freq: Every day | ORAL | 3 refills | Status: DC

## 2017-08-11 NOTE — Progress Notes (Signed)
Referring-Warren Dennard Schaumann, MD Reason for referral-dyspnea  HPI: 72 year old male for evaluation of dyspnea at request of Jenna Luo, MD.  Patient previously followed by Dr. Aundra Dubin.  He had a cryptogenic stroke in 2015 treated with tPA and a fu loop monitor showed paroxysmal atrial fibrillation.  He has been anticoagulated for that.  Patient had a cardiac catheterization in June 2014 which showed a 90% proximal LAD and he had PCI at that time.  Echocardiogram March 2016 showed normal LV systolic function and grade 1 diastolic dysfunction.  Patient also has a history of thoracic aortic aneurysm.  MRA August 2017 showed ascending aneurysm at 4.4 cm and stable sinus of Valsalva aneurysm at 4.5 cm. Also with PFO documented by prior TEE. Since last seen, patient notes dyspnea on exertion for approximately 1 year.  He also has fatigue with activities.  There is no orthopnea or PND.  Chronic mild edema right lower extremity.  No chest pain.  Current Outpatient Medications  Medication Sig Dispense Refill  . allopurinol (ZYLOPRIM) 100 MG tablet TAKE TWO TABLETS BY MOUTH ONCE DAILY 60 tablet 5  . amLODipine (NORVASC) 10 MG tablet Take 1 tablet (10 mg total) by mouth daily. 90 tablet 3  . apixaban (ELIQUIS) 5 MG TABS tablet Take 1 tablet (5 mg total) by mouth 2 (two) times daily. 180 tablet 3  . aspirin 81 MG tablet Take 81 mg by mouth daily. Reported on 04/05/2015    . atorvastatin (LIPITOR) 40 MG tablet Take 1 tablet (40 mg total) by mouth daily. 90 tablet 1  . Blood Glucose Monitoring Suppl (ONETOUCH VERIO) w/Device KIT 1 Device by Does not apply route 2 (two) times daily. E11.9 1 kit 2  . carvedilol (COREG) 12.5 MG tablet Take 1 tablet (12.5 mg total) by mouth 2 (two) times daily. 180 tablet 3  . glucose blood test strip Check BS bid DX: E11.9 100 each 12  . Insulin Glargine (LANTUS SOLOSTAR) 100 UNIT/ML Solostar Pen Inject 45 Units into the skin daily. 15 mL 3  . Insulin Syringe-Needle U-100 (SAFETY  INSULIN SYRINGES) 30G X 5/16" 0.5 ML MISC As needed for insulin 100 each 11  . lisinopril (PRINIVIL,ZESTRIL) 40 MG tablet Take 1 tablet (40 mg total) by mouth daily. (Patient not taking: Reported on 08/10/2017) 90 tablet 3  . lisinopril (PRINIVIL,ZESTRIL) 40 MG tablet Take 1 tablet (40 mg total) by mouth daily. 90 tablet 3  . metFORMIN (GLUCOPHAGE) 1000 MG tablet Take 1 tablet (1,000 mg total) by mouth 2 (two) times daily with a meal. 180 tablet 3  . sitaGLIPtin (JANUVIA) 100 MG tablet Take 1 tablet (100 mg total) by mouth daily. 30 tablet 3  . trimethoprim-polymyxin b (POLYTRIM) ophthalmic solution Place 2 drops into both eyes every 4 (four) hours. 10 mL 0   No current facility-administered medications for this visit.     Allergies  Allergen Reactions  . Betadine [Povidone Iodine] Other (See Comments)    Patient doesn't remember  . Contrast Media [Iodinated Diagnostic Agents] Other (See Comments)    Patient doesn't remember  . Fish Allergy Swelling    Past Medical History:  Diagnosis Date  . Arthritis   . CKD (chronic kidney disease) stage 3, GFR 30-59 ml/min (HCC)   . Coronary artery disease    a. s/p Xience DES to the LAD 07/2012.  . Dilated aortic root ([place])    a. Mildly dilated by echo 09/2013, f/u MRA scheduled for 12/2014.  [name] Elevated PSA   .  Essential hypertension   . History of kidney stones   . Hx of gout   . Hyperlipidemia   . Insulin dependent diabetes mellitus ([place])   . 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 On tele 09/2013.  [name] Paroxysmal atrial fibrillation (HCC)   . PFO (patent foramen ovale)    a. By echo 09/2013.  [name] Polycythemia   . Prostate cancer ([place]) 07/15/12   Gleason 7, external beam radiation Tx, Dr. Pilar Jarvis  . PVC's (premature ventricular contractions)   . Stroke [facility])    a. Cryptogenic, 09/2013, event monitor with NSR. Small PFO noted on echo but neg LE duplex.    Past Surgical History:  Procedure Laterality Date  . COLONOSCOPY WITH PROPOFOL N/A  02/22/2016   Procedure: COLONOSCOPY WITH PROPOFOL;  Surgeon: Carol Ada, MD;  [facility];  Service: Endoscopy;  Laterality: N/A;  . CORONARY ANGIOPLASTY WITH STENT PLACEMENT  08/05/2012   LAD, 1 stent  . EP IMPLANTABLE DEVICE N/A 08/23/2014   Procedure: Loop Recorder Insertion;  Surgeon: Evans Lance, MD;  Location: [facility] CV LAB;  Service: Cardiovascular;  Laterality: N/A;  . KIDNEY STONES     2006 - LITHOTRIPSY  . PERCUTANEOUS CORONARY STENT INTERVENTION (PCI-S) N/A 08/05/2012   Procedure: PERCUTANEOUS CORONARY STENT INTERVENTION (PCI-S);  Surgeon: Sherren Mocha, MD;  [facility] C[facility] LAB;  Service: Cardiovascular;  Laterality: N/A;  . PROSTATE BIOPSY  07/15/2012  . PROSTATE BIOPSY  09/23/2013  . TEE WITHOUT CARDIOVERSION N/A 09/27/2013   Procedure: TRANSESOPHAGEAL ECHOCARDIOGRAM (TEE);  Surgeon: Larey Dresser, MD;  Location: [facility];  Service: Cardiovascular;  Laterality: N/A;  . TONSILLECTOMY     as adult  . TOTAL KNEE ARTHROPLASTY  01/24/2011   Procedure: TOTAL KNEE ARTHROPLASTY;  Surgeon: Alta Corning;  Location: [facility];  Service: Orthopedics;  Laterality: Left;  COMPUTER ASSISTED LEFT  TOTAL KNEE REPLACEMENT. Anesthesia a combination of regional and general.    Social History   Socioeconomic History  . Marital status: Single    Spouse name: Not on file  . Number of children: 3  . Years of education: 12th  . Highest education level: Not on file  Occupational History  . Occupation: retired  Scientific laboratory technician  . Financial resource strain: Not on file  . Food insecurity:    Worry: Not on file    Inability: Not on file  . Transportation needs:    Medical: Not on file    Non-medical: Not on file  Tobacco Use  . Smoking status: Never Smoker  . Smokeless tobacco: Never Used  Substance and Sexual Activity  . Alcohol use: No  . Drug use: No  . Sexual activity: Not Currently  Lifestyle  . Physical activity:    Days per week: Not on file    Minutes per  session: Not on file  . Stress: Not on file  Relationships  . Social connections:    Talks on phone: Not on file    Gets together: Not on file    Attends religious service: Not on file    Active member of club or organization: Not on file    Attends meetings of clubs or organizations: Not on file    Relationship status: Not on file  . Intimate partner violence:    Fear of current or ex partner: Not on file    Emotionally abused: Not on file    Physically abused: Not on file    Forced sexual activity: Not on  file  Other Topics Concern  . Not on file  Social History Narrative   [name]   Patient is right handed   Patient drinks coffee and sodas    Family History  Problem Relation Age of Onset  . Leukemia Mother   . Cancer Mother   . Heart Problems Father   . Diabetes Sister        ? father  . Heart Problems Brother   . Heart Problems Unknown        family history  . Stroke Unknown   . Stroke Brother   . Hypertension Brother   . Cervical cancer Sister        ????  . Heart attack Neg Hx     ROS: Dyspnea on exertion and fatigue but no fevers or chills, productive cough, hemoptysis, dysphasia, odynophagia, melena, hematochezia, dysuria, hematuria, rash, seizure activity, orthopnea, PND, pedal edema, claudication. Remaining systems are negative.  Physical Exam:   There were no vitals taken for this visit.  General:  Well developed/well nourished in NAD Skin warm/dry Patient not depressed No peripheral clubbing Back-normal HEENT-normal/normal eyelids Neck supple/normal carotid upstroke bilaterally; no bruits; no JVD; no thyromegaly chest - CTA/ normal expansion CV - RRR/normal S1 and S2; no murmurs, rubs or gallops;  PMI nondisplaced Abdomen -NT/ND, no HSM, no mass, + bowel sounds, no bruit 2+ femoral pulses, no bruits Ext-no edema, chords, 2+ DP Neuro-grossly nonfocal  ECG -sinus rhythm at a rate of 74.  Occasional PVC.  Right bundle  branch block.  Cannot rule out prior inferior infarct.  Personally reviewed  A/P  1 dyspnea-etiology unclear.  Given risk factors and history of coronary disease I will arrange a stress nuclear study for risk stratification.  Repeat echocardiogram to assess LV function.  2 paroxysmal atrial fibrillation-patient remains in sinus rhythm today.  Continue carvedilol for rate control if atrial fibrillation recurs.  Continue apixaban.  3 ascending thoracic aortic aneurysm-we will arrange follow-up CTA.  Note he has an allergy to dye on his list and we will premedicate prior to procedure.  He does not recall dye allergy.  4 hypertension-blood pressure is controlled.  Continue present medications.  5 hyperlipidemia-continue statin.  6 prior cryptogenic CVA-in setting of documented atrial fibrillation he will require lifelong anticoagulation.  7 chronic stage III kidney disease-we will repeat potassium and renal function 1 week following CTA.  Kirk Ruths, MD

## 2017-08-12 ENCOUNTER — Ambulatory Visit (INDEPENDENT_AMBULATORY_CARE_PROVIDER_SITE_OTHER): Payer: PPO | Admitting: Cardiology

## 2017-08-12 ENCOUNTER — Encounter: Payer: Self-pay | Admitting: *Deleted

## 2017-08-12 ENCOUNTER — Encounter: Payer: Self-pay | Admitting: Cardiology

## 2017-08-12 VITALS — BP 138/78 | HR 74 | Ht 70.0 in | Wt 274.4 lb

## 2017-08-12 DIAGNOSIS — E7800 Pure hypercholesterolemia, unspecified: Secondary | ICD-10-CM | POA: Diagnosis not present

## 2017-08-12 DIAGNOSIS — R0602 Shortness of breath: Secondary | ICD-10-CM | POA: Diagnosis not present

## 2017-08-12 DIAGNOSIS — I10 Essential (primary) hypertension: Secondary | ICD-10-CM

## 2017-08-12 DIAGNOSIS — I712 Thoracic aortic aneurysm, without rupture, unspecified: Secondary | ICD-10-CM

## 2017-08-12 DIAGNOSIS — I2510 Atherosclerotic heart disease of native coronary artery without angina pectoris: Secondary | ICD-10-CM

## 2017-08-12 DIAGNOSIS — I480 Paroxysmal atrial fibrillation: Secondary | ICD-10-CM

## 2017-08-12 MED ORDER — PREDNISONE 20 MG PO TABS: ORAL_TABLET | ORAL | 0 refills | Status: DC

## 2017-08-12 NOTE — Patient Instructions (Signed)
Medication Instructions:   NO CHANGE  Testing/Procedures:  Your physician has requested that you have en exercise stress myoview. For further information please visit [email or web address]. Please follow instruction sheet, as given.   Your physician has requested that you have an echocardiogram. Echocardiography is a painless test that uses sound waves to create images of your heart. It provides your doctor with information about the size and shape of your heart and how well your heart's chambers and valves are working. This procedure takes approximately one hour. There are no restrictions for this procedure.   Non-Cardiac CT Angiography (CTA), is a special type of CT scan that uses a computer to produce multi-dimensional views of major blood vessels throughout the body. In CT angiography, a contrast material is injected through an IV to help visualize the blood vessels   Follow-Up:  Your physician recommends that you schedule a follow-up appointment in: [place]

## 2017-08-12 NOTE — Addendum Note (Signed)
Addended by: Cristopher Estimable on: 08/12/2017 09:25 AM   Modules accepted: Orders

## 2017-08-12 NOTE — Addendum Note (Signed)
Addended by: Cristopher Estimable on: 08/12/2017 09:29 AM   Modules accepted: Orders

## 2017-08-12 NOTE — Addendum Note (Signed)
Addended by: Cristopher Estimable on: 08/12/2017 09:20 AM   Modules accepted: Orders

## 2017-08-13 LAB — BASIC METABOLIC PANEL
BUN/Creatinine Ratio: 12 (ref 10–24)
BUN: 20 mg/dL (ref 8–27)
CO2: 24 mmol/L (ref 20–29)
Calcium: 9.3 mg/dL (ref 8.6–10.2)
Chloride: 102 mmol/L (ref 96–106)
Creatinine, Ser: 1.62 mg/dL — ABNORMAL HIGH (ref 0.76–1.27)
GFR calc Af Amer: 49 mL/min/{1.73_m2} — ABNORMAL LOW (ref >59)
GFR calc non Af Amer: 42 mL/min/{1.73_m2} — ABNORMAL LOW (ref >59)
Glucose: 88 mg/dL (ref 65–99)
Potassium: 4.8 mmol/L (ref 3.5–5.2)
Sodium: 142 mmol/L (ref 134–144)

## 2017-08-18 ENCOUNTER — Telehealth (HOSPITAL_COMMUNITY): Payer: Self-pay | Admitting: *Deleted

## 2017-08-18 NOTE — Telephone Encounter (Signed)
Left message on voicemail in reference to upcoming appointment scheduled for 08/24/17. Phone number given for a call back so details instructions can be given. James Mcclain, [name]

## 2017-08-20 ENCOUNTER — Ambulatory Visit (INDEPENDENT_AMBULATORY_CARE_PROVIDER_SITE_OTHER): Payer: Medicare HMO | Admitting: *Deleted

## 2017-08-20 DIAGNOSIS — I6389 Other cerebral infarction: Secondary | ICD-10-CM

## 2017-08-21 ENCOUNTER — Telehealth (HOSPITAL_COMMUNITY): Payer: Self-pay | Admitting: *Deleted

## 2017-08-21 NOTE — Telephone Encounter (Signed)
Left message on voicemail in reference to upcoming appointment scheduled for 08/24/17. Phone number given for a call back so details instructions can be given.  Kirstie Peri

## 2017-08-21 NOTE — Progress Notes (Signed)
Carelink Summary Report / Loop Recorder 

## 2017-08-24 ENCOUNTER — Ambulatory Visit (HOSPITAL_BASED_OUTPATIENT_CLINIC_OR_DEPARTMENT_OTHER): Payer: Medicare HMO

## 2017-08-24 ENCOUNTER — Other Ambulatory Visit: Payer: Self-pay

## 2017-08-24 ENCOUNTER — Ambulatory Visit (HOSPITAL_COMMUNITY)
Admission: RE | Admit: 2017-08-24 | Discharge: 2017-08-24 | Disposition: A | Discharge status: Home / Self Care | Payer: Medicare HMO | Source: Ambulatory Visit | Attending: Cardiology | Admitting: Cardiology

## 2017-08-24 DIAGNOSIS — I712 Thoracic aortic aneurysm, without rupture, unspecified: Secondary | ICD-10-CM

## 2017-08-24 DIAGNOSIS — R0602 Shortness of breath: Secondary | ICD-10-CM | POA: Insufficient documentation

## 2017-08-24 DIAGNOSIS — I071 Rheumatic tricuspid insufficiency: Secondary | ICD-10-CM | POA: Insufficient documentation

## 2017-08-24 DIAGNOSIS — Z794 Long term (current) use of insulin: Secondary | ICD-10-CM | POA: Diagnosis not present

## 2017-08-24 DIAGNOSIS — N189 Chronic kidney disease, unspecified: Secondary | ICD-10-CM | POA: Insufficient documentation

## 2017-08-24 DIAGNOSIS — E119 Type 2 diabetes mellitus without complications: Secondary | ICD-10-CM | POA: Insufficient documentation

## 2017-08-24 DIAGNOSIS — I129 Hypertensive chronic kidney disease with stage 1 through stage 4 chronic kidney disease, or unspecified chronic kidney disease: Secondary | ICD-10-CM | POA: Insufficient documentation

## 2017-08-24 DIAGNOSIS — I25119 Atherosclerotic heart disease of native coronary artery with unspecified angina pectoris: Secondary | ICD-10-CM | POA: Diagnosis not present

## 2017-08-24 DIAGNOSIS — Z8673 Personal history of transient ischemic attack (TIA), and cerebral infarction without residual deficits: Secondary | ICD-10-CM | POA: Diagnosis not present

## 2017-08-24 DIAGNOSIS — I4891 Unspecified atrial fibrillation: Secondary | ICD-10-CM | POA: Diagnosis not present

## 2017-08-24 LAB — MYOCARDIAL PERFUSION IMAGING
LV dias vol: 161 mL (ref 62–150)
LV sys vol: 97 mL
Peak HR: 137 {beats}/min
RATE: 0.37
Rest HR: 90 {beats}/min
SDS: 4
SRS: 0
SSS: 4
TID: 1.05

## 2017-08-24 MED ORDER — IOPAMIDOL (ISOVUE-370) INJECTION 76%
100.0000 mL | Freq: Once | INTRAVENOUS | Status: AC | PRN
  Administered 2017-08-24: 100 mL via INTRAVENOUS

## 2017-08-24 MED ORDER — TECHNETIUM TC 99M TETROFOSMIN IV KIT
8.1000 | PACK | Freq: Once | INTRAVENOUS | Status: AC | PRN
  Administered 2017-08-24: 8.1 via INTRAVENOUS
  Filled 2017-08-24: qty 9

## 2017-08-24 MED ORDER — REGADENOSON 0.4 MG/5ML IV SOLN
0.4000 mg | Freq: Once | INTRAVENOUS | Status: AC
  Administered 2017-08-24: 0.4 mg via INTRAVENOUS

## 2017-08-24 MED ORDER — IOPAMIDOL (ISOVUE-370) INJECTION 76%
INTRAVENOUS | Status: AC
  Filled 2017-08-24: qty 100

## 2017-08-24 MED ORDER — TECHNETIUM TC 99M TETROFOSMIN IV KIT
30.5000 | PACK | Freq: Once | INTRAVENOUS | Status: AC | PRN
  Administered 2017-08-24: 30.5 via INTRAVENOUS
  Filled 2017-08-24: qty 31

## 2017-08-28 ENCOUNTER — Ambulatory Visit (INDEPENDENT_AMBULATORY_CARE_PROVIDER_SITE_OTHER): Payer: Medicare HMO | Admitting: Family Medicine

## 2017-08-28 ENCOUNTER — Encounter: Payer: Self-pay | Admitting: Family Medicine

## 2017-08-28 VITALS — BP 128/78 | HR 90 | Temp 98.5°F | Resp 16 | Ht 70.0 in | Wt 247.0 lb

## 2017-08-28 DIAGNOSIS — R0609 Other forms of dyspnea: Secondary | ICD-10-CM

## 2017-08-28 DIAGNOSIS — R0600 Dyspnea, unspecified: Secondary | ICD-10-CM

## 2017-08-28 MED ORDER — ALBUTEROL SULFATE HFA 108 (90 BASE) MCG/ACT IN AERS: 2.0000 | INHALATION_SPRAY | Freq: Four times a day (QID) | RESPIRATORY_TRACT | 0 refills | Status: DC | PRN

## 2017-08-28 NOTE — Progress Notes (Signed)
Subjective:    Patient ID: James Mcclain, male    DOB: 20-Jun-1945, 72 y.o.   MRN: [id number]  HPI  08/2016 Patient reports severe fatigue. He states that he just doesn't have any energy. Whenever he goes to try to exercise, he feels extremely weak and tired. He denies any chest pain. He denies any shortness of breath. He denies any dyspnea on exertion. He denies any orthopnea. He does occasionally have lightheadedness. He denies any weight loss. He is 248 pounds today which is the same as his weight in April. He denies any melena or hematochezia. He denies any dysuria or hematuria. He denies any rash or abdominal pain. He does have a history of prostate cancer status post prostate radiation therapy. He has a history of low testosterone but he is unable to take testosterone replacement due to his history of prostate cancer. This could be contributing to his fatigue. He is also on numerous antihypertensive medications at time his blood pressure is low at home and he is having some lightheadedness. For instance he is taking Cardura which could be contributing to some of the symptoms. He is due for lab work.  At that time, my plan was: I suspect that his chronic fatigue is secondary to hypogonadism. However we discussed that he cannot take testosterone replacement. I will check a testosterone level to confirm a suspicion. I'll also check a vitamin B12 level and as well as a TSH. Also check a CBC, CMP, hemoglobin A1c, and a sedimentation rate. Discontinue Cardura. Try over-the-counter vitamin B12 supplement 1000 g by mouth daily.  06/15/17 Since the patient's visit in July of last year, he has lost 2 pounds.  He continues to complain of chronic fatigue.  He denies any chest pain.  He denies any shortness of breath.  He denies any hemoptysis.  He denies any night sweats or fevers.  He denies any abdominal pain.  He denies any melena or hematochezia.  He denies any rashes.  He denies any bruising.  He denies any  fevers or chills.  He denies any dysuria.  Lab work in July of last year was significant only for a mildly suppressed testosterone level of 207.  However the patient cannot have treatment for this due to his history of prostate cancer.  He denies any hypersomnolence.  He denies any apnea episodes at night that wake him up.  However he does report feeling sleepy during the day.  He can fall asleep easily as a passenger in a car.  He states that he can fall asleep after eating meals.  He reads seldom but he could fall asleep with reading or watching TV.  At that time, my plan was: Patient's initial lab work was reassuring.  He has had the symptoms now for 9 months with no significant change in weight or physical exam findings.  This leads me to believe that there is not likely a serious underlying malignancy or infection causing his symptoms.  I will check a cortisol level to evaluate for adrenal insufficiency.  I will repeat his lab work including a CBC, CMP.  I will also obtain a baseline chest x-ray to look for lymphadenopathy in the chest or masses in the lungs.  If lab work above is normal, I would recommend a sleep study to evaluate for obstructive sleep apnea as a potential cause of his fatigue.  I explained to the patient that I cannot treat his testosterone deficiency due to his history of prostate  cancer.  Patient has not had a PSA checked in more than a year and therefore I will obtain that today while checking lab work  08/10/17 Patient has a history of diabetes mellitus type 2.  He is overdue to check a hemoglobin A1c.  His last hemoglobin A1c was checked about 10 months ago and was 6.1.  We stopped Lisinopril HCT and metformin due to recent elevations in his Cr.  His last BMP was in the end of April and was significant for a GFR of 49 mL of blood per minute.  However, patient never started plain lisinopril for renal protection as planned.  As result, his blood pressure is elevated.  However was more  concerning for me is that the patient continues to endorse severe fatigue.  However now he states that he is having dyspnea on exertion.  He states that whenever he does any strenuous activity he becomes extremely winded very fast.  He denies any angina.  He denies any chest pain.  However he states that yesterday he was walking up a hill to go into his church, and he almost passed out due to the strain of the activity.  He felt extremely lightheaded like he was going to pass out.  He also states that he is becoming progressively more winded with less activity.  On examination today, his heart rate is irregularly irregular.  However there is no JVD.  There is no evidence of fluid overload or pulmonary edema on his exam.  He also endorses occasional word finding difficulties.  Even today, he seems to be struggling to come up with the names of medications and certain common objects.  He states that this is been going on for quite a while.  Cranial nerves II through XII are grossly intact muscle strength is 5/5 equal and symmetric in the upper and lower extremities.  There is no pronator drift.  There is no facial asymmetry.  At that time, my plan was: First, regarding his diabetes, his creatinine is borderline to except metformin however when he was on the metformin his sugars are well controlled.  Given the issues of cost with name brand medication and his inability to afford those calls, I believe the patient should resume his metformin 1000 mg p.o. twice daily.  I will also check a hemoglobin A1c today and then likely recheck it in 3 months on the metformin.  If sugars remain elevated, consider Victoza next.  Regarding his fatigue, he is already seeing the neurologist and they are planning a sleep study.  However he is now endorsing significant dyspnea on exertion and near syncope on exertion.  Therefore I believe we need to get a cardiology consultation to evaluate for possible cardiac causes of fatigue, dyspnea  on exertion, and near syncope.  I would like the patient to resume lisinopril 40 mg a day for hypertension and renal protection from the diabetes.  EKG was obtained today in the office which reveals right bundle branch block but new left axis bifascicular block with downsloping ST depression in leads V2, V3, and V4.  This is insignificant change from his previous EKG in 2017.  He is also in trigeminy.  Therefore I will expedite cardiology consultation  08/28/17 Patient went for stress test under the care of cardiology.  I appreciate him getting an answer quickly.  Fortunately stress test revealed no evidence of ischemia however the patient had extremely poor exercise tolerance.  "Test was converted to pharmacologic due to the patient's  inability to exercise.  Patient attempted to walk on the treadmill. Became severely short of breath, with audible wheezing. Treadmill stopped changed to lexiscan. "  Echocardiogram revealed: Study Conclusions  - Left ventricle: Patient in rapid afib during exam The cavity size   was mildly dilated. Wall thickness was normal. Systolic function   was normal. The estimated ejection fraction was in the range of   50% to 55%. Left ventricular diastolic function parameters were   normal. - Aortic valve: There was trivial regurgitation. - Mitral valve: Calcified annulus. Mildly thickened leaflets . - Left atrium: The atrium was mildly dilated. - Atrial septum: No defect or patent foramen ovale was identified. Therefore the patient is here today to discuss a referral to pulmonology.  Patient had no history of asthma as a child.  He did never smoked.  He did not work in [employer], Marine scientist, [place], Chemical engineer, Social research officer, government. 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  He does have a sister with a history of sarcoidosis however a CT angiogram of the chest on July 1 revealed no PE, no mediastinal lymphadenopathy, and no evidence of pulmonary pathology.  Past Medical History:    Diagnosis Date  . Arthritis   . CKD (chronic kidney disease) stage 3, GFR 30-59 ml/min (HCC)   . Coronary artery disease    a. s/p Xience DES to the LAD 07/2012.  . Dilated aortic root ([place])    a. Mildly dilated by echo 09/2013, f/u MRA scheduled for 12/2014.  [name] Elevated PSA   . Essential hypertension   . History of kidney stones   . Hx of gout   . Hyperlipidemia   . Insulin dependent diabetes mellitus ([place])   . 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 On tele 09/2013.  [name] Paroxysmal atrial fibrillation (HCC)   . PFO (patent foramen ovale)    a. By echo 09/2013.  [name] Polycythemia   . Prostate cancer ([place]) 07/15/12   Gleason 7, external beam radiation Tx, Dr. Pilar Jarvis  . PVC's (premature ventricular contractions)   . Stroke [facility])    a. Cryptogenic, 09/2013, event monitor with NSR. Small PFO noted on echo but neg LE duplex.   Past Surgical History:  Procedure Laterality Date  . COLONOSCOPY WITH PROPOFOL N/A 02/22/2016   Procedure: COLONOSCOPY WITH PROPOFOL;  Surgeon: Carol Ada, MD;  Location: [facility] ENDOSCOPY;  Service: Endoscopy;  Laterality: N/A;  . CORONARY ANGIOPLASTY WITH STENT PLACEMENT  08/05/2012   LAD, 1 stent  . EP IMPLANTABLE DEVICE N/A 08/23/2014   Procedure: Loop Recorder Insertion;  Surgeon: Evans Lance, MD;  Location: [facility];  Service: Cardiovascular;  Laterality: N/A;  . KIDNEY STONES     2006 - LITHOTRIPSY  . PERCUTANEOUS CORONARY STENT INTERVENTION (PCI-S) N/A 08/05/2012   Procedure: PERCUTANEOUS CORONARY STENT INTERVENTION (PCI-S);  Surgeon: Sherren Mocha, MD;  Location: [facility] C[facility] LAB;  Service: Cardiovascular;  Laterality: N/A;  . PROSTATE BIOPSY  07/15/2012  . PROSTATE BIOPSY  09/23/2013  . TEE WITHOUT CARDIOVERSION N/A 09/27/2013   Procedure: TRANSESOPHAGEAL ECHOCARDIOGRAM (TEE);  Surgeon: Larey Dresser, MD;  Location: [facility];  Service: Cardiovascular;  Laterality: N/A;  . TONSILLECTOMY     as adult  . TOTAL KNEE ARTHROPLASTY  01/24/2011    Procedure: TOTAL KNEE ARTHROPLASTY;  Surgeon: Alta Corning;  Location: [place];  Service: Orthopedics;  Laterality: Left;  COMPUTER ASSISTED LEFT  TOTAL KNEE REPLACEMENT. Anesthesia a combination of regional and general.   Current Outpatient  Medications on File Prior to Visit  Medication Sig Dispense Refill  . allopurinol (ZYLOPRIM) 100 MG tablet TAKE TWO TABLETS BY MOUTH ONCE DAILY 60 tablet 5  . amLODipine (NORVASC) 10 MG tablet Take 1 tablet (10 mg total) by mouth daily. 90 tablet 3  . apixaban (ELIQUIS) 5 MG TABS tablet Take 1 tablet (5 mg total) by mouth 2 (two) times daily. 180 tablet 3  . aspirin 81 MG tablet Take 81 mg by mouth daily. Reported on 04/05/2015    . atorvastatin (LIPITOR) 40 MG tablet Take 1 tablet (40 mg total) by mouth daily. 90 tablet 1  . Blood Glucose Monitoring Suppl (ONETOUCH VERIO) w/Device KIT 1 Device by Does not apply route 2 (two) times daily. E11.9 1 kit 2  . carvedilol (COREG) 12.5 MG tablet Take 1 tablet (12.5 mg total) by mouth 2 (two) times daily. 180 tablet 3  . glucose blood test strip Check BS bid DX: E11.9 100 each 12  . Insulin Glargine (LANTUS SOLOSTAR) 100 UNIT/ML Solostar Pen Inject 45 Units into the skin daily. 15 mL 3  . Insulin Syringe-Needle U-100 (SAFETY INSULIN SYRINGES) 30G X 5/16" 0.5 ML MISC As needed for insulin 100 each 11  . lisinopril (PRINIVIL,ZESTRIL) 40 MG tablet Take 1 tablet (40 mg total) by mouth daily. 90 tablet 3  . metFORMIN (GLUCOPHAGE) 1000 MG tablet Take 1 tablet (1,000 mg total) by mouth 2 (two) times daily with a meal. 180 tablet 3  . sitaGLIPtin (JANUVIA) 100 MG tablet Take 1 tablet (100 mg total) by mouth daily. 30 tablet 3  . trimethoprim-polymyxin b (POLYTRIM) ophthalmic solution Place 2 drops into both eyes every 4 (four) hours. 10 mL 0   No current facility-administered medications on file prior to visit.    Allergies  Allergen Reactions  . Betadine [Povidone Iodine] Other (See Comments)    Patient doesn't remember    . Contrast Media [Iodinated Diagnostic Agents] Other (See Comments)    Patient doesn't remember  . Fish Allergy Swelling   Social History   Socioeconomic History  . Marital status: Single    Spouse name: Not on file  . Number of children: 3  . Years of education: 12th  . Highest education level: Not on file  Occupational History  . Occupation: retired  Scientific laboratory technician  . Financial resource strain: Not on file  . Food insecurity:    Worry: Not on file    Inability: Not on file  . Transportation needs:    Medical: Not on file    Non-medical: Not on file  Tobacco Use  . Smoking status: Never Smoker  . Smokeless tobacco: Never Used  Substance and Sexual Activity  . Alcohol use: No  . Drug use: No  . Sexual activity: Not Currently  Lifestyle  . Physical activity:    Days per week: Not on file    Minutes per session: Not on file  . Stress: Not on file  Relationships  . Social connections:    Talks on phone: Not on file    Gets together: Not on file    Attends religious service: Not on file    Active member of club or organization: Not on file    Attends meetings of clubs or organizations: Not on file    Relationship status: Not on file  . Intimate partner violence:    Fear of current or ex partner: Not on file    Emotionally abused: Not on file    Physically  abused: Not on file    Forced sexual activity: Not on file  Other Topics Concern  . Not on file  Social History Narrative   [name]   Patient is right handed   Patient drinks coffee and sodas      Review of Systems  All other systems reviewed and are negative.      Objective:   Physical Exam  Constitutional: He appears well-nourished. No distress.  HENT:  Mouth/Throat: No oropharyngeal exudate.  Neck: Neck supple. No JVD present. No thyromegaly present.  Cardiovascular: Normal rate and normal heart sounds. An irregularly irregular rhythm present. Exam reveals no gallop and  no friction rub.  No murmur heard. Pulmonary/Chest: Effort normal and breath sounds normal. No respiratory distress. He has no wheezes. He has no rales.  Abdominal: Soft. Bowel sounds are normal. He exhibits no distension. There is no tenderness. There is no rebound and no guarding.  Musculoskeletal: He exhibits no edema.  Lymphadenopathy:    He has no cervical adenopathy.  Skin: No rash noted. He is not diaphoretic.  Vitals reviewed.         Assessment & Plan:  Dyspnea on exertion  I will consult pulmonology for pulmonary function testing.  Meanwhile I will try the patient empirically on albuterol 2 puffs prior to physical exertion to see if he has an exercise induced asthma.  If the patient sees significant benefit from this, will consider a long-acting bronchodilator such as Symbicort, etc.

## 2017-08-30 ENCOUNTER — Ambulatory Visit (INDEPENDENT_AMBULATORY_CARE_PROVIDER_SITE_OTHER): Payer: Medicare HMO | Admitting: Neurology

## 2017-08-30 DIAGNOSIS — E291 Testicular hypofunction: Secondary | ICD-10-CM

## 2017-08-30 DIAGNOSIS — N182 Chronic kidney disease, stage 2 (mild): Secondary | ICD-10-CM

## 2017-08-30 DIAGNOSIS — G4733 Obstructive sleep apnea (adult) (pediatric): Secondary | ICD-10-CM

## 2017-08-30 DIAGNOSIS — Z87898 Personal history of other specified conditions: Secondary | ICD-10-CM

## 2017-08-30 DIAGNOSIS — I493 Ventricular premature depolarization: Secondary | ICD-10-CM

## 2017-08-30 DIAGNOSIS — R530 Neoplastic (malignant) related fatigue: Secondary | ICD-10-CM

## 2017-08-30 DIAGNOSIS — Z794 Long term (current) use of insulin: Secondary | ICD-10-CM

## 2017-08-30 DIAGNOSIS — I10 Essential (primary) hypertension: Secondary | ICD-10-CM

## 2017-08-30 DIAGNOSIS — E1122 Type 2 diabetes mellitus with diabetic chronic kidney disease: Secondary | ICD-10-CM

## 2017-08-30 DIAGNOSIS — I480 Paroxysmal atrial fibrillation: Secondary | ICD-10-CM

## 2017-09-06 NOTE — Procedures (Signed)
PATIENT'S NAME:  James Mcclain, James Mcclain DOB:      [birth year]      MR#:    [id number]     DATE OF RECORDING: 08/30/2017 REFERRING M.D.:  Jenna Luo, MD, Antony Contras, MD  Study Performed:   Baseline Polysomnogram HISTORY:  James Mcclain is a widowed ( his wife passed in 2017) African-American, right handed gentleman with a history of snoring. He had a stroke in Summer of 2016, was admitted by Dr. Leonie Man , followed by Dr Erlinda Hong and our clinical research team for another year. He had no new strokes since. [name] has felt increasingly tired in daytime without feeling sleepy.  He endorsed the Epworth Sleepiness Scale at 2 points, the fatigue severity scale at 49 points.  He states that he often is exhausted or even avoids certain activities and outings;  he used to love to play golf but he is to fatigued to enjoy it now.  He has been widowed for 2 years and sometimes is not easy to live alone, but he does not feel depressed. He has a history of prostate cancer and is status post radiation therapy, also has low testosterone but this cannot be replaced due to his prostate cancer history. The patient further has a history of coronary artery disease, a dilated aortic root, history of kidney stones and gout hyperlipidemia and insulin-dependent diabetes, multifocal atrial tachycardia and paroxysmal atrial fibrillation, he also was found to have a patent foramen ovale when worked up for his stroke in 2015.  He has polycythemia, prostate cancer with a Gleason score 7.  Arthritis, CKD, CAD, Dilated aortic root, Elevated PSA, Kidney stones, Gout, Hyperlipidemia, Diabetes, Atrial tachycardia, Atrial fibrillation, Prostate cancer, PVCs, Stroke  The patient endorsed the Epworth Sleepiness Scale at 2/24 points. FSS 49/63 points. The patient's weight 245 pounds with a height of 70 (inches), resulting in a BMI of 35. 1kg/m2. The patient's neck circumference measured 16.5 inches.  CURRENT MEDICATIONS: Zyloprim, Norvasc, Eliquis, Aspirin,  Lipitor, Coreg, Lantus, Prinivil, Polytrim,   PROCEDURE:  This is a multichannel digital polysomnogram utilizing the Somnostar 11.2 system.  Electrodes and sensors were applied and monitored per AASM Specifications.   EEG, EOG, Chin and Limb EMG, were sampled at 200 Hz.  ECG, Snore and Nasal Pressure, Thermal Airflow, Respiratory Effort, CPAP Flow and Pressure, Oximetry was sampled at 50 Hz. Digital video and audio were recorded.      BASELINE STUDY Lights Out was at 21:59 and Lights On at 04:59.  Total recording time (TRT) was 421 minutes, with a total sleep time (TST) of 268 minutes.   The patient's sleep latency was 35 minutes.  REM latency was 219 minutes.  The sleep efficiency was just 63.7 %.     SLEEP ARCHITECTURE: WASO (Wake after sleep onset) was 55.5 minutes.  There were 8 minutes in Stage N1, 229.5 minutes Stage N2, 7.5 minutes Stage N3 and 23 minutes in Stage REM.  The percentage of Stage N1 was 3.%, Stage N2 was 85.6%, Stage N3 was 2.8% and Stage R (REM sleep) was 8.6%.  RESPIRATORY ANALYSIS:  There were a total of 68 respiratory events:  5 obstructive apneas, 0 central apneas and 63 hypopneas without respiratory event related arousals (RERAs).     The total APNEA/HYPOPNEA INDEX (AHI) was 15.2/hour and the total RESPIRATORY DISTURBANCE INDEX was 15.2 /hour.  20 events occurred in REM sleep and 88 events in NREM. The REM AHI was 52.2 /hour, versus a non-REM AHI of 11.8. The patient  spent 231.5 minutes of total sleep time in the supine position and 37 minutes in non-supine. The supine AHI was 16.6/h, versus a non-supine AHI of 6.6.  OXYGEN SATURATION & C02:  The Wake baseline 02 saturation was 94%, with the lowest being 79%. Time spent below 89% saturation equaled 23 minutes.   PERIODIC LIMB MOVEMENTS: The patient had a total of 0 Periodic Limb Movements.  The arousals were noted as: 23 were spontaneous, 0 were associated with PLMs, and 14 were associated with respiratory events. Audio and  video analysis did not show any abnormal or unusual movements, behaviors, phonations or vocalizations.   One bathroom break. The patient appeared restless.  Moderate Snoring was noted. EKG was irregular with PAC/PVCs and variable heart rate.  Post-study, the patient indicated that sleep was the same as usual.    IMPRESSION:  1. Mild Obstructive Sleep Apnea (OSA) with accentuation during REM sleep to AHI of 52.2/h.  2. The patient remained with an irregular heart rate throughout the night.   3. No Periodic Limb Movement Disorder (PLMD) 4. Snoring   RECOMMENDATIONS:  1. Advise full-night, attended, CPAP titration study to optimize therapy in atrial fibrillation patient with CKD, CAD and aortic valve disease.     I certify that I have reviewed the entire raw data recording prior to the issuance of this report in accordance with the Standards of Accreditation of the American Academy of Sleep Medicine (AASM)     Larey Seat, MD    09-06-2017  Diplomat, American Board of Psychiatry and Neurology  Diplomat, American Board of Sleep Medicine Fellow of the AASM and [employer], [employer] [facility] [employer]

## 2017-09-06 NOTE — Addendum Note (Signed)
Addended by: Larey Seat on: 09/06/2017 06:40 PM   Modules accepted: Orders

## 2017-09-07 ENCOUNTER — Telehealth: Payer: Self-pay | Admitting: Neurology

## 2017-09-07 NOTE — Telephone Encounter (Signed)
-----   Message from Larey Seat, MD sent at 09/06/2017  6:40 PM EDT ----- 1. Mild Obstructive Sleep Apnea (OSA) with accentuation during  REM sleep to AHI of 52.2/h.  2. The patient remained with an irregular heart rate throughout  the night.  3. No Periodic Limb Movement Disorder (PLMD) 4. Snoring   RECOMMENDATIONS:  1. Advise full-night, attended, CPAP titration study to optimize  therapy in atrial fibrillation patient with CKD, CAD and aortic  valve disease.

## 2017-09-07 NOTE — Telephone Encounter (Signed)

## 2017-09-08 ENCOUNTER — Encounter: Payer: Self-pay | Admitting: *Deleted

## 2017-09-08 DIAGNOSIS — R5383 Other fatigue: Secondary | ICD-10-CM | POA: Diagnosis not present

## 2017-09-08 DIAGNOSIS — R0989 Other specified symptoms and signs involving the circulatory and respiratory systems: Secondary | ICD-10-CM | POA: Diagnosis not present

## 2017-09-08 DIAGNOSIS — R072 Precordial pain: Secondary | ICD-10-CM | POA: Diagnosis not present

## 2017-09-08 DIAGNOSIS — R0602 Shortness of breath: Secondary | ICD-10-CM | POA: Diagnosis not present

## 2017-09-08 DIAGNOSIS — E559 Vitamin D deficiency, unspecified: Secondary | ICD-10-CM | POA: Diagnosis not present

## 2017-09-09 ENCOUNTER — Telehealth: Payer: Self-pay | Admitting: Cardiology

## 2017-09-09 NOTE — Telephone Encounter (Signed)
Spoke with pt who states who states on Sunday while walking in to church he experienced left sided chest pain that only last a few seconds. He reports since then he has been feeling bad and tired. Appointment was scheduled for 09/16/17 at 11 am with Fabian Sharp, PA. Pt also advised if he start experiencing chest pain again between now and appointment date, to report to the ED for further evaluations. Pt verbalized understanding. Routing to MD for further recommendations.

## 2017-09-09 NOTE — Telephone Encounter (Signed)
New Message:       Pt c/o of Chest Pain: STAT if CP now or developed within 24 hours  1. Are you having CP right now? No  2. Are you experiencing any other symptoms (ex. SOB, nausea, vomiting, sweating)? no  3. How long have you been experiencing CP? 2 days  4. Is your CP continuous or coming and going? Coming and going  5. Have you taken Nitroglycerin? No ?

## 2017-09-10 NOTE — Telephone Encounter (Signed)
Agree James Mcclain  

## 2017-09-15 DIAGNOSIS — E119 Type 2 diabetes mellitus without complications: Secondary | ICD-10-CM | POA: Diagnosis not present

## 2017-09-15 DIAGNOSIS — R5383 Other fatigue: Secondary | ICD-10-CM | POA: Diagnosis not present

## 2017-09-15 NOTE — Progress Notes (Addendum)
Cardiology Office Note:    Date:  09/16/2017   ID:  James Mcclain, DOB 1946/01/20, MRN [id number]  PCP:  [name], FNP  Cardiologist:  Kirk Ruths, MD   Referring MD: Susy Frizzle, MD   Chief Complaint  Patient presents with  . Follow-up    dyspnea    History of Present Illness:    James Mcclain is a 72 y.o. male with a hx of CAD, HTN, PFO, PVCs, hx of stroke, paroxysmal atrial fibrillation, and DM2 on insulin. Stroke in 2015 treated with TPA. Follow up loop recorder showed paroxysmal Afib and he was placed on anticoagulation. Heart cath 07/2012 with DES to LAD.    Pt recently saw Dr. Stanford Breed in clinic on 08/12/17. At that time, he complained of dyspnea. Pt underwent myoview stress test and echocardiogram after reporting dyspnea. Myoview negative for ischemia, possible reduced LVEF. Echo confirms normal LVEF of 50-55%. He also had repeat CTA to evaluate aneurysm, which showed stable ascending aortic aneurysm of 4.5 cm. He will need semi-annual CTA or MRA with possible referral to TCTS in the future.   He presents today for follow up. He continues to struggle with dyspnea on exertion and now reports two episodes of chest pain. He does not recall what he was doing during the first episode. Chest pain lasted about 30 seconds and was left-sided chest pain. Last week, he was walking up a hill at church and had crushing left-sided chest pain. He became diaphoretic and short of breath. He rested and the chest pain relieved in about 30 seconds. He continues to struggle with dyspnea on exertion. He also reports left arm numbness and tingling at random times, not clearly associated with activity or rest. He denies palpitations and is not having bleeding problems on eliquis.  Past Medical History:  Diagnosis Date  . Arthritis   . CKD (chronic kidney disease) stage 3, GFR 30-59 ml/min (HCC)   . Coronary artery disease    a. s/p Xience DES to the LAD 07/2012.  . Dilated aortic root ([place])     a. Mildly dilated by echo 09/2013, f/u MRA scheduled for 12/2014.  [name] Elevated PSA   . Essential hypertension   . History of kidney stones   . Hx of gout   . Hyperlipidemia   . Insulin dependent diabetes mellitus ([place])   . 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 On tele 09/2013.  [name] Paroxysmal atrial fibrillation (HCC)   . PFO (patent foramen ovale)    a. By echo 09/2013.  [name] Polycythemia   . 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 Pilar Jarvis  . PVC's (premature ventricular contractions)   . Stroke [facility])    a. Cryptogenic, 09/2013, event monitor with NSR. Small PFO noted on echo but neg LE duplex.    Past Surgical History:  Procedure Laterality Date  . COLONOSCOPY WITH PROPOFOL N/A 02/22/2016   Procedure: COLONOSCOPY WITH PROPOFOL;  Surgeon: Carol Ada, MD;  Location: [facility];  Service: Endoscopy;  Laterality: N/A;  . CORONARY ANGIOPLASTY WITH STENT PLACEMENT  08/05/2012   LAD, 1 stent  . EP IMPLANTABLE DEVICE N/A 08/23/2014   Procedure: Loop Recorder Insertion;  Surgeon: Evans Lance, MD;  Location: [facility];  Service: Cardiovascular;  Laterality: N/A;  . KIDNEY STONES     2006 - LITHOTRIPSY  . PERCUTANEOUS CORONARY STENT INTERVENTION (PCI-S) N/A 08/05/2012   Procedure: PERCUTANEOUS CORONARY STENT INTERVENTION (PCI-S);  Surgeon: Sherren Mocha, MD;  Location: [facility] CATH LAB;  Service: Cardiovascular;  Laterality: N/A;  . PROSTATE BIOPSY  07/15/2012  . PROSTATE BIOPSY  09/23/2013  . TEE WITHOUT CARDIOVERSION N/A 09/27/2013   Procedure: TRANSESOPHAGEAL ECHOCARDIOGRAM (TEE);  Surgeon: Larey Dresser, MD;  Location: [facility];  Service: Cardiovascular;  Laterality: N/A;  . TONSILLECTOMY     as adult  . TOTAL KNEE ARTHROPLASTY  01/24/2011   Procedure: TOTAL KNEE ARTHROPLASTY;  Surgeon: Alta Corning;  Location: [place];  Service: Orthopedics;  Laterality: Left;  COMPUTER ASSISTED LEFT  TOTAL KNEE REPLACEMENT. Anesthesia a combination of  regional and general.    Current Medications: Current Meds  Medication Sig  . albuterol (PROVENTIL HFA;VENTOLIN HFA) 108 (90 Base) MCG/ACT inhaler Inhale 2 puffs into the lungs every 6 (six) hours as needed for wheezing or shortness of breath.  . allopurinol (ZYLOPRIM) 100 MG tablet TAKE TWO TABLETS BY MOUTH ONCE DAILY  . amLODipine (NORVASC) 10 MG tablet Take 1 tablet (10 mg total) by mouth daily.  [name] apixaban (ELIQUIS) 5 MG TABS tablet Take 1 tablet (5 mg total) by mouth 2 (two) times daily.  [name] aspirin 81 MG tablet Take 81 mg by mouth daily. Reported on 04/05/2015  . atorvastatin (LIPITOR) 40 MG tablet Take 1 tablet (40 mg total) by mouth daily.  . Blood Glucose Monitoring Suppl (ONETOUCH VERIO) w/Device KIT 1 Device by Does not apply route 2 (two) times daily. E11.9  . carvedilol (COREG) 12.5 MG tablet Take 1 tablet (12.5 mg total) by mouth 2 (two) times daily.  [name] glucose blood test strip Check BS bid DX: E11.9  . Insulin Glargine (LANTUS SOLOSTAR) 100 UNIT/ML Solostar Pen Inject 45 Units into the skin daily.  . Insulin Syringe-Needle U-100 (SAFETY INSULIN SYRINGES) 30G X 5/16" 0.5 ML MISC As needed for insulin  . lisinopril (PRINIVIL,ZESTRIL) 40 MG tablet Take 1 tablet (40 mg total) by mouth daily.  . metFORMIN (GLUCOPHAGE) 1000 MG tablet Take 1 tablet (1,000 mg total) by mouth 2 (two) times daily with a meal.  . sitaGLIPtin (JANUVIA) 100 MG tablet Take 1 tablet (100 mg total) by mouth daily.  [name] trimethoprim-polymyxin b (POLYTRIM) ophthalmic solution Place 2 drops into both eyes every 4 (four) hours.     Allergies:   Betadine [povidone iodine]; Contrast media [iodinated diagnostic agents]; and Fish allergy   Social History   Socioeconomic History  . Marital status: Single    Spouse name: Not on file  . Number of children: 3  . Years of education: 12th  . Highest education level: Not on file  Occupational History  . Occupation: retired  Scientific laboratory technician  . Financial resource strain:  Not on file  . Food insecurity:    Worry: Not on file    Inability: Not on file  . Transportation needs:    Medical: Not on file    Non-medical: Not on file  Tobacco Use  . Smoking status: Never Smoker  . Smokeless tobacco: Never Used  Substance and Sexual Activity  . Alcohol use: No  . Drug use: No  . Sexual activity: Not Currently  Lifestyle  . Physical activity:    Days per week: Not on file    Minutes per session: Not on file  . Stress: Not on file  Relationships  . Social connections:    Talks on phone: Not on file    Gets together: Not on file    Attends religious service: Not on file  Active member of club or organization: Not on file    Attends meetings of clubs or organizations: Not on file    Relationship status: Not on file  Other Topics Concern  . Not on file  Social History Narrative   [name]   Patient is right handed   Patient drinks coffee and sodas     Family History: The patient's family history includes Cancer in his mother; Cervical cancer in his sister; Diabetes in his sister; Heart Problems in his brother, father, and unknown relative; Hypertension in his brother; Leukemia in his mother; Stroke in his brother and unknown relative. There is no history of Heart attack.  ROS:   Please see the history of present illness.    All other systems reviewed and are negative.  EKGs/Labs/Other Studies Reviewed:    The following studies were reviewed today:  Myoview 08/24/17:  No ST changes to suggest ischemia. Frequent PACs, PVCs through study  Small inferior/inferolateral defect consistent with scar and/or soft tissue attenuation. No ischemia  LVEF 40% Compared to previous study LVEF is down. REcommend echo to further define LVEF/ wall motion.  This is an intermediate risk study.    Echo 08/24/17: Study Conclusions - Left ventricle: Patient in rapid afib during exam The cavity size   was mildly dilated. Wall thickness  was normal. Systolic function   was normal. The estimated ejection fraction was in the range of   50% to 55%. Left ventricular diastolic function parameters were   normal. - Aortic valve: There was trivial regurgitation. - Mitral valve: Calcified annulus. Mildly thickened leaflets . - Left atrium: The atrium was mildly dilated. - Atrial septum: No defect or patent foramen ovale was identified.   Cath 2014 Coronary angiography:   Left mainstem: Short, no significant disease.  Left anterior descending (LAD): There is a 90% proximal LAD stenosis between small 1st and 2nd diagonals.  Left circumflex (LCx): Large system. Moderate ramus with 30% proximal disease. LCx with 30% mid vessel stenosis.  Right coronary artery (RCA): Relatively small, codominant RCA. 40% mid RCA stenosis.  Left ventriculography: Not done. Recent echo with normal EF.   Final Conclusions: 90% proximal LAD stenosis. The patient has had significant exertional dyspnea for several weeks which appears to be his anginal equivalent. CCS class III angina (with mild exertion). He is on good doses of Coreg and amlodipine. I think that PCI is warranted in this situation for symptom management.  Major comorbidity is prostate cancer. Plan for probable seed implantation.  Recommendations: Patient will go to the main lab for PCI today. Will give Plavix 600 mg x 1.   Loralie Champagne  08/05/2012, 1:32 PM  PCI data: Conclusions: Successful PCI of the LAD  Recommendations: DAPT with aspirin and plavix for 12 months, then indefinite aspirin.  Sherren Mocha  08/05/2012, 10:51 PM   EKG:  EKG is ordered today.  The ekg ordered today demonstrates sinus with RBBB (not new)   Recent Labs: 06/15/2017: ALT 9; Hemoglobin 16.7; Platelets 360 08/12/2017: BUN 20; Creatinine, Ser 1.62; Potassium 4.8; Sodium 142  Recent Lipid Panel    Component Value Date/Time   CHOL 103 (L) 11/20/2015 0818   TRIG 82 11/20/2015 0818   HDL 32 (L)  11/20/2015 0818   CHOLHDL 3.2 11/20/2015 0818   VLDL 16 11/20/2015 0818   LDLCALC 55 11/20/2015 0818    Physical Exam:    VS:  BP (!) 142/80 (BP Location: Left Arm, Patient Position:  Sitting)   Pulse 82   Ht _0  (1.778 m)   Wt 243 lb 9.6 oz (110.5 kg)   BMI 34.95 kg/m     Wt Readings from Last 3 Encounters:  09/16/17 243 lb 9.6 oz (110.5 kg)  08/28/17 247 lb (112 kg)  08/24/17 274 lb (124.3 kg)     GEN: Well nourished, well developed in no acute distress HEENT: Normal NECK: No JVD; No carotid bruits LYMPHATICS: No lymphadenopathy CARDIAC: RRR, no murmurs, rubs, gallops RESPIRATORY:  Clear to auscultation without rales, wheezing or rhonchi  ABDOMEN: Soft, non-tender, non-distended MUSCULOSKELETAL:  No edema; No deformity  SKIN: Warm and dry NEUROLOGIC:  Alert and oriented x 3 PSYCHIATRIC:  Normal affect   ASSESSMENT:    1. Unstable angina ([place])   2. Coronary artery disease involving native coronary artery of native heart without angina pectoris   3. Paroxysmal atrial fibrillation (HCC)   4. Essential hypertension   5. Pure hypercholesterolemia    PLAN:    In order of problems listed above:  Coronary artery disease, s/p DES to LAD in 2014, myoview 2019 with evidence of scar; now with questionable unstable angina Pt reports symptoms concerning for unstable angina, since he thinks he was at rest during 1 of 2 episodes of left-sided crushing chest pain. Chest pain associated with activity and diaphoresis and SOB, relieved with rest. Given his known disease and progressing symptoms, will set up for heart cath, no LV gram. BMP and CBC today for heart cath.  The patient understands that risks included but are not limited to stroke (1 in 1000), death (1 in 8), kidney failure [usually temporary] (1 in 500), bleeding (1 in 200), allergic reaction [possibly serious] (1 in 200).  The patient understands and agrees to proceed.    Paroxysmal atrial fibrillation In sinus  today. No palpitations and no bleeding problems on eliquis. No medication changes.    HTN Lisinopril, coreg, norvasc Pressure mildly elevated. Will monitor and make medication changes as needed after heart cath.    HLD with LDL goal less than 70 Continue statin.    Medication Adjustments/Labs and Tests Ordered: Current medicines are reviewed at length with the patient today.  Concerns regarding medicines are outlined above.  Orders Placed This Encounter  Procedures  . Basic metabolic panel  . CBC  . EKG 12-Lead   No orders of the defined types were placed in this encounter.   Signed, Ledora Bottcher, [place]  09/16/2017 12:36 PM    [facility]

## 2017-09-15 NOTE — H&P (View-Only) (Signed)
Cardiology Office Note:    Date:  09/16/2017   ID:  James Mcclain, DOB 1946/01/20, MRN [id number]  PCP:  [name], FNP  Cardiologist:  Kirk Ruths, MD   Referring MD: Susy Frizzle, MD   Chief Complaint  Patient presents with  . Follow-up    dyspnea    History of Present Illness:    James Mcclain is a 72 y.o. male with a hx of CAD, HTN, PFO, PVCs, hx of stroke, paroxysmal atrial fibrillation, and DM2 on insulin. Stroke in 2015 treated with TPA. Follow up loop recorder showed paroxysmal Afib and he was placed on anticoagulation. Heart cath 07/2012 with DES to LAD.    Pt recently saw Dr. Stanford Breed in clinic on 08/12/17. At that time, he complained of dyspnea. Pt underwent myoview stress test and echocardiogram after reporting dyspnea. Myoview negative for ischemia, possible reduced LVEF. Echo confirms normal LVEF of 50-55%. He also had repeat CTA to evaluate aneurysm, which showed stable ascending aortic aneurysm of 4.5 cm. He will need semi-annual CTA or MRA with possible referral to TCTS in the future.   He presents today for follow up. He continues to struggle with dyspnea on exertion and now reports two episodes of chest pain. He does not recall what he was doing during the first episode. Chest pain lasted about 30 seconds and was left-sided chest pain. Last week, he was walking up a hill at church and had crushing left-sided chest pain. He became diaphoretic and short of breath. He rested and the chest pain relieved in about 30 seconds. He continues to struggle with dyspnea on exertion. He also reports left arm numbness and tingling at random times, not clearly associated with activity or rest. He denies palpitations and is not having bleeding problems on eliquis.  Past Medical History:  Diagnosis Date  . Arthritis   . CKD (chronic kidney disease) stage 3, GFR 30-59 ml/min (HCC)   . Coronary artery disease    a. s/p Xience DES to the LAD 07/2012.  . Dilated aortic root ([place])     a. Mildly dilated by echo 09/2013, f/u MRA scheduled for 12/2014.  [name] Elevated PSA   . Essential hypertension   . History of kidney stones   . Hx of gout   . Hyperlipidemia   . Insulin dependent diabetes mellitus ([place])   . 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 On tele 09/2013.  [name] Paroxysmal atrial fibrillation (HCC)   . PFO (patent foramen ovale)    a. By echo 09/2013.  [name] Polycythemia   . 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 Pilar Jarvis  . PVC's (premature ventricular contractions)   . Stroke [facility])    a. Cryptogenic, 09/2013, event monitor with NSR. Small PFO noted on echo but neg LE duplex.    Past Surgical History:  Procedure Laterality Date  . COLONOSCOPY WITH PROPOFOL N/A 02/22/2016   Procedure: COLONOSCOPY WITH PROPOFOL;  Surgeon: Carol Ada, MD;  Location: [facility];  Service: Endoscopy;  Laterality: N/A;  . CORONARY ANGIOPLASTY WITH STENT PLACEMENT  08/05/2012   LAD, 1 stent  . EP IMPLANTABLE DEVICE N/A 08/23/2014   Procedure: Loop Recorder Insertion;  Surgeon: Evans Lance, MD;  Location: [facility] CV LAB;  Service: Cardiovascular;  Laterality: N/A;  . KIDNEY STONES     2006 - LITHOTRIPSY  . PERCUTANEOUS CORONARY STENT INTERVENTION (PCI-S) N/A 08/05/2012   Procedure: PERCUTANEOUS CORONARY STENT INTERVENTION (PCI-S);  Surgeon: Sherren Mocha, MD;  Location: [facility] CATH LAB;  Service: Cardiovascular;  Laterality: N/A;  . PROSTATE BIOPSY  07/15/2012  . PROSTATE BIOPSY  09/23/2013  . TEE WITHOUT CARDIOVERSION N/A 09/27/2013   Procedure: TRANSESOPHAGEAL ECHOCARDIOGRAM (TEE);  Surgeon: Larey Dresser, MD;  Location: [facility];  Service: Cardiovascular;  Laterality: N/A;  . TONSILLECTOMY     as adult  . TOTAL KNEE ARTHROPLASTY  01/24/2011   Procedure: TOTAL KNEE ARTHROPLASTY;  Surgeon: Alta Corning;  Location: [facility];  Service: Orthopedics;  Laterality: Left;  COMPUTER ASSISTED LEFT  TOTAL KNEE REPLACEMENT. Anesthesia a combination of  regional and general.    Current Medications: Current Meds  Medication Sig  . albuterol (PROVENTIL HFA;VENTOLIN HFA) 108 (90 Base) MCG/ACT inhaler Inhale 2 puffs into the lungs every 6 (six) hours as needed for wheezing or shortness of breath.  . allopurinol (ZYLOPRIM) 100 MG tablet TAKE TWO TABLETS BY MOUTH ONCE DAILY  . amLODipine (NORVASC) 10 MG tablet Take 1 tablet (10 mg total) by mouth daily.  [name] apixaban (ELIQUIS) 5 MG TABS tablet Take 1 tablet (5 mg total) by mouth 2 (two) times daily.  [name] aspirin 81 MG tablet Take 81 mg by mouth daily. Reported on 04/05/2015  . atorvastatin (LIPITOR) 40 MG tablet Take 1 tablet (40 mg total) by mouth daily.  . Blood Glucose Monitoring Suppl (ONETOUCH VERIO) w/Device KIT 1 Device by Does not apply route 2 (two) times daily. E11.9  . carvedilol (COREG) 12.5 MG tablet Take 1 tablet (12.5 mg total) by mouth 2 (two) times daily.  [name] glucose blood test strip Check BS bid DX: E11.9  . Insulin Glargine (LANTUS SOLOSTAR) 100 UNIT/ML Solostar Pen Inject 45 Units into the skin daily.  . Insulin Syringe-Needle U-100 (SAFETY INSULIN SYRINGES) 30G X 5/16" 0.5 ML MISC As needed for insulin  . lisinopril (PRINIVIL,ZESTRIL) 40 MG tablet Take 1 tablet (40 mg total) by mouth daily.  . metFORMIN (GLUCOPHAGE) 1000 MG tablet Take 1 tablet (1,000 mg total) by mouth 2 (two) times daily with a meal.  . sitaGLIPtin (JANUVIA) 100 MG tablet Take 1 tablet (100 mg total) by mouth daily.  [name] trimethoprim-polymyxin b (POLYTRIM) ophthalmic solution Place 2 drops into both eyes every 4 (four) hours.     Allergies:   Betadine [povidone iodine]; Contrast media [iodinated diagnostic agents]; and Fish allergy   Social History   Socioeconomic History  . Marital status: Single    Spouse name: Not on file  . Number of children: 3  . Years of education: 12th  . Highest education level: Not on file  Occupational History  . Occupation: retired  Scientific laboratory technician  . Financial resource strain:  Not on file  . Food insecurity:    Worry: Not on file    Inability: Not on file  . Transportation needs:    Medical: Not on file    Non-medical: Not on file  Tobacco Use  . Smoking status: Never Smoker  . Smokeless tobacco: Never Used  Substance and Sexual Activity  . Alcohol use: No  . Drug use: No  . Sexual activity: Not Currently  Lifestyle  . Physical activity:    Days per week: Not on file    Minutes per session: Not on file  . Stress: Not on file  Relationships  . Social connections:    Talks on phone: Not on file    Gets together: Not on file    Attends religious service: Not on file  Active member of club or organization: Not on file    Attends meetings of clubs or organizations: Not on file    Relationship status: Not on file  Other Topics Concern  . Not on file  Social History Narrative   [name]   Patient is right handed   Patient drinks coffee and sodas     Family History: The patient's family history includes Cancer in his mother; Cervical cancer in his sister; Diabetes in his sister; Heart Problems in his brother, father, and unknown relative; Hypertension in his brother; Leukemia in his mother; Stroke in his brother and unknown relative. There is no history of Heart attack.  ROS:   Please see the history of present illness.    All other systems reviewed and are negative.  EKGs/Labs/Other Studies Reviewed:    The following studies were reviewed today:  Myoview 08/24/17:  No ST changes to suggest ischemia. Frequent PACs, PVCs through study  Small inferior/inferolateral defect consistent with scar and/or soft tissue attenuation. No ischemia  LVEF 40% Compared to previous study LVEF is down. REcommend echo to further define LVEF/ wall motion.  This is an intermediate risk study.    Echo 08/24/17: Study Conclusions - Left ventricle: Patient in rapid afib during exam The cavity size   was mildly dilated. Wall thickness  was normal. Systolic function   was normal. The estimated ejection fraction was in the range of   50% to 55%. Left ventricular diastolic function parameters were   normal. - Aortic valve: There was trivial regurgitation. - Mitral valve: Calcified annulus. Mildly thickened leaflets . - Left atrium: The atrium was mildly dilated. - Atrial septum: No defect or patent foramen ovale was identified.   Cath 2014 Coronary angiography:   Left mainstem: Short, no significant disease.  Left anterior descending (LAD): There is a 90% proximal LAD stenosis between small 1st and 2nd diagonals.  Left circumflex (LCx): Large system. Moderate ramus with 30% proximal disease. LCx with 30% mid vessel stenosis.  Right coronary artery (RCA): Relatively small, codominant RCA. 40% mid RCA stenosis.  Left ventriculography: Not done. Recent echo with normal EF.   Final Conclusions: 90% proximal LAD stenosis. The patient has had significant exertional dyspnea for several weeks which appears to be his anginal equivalent. CCS class III angina (with mild exertion). He is on good doses of Coreg and amlodipine. I think that PCI is warranted in this situation for symptom management.  Major comorbidity is prostate cancer. Plan for probable seed implantation.  Recommendations: Patient will go to the main lab for PCI today. Will give Plavix 600 mg x 1.   Loralie Champagne  08/05/2012, 1:32 PM  PCI data: Conclusions: Successful PCI of the LAD  Recommendations: DAPT with aspirin and plavix for 12 months, then indefinite aspirin.  Sherren Mocha  08/05/2012, 10:51 PM   EKG:  EKG is ordered today.  The ekg ordered today demonstrates sinus with RBBB (not new)   Recent Labs: 06/15/2017: ALT 9; Hemoglobin 16.7; Platelets 360 08/12/2017: BUN 20; Creatinine, Ser 1.62; Potassium 4.8; Sodium 142  Recent Lipid Panel    Component Value Date/Time   CHOL 103 (L) 11/20/2015 0818   TRIG 82 11/20/2015 0818   HDL 32 (L)  11/20/2015 0818   CHOLHDL 3.2 11/20/2015 0818   VLDL 16 11/20/2015 0818   LDLCALC 55 11/20/2015 0818    Physical Exam:    VS:  BP (!) 142/80 (BP Location: Left Arm, Patient Position:  Sitting)   Pulse 82   Ht _0  (1.778 m)   Wt 243 lb 9.6 oz (110.5 kg)   BMI 34.95 kg/m     Wt Readings from Last 3 Encounters:  09/16/17 243 lb 9.6 oz (110.5 kg)  08/28/17 247 lb (112 kg)  08/24/17 274 lb (124.3 kg)     GEN: Well nourished, well developed in no acute distress HEENT: Normal NECK: No JVD; No carotid bruits LYMPHATICS: No lymphadenopathy CARDIAC: RRR, no murmurs, rubs, gallops RESPIRATORY:  Clear to auscultation without rales, wheezing or rhonchi  ABDOMEN: Soft, non-tender, non-distended MUSCULOSKELETAL:  No edema; No deformity  SKIN: Warm and dry NEUROLOGIC:  Alert and oriented x 3 PSYCHIATRIC:  Normal affect   ASSESSMENT:    1. Unstable angina ([place])   2. Coronary artery disease involving native coronary artery of native heart without angina pectoris   3. Paroxysmal atrial fibrillation (HCC)   4. Essential hypertension   5. Pure hypercholesterolemia    PLAN:    In order of problems listed above:  Coronary artery disease, s/p DES to LAD in 2014, myoview 2019 with evidence of scar; now with questionable unstable angina Pt reports symptoms concerning for unstable angina, since he thinks he was at rest during 1 of 2 episodes of left-sided crushing chest pain. Chest pain associated with activity and diaphoresis and SOB, relieved with rest. Given his known disease and progressing symptoms, will set up for heart cath, no LV gram. BMP and CBC today for heart cath.  The patient understands that risks included but are not limited to stroke (1 in 1000), death (1 in 8), kidney failure [usually temporary] (1 in 500), bleeding (1 in 200), allergic reaction [possibly serious] (1 in 200).  The patient understands and agrees to proceed.    Paroxysmal atrial fibrillation In sinus  today. No palpitations and no bleeding problems on eliquis. No medication changes.    HTN Lisinopril, coreg, norvasc Pressure mildly elevated. Will monitor and make medication changes as needed after heart cath.    HLD with LDL goal less than 70 Continue statin.    Medication Adjustments/Labs and Tests Ordered: Current medicines are reviewed at length with the patient today.  Concerns regarding medicines are outlined above.  Orders Placed This Encounter  Procedures  . Basic metabolic panel  . CBC  . EKG 12-Lead   No orders of the defined types were placed in this encounter.   Signed, Ledora Bottcher, [place]  09/16/2017 12:36 PM    [facility]

## 2017-09-16 ENCOUNTER — Encounter: Payer: Self-pay | Admitting: Physician Assistant

## 2017-09-16 ENCOUNTER — Ambulatory Visit: Payer: Medicare HMO | Admitting: Physician Assistant

## 2017-09-16 VITALS — BP 142/80 | HR 82 | Ht 70.0 in | Wt 243.6 lb

## 2017-09-16 DIAGNOSIS — E7800 Pure hypercholesterolemia, unspecified: Secondary | ICD-10-CM

## 2017-09-16 DIAGNOSIS — I10 Essential (primary) hypertension: Secondary | ICD-10-CM

## 2017-09-16 DIAGNOSIS — I480 Paroxysmal atrial fibrillation: Secondary | ICD-10-CM | POA: Diagnosis not present

## 2017-09-16 DIAGNOSIS — I2510 Atherosclerotic heart disease of native coronary artery without angina pectoris: Secondary | ICD-10-CM

## 2017-09-16 DIAGNOSIS — I200 Unstable angina: Secondary | ICD-10-CM

## 2017-09-16 NOTE — Patient Instructions (Addendum)
Medication Instructions:  Your physician recommends that you continue on your current medications as directed. Please refer to the Current Medication list given to you today.   Labwork: TODAY: BMET & CBC  Testing/Procedures: Your physician has requested that you have a cardiac catheterization. Cardiac catheterization is used to diagnose and/or treat various heart conditions. Doctors may recommend this procedure for a number of different reasons. The most common reason is to evaluate chest pain. Chest pain can be a symptom of coronary artery disease (CAD), and cardiac catheterization can show whether plaque is narrowing or blocking your heart's arteries. This procedure is also used to evaluate the valves, as well as measure the blood flow and oxygen levels in different parts of your heart. For further information please visit [email or web address]. Please follow instruction sheet, as given.    Follow-Up: Your physician recommends that you schedule a follow-up appointment in: 09/30/17 ARRIVE AT 10:45 TO SEE James DUKE, PA-C  Any Other Special Instructions Will Be Listed Below (If Applicable).    [facility] [address] [place] [address] Dept: [phone] Loc: [place]  09/16/2017  You are scheduled for a Cardiac Catheterization on Monday, July 29 with Dr. Daneen Schick.  1. Please arrive at the [facility] (Main Entrance A) at [facility]: [address] [place], [place] [address] at 8:00 AM (This time is two hours before your procedure to ensure your preparation). Free valet parking service is available.   Special note: Every effort is made to have your procedure done on time. Please understand that emergencies sometimes delay scheduled procedures.  2. Diet: Do not eat solid foods after midnight.  The patient may have clear liquids until 5am upon the day of the  procedure.  3. Labs: You will need to have blood drawn on Wednesday, July 24 at [facility]  Open: 8am - 5pm (Lunch 12:30 - 1:30)   Phone: [phone]. You do not need to be fasting.  4. Medication instructions in preparation for your procedure:   Contrast Allergy: No  Stop taking Eliquis (Apixiban) on Saturday, July 27.  Take only 22 units of insulin the night before your procedure. Do not take any insulin on the day of the procedure.  Stop Taking PO Diabetes Meds Glucophage (Metformin)on Monday, July 29. RESTART 09/23/17  On the morning of your procedure, take your Aspirin and any morning medicines NOT listed above.  You may use sips of water.  5. Plan for one night stay--bring personal belongings. 6. Bring a current list of your medications and current insurance cards. 7. You MUST have a responsible person to drive you home. 8. Someone MUST be with you the first 24 hours after you arrive home or your discharge will be delayed. 9. Please wear clothes that are easy to get on and off and wear slip-on shoes.  Thank you for allowing [place] to care for you!   -- [facility]         If you need a refill on your cardiac medications before your next appointment, please call your pharmacy.

## 2017-09-17 ENCOUNTER — Telehealth: Payer: Self-pay | Admitting: *Deleted

## 2017-09-17 ENCOUNTER — Encounter: Payer: Self-pay | Admitting: *Deleted

## 2017-09-17 LAB — BASIC METABOLIC PANEL
BUN/Creatinine Ratio: 12 (ref 10–24)
BUN: 19 mg/dL (ref 8–27)
CO2: 23 mmol/L (ref 20–29)
Calcium: 9 mg/dL (ref 8.6–10.2)
Chloride: 103 mmol/L (ref 96–106)
Creatinine, Ser: 1.61 mg/dL — ABNORMAL HIGH (ref 0.76–1.27)
GFR calc Af Amer: 49 mL/min/{1.73_m2} — ABNORMAL LOW (ref >59)
GFR calc non Af Amer: 43 mL/min/{1.73_m2} — ABNORMAL LOW (ref >59)
Glucose: 103 mg/dL — ABNORMAL HIGH (ref 65–99)
Potassium: 5.3 mmol/L — ABNORMAL HIGH (ref 3.5–5.2)
Sodium: 143 mmol/L (ref 134–144)

## 2017-09-17 LAB — CBC
Hematocrit: 56.8 % — ABNORMAL HIGH (ref 37.5–51.0)
Hemoglobin: 17.6 g/dL (ref 13.0–17.7)
MCH: 23.7 pg — ABNORMAL LOW (ref 26.6–33.0)
MCHC: 31 g/dL — ABNORMAL LOW (ref 31.5–35.7)
MCV: 76 fL — ABNORMAL LOW (ref 79–97)
Platelets: 330 10*3/uL (ref 150–450)
RBC: 7.43 x10E6/uL (ref 4.14–5.80)
RDW: 21.1 % — ABNORMAL HIGH (ref 12.3–15.4)
WBC: 10.2 10*3/uL (ref 3.4–10.8)

## 2017-09-17 MED ORDER — PREDNISONE 50 MG PO TABS: ORAL_TABLET | ORAL | 0 refills | Status: DC

## 2017-09-17 NOTE — Telephone Encounter (Addendum)
Catheterization scheduled at [facility] for: Monday September 21, 2017 10:30 AM Verify arrival time and place: [facility] [place] Entrance A at: 8 AM  No solid food after midnight prior to cath, clear liquids until 5 AM day of procedure.  I discussed contrast allergy with patient, he does not think this is an allergy, he cannot remember details. Notes from CTA chest 08/24/17 state patient was pre-medicated for a known iodine allergy. I also spoke with pt's sister, Olin Hauser [facility]) and she does remember pt taking contrast allergy prep prior to CTA chest 08/24/17. I have instructed patient and pt's sister, Olin Hauser, on 13 hour prednisone and benadryl prep pre-cath, they verbalized understanding.  Hold: Lisinopril-day before and day of procedure. Insulin -1/2 usual insulin Pm prior to procedure Januvia- AM of procedure Metformin -AM of procedure and 48 hours post procedure. Apixaban-09/19/17 until post procedure.  Except hold medications AM meds can be  taken pre-cath with sip of water including: ASA 81 mg Prednisone 50 mg Benadryl 50 mg  Confirm patient has responsible person to drive home post procedure and for 24 hours after you arrive home-yes  I discussed instructions with patient and patient's sister, Olin Hauser, they verbalized understanding, thanked me for call.

## 2017-09-18 ENCOUNTER — Ambulatory Visit: Payer: Medicare HMO | Admitting: *Deleted

## 2017-09-18 DIAGNOSIS — I6389 Other cerebral infarction: Secondary | ICD-10-CM

## 2017-09-18 LAB — CUP PACEART REMOTE DEVICE CHECK
Date Time Interrogation Session: 20190627204033
Implantable Pulse Generator Implant Date: 20160629

## 2017-09-20 NOTE — H&P (Signed)
Cath Lab Visit (complete for each Cath Lab visit)  Clinical Evaluation Leading to the Procedure:   ACS: No.  Non-ACS:    Anginal Classification: CCS III  Anti-ischemic medical therapy: Minimal Therapy (1 class of medications)  Non-Invasive Test Results: No non-invasive testing performed  Prior CABG: No previous CABG       

## 2017-09-21 ENCOUNTER — Encounter (HOSPITAL_COMMUNITY)
Admission: RE | Disposition: A | Discharge status: Home / Self Care | Payer: Self-pay | Source: Ambulatory Visit | Attending: Interventional Cardiology

## 2017-09-21 ENCOUNTER — Other Ambulatory Visit: Payer: Self-pay

## 2017-09-21 ENCOUNTER — Telehealth: Payer: Self-pay | Admitting: Neurology

## 2017-09-21 ENCOUNTER — Ambulatory Visit (HOSPITAL_COMMUNITY)
Admission: RE | Admit: 2017-09-21 | Discharge: 2017-09-22 | Disposition: A | Discharge status: Home / Self Care | Payer: Medicare HMO | Source: Ambulatory Visit | Attending: Interventional Cardiology | Admitting: Interventional Cardiology

## 2017-09-21 ENCOUNTER — Encounter (HOSPITAL_COMMUNITY): Payer: Self-pay | Admitting: *Deleted

## 2017-09-21 DIAGNOSIS — I25118 Atherosclerotic heart disease of native coronary artery with other forms of angina pectoris: Secondary | ICD-10-CM | POA: Diagnosis not present

## 2017-09-21 DIAGNOSIS — E7800 Pure hypercholesterolemia, unspecified: Secondary | ICD-10-CM | POA: Diagnosis not present

## 2017-09-21 DIAGNOSIS — M109 Gout, unspecified: Secondary | ICD-10-CM | POA: Insufficient documentation

## 2017-09-21 DIAGNOSIS — Q211 Atrial septal defect: Secondary | ICD-10-CM | POA: Diagnosis not present

## 2017-09-21 DIAGNOSIS — Z9889 Other specified postprocedural states: Secondary | ICD-10-CM | POA: Diagnosis not present

## 2017-09-21 DIAGNOSIS — Z8673 Personal history of transient ischemic attack (TIA), and cerebral infarction without residual deficits: Secondary | ICD-10-CM | POA: Diagnosis not present

## 2017-09-21 DIAGNOSIS — R0609 Other forms of dyspnea: Secondary | ICD-10-CM | POA: Diagnosis not present

## 2017-09-21 DIAGNOSIS — Z823 Family history of stroke: Secondary | ICD-10-CM | POA: Insufficient documentation

## 2017-09-21 DIAGNOSIS — Z809 Family history of malignant neoplasm, unspecified: Secondary | ICD-10-CM | POA: Insufficient documentation

## 2017-09-21 DIAGNOSIS — Z7982 Long term (current) use of aspirin: Secondary | ICD-10-CM | POA: Diagnosis not present

## 2017-09-21 DIAGNOSIS — Z96652 Presence of left artificial knee joint: Secondary | ICD-10-CM | POA: Insufficient documentation

## 2017-09-21 DIAGNOSIS — Z794 Long term (current) use of insulin: Secondary | ICD-10-CM | POA: Insufficient documentation

## 2017-09-21 DIAGNOSIS — I129 Hypertensive chronic kidney disease with stage 1 through stage 4 chronic kidney disease, or unspecified chronic kidney disease: Secondary | ICD-10-CM | POA: Insufficient documentation

## 2017-09-21 DIAGNOSIS — Z8546 Personal history of malignant neoplasm of prostate: Secondary | ICD-10-CM | POA: Insufficient documentation

## 2017-09-21 DIAGNOSIS — Z79899 Other long term (current) drug therapy: Secondary | ICD-10-CM | POA: Insufficient documentation

## 2017-09-21 DIAGNOSIS — R0600 Dyspnea, unspecified: Secondary | ICD-10-CM | POA: Diagnosis present

## 2017-09-21 DIAGNOSIS — Z7901 Long term (current) use of anticoagulants: Secondary | ICD-10-CM | POA: Insufficient documentation

## 2017-09-21 DIAGNOSIS — N183 Chronic kidney disease, stage 3 unspecified: Secondary | ICD-10-CM | POA: Diagnosis present

## 2017-09-21 DIAGNOSIS — Z87442 Personal history of urinary calculi: Secondary | ICD-10-CM | POA: Insufficient documentation

## 2017-09-21 DIAGNOSIS — D751 Secondary polycythemia: Secondary | ICD-10-CM

## 2017-09-21 DIAGNOSIS — M1990 Unspecified osteoarthritis, unspecified site: Secondary | ICD-10-CM | POA: Insufficient documentation

## 2017-09-21 DIAGNOSIS — I480 Paroxysmal atrial fibrillation: Secondary | ICD-10-CM | POA: Insufficient documentation

## 2017-09-21 DIAGNOSIS — Z23 Encounter for immunization: Secondary | ICD-10-CM | POA: Diagnosis not present

## 2017-09-21 DIAGNOSIS — Z833 Family history of diabetes mellitus: Secondary | ICD-10-CM | POA: Diagnosis not present

## 2017-09-21 DIAGNOSIS — I493 Ventricular premature depolarization: Secondary | ICD-10-CM | POA: Insufficient documentation

## 2017-09-21 DIAGNOSIS — E1122 Type 2 diabetes mellitus with diabetic chronic kidney disease: Secondary | ICD-10-CM | POA: Diagnosis not present

## 2017-09-21 DIAGNOSIS — Z955 Presence of coronary angioplasty implant and graft: Secondary | ICD-10-CM | POA: Diagnosis not present

## 2017-09-21 DIAGNOSIS — I209 Angina pectoris, unspecified: Secondary | ICD-10-CM | POA: Diagnosis present

## 2017-09-21 DIAGNOSIS — I25119 Atherosclerotic heart disease of native coronary artery with unspecified angina pectoris: Secondary | ICD-10-CM | POA: Insufficient documentation

## 2017-09-21 DIAGNOSIS — Z806 Family history of leukemia: Secondary | ICD-10-CM | POA: Insufficient documentation

## 2017-09-21 DIAGNOSIS — C61 Malignant neoplasm of prostate: Secondary | ICD-10-CM

## 2017-09-21 DIAGNOSIS — Z8049 Family history of malignant neoplasm of other genital organs: Secondary | ICD-10-CM | POA: Insufficient documentation

## 2017-09-21 HISTORY — PX: LEFT HEART CATH AND CORONARY ANGIOGRAPHY: CATH118249

## 2017-09-21 HISTORY — DX: Gout, unspecified: M10.9

## 2017-09-21 HISTORY — PX: CORONARY STENT INTERVENTION: CATH118234

## 2017-09-21 HISTORY — DX: Heart failure, unspecified: I50.9

## 2017-09-21 LAB — GLUCOSE, CAPILLARY
Glucose-Capillary: 258 mg/dL — ABNORMAL HIGH (ref 70–99)
Glucose-Capillary: 185 mg/dL — ABNORMAL HIGH (ref 70–99)
Glucose-Capillary: 236 mg/dL — ABNORMAL HIGH (ref 70–99)
Glucose-Capillary: 295 mg/dL — ABNORMAL HIGH (ref 70–99)

## 2017-09-21 LAB — CREATININE, SERUM
Creatinine, Ser: 1.56 mg/dL — ABNORMAL HIGH (ref 0.61–1.24)
GFR calc Af Amer: 50 mL/min — ABNORMAL LOW (ref >60)
GFR calc non Af Amer: 43 mL/min — ABNORMAL LOW (ref >60)

## 2017-09-21 LAB — HEMOGLOBIN A1C
Hgb A1c MFr Bld: 7.3 % — ABNORMAL HIGH (ref 4.8–5.6)
Mean Plasma Glucose: 162.81 mg/dL

## 2017-09-21 LAB — POCT ACTIVATED CLOTTING TIME
Activated Clotting Time: 241 seconds
Activated Clotting Time: 307 seconds

## 2017-09-21 LAB — CBC
HCT: 60.4 % — ABNORMAL HIGH (ref 39.0–52.0)
Hemoglobin: 17 g/dL (ref 13.0–17.0)
MCH: 21.8 pg — ABNORMAL LOW (ref 26.0–34.0)
MCHC: 28.1 g/dL — ABNORMAL LOW (ref 30.0–36.0)
MCV: 77.5 fL — ABNORMAL LOW (ref 78.0–100.0)
Platelets: 420 10*3/uL — ABNORMAL HIGH (ref 150–400)
RBC: 7.79 MIL/uL — ABNORMAL HIGH (ref 4.22–5.81)
RDW: 22.7 % — ABNORMAL HIGH (ref 11.5–15.5)
WBC: 15.5 10*3/uL — ABNORMAL HIGH (ref 4.0–10.5)

## 2017-09-21 SURGERY — LEFT HEART CATH AND CORONARY ANGIOGRAPHY
Anesthesia: LOCAL

## 2017-09-21 MED ORDER — FAMOTIDINE IN NACL 20-0.9 MG/50ML-% IV SOLN
INTRAVENOUS | Status: AC
  Filled 2017-09-21: qty 50

## 2017-09-21 MED ORDER — SODIUM CHLORIDE 0.9% FLUSH: 3.0000 mL | INTRAVENOUS | Status: DC | PRN

## 2017-09-21 MED ORDER — ALLOPURINOL 100 MG PO TABS
200.0000 mg | ORAL_TABLET | Freq: Every day | ORAL | Status: DC
  Administered 2017-09-21 – 2017-09-22 (×2): 200 mg via ORAL
  Filled 2017-09-21 (×2): qty 2

## 2017-09-21 MED ORDER — ONETOUCH VERIO W/DEVICE KIT: 1.0000 | PACK | Freq: Two times a day (BID) | Status: DC

## 2017-09-21 MED ORDER — SODIUM CHLORIDE 0.9 % WEIGHT BASED INFUSION: 1.0000 mL/kg/h | INTRAVENOUS | Status: DC

## 2017-09-21 MED ORDER — INSULIN ASPART 100 UNIT/ML ~~LOC~~ SOLN
0.0000 [IU] | Freq: Three times a day (TID) | SUBCUTANEOUS | Status: DC
  Administered 2017-09-21: 14:00:00 3 [IU] via SUBCUTANEOUS
  Administered 2017-09-21: 18:00:00 5 [IU] via SUBCUTANEOUS
  Administered 2017-09-22: 2 [IU] via SUBCUTANEOUS
  Administered 2017-09-22: 3 [IU] via SUBCUTANEOUS

## 2017-09-21 MED ORDER — NITROGLYCERIN 1 MG/10 ML FOR IR/CATH LAB
INTRA_ARTERIAL | Status: DC | PRN
  Administered 2017-09-21 (×2): 200 ug via INTRACORONARY

## 2017-09-21 MED ORDER — MIDAZOLAM HCL 2 MG/2ML IJ SOLN
INTRAMUSCULAR | Status: AC
  Filled 2017-09-21: qty 2

## 2017-09-21 MED ORDER — HYDRALAZINE HCL 20 MG/ML IJ SOLN
5.0000 mg | INTRAMUSCULAR | Status: AC | PRN
  Administered 2017-09-21: 5 mg via INTRAVENOUS
  Filled 2017-09-21: qty 1

## 2017-09-21 MED ORDER — CLOPIDOGREL BISULFATE 300 MG PO TABS
ORAL_TABLET | ORAL | Status: AC
  Filled 2017-09-21: qty 1

## 2017-09-21 MED ORDER — ASPIRIN 81 MG PO CHEW
81.0000 mg | CHEWABLE_TABLET | Freq: Every day | ORAL | Status: DC
  Administered 2017-09-22: 09:00:00 81 mg via ORAL
  Filled 2017-09-21: qty 1

## 2017-09-21 MED ORDER — APIXABAN 5 MG PO TABS
5.0000 mg | ORAL_TABLET | Freq: Two times a day (BID) | ORAL | Status: DC
  Administered 2017-09-21 – 2017-09-22 (×2): 5 mg via ORAL
  Filled 2017-09-21 (×2): qty 1

## 2017-09-21 MED ORDER — AMLODIPINE BESYLATE 5 MG PO TABS
10.0000 mg | ORAL_TABLET | Freq: Every day | ORAL | Status: DC
  Administered 2017-09-21 – 2017-09-22 (×2): 10 mg via ORAL
  Filled 2017-09-21 (×3): qty 2

## 2017-09-21 MED ORDER — VERAPAMIL HCL 2.5 MG/ML IV SOLN
INTRAVENOUS | Status: AC
  Filled 2017-09-21: qty 2

## 2017-09-21 MED ORDER — NITROGLYCERIN IN D5W 200-5 MCG/ML-% IV SOLN
INTRAVENOUS | Status: DC | PRN
  Administered 2017-09-21: 10 ug/min via INTRAVENOUS

## 2017-09-21 MED ORDER — PNEUMOCOCCAL VAC POLYVALENT 25 MCG/0.5ML IJ INJ
0.5000 mL | INJECTION | INTRAMUSCULAR | Status: AC
  Administered 2017-09-22: 0.5 mL via INTRAMUSCULAR
  Filled 2017-09-21: qty 0.5

## 2017-09-21 MED ORDER — POLYMYXIN B-TRIMETHOPRIM 10000-0.1 UNIT/ML-% OP SOLN
2.0000 [drp] | OPHTHALMIC | Status: DC
Start: 2017-09-21 — End: 2017-09-21

## 2017-09-21 MED ORDER — INSULIN ASPART 100 UNIT/ML ~~LOC~~ SOLN: 0.0000 [IU] | Freq: Three times a day (TID) | SUBCUTANEOUS | Status: DC

## 2017-09-21 MED ORDER — CLOPIDOGREL BISULFATE 300 MG PO TABS
ORAL_TABLET | ORAL | Status: DC | PRN
  Administered 2017-09-21: 600 mg via ORAL

## 2017-09-21 MED ORDER — SODIUM CHLORIDE 0.9% FLUSH: 3.0000 mL | Freq: Two times a day (BID) | INTRAVENOUS | Status: DC

## 2017-09-21 MED ORDER — HEPARIN (PORCINE) IN NACL 1000-0.9 UT/500ML-% IV SOLN
INTRAVENOUS | Status: DC | PRN
  Administered 2017-09-21 (×2): 500 mL

## 2017-09-21 MED ORDER — LIDOCAINE HCL (PF) 1 % IJ SOLN
INTRAMUSCULAR | Status: AC
  Filled 2017-09-21: qty 30

## 2017-09-21 MED ORDER — LABETALOL HCL 5 MG/ML IV SOLN: 10.0000 mg | INTRAVENOUS | Status: AC | PRN

## 2017-09-21 MED ORDER — ATORVASTATIN CALCIUM 80 MG PO TABS
80.0000 mg | ORAL_TABLET | Freq: Every day | ORAL | Status: DC
  Administered 2017-09-21: 18:00:00 80 mg via ORAL
  Filled 2017-09-21: qty 1

## 2017-09-21 MED ORDER — ALBUTEROL SULFATE HFA 108 (90 BASE) MCG/ACT IN AERS: 2.0000 | INHALATION_SPRAY | Freq: Four times a day (QID) | RESPIRATORY_TRACT | Status: DC | PRN

## 2017-09-21 MED ORDER — HEPARIN SODIUM (PORCINE) 1000 UNIT/ML IJ SOLN
INTRAMUSCULAR | Status: DC | PRN
  Administered 2017-09-21: 1500 [IU] via INTRAVENOUS
  Administered 2017-09-21: 5000 [IU] via INTRAVENOUS
  Administered 2017-09-21: 7000 [IU] via INTRAVENOUS
  Administered 2017-09-21: 2000 [IU] via INTRAVENOUS

## 2017-09-21 MED ORDER — CARVEDILOL 12.5 MG PO TABS
12.5000 mg | ORAL_TABLET | Freq: Two times a day (BID) | ORAL | Status: DC
  Administered 2017-09-21 – 2017-09-22 (×3): 12.5 mg via ORAL
  Filled 2017-09-21 (×3): qty 1

## 2017-09-21 MED ORDER — ATORVASTATIN CALCIUM 40 MG PO TABS: 40.0000 mg | ORAL_TABLET | Freq: Every day | ORAL | Status: DC

## 2017-09-21 MED ORDER — CLOPIDOGREL BISULFATE 75 MG PO TABS
75.0000 mg | ORAL_TABLET | Freq: Every day | ORAL | Status: DC
  Administered 2017-09-22: 09:00:00 75 mg via ORAL
  Filled 2017-09-21: qty 1

## 2017-09-21 MED ORDER — IOHEXOL 350 MG/ML SOLN
INTRAVENOUS | Status: DC | PRN
  Administered 2017-09-21: 220 mL via INTRA_ARTERIAL

## 2017-09-21 MED ORDER — INSULIN GLARGINE 100 UNIT/ML ~~LOC~~ SOLN
45.0000 [IU] | Freq: Every day | SUBCUTANEOUS | Status: DC
  Administered 2017-09-21: 21:00:00 45 [IU] via SUBCUTANEOUS
  Filled 2017-09-21 (×2): qty 0.45

## 2017-09-21 MED ORDER — LIDOCAINE HCL (PF) 1 % IJ SOLN
INTRAMUSCULAR | Status: DC | PRN
  Administered 2017-09-21: 2 mL

## 2017-09-21 MED ORDER — LINAGLIPTIN 5 MG PO TABS
5.0000 mg | ORAL_TABLET | Freq: Every day | ORAL | Status: DC
  Administered 2017-09-22: 5 mg via ORAL
  Filled 2017-09-21: qty 1

## 2017-09-21 MED ORDER — SODIUM CHLORIDE 0.9 % WEIGHT BASED INFUSION
1.0000 mL/kg/h | INTRAVENOUS | Status: AC
  Administered 2017-09-21: 14:00:00 1 mL/kg/h via INTRAVENOUS

## 2017-09-21 MED ORDER — LABETALOL HCL 5 MG/ML IV SOLN
INTRAVENOUS | Status: DC | PRN
  Administered 2017-09-21: 10 mg via INTRAVENOUS

## 2017-09-21 MED ORDER — SODIUM CHLORIDE 0.9 % WEIGHT BASED INFUSION
3.0000 mL/kg/h | INTRAVENOUS | Status: DC
  Administered 2017-09-21: 3 mL/kg/h via INTRAVENOUS

## 2017-09-21 MED ORDER — FAMOTIDINE IN NACL 20-0.9 MG/50ML-% IV SOLN
INTRAVENOUS | Status: DC | PRN
  Administered 2017-09-21: 20 mg via INTRAVENOUS

## 2017-09-21 MED ORDER — SODIUM CHLORIDE 0.9 % IV SOLN: 250.0000 mL | INTRAVENOUS | Status: DC | PRN

## 2017-09-21 MED ORDER — LABETALOL HCL 5 MG/ML IV SOLN
INTRAVENOUS | Status: AC
  Filled 2017-09-21: qty 4

## 2017-09-21 MED ORDER — HEPARIN SODIUM (PORCINE) 1000 UNIT/ML IJ SOLN
INTRAMUSCULAR | Status: AC
  Filled 2017-09-21: qty 1

## 2017-09-21 MED ORDER — ONDANSETRON HCL 4 MG/2ML IJ SOLN: 4.0000 mg | Freq: Four times a day (QID) | INTRAMUSCULAR | Status: DC | PRN

## 2017-09-21 MED ORDER — ACETAMINOPHEN 325 MG PO TABS: 650.0000 mg | ORAL_TABLET | ORAL | Status: DC | PRN

## 2017-09-21 MED ORDER — NITROGLYCERIN 1 MG/10 ML FOR IR/CATH LAB
INTRA_ARTERIAL | Status: AC
  Filled 2017-09-21: qty 10

## 2017-09-21 MED ORDER — ANGIOPLASTY BOOK
Freq: Once | Status: AC
  Administered 2017-09-21: 21:00:00 1
  Filled 2017-09-21: qty 1

## 2017-09-21 MED ORDER — ASPIRIN 81 MG PO CHEW: 81.0000 mg | CHEWABLE_TABLET | ORAL | Status: DC

## 2017-09-21 MED ORDER — HEPARIN (PORCINE) IN NACL 1000-0.9 UT/500ML-% IV SOLN
INTRAVENOUS | Status: AC
  Filled 2017-09-21: qty 1000

## 2017-09-21 MED ORDER — MIDAZOLAM HCL 2 MG/2ML IJ SOLN
INTRAMUSCULAR | Status: DC | PRN
  Administered 2017-09-21: 1 mg via INTRAVENOUS

## 2017-09-21 MED ORDER — VERAPAMIL HCL 2.5 MG/ML IV SOLN
INTRAVENOUS | Status: DC | PRN
  Administered 2017-09-21: 10 mL via INTRA_ARTERIAL

## 2017-09-21 MED ORDER — FENTANYL CITRATE (PF) 100 MCG/2ML IJ SOLN
INTRAMUSCULAR | Status: DC | PRN
  Administered 2017-09-21: 50 ug via INTRAVENOUS

## 2017-09-21 MED ORDER — FENTANYL CITRATE (PF) 100 MCG/2ML IJ SOLN
INTRAMUSCULAR | Status: AC
  Filled 2017-09-21: qty 2

## 2017-09-21 MED ORDER — INSULIN ASPART 100 UNIT/ML ~~LOC~~ SOLN
0.0000 [IU] | Freq: Every day | SUBCUTANEOUS | Status: DC
  Administered 2017-09-21: 21:00:00 3 [IU] via SUBCUTANEOUS

## 2017-09-21 SURGICAL SUPPLY — 21 items
BALLN SAPPHIRE 2.5X12 (BALLOONS) ×2
BALLN SAPPHIRE ~~LOC~~ 2.75X12 (BALLOONS) ×1 IMPLANT
BALLN SAPPHIRE ~~LOC~~ 3.0X12 (BALLOONS) ×1 IMPLANT
BALLOON SAPPHIRE 2.5X12 (BALLOONS) IMPLANT
CATH INFINITI 5 FR JL3.5 (CATHETERS) ×1 IMPLANT
CATH INFINITI 5FR JL4 (CATHETERS) ×1 IMPLANT
CATH INFINITI JR4 5F (CATHETERS) ×1 IMPLANT
CATH VISTA GUIDE 6FR XB3.5 (CATHETERS) ×1 IMPLANT
DEVICE RAD COMP TR BAND LRG (VASCULAR PRODUCTS) ×1 IMPLANT
GLIDESHEATH SLEND A-KIT 6F 22G (SHEATH) ×1 IMPLANT
GUIDEWIRE INQWIRE 1.5J.035X260 (WIRE) IMPLANT
INQWIRE 1.5J .035X260CM (WIRE) ×2
KIT ENCORE 26 ADVANTAGE (KITS) ×1 IMPLANT
KIT HEART LEFT (KITS) ×2 IMPLANT
KIT HEMO VALVE WATCHDOG (MISCELLANEOUS) ×1 IMPLANT
PACK CARDIAC CATHETERIZATION (CUSTOM PROCEDURE TRAY) ×2 IMPLANT
SHEATH PROBE COVER 6X72 (BAG) ×1 IMPLANT
STENT SYNERGY DES 2.5X16 (Permanent Stent) ×1 IMPLANT
TRANSDUCER W/STOPCOCK (MISCELLANEOUS) ×2 IMPLANT
TUBING CIL FLEX 10 FLL-RA (TUBING) ×2 IMPLANT
WIRE ASAHI PROWATER 180CM (WIRE) ×1 IMPLANT

## 2017-09-21 NOTE — Interval H&P Note (Signed)
Cath Lab Visit (complete for each Cath Lab visit)  Clinical Evaluation Leading to the Procedure:   ACS: No.  Non-ACS:    Anginal Classification: CCS Mcclain  Anti-ischemic medical therapy: Minimal Therapy (1 class of medications)  Non-Invasive Test Results: No non-invasive testing performed  Prior CABG: No previous CABG      History and Physical Interval Note:  09/21/2017 10:26 AM  James Mcclain  has presented today for surgery, with the diagnosis of ua  The various methods of treatment have been discussed with the patient and family. After consideration of risks, benefits and other options for treatment, the patient has consented to  Procedure(s): LEFT HEART CATH AND CORONARY ANGIOGRAPHY (N/A) as a surgical intervention .  The patient's history has been reviewed, patient examined, no change in status, stable for surgery.  I have reviewed the patient's chart and labs.  Questions were answered to the patient's satisfaction.     James Mcclain

## 2017-09-21 NOTE — Progress Notes (Signed)
TR BAND REMOVAL  LOCATION:    right radial  DEFLATED PER PROTOCOL:    Yes.    TIME BAND OFF / DRESSING APPLIED:    1545    SITE UPON ARRIVAL:    Level 0  SITE AFTER BAND REMOVAL:    Level 0  CIRCULATION SENSATION AND MOVEMENT:    Within Normal Limits   Yes.    COMMENTS:     

## 2017-09-21 NOTE — Progress Notes (Signed)
error 

## 2017-09-21 NOTE — Telephone Encounter (Signed)
We have attempted to call the patient 2 times to schedule sleep study. Patient has been unavailable at the phone numbers we have on file and has not returned our calls. At this point we will send a letter asking pt to please contact the sleep lab to schedule their sleep study. If patient calls back we will schedule them for their sleep study. ° °

## 2017-09-22 ENCOUNTER — Other Ambulatory Visit: Payer: Self-pay | Admitting: Cardiology

## 2017-09-22 ENCOUNTER — Ambulatory Visit (INDEPENDENT_AMBULATORY_CARE_PROVIDER_SITE_OTHER): Payer: Medicare HMO | Admitting: *Deleted

## 2017-09-22 ENCOUNTER — Other Ambulatory Visit: Payer: Self-pay | Admitting: *Deleted

## 2017-09-22 DIAGNOSIS — Z955 Presence of coronary angioplasty implant and graft: Secondary | ICD-10-CM | POA: Diagnosis not present

## 2017-09-22 DIAGNOSIS — Z8673 Personal history of transient ischemic attack (TIA), and cerebral infarction without residual deficits: Secondary | ICD-10-CM | POA: Diagnosis not present

## 2017-09-22 DIAGNOSIS — I129 Hypertensive chronic kidney disease with stage 1 through stage 4 chronic kidney disease, or unspecified chronic kidney disease: Secondary | ICD-10-CM | POA: Diagnosis not present

## 2017-09-22 DIAGNOSIS — N183 Chronic kidney disease, stage 3 (moderate): Secondary | ICD-10-CM | POA: Diagnosis not present

## 2017-09-22 DIAGNOSIS — I25119 Atherosclerotic heart disease of native coronary artery with unspecified angina pectoris: Secondary | ICD-10-CM | POA: Diagnosis not present

## 2017-09-22 DIAGNOSIS — N289 Disorder of kidney and ureter, unspecified: Secondary | ICD-10-CM

## 2017-09-22 DIAGNOSIS — R0609 Other forms of dyspnea: Secondary | ICD-10-CM | POA: Diagnosis not present

## 2017-09-22 DIAGNOSIS — E7800 Pure hypercholesterolemia, unspecified: Secondary | ICD-10-CM | POA: Diagnosis not present

## 2017-09-22 DIAGNOSIS — E1122 Type 2 diabetes mellitus with diabetic chronic kidney disease: Secondary | ICD-10-CM | POA: Diagnosis not present

## 2017-09-22 DIAGNOSIS — I6389 Other cerebral infarction: Secondary | ICD-10-CM

## 2017-09-22 DIAGNOSIS — Z23 Encounter for immunization: Secondary | ICD-10-CM | POA: Diagnosis not present

## 2017-09-22 DIAGNOSIS — I10 Essential (primary) hypertension: Secondary | ICD-10-CM

## 2017-09-22 LAB — BASIC METABOLIC PANEL
Anion gap: 8 (ref 5–15)
BUN: 23 mg/dL (ref 8–23)
CO2: 22 mmol/L (ref 22–32)
Calcium: 8.3 mg/dL — ABNORMAL LOW (ref 8.9–10.3)
Chloride: 109 mmol/L (ref 98–111)
Creatinine, Ser: 1.85 mg/dL — ABNORMAL HIGH (ref 0.61–1.24)
GFR calc Af Amer: 41 mL/min — ABNORMAL LOW (ref >60)
GFR calc non Af Amer: 35 mL/min — ABNORMAL LOW (ref >60)
Glucose, Bld: 206 mg/dL — ABNORMAL HIGH (ref 70–99)
Potassium: 5 mmol/L (ref 3.5–5.1)
Sodium: 139 mmol/L (ref 135–145)

## 2017-09-22 LAB — GLUCOSE, CAPILLARY
Glucose-Capillary: 163 mg/dL — ABNORMAL HIGH (ref 70–99)
Glucose-Capillary: 129 mg/dL — ABNORMAL HIGH (ref 70–99)

## 2017-09-22 LAB — CBC
HCT: 56 % — ABNORMAL HIGH (ref 39.0–52.0)
Hemoglobin: 15.8 g/dL (ref 13.0–17.0)
MCH: 21.8 pg — ABNORMAL LOW (ref 26.0–34.0)
MCHC: 28.2 g/dL — ABNORMAL LOW (ref 30.0–36.0)
MCV: 77.2 fL — ABNORMAL LOW (ref 78.0–100.0)
Platelets: 406 10*3/uL — ABNORMAL HIGH (ref 150–400)
RBC: 7.25 MIL/uL — ABNORMAL HIGH (ref 4.22–5.81)
RDW: 22.5 % — ABNORMAL HIGH (ref 11.5–15.5)
WBC: 19.5 10*3/uL — ABNORMAL HIGH (ref 4.0–10.5)

## 2017-09-22 MED ORDER — ATORVASTATIN CALCIUM 80 MG PO TABS: 80.0000 mg | ORAL_TABLET | Freq: Every day | ORAL | 3 refills | Status: DC

## 2017-09-22 MED ORDER — CARVEDILOL 25 MG PO TABS: 25.0000 mg | ORAL_TABLET | Freq: Two times a day (BID) | ORAL | 3 refills | Status: DC

## 2017-09-22 MED ORDER — ASPIRIN 81 MG PO TABS: 81.0000 mg | ORAL_TABLET | Freq: Every day | ORAL | 0 refills | Status: DC

## 2017-09-22 MED ORDER — CLOPIDOGREL BISULFATE 75 MG PO TABS: 75.0000 mg | ORAL_TABLET | Freq: Every day | ORAL | 2 refills | Status: DC

## 2017-09-22 NOTE — Progress Notes (Signed)
CARDIAC REHAB PHASE I   PRE:  Rate/Rhythm: 89 SR PVCs  BP:  Supine:   Sitting: 159/85  Standing:    SaO2:   MODE:  Ambulation: 600 ft   POST:  Rate/Rhythm: 115 ST PVCs/ PACs  BP:  Supine:   Sitting: 170/86  Standing:    SaO2: 98%RA 0805-0855 Pt walked 600 ft on RA with steady gait and tolerated well. Stated he could not have walked this far before. Education completed with pt who voiced understanding. Stressed importance of plavix with stent. Reviewed NTG use, ex ed, watching carbs and heart healthy food choices, and CRP 2. Discussed CRP 2 and will refer to [place] program.   Graylon Good, RN BSN  09/22/2017 8:51 AM

## 2017-09-22 NOTE — Discharge Summary (Addendum)
Discharge Summary    Patient ID: James Mcclain,  MRN: [id number], DOB/AGE: [birth year] 72 y.o.  Admit date: 09/21/2017 Discharge date: 09/22/2017  Primary Care Provider: [name] Primary Cardiologist: Kirk Ruths, MD  Discharge Diagnoses    Principal Problem:   Exertional dyspnea Active Problems:   Coronary artery disease   CKD (chronic kidney disease) stage 3, GFR 30-59 ml/min (HCC)   New-onset angina (HCC)   Allergies Allergies  Allergen Reactions  . Betadine [Povidone Iodine] Other (See Comments)    Patient doesn't remember  . Contrast Media [Iodinated Diagnostic Agents] Other (See Comments)    Patient doesn't remember  . Fish Allergy Swelling    Diagnostic Studies/Procedures    CORONARY STENT INTERVENTION  LEFT HEART CATH AND CORONARY ANGIOGRAPHY 09/21/17  Conclusion    Codominant coronary anatomy  Left main is without significant obstruction.  The left main terminates in the trifurcation with the circumflex, ramus, and LAD.  LAD contains a segmental proximal to mid vessel stent that is widely patent with perhaps 35% ISR.  Circumflex is a dominant vessel giving origin to 4 obtuse marginal branches.  Irregularity with up to 50% stenosis in the proximal to mid circumflex in the AV groove is noted.  Ramus intermedius is sizable and contains ostial to proximal 50% calcified narrowing followed by a relatively focal 85% stenosis in the proximal segment.  RCA is codominant with the circumflex and contains irregularities up to 50% of the proximal to mid and distal vessel.  Successful PTCA and stent of the proximal segment of the ramus intermedius reducing an 85% stenosis with TIMI grade III flow  to less than 10% with TIMI grade III flow.  A Synergy 2.5 x 16 mm stent was used and postdilated to 3.0 mm in diameter.  RECOMMENDATIONS:   Aspirin, apixaban, and clopidogrel for 2 weeks then drop aspirin.  Clopidogrel and apixaban for total of 6  months.  Risk factor modification as determined by guidelines.  Recommend to resume Apixaban, at currently prescribed dose and frequency, on 09/21/2017 at 10 PM.  Recommend concurrent antiplatelet therapy of Aspirin 66m daily for 2 weeks.and Plavix for 6 months.  Recommend to resume Apixaban, at currently prescribed dose and frequency, on 09/21/2017 at 10 PM.  Recommend concurrent antiplatelet therapy of Aspirin 875mdaily for 2 weeks.and Plavix for 6 months.   Diagnostic Diagram       Post-Intervention Diagram        _____________   History of Present Illness     James Mcclain a 89.o. male with a hx of CAD, HTN, PFO, PVCs, hx of stroke, paroxysmal atrial fibrillation, and DM2 on insulin. Stroke in 2015 treated with TPA. Follow up loop recorder showed paroxysmal Afib and he was placed on anticoagulation. Heart cath 07/2012 with DES to LAD.    Pt recently saw Dr. CrStanford Breedn clinic on 08/12/17. At that time, he complained of dyspnea. Pt underwent myoview stress test and echocardiogram. Myoview was negative for ischemia, possible reduced LVEF. Echo confirms normal LVEF of 50-55%. He also had repeat CTA to evaluate aneurysm, which showed stable ascending aortic aneurysm of 4.5 cm. He will need semi-annual CTA or MRA with possible referral to TCTS in the future.   He continued to struggle with dyspnea on exertion and two episodes of chest pain. He was arranged to have cardiac catheterization.   Hospital Course     Consultants: None  Mr. RiLadniernderwent left heart cath on 09/21/17  that revealed 85% stenosis of the ramus intermedius for which a Synergy 2.5 x 16 mm stent was placed. He also had patent LAD stent with up to 35% ISR, 50% stenosis of the circumflex artery, 50% stenosis of the proximal to mid and distal codominant RCA. Recommendation is for aspirin, apixaban and clopidogrel for 2 weeks, then discontinue the aspirin. He will continue on clopidogrel and apixaban for a total of 6  months, then can consider stopping Plavix. He will need risk factor modification. Atorvastatin has been increased from 40 mg to 80 mg daily.  [name] did well overnight. He says that his breathing was better with his walk this morning. His morning labs include K+ 5.0, SCr 1.85- up from 1.56 precath, Hgb 15.8. Will obtain BMet on Friday 8/2. Telemetry review shows sinus rhythm with PVCs, PACs and short runs of afib, rates 80's-90's. BP is mildly elevated. Will increase carvedilol to 25 mg bid. Follow up in office. Right radial cath site stable.   [name] has been seen by cardiac rehab.   Patient has been seen by Dr. Percival Spanish today and deemed ready for discharge home. All follow up appointments have been scheduled. Discharge medications are listed below.  _____________  Discharge Vitals Blood pressure (!) 159/85, pulse 68, temperature 98 F (36.7 C), temperature source Oral, resp. rate 20, height _0  (1.778 m), weight 245 lb 2.4 oz (111.2 kg), SpO2 98 %.  Filed Weights   09/21/17 0758 09/22/17 0620  Weight: 240 lb (108.9 kg) 245 lb 2.4 oz (111.2 kg)   Physical Exam  Constitutional: He is oriented to person, place, and time. He appears well-developed and well-nourished. No distress.  HENT:  Head: Normocephalic and atraumatic.  Neck: Normal range of motion. Neck supple. No JVD present.  Cardiovascular: Normal rate, regular rhythm, normal heart sounds and intact distal pulses. Exam reveals no gallop and no friction rub.  No murmur heard. Pulmonary/Chest: Effort normal and breath sounds normal. No respiratory distress. He has no wheezes. He has no rales.  Abdominal: Soft. Bowel sounds are normal.  Musculoskeletal: Normal range of motion. He exhibits no edema.  Neurological: He is alert and oriented to person, place, and time.  Skin: Skin is warm and dry.  Psychiatric: He has a normal mood and affect. His behavior is normal. Thought content normal.  Nursing note and vitals  reviewed.   Labs & Radiologic Studies    CBC Recent Labs    09/21/17 1231 09/22/17 0235  WBC 15.5* 19.5*  HGB 17.0 15.8  HCT 60.4* 56.0*  MCV 77.5* 77.2*  PLT 420* 468*   Basic Metabolic Panel Recent Labs    09/21/17 1231 09/22/17 0235  NA  --  139  K  --  5.0  CL  --  109  CO2  --  22  GLUCOSE  --  206*  BUN  --  23  CREATININE 1.56* 1.85*  CALCIUM  --  8.3*   Liver Function Tests No results for input(s): AST, ALT, ALKPHOS, BILITOT, PROT, ALBUMIN in the last 72 hours. No results for input(s): LIPASE, AMYLASE in the last 72 hours. Cardiac Enzymes No results for input(s): CKTOTAL, CKMB, CKMBINDEX, TROPONINI in the last 72 hours. BNP Invalid input(s): POCBNP D-Dimer No results for input(s): DDIMER in the last 72 hours. Hemoglobin A1C Recent Labs    09/21/17 1231  HGBA1C 7.3*   Fasting Lipid Panel No results for input(s): CHOL, HDL, LDLCALC, TRIG, CHOLHDL, LDLDIRECT in the last 72 hours. Thyroid  Function Tests No results for input(s): TSH, T4TOTAL, T3FREE, THYROIDAB in the last 72 hours.  Invalid input(s): FREET3 _____________  Ct Angio Chest Aorta W &/or Wo Contrast  Result Date: 08/25/2017 CLINICAL DATA:  Follow-up thoracic aneurysm EXAM: CT ANGIOGRAPHY CHEST WITH CONTRAST TECHNIQUE: Multidetector CT imaging of the chest was performed using the standard protocol during bolus administration of intravenous contrast. Multiplanar CT image reconstructions and MIPs were obtained to evaluate the vascular anatomy. CONTRAST:  173m ISOVUE-370 IOPAMIDOL (ISOVUE-370) INJECTION 76%. Patient was pre-medicated for a known iodine allergy COMPARISON:  09/26/2015 FINDINGS: Cardiovascular: Atherosclerotic calcifications of the thoracic aorta are noted. Ascending aorta is again prominent measuring 4.5 cm in greatest dimension stable from the prior exam. No evidence of dissection is noted. Normal tapering distally is seen. Coronary calcifications are noted. The pulmonary artery is  within normal limits. Mediastinum/Nodes: Thoracic inlet demonstrates a 1.9 cm nodule within the left lobe of the thyroid stable in appearance from the prior exam in retrospect. No hilar or mediastinal adenopathy is noted. Esophagus is within normal limits with the exception of a distal sliding-type hiatal hernia. Lungs/Pleura: Lungs are well aerated bilaterally. No focal infiltrate or sizable effusion is seen. Upper Abdomen: Visualized upper abdomen is within normal limits. Musculoskeletal: Mild degenerative changes of the thoracic spine are noted. Review of the MIP images confirms the above findings. IMPRESSION: Stable ascending aortic aneurysm at 4.5 cm. Recommend semi-annual imaging followup by CTA or MRA and referral to cardiothoracic surgery if not already obtained. This recommendation follows 2010 ACCF/AHA/AATS/ACR/ASA/SCA/SCAI/SIR/STS/SVM Guidelines for the Diagnosis and Management of Patients With Thoracic Aortic Disease. Circulation. 2010; 121: eF026-V785Stable left thyroid nodule Aortic Atherosclerosis (ICD10-I70.0) and Emphysema (ICD10-J43.9). Electronically Signed   By: MInez CatalinaM.D.   On: 08/25/2017 08:47   Disposition   Pt is being discharged home today in good condition.  Follow-up Plans & Appointments    Follow-up Information    DLedora Bottcher PA Follow up.   Specialties:  Physician Assistant, Cardiology, Radiology Why:  Cardiology hospital follow up on August 7th at 11:00. Please arrive 15 minutes early for check in.  Contact information: [address] [id number] [phone]       [facility] Follow up.   Specialty:  Cardiology Why:  Please go to the office to have labwork in the morning on Friday 09/25/17. You do not need to be fasting.  Contact information: [phone]        Discharge Instructions    Amb Referral to Cardiac Rehabilitation   Complete by:   As directed    Diagnosis:  Coronary Stents   Diet - low sodium heart healthy   Complete by:  As directed    Discharge instructions   Complete by:  As directed    You will continue to take Eliquis as previously. You will be continued on aspirin for 2 weeks then stop the aspirin. You have been started on Plavix (clopidogrel) to be taken for 6 months then follow up with Dr. CStanford Breedto discuss stopping the Plavix.   PLEASE REMEMBER TO BRING ALL OF YOUR MEDICATIONS TO EACH OF YOUR FOLLOW-UP OFFICE VISITS.  PLEASE ATTEND ALL SCHEDULED FOLLOW-UP APPOINTMENTS.   Activity: Increase activity slowly as tolerated. You may shower, but no soaking baths (or swimming) for 1 week. No driving for 24 hours. No lifting over 5 lbs for 1 week. No sexual activity for 1 week.  You May Return to Work: in 1 week (if applicable)  Wound Care: You may wash cath site gently with soap and water. Keep cath site clean and dry. If you notice pain, swelling, bleeding or pus at your cath site, please call [phone].   Increase activity slowly   Complete by:  As directed       Discharge Medications   Allergies as of 09/22/2017      Reactions   Betadine [povidone Iodine] Other (See Comments)   Patient doesn't remember   Contrast Media [iodinated Diagnostic Agents] Other (See Comments)   Patient doesn't remember   Fish Allergy Swelling      Medication List    STOP taking these medications   predniSONE 50 MG tablet Commonly known as:  DELTASONE     TAKE these medications   albuterol 108 (90 Base) MCG/ACT inhaler Commonly known as:  PROVENTIL HFA;VENTOLIN HFA Inhale 2 puffs into the lungs every 6 (six) hours as needed for wheezing or shortness of breath.   allopurinol 100 MG tablet Commonly known as:  ZYLOPRIM TAKE TWO TABLETS BY MOUTH ONCE DAILY   amLODipine 10 MG tablet Commonly known as:  NORVASC Take 1 tablet (10 mg total) by mouth daily.   apixaban 5 MG Tabs tablet Commonly known as:   ELIQUIS Take 1 tablet (5 mg total) by mouth 2 (two) times daily.   aspirin 81 MG tablet Take 1 tablet (81 mg total) by mouth daily. Stop after 2 weeks. What changed:  additional instructions   atorvastatin 80 MG tablet Commonly known as:  LIPITOR Take 1 tablet (80 mg total) by mouth daily at 6 PM. What changed:    medication strength  how much to take  when to take this   carvedilol 25 MG tablet Commonly known as:  COREG Take 1 tablet (25 mg total) by mouth 2 (two) times daily. What changed:    medication strength  how much to take   clopidogrel 75 MG tablet Commonly known as:  PLAVIX Take 1 tablet (75 mg total) by mouth daily with breakfast.   glucose blood test strip Check BS bid DX: E11.9   Insulin Glargine 100 UNIT/ML Solostar Pen Commonly known as:  LANTUS SOLOSTAR Inject 45 Units into the skin daily.   Insulin Syringe-Needle U-100 30G X 5/16" 0.5 ML Misc Commonly known as:  SAFETY INSULIN SYRINGES As needed for insulin   lisinopril 40 MG tablet Commonly known as:  PRINIVIL,ZESTRIL Take 1 tablet (40 mg total) by mouth daily.   metFORMIN 1000 MG tablet Commonly known as:  GLUCOPHAGE Take 1 tablet (1,000 mg total) by mouth 2 (two) times daily with a meal.   ONETOUCH VERIO w/Device Kit 1 Device by Does not apply route 2 (two) times daily. E11.9   sitaGLIPtin 100 MG tablet Commonly known as:  JANUVIA Take 1 tablet (100 mg total) by mouth daily.   trimethoprim-polymyxin b ophthalmic solution Commonly known as:  POLYTRIM Place 2 drops into both eyes every 4 (four) hours.        Acute coronary syndrome (MI, NSTEMI, STEMI, etc) this admission?: No.    Outstanding Labs/Studies   BMet on 8/2 to assess renal function. Will need lipid panel and LFTs in 4-6 weeks for follow up of increased lipitor.   Duration of Discharge Encounter   Greater than 30 minutes including physician time.  Signed, Daune Perch, NP 09/22/2017, 9:36 AM   History and all  data above reviewed.  Patient examined.  I  agree with the findings as above.  No chest pain, SOB or problems overnight.  The patient exam reveals COR:RRR  ,  Lungs: Clear  ,  Abd: Positive bowel sounds, no rebound no guarding, Ext right radial without bleeding or bruising  .  All available labs, radiology testing, previous records reviewed. Agree with documented assessment and plan.  CAD:  Home today post PCI.  I emphasized the need to stop the ASA in two weeks.  HTN:  BP is elevated.  I will increase the Coreg to 25 mg bid.  Increased creat:  Check BMET later this week.  Oral hydration.     Jeneen Rinks Rodderick Holtzer  10:03 AM  09/22/2017

## 2017-09-22 NOTE — Discharge Instructions (Signed)

## 2017-09-23 ENCOUNTER — Emergency Department (HOSPITAL_COMMUNITY)
Admission: EM | Admit: 2017-09-23 | Discharge: 2017-09-23 | Discharge status: Against Medical Advice | Payer: Medicare HMO | Attending: Emergency Medicine | Admitting: Emergency Medicine

## 2017-09-23 ENCOUNTER — Telehealth: Payer: Self-pay

## 2017-09-23 ENCOUNTER — Emergency Department (HOSPITAL_COMMUNITY): Payer: Medicare HMO

## 2017-09-23 ENCOUNTER — Other Ambulatory Visit: Payer: Self-pay

## 2017-09-23 ENCOUNTER — Telehealth (HOSPITAL_COMMUNITY): Payer: Self-pay

## 2017-09-23 ENCOUNTER — Encounter (HOSPITAL_COMMUNITY): Payer: Self-pay | Admitting: Emergency Medicine

## 2017-09-23 DIAGNOSIS — I509 Heart failure, unspecified: Secondary | ICD-10-CM | POA: Insufficient documentation

## 2017-09-23 DIAGNOSIS — I130 Hypertensive heart and chronic kidney disease with heart failure and stage 1 through stage 4 chronic kidney disease, or unspecified chronic kidney disease: Secondary | ICD-10-CM | POA: Insufficient documentation

## 2017-09-23 DIAGNOSIS — D751 Secondary polycythemia: Secondary | ICD-10-CM

## 2017-09-23 DIAGNOSIS — Z79899 Other long term (current) drug therapy: Secondary | ICD-10-CM | POA: Diagnosis not present

## 2017-09-23 DIAGNOSIS — R0609 Other forms of dyspnea: Secondary | ICD-10-CM

## 2017-09-23 DIAGNOSIS — I129 Hypertensive chronic kidney disease with stage 1 through stage 4 chronic kidney disease, or unspecified chronic kidney disease: Secondary | ICD-10-CM | POA: Insufficient documentation

## 2017-09-23 DIAGNOSIS — Z794 Long term (current) use of insulin: Secondary | ICD-10-CM | POA: Diagnosis not present

## 2017-09-23 DIAGNOSIS — Z7982 Long term (current) use of aspirin: Secondary | ICD-10-CM | POA: Diagnosis not present

## 2017-09-23 DIAGNOSIS — E119 Type 2 diabetes mellitus without complications: Secondary | ICD-10-CM | POA: Insufficient documentation

## 2017-09-23 DIAGNOSIS — I2510 Atherosclerotic heart disease of native coronary artery without angina pectoris: Secondary | ICD-10-CM | POA: Diagnosis not present

## 2017-09-23 DIAGNOSIS — Z7901 Long term (current) use of anticoagulants: Secondary | ICD-10-CM | POA: Diagnosis not present

## 2017-09-23 DIAGNOSIS — N183 Chronic kidney disease, stage 3 (moderate): Secondary | ICD-10-CM | POA: Diagnosis not present

## 2017-09-23 DIAGNOSIS — R0600 Dyspnea, unspecified: Secondary | ICD-10-CM

## 2017-09-23 DIAGNOSIS — R0602 Shortness of breath: Secondary | ICD-10-CM | POA: Insufficient documentation

## 2017-09-23 LAB — BASIC METABOLIC PANEL
Anion gap: 10 (ref 5–15)
BUN: 25 mg/dL — ABNORMAL HIGH (ref 8–23)
CO2: 22 mmol/L (ref 22–32)
Calcium: 8.7 mg/dL — ABNORMAL LOW (ref 8.9–10.3)
Chloride: 107 mmol/L (ref 98–111)
Creatinine, Ser: 1.7 mg/dL — ABNORMAL HIGH (ref 0.61–1.24)
GFR calc Af Amer: 45 mL/min — ABNORMAL LOW (ref >60)
GFR calc non Af Amer: 39 mL/min — ABNORMAL LOW (ref >60)
Glucose, Bld: 81 mg/dL (ref 70–99)
Potassium: 4.1 mmol/L (ref 3.5–5.1)
Sodium: 139 mmol/L (ref 135–145)

## 2017-09-23 LAB — CBC
HCT: 60.6 % — ABNORMAL HIGH (ref 39.0–52.0)
Hemoglobin: 16.9 g/dL (ref 13.0–17.0)
MCH: 21.6 pg — ABNORMAL LOW (ref 26.0–34.0)
MCHC: 27.9 g/dL — ABNORMAL LOW (ref 30.0–36.0)
MCV: 77.5 fL — ABNORMAL LOW (ref 78.0–100.0)
Platelets: 404 10*3/uL — ABNORMAL HIGH (ref 150–400)
RBC: 7.82 MIL/uL — ABNORMAL HIGH (ref 4.22–5.81)
RDW: 22.8 % — ABNORMAL HIGH (ref 11.5–15.5)
WBC: 16.3 10*3/uL — ABNORMAL HIGH (ref 4.0–10.5)

## 2017-09-23 LAB — I-STAT TROPONIN, ED: Troponin i, poc: 0.01 ng/mL (ref 0.00–0.08)

## 2017-09-23 NOTE — ED Notes (Signed)
Pt ambulated from lobby to 25 without complaint. Denied SOB while walking.

## 2017-09-23 NOTE — ED Provider Notes (Signed)
3:45 PM Assumed care from Dr. Sherry Ruffing, please see their note for full history, physical and decision making until this point. In brief this is a 72 y.o. year old male who presented to the ED tonight with Shortness of Breath     Recent stent but here with same complaints as ACS. Cards to see.   Cards saw. Recommended repeat troponin and if negative, dc.   Patient eloped prior to second troponin.  Labs, studies and imaging reviewed by myself and considered in medical decision making if ordered. Imaging interpreted by radiology.  Labs Reviewed  BASIC METABOLIC PANEL - Abnormal; Notable for the following components:      Result Value   BUN 25 (*)    Creatinine, Ser 1.70 (*)    Calcium 8.7 (*)    GFR calc non Af Amer 39 (*)    GFR calc Af Amer 45 (*)    All other components within normal limits  CBC - Abnormal; Notable for the following components:   WBC 16.3 (*)    RBC 7.82 (*)    HCT 60.6 (*)    MCV 77.5 (*)    MCH 21.6 (*)    MCHC 27.9 (*)    RDW 22.8 (*)    Platelets 404 (*)    All other components within normal limits  I-STAT TROPONIN, ED    DG Chest 2 View  Final Result      No follow-ups on file.    Merrily Pew, MD 09/24/17 2328

## 2017-09-23 NOTE — ED Notes (Signed)
[employer] Interrogation device charging at bedside. Will interrogate when device is ready. EDP notified.

## 2017-09-23 NOTE — ED Notes (Signed)
ED Provider at bedside. 

## 2017-09-23 NOTE — Telephone Encounter (Signed)
Nurse from ER wanted to know if we was able see pt episode from this morning but the last transmission received was on 09/15/2017. I let the nurse talk with device tech nurse.

## 2017-09-23 NOTE — ED Triage Notes (Signed)
Patient had coronary stent placed yesterday, complains of waking up this morning feel short of breath and fatigued. Patient denies pain and nausea. Is alert, oriented, and in no apparent distress at this time.

## 2017-09-23 NOTE — Telephone Encounter (Signed)
Patients insurance is active and benefits verified through [facility] - $10.00 co-pay, no deductible, out of pocket amount of $3,400/$355.00 has been met, no co-insurance, and no pre-authorization is required. Passport/reference [id number]

## 2017-09-23 NOTE — ED Provider Notes (Signed)
[facility] EMERGENCY DEPARTMENT Provider Note   CSN: [id number] Arrival date & time: 09/23/17  1223     History   Chief Complaint Chief Complaint  Patient presents with  . Shortness of Breath    HPI James Mcclain is a 72 y.o. male.  The history is provided by the patient and medical records. No language interpreter was used.  Shortness of Breath  This is a recurrent problem. The average episode lasts 4 hours. The problem occurs continuously.The current episode started 3 to 5 hours ago. The problem has been resolved. Pertinent negatives include no fever, no headaches, no rhinorrhea, no neck pain, no cough, no sputum production, no wheezing, no chest pain, no syncope, no vomiting, no abdominal pain, no rash, no leg pain and no leg swelling. He has tried nothing for the symptoms. The treatment provided no relief. Associated medical issues include CAD and past MI.    Past Medical History:  Diagnosis Date  . Arthritis    "right hand" (09/21/2017)  . CHF (congestive heart failure) ([place])   . CKD (chronic kidney disease) stage 3, GFR 30-59 ml/min (HCC)   . Coronary artery disease    a. s/p Xience DES to the LAD 07/2012.  . Dilated aortic root ([place])    a. Mildly dilated by echo 09/2013, f/u MRA scheduled for 12/2014.  [name] Elevated PSA   . Essential hypertension   . Gout    "on daily RX" (09/21/2017)  . Heart murmur   . History of kidney stones   . Hyperlipidemia   . Insulin dependent diabetes mellitus ([place])   . 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 On tele 09/2013.  [name] Paroxysmal atrial fibrillation (HCC)   . PFO (patent foramen ovale)    a. By echo 09/2013.  [name] Polycythemia   . Prostate cancer ([place]) 07/15/12   Gleason 7, external beam radiation Tx, Dr. Pilar Jarvis  . PVC's (premature ventricular contractions)   . Stroke [facility])    a. Cryptogenic, 09/2013, event monitor with NSR. Small PFO noted on echo but neg LE duplex.    Patient Active Problem List   Diagnosis  Date Noted  . New-onset angina ([place]) 09/21/2017  . History of snoring 08/05/2017  . Neoplastic malignant related fatigue 08/05/2017  . 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 CKD (chronic kidney disease) stage 3, GFR 30-59 ml/min (HCC)   . Paroxysmal atrial fibrillation ([place]) 07/30/2015  . PFO (patent foramen ovale)   . Dilated aortic root ([place])   . PVC's (premature ventricular contractions)   . Insulin dependent diabetes mellitus ([place])   . Stroke ([place])   . Coronary artery disease   . Multifocal atrial tachycardia (HCC)   . Polycythemia 05/16/2014  . History of CVA (cerebrovascular accident) 01/02/2014  . 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 Essential hypertension 11/28/2013  . Hyperlipidemia 11/28/2013  . Cerebral infarction due to embolism of left middle cerebral artery ([place]) 11/28/2013  . CAD (coronary artery disease) 12/17/2012  . Prostate cancer ([place])   . 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 Exertional dyspnea 06/23/2012  . Murmur 06/23/2012  . DM2 (diabetes mellitus, type 2) ([place])   . Elevated PSA     Past Surgical History:  Procedure Laterality Date  . COLONOSCOPY WITH PROPOFOL N/A 02/22/2016   Procedure: COLONOSCOPY WITH PROPOFOL;  Surgeon: Carol Ada, MD;  Location: [facility] ENDOSCOPY;  Service: Endoscopy;  Laterality: N/A;  . CORONARY  ANGIOPLASTY WITH STENT PLACEMENT  08/05/2012   LAD, 1 stent  . CORONARY STENT INTERVENTION N/A 09/21/2017   Procedure: CORONARY STENT INTERVENTION;  Surgeon: Belva Crome, MD;  Location: [place] CV LAB;  Service: Cardiovascular;  Laterality: N/A;  . EP IMPLANTABLE DEVICE N/A 08/23/2014   Procedure: Loop Recorder Insertion;  Surgeon: Evans Lance, MD;  Location: [place] CV LAB;  Service: Cardiovascular;  Laterality: N/A;  . INSERTION PROSTATE RADIATION SEED    . JOINT REPLACEMENT    . LEFT HEART CATH AND CORONARY ANGIOGRAPHY N/A 09/21/2017   Procedure: LEFT  HEART CATH AND CORONARY ANGIOGRAPHY;  Surgeon: Belva Crome, MD;  Location: [place] CV LAB;  Service: Cardiovascular;  Laterality: N/A;  . LITHOTRIPSY  2006  . PERCUTANEOUS CORONARY STENT INTERVENTION (PCI-S) N/A 08/05/2012   Procedure: PERCUTANEOUS CORONARY STENT INTERVENTION (PCI-S);  Surgeon: Sherren Mocha, MD;  Location: [facility] CATH LAB;  Service: Cardiovascular;  Laterality: N/A;  . PROSTATE BIOPSY  07/15/2012; 09/23/2013  . TEE WITHOUT CARDIOVERSION N/A 09/27/2013   Procedure: TRANSESOPHAGEAL ECHOCARDIOGRAM (TEE);  Surgeon: Larey Dresser, MD;  Location: [place];  Service: Cardiovascular;  Laterality: N/A;  . TONSILLECTOMY  1990s  . TOTAL KNEE ARTHROPLASTY  01/24/2011   Procedure: TOTAL KNEE ARTHROPLASTY;  Surgeon: Alta Corning;  Location: [facility];  Service: Orthopedics;  Laterality: Left;  COMPUTER ASSISTED LEFT  TOTAL KNEE REPLACEMENT. Anesthesia a combination of regional and general.        Home Medications    Prior to Admission medications   Medication Sig Start Date End Date Taking? Authorizing Provider  albuterol (PROVENTIL HFA;VENTOLIN HFA) 108 (90 Base) MCG/ACT inhaler Inhale 2 puffs into the lungs every 6 (six) hours as needed for wheezing or shortness of breath. 08/28/17   Susy Frizzle, MD  allopurinol (ZYLOPRIM) 100 MG tablet TAKE TWO TABLETS BY MOUTH ONCE DAILY 02/26/17   Susy Frizzle, MD  amLODipine (NORVASC) 10 MG tablet Take 1 tablet (10 mg total) by mouth daily. 09/19/16   Susy Frizzle, MD  apixaban (ELIQUIS) 5 MG TABS tablet Take 1 tablet (5 mg total) by mouth 2 (two) times daily. 01/19/17   Susy Frizzle, MD  aspirin 81 MG tablet Take 1 tablet (81 mg total) by mouth daily. Stop after 2 weeks. 09/22/17   Daune Perch, NP  atorvastatin (LIPITOR) 80 MG tablet Take 1 tablet (80 mg total) by mouth daily at 6 PM. 09/22/17   Daune Perch, NP  Blood Glucose Monitoring Suppl (ONETOUCH VERIO) w/Device KIT 1 Device by Does not apply route 2 (two) times  daily. E11.9 08/28/16   Susy Frizzle, MD  carvedilol (COREG) 25 MG tablet Take 1 tablet (25 mg total) by mouth 2 (two) times daily. 09/22/17 09/22/18  Daune Perch, NP  clopidogrel (PLAVIX) 75 MG tablet Take 1 tablet (75 mg total) by mouth daily with breakfast. 09/22/17   Daune Perch, NP  glucose blood test strip Check BS bid DX: E11.9 08/28/16   Susy Frizzle, MD  Insulin Glargine (LANTUS SOLOSTAR) 100 UNIT/ML Solostar Pen Inject 45 Units into the skin daily. 08/18/16   Susy Frizzle, MD  Insulin Syringe-Needle U-100 (SAFETY INSULIN SYRINGES) 30G X 5/16" 0.5 ML MISC As needed for insulin 06/11/17   Susy Frizzle, MD  lisinopril (PRINIVIL,ZESTRIL) 40 MG tablet Take 1 tablet (40 mg total) by mouth daily. 08/10/17   Susy Frizzle, MD  metFORMIN (GLUCOPHAGE) 1000 MG tablet Take 1 tablet (1,000 mg total) by  mouth 2 (two) times daily with a meal. 08/10/17   [name], [name], MD  sitaGLIPtin (JANUVIA) 100 MG tablet Take 1 tablet (100 mg total) by mouth daily. 08/11/17   Susy Frizzle, MD  trimethoprim-polymyxin b (POLYTRIM) ophthalmic solution Place 2 drops into both eyes every 4 (four) hours. 04/21/17   Susy Frizzle, MD    Family History Family History  Problem Relation Age of Onset  . Leukemia Mother   . Cancer Mother   . Heart Problems Father   . Diabetes Sister        ? father  . Heart Problems Brother   . Heart Problems Unknown        family history  . Stroke Unknown   . Stroke Brother   . Hypertension Brother   . Cervical cancer Sister        ????  . Heart attack Neg Hx     Social History Social History   Tobacco Use  . Smoking status: Never Smoker  . Smokeless tobacco: Never Used  Substance Use Topics  . Alcohol use: Not Currently  . Drug use: Never     Allergies   Betadine [povidone iodine]; Contrast media [iodinated diagnostic agents]; and Fish allergy   Review of Systems Review of Systems  Constitutional: Negative for chills, diaphoresis,  fatigue and fever.  HENT: Negative for congestion and rhinorrhea.   Eyes: Negative for visual disturbance.  Respiratory: Positive for shortness of breath. Negative for cough, sputum production, chest tightness and wheezing.   Cardiovascular: Negative for chest pain, palpitations, leg swelling and syncope.  Gastrointestinal: Negative for abdominal pain, diarrhea, nausea and vomiting.  Genitourinary: Negative for dysuria and flank pain.  Musculoskeletal: Negative for back pain, neck pain and neck stiffness.  Skin: Negative for rash and wound.  Neurological: Negative for light-headedness and headaches.  Psychiatric/Behavioral: Negative for agitation.  All other systems reviewed and are negative.    Physical Exam Updated Vital Signs BP 135/80   Pulse 76   Resp (!) 23   SpO2 96%   Physical Exam  Constitutional: He appears well-developed and well-nourished.  Non-toxic appearance. He does not appear ill. No distress.  HENT:  Head: Normocephalic and atraumatic.  Mouth/Throat: Oropharynx is clear and moist. No oropharyngeal exudate.  Eyes: Pupils are equal, round, and reactive to light. Conjunctivae and EOM are normal.  Neck: Normal range of motion. Neck supple.  Cardiovascular: Normal rate and regular rhythm.  No murmur heard. Pulmonary/Chest: Effort normal and breath sounds normal. No respiratory distress. He has no wheezes. He has no rhonchi. He has no rales.  Abdominal: Soft. There is no tenderness.  Musculoskeletal: He exhibits no edema or tenderness.  Neurological: He is alert. No sensory deficit. He exhibits normal muscle tone.  Skin: Skin is warm and dry. Capillary refill takes less than 2 seconds. He is not diaphoretic. No erythema. No pallor.  Psychiatric: He has a normal mood and affect.  Nursing note and vitals reviewed.    ED Treatments / Results  Labs (all labs ordered are listed, but only abnormal results are displayed) Labs Reviewed  BASIC METABOLIC PANEL -  Abnormal; Notable for the following components:      Result Value   BUN 25 (*)    Creatinine, Ser 1.70 (*)    Calcium 8.7 (*)    GFR calc non Af Amer 39 (*)    GFR calc Af Amer 45 (*)    All other components within normal limits  CBC -  Abnormal; Notable for the following components:   WBC 16.3 (*)    RBC 7.82 (*)    HCT 60.6 (*)    MCV 77.5 (*)    MCH 21.6 (*)    MCHC 27.9 (*)    RDW 22.8 (*)    Platelets 404 (*)    All other components within normal limits  TROPONIN I  I-STAT TROPONIN, ED    EKG EKG Interpretation  Date/Time:  Wednesday September 23 2017 12:26:20 EDT Ventricular Rate:  85 PR Interval:  156 QRS Duration: 128 QT Interval:  404 QTC Calculation: 480 R Axis:   -60 Text Interpretation:  Sinus rhythm with frequent Premature ventricular complexes Left axis deviation Right bundle branch block Inferior infarct , age undetermined Abnormal ECG When compared to prior, similar multiple PVC.  No STEMI Confirmed by Antony Blackbird [phone]) on 09/23/2017 1:10:19 PM   Radiology Dg Chest 2 View  Result Date: 09/23/2017 CLINICAL DATA:  Shortness of breath and weakness since last night. The patient underwent coronary artery stent placement yesterday. History of CVA, atrial fibrillation, and CHF. EXAM: CHEST - 2 VIEW COMPARISON:  Chest x-ray of March 15, 2015 FINDINGS: The lungs are well-expanded and clear. There is mild hemidiaphragm flattening which is stable. The heart and pulmonary vascularity are normal. The mediastinum is normal in width. An implantable cardiac rhythm monitor is present and stable. There is faint calcification in the wall of the aortic arch. There is mild multilevel degenerative disc disease of the thoracic spine. IMPRESSION: COPD.  No CHF nor pneumonia. Thoracic aortic atherosclerosis. Electronically Signed   By: David  Martinique M.D.   On: 09/23/2017 13:01    Procedures Procedures (including critical care time)  Medications Ordered in ED Medications - No data  to display   Initial Impression / Assessment and Plan / ED Course  I have reviewed the triage vital signs and the nursing notes.  Pertinent labs & imaging results that were available during my care of the patient were reviewed by me and considered in my medical decision making (see chart for details).     EVERET FLAGG is a 72 y.o. male with a past medical history significant for CAD status post remote PCI and PCI stenting 2 days ago discharged yesterday, hypertension, PFO, paroxysmal atrial fibrillation, and diabetes who presents with recurrent shortness of breath.  Patient reports that he did not have chest pain before his cath and stenting 2 days ago but reports he has shortness of breath.  He reports he had similar shortness of breath today.  He reports that he woke up with the shortness of breath and could not catch his breath.  He reports no chest pain, chest tightness, or palpitations.  He does report lightheadedness which is persistent.  He reports shortness of breath is improved with rest.  He denies any fevers, chills, cough, or congestion.  He reports no diaphoresis.  He reports some nausea but no vomiting.  He denies any other complaints on arrival.  Due to the recurrent shortness of breath he presented for evaluation.  Next  On exam, lungs are clear.  Chest is nontender.  Patient has symmetric radial pulses and has the bandage on his right arm from the cath.  Patient's abdomen is nontender.  Patient has reassuring vitals and is on room air resting comfortably.  Patient had laboratory testing showed initially negative troponin.  BNP similar to prior.  CBC shows leukocytosis but no anemia.  EKG appeared similar to prior with  multiple PVCs.  Chest x-ray shows COPD with no pneumonia or other normalities.  Given his report that his shortness of breath is similar to the shortness breath he had before needing his stent, cardiology will be called for assessment and  recommendations.  Cardiology called.  Anticipate following up on their recommendations.   Final Clinical Impressions(s) / ED Diagnoses   Final diagnoses:  SOB (shortness of breath)    Clinical Impression: 1. SOB (shortness of breath)     Disposition: Awaiting cardiology recommendations for shortness of breath.  This note was prepared with assistance of Systems analyst. Occasional wrong-word or sound-a-like substitutions may have occurred due to the inherent limitations of voice recognition software.      Tegeler, Gwenyth Allegra, MD 09/23/17 [phone]

## 2017-09-23 NOTE — Consult Note (Addendum)
Cardiology Consultation:   Patient ID: WACEY ZIEGER; [id number]; [birth year]   Admit date: 09/23/2017 Date of Consult: 09/23/2017  Primary Care Provider: [name], FNP Primary Cardiologist: Kirk Ruths, MD   Patient Profile:   AMEL GIANINO is a 72 y.o. male with a hx of CAD, hypertension, PFO, PVCs, history of stroke, paroxysmal atrial fibrillation on Eliquis, polycythemia, CKD and diabetes type 2 on insulin. Who is being seen today for the evaluation of shortness of breath at the request of Dr. Sherry Ruffing.  History of Present Illness:   Mr. Petersen was recently evaluated for dyspnea.  He underwent a negative Myoview and echocardiogram showing normal LVEF of 50-55%.  He continued to have shortness of breath and was brought in for cardiac catheterization on 09/21/2017 that revealed 85% stenosis of the ramus intermedius for which a Synergy stent was placed.  He also had patent LAD stent with up to 35% ISR, 50% stenosis of the circumflex artery, 50% stenosis of the proximal to mid and distal codominant RCA.   The patient has a history of paroxysmal atrial fibrillation as well as prior stroke and is on a apixaban for stroke risk reduction.  He was just discharged yesterday, post cath on Plavix, apixaban, and aspirin.  The aspirin is to be stopped after 2 weeks.  The Plavix is to be continued for 6 months.  His atorvastatin was increased from 40 mg to 80 mg daily.  His carvedilol was increased from 12.5 mg to 25 mg twice daily in the setting of mildly elevated blood pressure and heart rate with frequent PVCs and PACs and possible short runs of A. Fib.  Today the patient awoke with shortness of breath. He got up and walked around but continued to have shortness of breath through the morning. He also has mild lightheadedness but denies have had any chest pain/pressure/tightness or palpitations.  He says that he did not take any carvedilol last night or this morning because he has not picked up the  higher dose yet.  He has taken his Eliquis, aspirin and Plavix.  His shortness of breath resolved shortly after arriving at the ED.  He says that he feels fine now and he has walked up and down the halls without any shortness of breath.  He says that he probably would not have come to the emergency department except that his sister insisted upon it.  He says that the shortness of breath is different than the shortness of breath he had prior to receiving the stent.  When he walked in the halls yesterday before discharged and then today in the ED he says that he felt well and was unable to do this prior to the stent without significant shortness of breath.  His serum creatinine was elevated after his cath at 1.85.  Today serum creatinine is slightly improved at 1.70.  Potassium is normal at 4.1.  Troponin is 0.01.  WBCs are 16.3, 19.5 yesterday.  Hemoglobin is 16.9 (she has a history of polycythemia).  Chest x-ray shows COPD, no CHF nor pneumonia.  Past Medical History:  Diagnosis Date  . Arthritis    "right hand" (09/21/2017)  . CHF (congestive heart failure) ([place])   . CKD (chronic kidney disease) stage 3, GFR 30-59 ml/min (HCC)   . Coronary artery disease    a. s/p Xience DES to the LAD 07/2012.  . Dilated aortic root ([place])    a. Mildly dilated by echo 09/2013, f/u MRA scheduled for 12/2014.  [name]  Elevated PSA   . Essential hypertension   . Gout    "on daily RX" (09/21/2017)  . Heart murmur   . History of kidney stones   . Hyperlipidemia   . Insulin dependent diabetes mellitus ([place])   . 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 On tele 09/2013.  [name] Paroxysmal atrial fibrillation (HCC)   . PFO (patent foramen ovale)    a. By echo 09/2013.  [name] Polycythemia   . Prostate cancer ([place]) 07/15/12   Gleason 7, external beam radiation Tx, Dr. Pilar Jarvis  . PVC's (premature ventricular contractions)   . Stroke [facility])    a. Cryptogenic, 09/2013, event monitor with NSR. Small PFO noted on echo but neg LE duplex.     Past Surgical History:  Procedure Laterality Date  . COLONOSCOPY WITH PROPOFOL N/A 02/22/2016   Procedure: COLONOSCOPY WITH PROPOFOL;  Surgeon: Carol Ada, MD;  Location: [facility];  Service: Endoscopy;  Laterality: N/A;  . CORONARY ANGIOPLASTY WITH STENT PLACEMENT  08/05/2012   LAD, 1 stent  . CORONARY STENT INTERVENTION N/A 09/21/2017   Procedure: CORONARY STENT INTERVENTION;  Surgeon: Belva Crome, MD;  Location: [place] CV LAB;  Service: Cardiovascular;  Laterality: N/A;  . EP IMPLANTABLE DEVICE N/A 08/23/2014   Procedure: Loop Recorder Insertion;  Surgeon: Evans Lance, MD;  Location: [place] CV LAB;  Service: Cardiovascular;  Laterality: N/A;  . INSERTION PROSTATE RADIATION SEED    . JOINT REPLACEMENT    . LEFT HEART CATH AND CORONARY ANGIOGRAPHY N/A 09/21/2017   Procedure: LEFT HEART CATH AND CORONARY ANGIOGRAPHY;  Surgeon: Belva Crome, MD;  Location: [place] CV LAB;  Service: Cardiovascular;  Laterality: N/A;  . LITHOTRIPSY  2006  . PERCUTANEOUS CORONARY STENT INTERVENTION (PCI-S) N/A 08/05/2012   Procedure: PERCUTANEOUS CORONARY STENT INTERVENTION (PCI-S);  Surgeon: Sherren Mocha, MD;  [facility] C[facility] LAB;  Service: Cardiovascular;  Laterality: N/A;  . PROSTATE BIOPSY  07/15/2012; 09/23/2013  . TEE WITHOUT CARDIOVERSION N/A 09/27/2013   Procedure: TRANSESOPHAGEAL ECHOCARDIOGRAM (TEE);  Surgeon: Larey Dresser, MD;  Location: [place];  Service: Cardiovascular;  Laterality: N/A;  . TONSILLECTOMY  1990s  . TOTAL KNEE ARTHROPLASTY  01/24/2011   Procedure: TOTAL KNEE ARTHROPLASTY;  Surgeon: Alta Corning;  Location: [place];  Service: Orthopedics;  Laterality: Left;  COMPUTER ASSISTED LEFT  TOTAL KNEE REPLACEMENT. Anesthesia a combination of regional and general.     Home Medications:  Prior to Admission medications   Medication Sig Start Date End Date Taking? Authorizing Provider  albuterol (PROVENTIL HFA;VENTOLIN HFA) 108 (90 Base) MCG/ACT inhaler Inhale  2 puffs into the lungs every 6 (six) hours as needed for wheezing or shortness of breath. 08/28/17  Yes Susy Frizzle, MD  amLODipine (NORVASC) 10 MG tablet Take 1 tablet (10 mg total) by mouth daily. 09/19/16  Yes Susy Frizzle, MD  apixaban (ELIQUIS) 5 MG TABS tablet Take 1 tablet (5 mg total) by mouth 2 (two) times daily. 01/19/17  Yes Susy Frizzle, MD  aspirin 81 MG tablet Take 1 tablet (81 mg total) by mouth daily. Stop after 2 weeks. 09/22/17  Yes Daune Perch, NP  atorvastatin (LIPITOR) 80 MG tablet Take 1 tablet (80 mg total) by mouth daily at 6 PM. 09/22/17  Yes Daune Perch, NP  carvedilol (COREG) 25 MG tablet Take 1 tablet (25 mg total) by mouth 2 (two) times daily. 09/22/17 09/22/18 Yes Daune Perch, NP  clopidogrel (PLAVIX) 75 MG tablet Take 1 tablet (  75 mg total) by mouth daily with breakfast. 09/22/17  Yes Daune Perch, NP  Insulin Glargine (LANTUS SOLOSTAR) 100 UNIT/ML Solostar Pen Inject 45 Units into the skin daily. 08/18/16  Yes Susy Frizzle, MD  lisinopril (PRINIVIL,ZESTRIL) 40 MG tablet Take 1 tablet (40 mg total) by mouth daily. 08/10/17  Yes Susy Frizzle, MD  metFORMIN (GLUCOPHAGE) 1000 MG tablet Take 1 tablet (1,000 mg total) by mouth 2 (two) times daily with a meal. 08/10/17  Yes [name], [name], MD  sitaGLIPtin (JANUVIA) 100 MG tablet Take 1 tablet (100 mg total) by mouth daily. 08/11/17  Yes Susy Frizzle, MD  allopurinol (ZYLOPRIM) 100 MG tablet TAKE TWO TABLETS BY MOUTH ONCE DAILY 02/26/17   Susy Frizzle, MD  Blood Glucose Monitoring Suppl (ONETOUCH VERIO) w/Device KIT 1 Device by Does not apply route 2 (two) times daily. E11.9 08/28/16   Susy Frizzle, MD  glucose blood test strip Check BS bid DX: E11.9 08/28/16   Susy Frizzle, MD  Insulin Syringe-Needle U-100 (SAFETY INSULIN SYRINGES) 30G X 5/16" 0.5 ML MISC As needed for insulin 06/11/17   Susy Frizzle, MD  trimethoprim-polymyxin b (POLYTRIM) ophthalmic solution Place 2 drops  into both eyes every 4 (four) hours. Patient not taking: Reported on 09/23/2017 04/21/17   Susy Frizzle, MD    Inpatient Medications: Scheduled Meds:  Continuous Infusions:  PRN Meds:   Allergies:    Allergies  Allergen Reactions  . Betadine [Povidone Iodine] Other (See Comments)    Patient doesn't remember  . Contrast Media [Iodinated Diagnostic Agents] Other (See Comments)    Patient doesn't remember  . Fish Allergy Swelling    Social History:   Social History   Socioeconomic History  . Marital status: Widowed    Spouse name: Not on file  . Number of children: 3  . Years of education: 12th  . Highest education level: Not on file  Occupational History  . Occupation: retired  Scientific laboratory technician  . Financial resource strain: Not on file  . Food insecurity:    Worry: Not on file    Inability: Not on file  . Transportation needs:    Medical: Not on file    Non-medical: Not on file  Tobacco Use  . Smoking status: Never Smoker  . Smokeless tobacco: Never Used  Substance and Sexual Activity  . Alcohol use: Not Currently  . Drug use: Never  . Sexual activity: Not Currently  Lifestyle  . Physical activity:    Days per week: Not on file    Minutes per session: Not on file  . Stress: Not on file  Relationships  . Social connections:    Talks on phone: Not on file    Gets together: Not on file    Attends religious service: Not on file    Active member of club or organization: Not on file    Attends meetings of clubs or organizations: Not on file    Relationship status: Not on file  . Intimate partner violence:    Fear of current or ex partner: Not on file    Emotionally abused: Not on file    Physically abused: Not on file    Forced sexual activity: Not on file  Other Topics Concern  . Not on file  Social History Narrative   [name]   Patient is right handed   Patient drinks coffee and sodas    Family History:  Family History   Problem Relation Age of Onset  . Leukemia Mother   . Cancer Mother   . Heart Problems Father   . Diabetes Sister        ? father  . Heart Problems Brother   . Heart Problems Unknown        family history  . Stroke Unknown   . Stroke Brother   . Hypertension Brother   . Cervical cancer Sister        ????  . Heart attack Neg Hx      ROS:  Please see the history of present illness.   All other ROS reviewed and negative.     Physical Exam/Data:   Vitals:   09/23/17 1317 09/23/17 1319 09/23/17 1400  BP: 135/89  132/78  Pulse:  80 76  SpO2:  95% 97%   No intake or output data in the 24 hours ending 09/23/17 1445 There were no vitals filed for this visit. There is no height or weight on file to calculate BMI.  General:  Well nourished, well developed, in no acute distress HEENT: normal Lymph: no adenopathy Neck: no JVD Endocrine:  No thryomegaly Vascular: No carotid bruits; FA pulses 2+ bilaterally without bruits  Cardiac:  normal S1, S2; RRR; no murmur  Lungs:  clear to auscultation bilaterally, no wheezing, rhonchi or rales  Abd: soft, nontender, no hepatomegaly  Ext: no edema Musculoskeletal:  No deformities, BUE and BLE strength normal and equal Skin: warm and dry  Neuro:  CNs 2-12 intact, no focal abnormalities noted Psych:  Normal affect   EKG:  The EKG was personally reviewed and demonstrates:  Sinus rhythm with frequent Premature ventricular complexes, LAD, RBBB, Q waves inferiorly  Telemetry:  Telemetry was personally reviewed and demonstrates: Sinus rhythm with occasional PVCs  Relevant CV Studies:  CORONARY STENT INTERVENTION  LEFT HEART CATH AND CORONARY ANGIOGRAPHY 09/21/17  Conclusion    Codominant coronary anatomy  Left main is without significant obstruction. The left main terminates in the trifurcation with the circumflex, ramus, and LAD.  LAD contains a segmental proximal to mid vessel stent that is widely patent with perhaps 35%  ISR.  Circumflex is a dominant vessel giving origin to 4 obtuse marginal branches. Irregularity with up to 50% stenosis in the proximal to mid circumflex in the AV groove is noted.  Ramus intermedius is sizable and contains ostial to proximal 50% calcified narrowing followed by a relatively focal 85% stenosis in the proximal segment.  RCA is codominant with the circumflex and contains irregularities up to 50% of the proximal to mid and distal vessel.  Successful PTCA and stent of the proximal segment of the ramus intermedius reducing an 85% stenosis with TIMI grade III flow to less than 10% with TIMI grade III flow. A Synergy 2.5 x 16 mm stent was used and postdilated to 3.0 mm in diameter.  RECOMMENDATIONS:   Aspirin, apixaban, and clopidogrel for 2 weeks then drop aspirin.  Clopidogrel and apixaban for total of 6 months.  Risk factor modification as determined by guidelines.  Recommend to resume Apixaban, at currently prescribed dose and frequency, on 09/21/2017 at 10 PM. Recommend concurrent antiplatelet therapy of Aspirin '81mg'$  daily for 2 weeks.and Plavix for 6 months.  Recommend to resume Apixaban, at currently prescribed dose and frequency, on 09/21/2017 at 10 PM. Recommend concurrent antiplatelet therapy of Aspirin '81mg'$  daily for 2 weeks.and Plavix for 6 months.   Diagnostic Diagram       Post-Intervention  Diagram         Echocardiogram 08/24/2017 Study Conclusions  - Left ventricle: Patient in rapid afib during exam The cavity size   was mildly dilated. Wall thickness was normal. Systolic function   was normal. The estimated ejection fraction was in the range of   50% to 55%. Left ventricular diastolic function parameters were   normal. - Aortic valve: There was trivial regurgitation. - Mitral valve: Calcified annulus. Mildly thickened leaflets . - Left atrium: The atrium was mildly dilated. - Atrial septum: No defect or patent foramen ovale was  identified.   Laboratory Data:  Chemistry Recent Labs  Lab 09/21/17 1231 09/22/17 0235 09/23/17 1239  NA  --  139 139  K  --  5.0 4.1  CL  --  109 107  CO2  --  22 22  GLUCOSE  --  206* 81  BUN  --  23 25*  CREATININE 1.56* 1.85* 1.70*  CALCIUM  --  8.3* 8.7*  GFRNONAA 43* 35* 39*  GFRAA 50* 41* 45*  ANIONGAP  --  8 10    No results for input(s): PROT, ALBUMIN, AST, ALT, ALKPHOS, BILITOT in the last 168 hours. Hematology Recent Labs  Lab 09/21/17 1231 09/22/17 0235 09/23/17 1239  WBC 15.5* 19.5* 16.3*  RBC 7.79* 7.25* 7.82*  HGB 17.0 15.8 16.9  HCT 60.4* 56.0* 60.6*  MCV 77.5* 77.2* 77.5*  MCH 21.8* 21.8* 21.6*  MCHC 28.1* 28.2* 27.9*  RDW 22.7* 22.5* 22.8*  PLT 420* 406* 404*   Cardiac EnzymesNo results for input(s): TROPONINI in the last 168 hours.  Recent Labs  Lab 09/23/17 1243  TROPIPOC 0.01    BNPNo results for input(s): BNP, PROBNP in the last 168 hours.  DDimer No results for input(s): DDIMER in the last 168 hours.  Radiology/Studies:  Dg Chest 2 View  Result Date: 09/23/2017 CLINICAL DATA:  Shortness of breath and weakness since last night. The patient underwent coronary artery stent placement yesterday. History of CVA, atrial fibrillation, and CHF. EXAM: CHEST - 2 VIEW COMPARISON:  Chest x-ray of March 15, 2015 FINDINGS: The lungs are well-expanded and clear. There is mild hemidiaphragm flattening which is stable. The heart and pulmonary vascularity are normal. The mediastinum is normal in width. An implantable cardiac rhythm monitor is present and stable. There is faint calcification in the wall of the aortic arch. There is mild multilevel degenerative disc disease of the thoracic spine. IMPRESSION: COPD.  No CHF nor pneumonia. Thoracic aortic atherosclerosis. Electronically Signed   By: David  Martinique M.D.   On: 09/23/2017 13:01    Assessment and Plan:   Shortness of breath -Patient has had recent complaints of shortness of breath and has been  worked up extensively including a cardiac catheterization that was done on 09/21/2017 and revealed 85% stenosis of the ramus intermedius for which a Synergy stent was placed.  He also had patent LAD stent with up to 35% ISR, 50% stenosis of the circumflex artery, 50% stenosis of the proximal to mid and distal codominant RCA.  -Echo on 08/24/17 showed LVEF 50-55% with normal diastolic parameters -The patient returned today to the ED with complaints of shortness of breath that started upon awakening this morning and resolved just before noon after arrival to the ED. He has denied any chest pain/pressure/tightness.  He has had mild lightheadedness but no palpitations. -Troponin is negative and EKG is without any new changes. -He has leukocytosis.  WBCs were 15.5 at time of cardiac cath,  up to 19.5 and today 16.3.  Chest x-ray shows COPD, no CHF nor pneumonia. -The patient does not appear volume overloaded. -Currently his breathing is better and he has been able to walk in the halls without any problems. -I have requested a download of his loop recorder to assess heart rhythm/rate from this morning -We will check 1 more troponin and if it is normal and loop download does no show any significant abnormality can discharge home. He has follow up arranged in our office already.   Paroxysmal atrial fibrillation -Recent increase in carvedilol from 12.5 mg to 25 mg twice daily, however pt has not taken the new dose since hospital discharge yesterday  (none last night or this am).  -Patient is on Eliquis for stroke risk reduction with CHA2DS2/VAS Stroke Risk Score of 6 (CAD, HTN, age, DM, stroke (2))   -He denies any palpitations or awareness of afib. EKG and telemetry show SR with occ PVCs.  Will assess cardiac rhythm with loop recorder download   Hypertension -Medications include lisinopril, carvedilol and Norvasc (has not taken carvedilol today) -BP is fairly well controlled today  Hyperlipidemia with LDL  goal <70 -Atorvastatin recently increased from 40 mg to 80 mg in setting of progressive coronary artery disease requiring stenting -LDL was at goal of 55 and 10/2015.  No recent lipid panel in epic  CKD -Baseline serum creatinine 1.3-1.6.  Serum creatinine rose to 1.85 post cath, 1.70 today.  For questions or updates, please contact [place] Please consult [email or web address] for contact info under Cardiology/STEMI.   Signed, Daune Perch, NP  09/23/2017 2:45 PM    Patient seen and examined. Agree with assessment and plan. Mr. Kue is a 72 year old African-American male has a history of paroxysmal atrial fibrillation polycythemia, chronic kidney disease, type 2 diabetes mellitus on insulin, and has remote history of stroke PFO.  Patient recently had experienced increasing episodes of shortness of breath and underwent cardiac catheterization 2 days ago on September 21, 2017.  He was found to have an 85% stenosis in the ramus intermediate vessel which was successfully stented with a synergy DES stent.  He had concomitant CAD with a  patent LAD stent with 35% smooth in-stent restenosis, percent circumflex stenosis and 50% proximal to mid RCA stenosis.  Was placed on Plavix and Eliquis with plans to continue aspirin for 2 weeks prior to discontinuance.  He was discharged yesterday.  This morning, he experienced a sensation of mild shortness of breath.  He had not taken his carvedilol last night and this morning.  He was not certain if he was having some extra beats.  He described his symptoms to his wife strongly encouraged she come back to the emergency room.  Upon arrival to the emergency room he is completely pain-free.  He has not experienced any recurrence of shortness of breath despite walking in the halls.  His ECG has shown sinus rhythm with occasional PACs and PVCs.  Exam blood pressures 132/78.  Pulse is 78 with an occasional ectopic complex.  There is no JVD.  Carotids are equal bilaterally.  He has  slightly decreased breath sounds at his bases.  There is no wheezing or rales.  Was regular there was a faint systolic murmur.  Abdomen soft nontender.  Radial catheterization site was stable.  There was no edema.  ECG is without acute change and shows sinus rhythm at 85 bpm.  There is right bundle branch block.  Old inferior Q waves.  Compared to prior  tracing of 2 days ago heart rate is slower and ectopy is less.  Is only patient feels very well and is breathing out difficulty and not having any chest discomfort.  Bones are negative.  He has polycythemia hematocrit at 60.6.  Present, do not feel mission is necessary.  K to discharge from ER with planned follow-up as were arranged yesterday.  Also would consider hematology follow-up of his polycythemia.   Troy Sine, MD, [facility] 09/23/2017 5:19 PM

## 2017-09-23 NOTE — Progress Notes (Signed)
Carelink Summary Report / Loop Recorder 

## 2017-09-23 NOTE — ED Notes (Signed)
Pt left room and informed RN he was leaving. Pt stated he "had been here since this morning and waited long enough". pt encouraged to stay but pt was determined to leave. No IV in place when pt left unit. EDP notified.

## 2017-09-23 NOTE — Telephone Encounter (Signed)
James Ades, NP calling from ER to check if any episodes from this morning were transmitted via [facility] d/t pt in ER for [facility].  No transmission since 09/15/17. I advised her to use [facility] as that service will provide an interpretation of any events on ILR.

## 2017-09-24 DIAGNOSIS — M79604 Pain in right leg: Secondary | ICD-10-CM | POA: Diagnosis not present

## 2017-09-24 DIAGNOSIS — M79605 Pain in left leg: Secondary | ICD-10-CM | POA: Diagnosis not present

## 2017-09-24 DIAGNOSIS — R0989 Other specified symptoms and signs involving the circulatory and respiratory systems: Secondary | ICD-10-CM | POA: Diagnosis not present

## 2017-09-29 NOTE — Progress Notes (Signed)
Cardiology Office Note:    Date:  09/30/2017   ID:  James Mcclain, DOB [birth year], MRN [id number]  PCP:  [name], FNP  Cardiologist:  Kirk Ruths, MD   Referring MD: [name], FNP   Chief Complaint  Patient presents with  . Follow-up    from cath, denies chest pains, SOB, and swelling    History of Present Illness:    James Mcclain is a 72 y.o. male with a hx of stroke, PVCs, paroxysmal atrial fibrillation, multifocal atrial tachycardia, hypertension, CAD, prediabetes, CKD stage III, and hyperlipidemia.  He was recently seen and evaluated by cardiology.  He underwent heart catheterization on 09/21/2017 following an abnormal Myoview stress test that revealed 85% stenosis of the ramus intermedius treated with a DES, patent LAD stent with up to 35% in-stent restenosis, 50% stenosis of the circumflex, 50% stenosis of the proximal to mid and distal codominant RCA.  Echo on 08/24/2017 showed LVEF of 50 to 55% and normal diastolic parameters.  Unfortunately, the patient returned to the ER on 09/23/2017 with complaints of shortness of breath.  Cardiology was consulted and recommended a delta troponin to rule out an ACS process.  The patient left AMA prior to second troponin.  Of note, he is generally unaware of his proximal atrial fibrillation.  He has a chads vas score of 6 (CAD, hypertension, age, diabetes, stroke).  He has an implantable loop recorder in place.  He returns today for follow-up. He is doing quite well and has had no recurrent chest pain aside from his ER visit. No bleeding problems.  He denies shortness of breath, lower extremity swelling, palpitations, and syncope.  He is eager to return to his golf game.  I suggested that he walk instead of ride.  He agreed.   Past Medical History:  Diagnosis Date  . Arthritis    "right hand" (09/21/2017)  . CHF (congestive heart failure) ([place])   . CKD (chronic kidney disease) stage 3, GFR 30-59 ml/min (HCC)   . Coronary artery  disease    a. s/p Xience DES to the LAD 07/2012. b. 09/21/17 Synergy stent to RI.   . Dilated aortic root ([place])    a. Mildly dilated by echo 09/2013, f/u MRA scheduled for 12/2014.  [name] Elevated PSA   . Essential hypertension   . Gout    "on daily RX" (09/21/2017)  . Heart murmur   . History of kidney stones   . Hyperlipidemia   . Insulin dependent diabetes mellitus ([place])   . 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 On tele 09/2013.  [name] Paroxysmal atrial fibrillation (HCC)   . PFO (patent foramen ovale)    a. By echo 09/2013.  [name] Polycythemia   . Prostate cancer ([place]) 07/15/12   Gleason 7, external beam radiation Tx, Dr. Pilar Jarvis  . PVC's (premature ventricular contractions)   . Stroke [facility])    a. Cryptogenic, 09/2013, event monitor with NSR. Small PFO noted on echo but neg LE duplex.    Past Surgical History:  Procedure Laterality Date  . COLONOSCOPY WITH PROPOFOL N/A 02/22/2016   Procedure: COLONOSCOPY WITH PROPOFOL;  Surgeon: Carol Ada, MD;  Location: [facility];  Service: Endoscopy;  Laterality: N/A;  . CORONARY ANGIOPLASTY WITH STENT PLACEMENT  08/05/2012   LAD, 1 stent  . CORONARY STENT INTERVENTION N/A 09/21/2017   Procedure: CORONARY STENT INTERVENTION;  Surgeon: Belva Crome, MD;  Location: [place] CV LAB;  Service: Cardiovascular;  Laterality: N/A;  .  EP IMPLANTABLE DEVICE N/A 08/23/2014   Procedure: Loop Recorder Insertion;  Surgeon: Evans Lance, MD;  Location: [place] CV LAB;  Service: Cardiovascular;  Laterality: N/A;  . INSERTION PROSTATE RADIATION SEED    . JOINT REPLACEMENT    . LEFT HEART CATH AND CORONARY ANGIOGRAPHY N/A 09/21/2017   Procedure: LEFT HEART CATH AND CORONARY ANGIOGRAPHY;  Surgeon: Belva Crome, MD;  Location: [place] CV LAB;  Service: Cardiovascular;  Laterality: N/A;  . LITHOTRIPSY  2006  . PERCUTANEOUS CORONARY STENT INTERVENTION (PCI-S) N/A 08/05/2012   Procedure: PERCUTANEOUS CORONARY STENT INTERVENTION (PCI-S);  Surgeon: Sherren Mocha, MD;  Location: [facility] CATH LAB;  Service: Cardiovascular;  Laterality: N/A;  . PROSTATE BIOPSY  07/15/2012; 09/23/2013  . TEE WITHOUT CARDIOVERSION N/A 09/27/2013   Procedure: TRANSESOPHAGEAL ECHOCARDIOGRAM (TEE);  Surgeon: Larey Dresser, MD;  Location: [place];  Service: Cardiovascular;  Laterality: N/A;  . TONSILLECTOMY  1990s  . TOTAL KNEE ARTHROPLASTY  01/24/2011   Procedure: TOTAL KNEE ARTHROPLASTY;  Surgeon: Alta Corning;  Location: [place];  Service: Orthopedics;  Laterality: Left;  COMPUTER ASSISTED LEFT  TOTAL KNEE REPLACEMENT. Anesthesia a combination of regional and general.    Current Medications: Current Meds  Medication Sig  . albuterol (PROVENTIL HFA;VENTOLIN HFA) 108 (90 Base) MCG/ACT inhaler Inhale 2 puffs into the lungs every 6 (six) hours as needed for wheezing or shortness of breath.  . allopurinol (ZYLOPRIM) 100 MG tablet TAKE TWO TABLETS BY MOUTH ONCE DAILY  . amLODipine (NORVASC) 10 MG tablet Take 1 tablet (10 mg total) by mouth daily.  [name] apixaban (ELIQUIS) 5 MG TABS tablet Take 1 tablet (5 mg total) by mouth 2 (two) times daily.  [name] aspirin 81 MG tablet Take 1 tablet (81 mg total) by mouth daily. Stop after 2 weeks.  [name] atorvastatin (LIPITOR) 80 MG tablet Take 1 tablet (80 mg total) by mouth daily at 6 PM.  . Blood Glucose Monitoring Suppl (ONETOUCH VERIO) w/Device KIT 1 Device by Does not apply route 2 (two) times daily. E11.9  . carvedilol (COREG) 25 MG tablet Take 1 tablet (25 mg total) by mouth 2 (two) times daily.  . clopidogrel (PLAVIX) 75 MG tablet Take 1 tablet (75 mg total) by mouth daily with breakfast.  . Dapagliflozin Propanediol (FARXIGA PO) Take 5 mg by mouth.  [name] glucose blood test strip Check BS bid DX: E11.9  . Insulin Glargine (LANTUS SOLOSTAR) 100 UNIT/ML Solostar Pen Inject 45 Units into the skin daily.  . Insulin Syringe-Needle U-100 (SAFETY INSULIN SYRINGES) 30G X 5/16" 0.5 ML MISC As needed for insulin  . lisinopril (PRINIVIL,ZESTRIL) 40 MG  tablet Take 1 tablet (40 mg total) by mouth daily.  . sitaGLIPtin (JANUVIA) 100 MG tablet Take 1 tablet (100 mg total) by mouth daily.  [name] trimethoprim-polymyxin b (POLYTRIM) ophthalmic solution Place 2 drops into both eyes every 4 (four) hours.  . [DISCONTINUED] metFORMIN (GLUCOPHAGE) 1000 MG tablet Take 1 tablet (1,000 mg total) by mouth 2 (two) times daily with a meal.     Allergies:   Betadine [povidone iodine]; Contrast media [iodinated diagnostic agents]; and Fish allergy   Social History   Socioeconomic History  . Marital status: Widowed    Spouse name: Not on file  . Number of children: 3  . Years of education: 12th  . Highest education level: Not on file  Occupational History  . Occupation: retired  Scientific laboratory technician  . Financial resource strain: Not on file  . Food insecurity:  Worry: Not on file    Inability: Not on file  . Transportation needs:    Medical: Not on file    Non-medical: Not on file  Tobacco Use  . Smoking status: Never Smoker  . Smokeless tobacco: Never Used  Substance and Sexual Activity  . Alcohol use: Not Currently  . Drug use: Never  . Sexual activity: Not Currently  Lifestyle  . Physical activity:    Days per week: Not on file    Minutes per session: Not on file  . Stress: Not on file  Relationships  . Social connections:    Talks on phone: Not on file    Gets together: Not on file    Attends religious service: Not on file    Active member of club or organization: Not on file    Attends meetings of clubs or organizations: Not on file    Relationship status: Not on file  Other Topics Concern  . Not on file  Social History Narrative   [name]   Patient is right handed   Patient drinks coffee and sodas     Family History: The patient's family history includes Cancer in his mother; Cervical cancer in his sister; Diabetes in his sister; Heart Problems in his brother, father, and unknown relative; Hypertension in  his brother; Leukemia in his mother; Stroke in his brother and unknown relative. There is no history of Heart attack.  ROS:   Please see the history of present illness.    All other systems reviewed and are negative.  EKGs/Labs/Other Studies Reviewed:    The following studies were reviewed today:  Left heart cath  09/21/17:  Codominant coronary anatomy  Left main is without significant obstruction.  The left main terminates in the trifurcation with the circumflex, ramus, and LAD.  LAD contains a segmental proximal to mid vessel stent that is widely patent with perhaps 35% ISR.  Circumflex is a dominant vessel giving origin to 4 obtuse marginal branches.  Irregularity with up to 50% stenosis in the proximal to mid circumflex in the AV groove is noted.  Ramus intermedius is sizable and contains ostial to proximal 50% calcified narrowing followed by a relatively focal 85% stenosis in the proximal segment.  RCA is codominant with the circumflex and contains irregularities up to 50% of the proximal to mid and distal vessel.  Successful PTCA and stent of the proximal segment of the ramus intermedius reducing an 85% stenosis with TIMI grade III flow  to less than 10% with TIMI grade III flow.  A Synergy 2.5 x 16 mm stent was used and postdilated to 3.0 mm in diameter.  RECOMMENDATIONS:   Aspirin, apixaban, and clopidogrel for 2 weeks then drop aspirin.  Clopidogrel and apixaban for total of 6 months.  Risk factor modification as determined by guidelines.  Recommend to resume Apixaban, at currently prescribed dose and frequency, on 09/21/2017 at 10 PM.  Recommend concurrent antiplatelet therapy of Aspirin 78m daily for 2 weeks.and Plavix for 6 months.   Echo 08/24/17: Study Conclusions - Left ventricle: Patient in rapid afib during exam The cavity size   was mildly dilated. Wall thickness was normal. Systolic function   was normal. The estimated ejection fraction was in the range  of   50% to 55%. Left ventricular diastolic function parameters were   normal. - Aortic valve: There was trivial regurgitation. - Mitral valve: Calcified annulus. Mildly thickened leaflets . - Left atrium: The  atrium was mildly dilated. - Atrial septum: No defect or patent foramen ovale was identified.   EKG:  EKG is ordered today.  The ekg ordered today demonstrates sinus with RBBB (not new)  Recent Labs: 06/15/2017: ALT 9 09/23/2017: BUN 25; Creatinine, Ser 1.70; Hemoglobin 16.9; Platelets 404; Potassium 4.1; Sodium 139  Recent Lipid Panel    Component Value Date/Time   CHOL 103 (L) 11/20/2015 0818   TRIG 82 11/20/2015 0818   HDL 32 (L) 11/20/2015 0818   CHOLHDL 3.2 11/20/2015 0818   VLDL 16 11/20/2015 0818   LDLCALC 55 11/20/2015 0818    Physical Exam:    VS:  BP 120/78 (BP Location: Right Arm, Patient Position: Sitting)   Pulse 67   Ht _0  (1.778 m)   Wt 243 lb 9.6 oz (110.5 kg)   BMI 34.95 kg/m     Wt Readings from Last 3 Encounters:  09/30/17 243 lb 9.6 oz (110.5 kg)  09/22/17 245 lb 2.4 oz (111.2 kg)  09/16/17 243 lb 9.6 oz (110.5 kg)     GEN: Well nourished, well developed in no acute distress HEENT: Normal NECK: No JVD; No carotid bruits LYMPHATICS: No lymphadenopathy CARDIAC: RRR, no murmurs, rubs, gallops RESPIRATORY:  Clear to auscultation without rales, wheezing or rhonchi  ABDOMEN: Soft, non-tender, non-distended MUSCULOSKELETAL:  No edema; No deformity, right radial access site C/D/I SKIN: Warm and dry NEUROLOGIC:  Alert and oriented x 3 PSYCHIATRIC:  Normal affect   ASSESSMENT:    1. Coronary artery disease involving native coronary artery of native heart without angina pectoris   2. Essential hypertension   3. Pure hypercholesterolemia   4. PAF (paroxysmal atrial fibrillation) (HCC)    PLAN:    In order of problems listed above:  Coronary artery disease involving native coronary artery of native heart without angina pectoris  status  post DES to ramus intermedius On triple therapy with aspirin, Plavix, and Eliquis.  He will stop aspirin as instructed 2 weeks following his PCI.  His last dose of aspirin is 10/05/17.  He is having no bleeding problems.  He reports no chest pain, shortness of breath, lower extremity swelling, palpitations, syncope.  He is cleared for cardiac rehab.  No medication changes.  We will collect a CBC and BMP today.  He is on a good regimen.  No medication changes.  Essential hypertension  Pressure is well controlled today.  No medication changes.  Pure hypercholesterolemia - Plan: CBC, Basic metabolic panel, Lipid panel, Hepatic function panel Continue Lipitor.  He will come back for fasting lipids and LFTs approximately 6 weeks following his PCI.  PAF (paroxysmal atrial fibrillation)  EKG with sinus rhythm and right bundle branch block that is not new.  He denies palpitations. This patients CHA2DS2-VASc Score and unadjusted Ischemic Stroke Rate (% per year) is equal to 4.8 % stroke rate/year from a score of 4 (age, HTN, DM, MI)   He is cleared for cardiac rehab.  Follow-up in 3 months with Dr. control.   Medication Adjustments/Labs and Tests Ordered: Current medicines are reviewed at length with the patient today.  Concerns regarding medicines are outlined above.  Orders Placed This Encounter  Procedures  . CBC  . Basic metabolic panel  . Lipid panel  . Hepatic function panel   No orders of the defined types were placed in this encounter.   Signed, Tami Lin Duke, PA  09/30/2017 11:22 AM    [place]

## 2017-09-30 ENCOUNTER — Ambulatory Visit: Payer: Medicare HMO | Admitting: Physician Assistant

## 2017-09-30 ENCOUNTER — Other Ambulatory Visit: Payer: Self-pay | Admitting: Physician Assistant

## 2017-09-30 ENCOUNTER — Encounter: Payer: Self-pay | Admitting: Physician Assistant

## 2017-09-30 VITALS — BP 120/78 | HR 67 | Ht 70.0 in | Wt 243.6 lb

## 2017-09-30 DIAGNOSIS — I209 Angina pectoris, unspecified: Secondary | ICD-10-CM

## 2017-09-30 DIAGNOSIS — I493 Ventricular premature depolarization: Secondary | ICD-10-CM | POA: Diagnosis not present

## 2017-09-30 DIAGNOSIS — D751 Secondary polycythemia: Secondary | ICD-10-CM | POA: Diagnosis not present

## 2017-09-30 DIAGNOSIS — E7800 Pure hypercholesterolemia, unspecified: Secondary | ICD-10-CM

## 2017-09-30 DIAGNOSIS — I480 Paroxysmal atrial fibrillation: Secondary | ICD-10-CM | POA: Diagnosis not present

## 2017-09-30 DIAGNOSIS — I2510 Atherosclerotic heart disease of native coronary artery without angina pectoris: Secondary | ICD-10-CM

## 2017-09-30 DIAGNOSIS — I10 Essential (primary) hypertension: Secondary | ICD-10-CM | POA: Diagnosis not present

## 2017-09-30 DIAGNOSIS — N289 Disorder of kidney and ureter, unspecified: Secondary | ICD-10-CM

## 2017-09-30 NOTE — Patient Instructions (Signed)
Medication Instructions:  LAST DOSE OF Aspirin: 10/05/17  Labwork: Today (CBC, BMET)  Please return for FASTING labs week of September 9th (Lipid, Hepatic)  Our in office lab hours are Monday-Friday 8:00-4:00, closed for lunch 12:45-1:45 pm.  No appointment needed.  Follow-Up: 3 months with Dr. Stanford Breed  Any Other Special Instructions Will Be Listed Below (If Applicable).     If you need a refill on your cardiac medications before your next appointment, please call your pharmacy.

## 2017-10-01 ENCOUNTER — Telehealth (HOSPITAL_COMMUNITY): Payer: Self-pay

## 2017-10-01 ENCOUNTER — Telehealth: Payer: Self-pay

## 2017-10-01 ENCOUNTER — Other Ambulatory Visit: Payer: Self-pay

## 2017-10-01 DIAGNOSIS — N289 Disorder of kidney and ureter, unspecified: Secondary | ICD-10-CM

## 2017-10-01 LAB — CBC
Hematocrit: 53.7 % — ABNORMAL HIGH (ref 37.5–51.0)
Hemoglobin: 16.6 g/dL (ref 13.0–17.7)
MCH: 22.8 pg — ABNORMAL LOW (ref 26.6–33.0)
MCHC: 30.9 g/dL — ABNORMAL LOW (ref 31.5–35.7)
MCV: 74 fL — ABNORMAL LOW (ref 79–97)
Platelets: 356 10*3/uL (ref 150–450)
RBC: 7.28 x10E6/uL (ref 4.14–5.80)
RDW: 21.1 % — ABNORMAL HIGH (ref 12.3–15.4)
WBC: 9.9 10*3/uL (ref 3.4–10.8)

## 2017-10-01 LAB — BASIC METABOLIC PANEL
BUN/Creatinine Ratio: 12 (ref 10–24)
BUN/Creatinine Ratio: 12 (ref 10–24)
BUN: 22 mg/dL (ref 8–27)
BUN: 22 mg/dL (ref 8–27)
CO2: 25 mmol/L (ref 20–29)
CO2: 24 mmol/L (ref 20–29)
Calcium: 9.3 mg/dL (ref 8.6–10.2)
Calcium: 8.9 mg/dL (ref 8.6–10.2)
Chloride: 102 mmol/L (ref 96–106)
Chloride: 102 mmol/L (ref 96–106)
Creatinine, Ser: 1.8 mg/dL — ABNORMAL HIGH (ref 0.76–1.27)
Creatinine, Ser: 1.86 mg/dL — ABNORMAL HIGH (ref 0.76–1.27)
GFR calc Af Amer: 43 mL/min/{1.73_m2} — ABNORMAL LOW (ref >59)
GFR calc Af Amer: 41 mL/min/{1.73_m2} — ABNORMAL LOW (ref >59)
GFR calc non Af Amer: 37 mL/min/{1.73_m2} — ABNORMAL LOW (ref >59)
GFR calc non Af Amer: 36 mL/min/{1.73_m2} — ABNORMAL LOW (ref >59)
Glucose: 132 mg/dL — ABNORMAL HIGH (ref 65–99)
Glucose: 148 mg/dL — ABNORMAL HIGH (ref 65–99)
Potassium: 5.9 mmol/L (ref 3.5–5.2)
Potassium: 5.3 mmol/L — ABNORMAL HIGH (ref 3.5–5.2)
Sodium: 142 mmol/L (ref 134–144)
Sodium: 141 mmol/L (ref 134–144)

## 2017-10-01 NOTE — Telephone Encounter (Signed)
Called patient to see if he was interested in participating in the Cardiac Rehab Program. Patient stated yes. Patient will come in for orientation on 11/12/17 @ 8:15AM and will attend the 9:45AM exercise class.  Mailed homework package.  Went over insurance, patient verbalized understanding.

## 2017-10-01 NOTE — Telephone Encounter (Signed)
Per Angie patient needs to take half tab (20mg ) of 40mg  prescribed Lisinopril and she will contact him tomorrow when she gets to the hospital. Advised patient to record bp. Left message for patient to contact office.

## 2017-10-01 NOTE — Telephone Encounter (Signed)
Patient walked in requesting to speak with Maryfrances Bunnell. Walk in form brought to triage. Patient states that he received a call yesterday evening from Levada Dy stating that his Potassium was elevated 5.9, he should hold his lisinopril and return to the office in the morning to repeat lab work.  Spoke with patient advised him that Levada Dy was not in the office today. Levada Dy did place the order for a repeat bmet. Patient states that he is still asymptomatic and has not taken his Lisinopril. I walked the patient to the lab and assisted him in signing in. Advise patient to continue to HOLD lisinopril until further notice. Advise patient to report to the ED if symptoms develop.Advise patient that we will call him with his lab results and further instruction, I will forward and update to Levada Dy to let her know the patient did report this morning for repeat lab work.

## 2017-10-01 NOTE — Telephone Encounter (Signed)
Stay off lisinopril and follow BP Kirk Ruths

## 2017-10-01 NOTE — Telephone Encounter (Signed)
Called patient to see if he was interested in participating in the Cardiac Rehab Program. Patient stated yes. Patient will come in for orientation on 11/12/17 @ 1:30PM and will attend the 9:45AM exercise class.  Mailed homework package.  Went over insurance, patient verbalized understanding.

## 2017-10-01 NOTE — Telephone Encounter (Signed)
Patient's repeat Bmet result printed from [employer]. His Potassium is now 5.3 Discussed with our office DOD Dr.Hochrein his recommendation is to continue to HOLD Lisinopril and forward an update to [employer] [facility] for further instruction.  Spoke with patient. He is still asymptomatic. Advised him that his Potassium has improved with holding lisinopril. Pt aware of Dr.Hochrein's recommendation to continue to hold lisinopril and defer to Dr.Crenschaw for further instruction. Advised patient that we will call back with Dr.Crenshaw's response.   Patient agreeable with plan and verbalized understanding.  Message forwarded to [facility] and Maryfrances Bunnell

## 2017-10-02 ENCOUNTER — Other Ambulatory Visit: Payer: Self-pay | Admitting: Family Medicine

## 2017-10-02 NOTE — Telephone Encounter (Signed)
Spoke with pt, Aware of dr crenshaw's recommendations.  °

## 2017-10-05 ENCOUNTER — Other Ambulatory Visit: Payer: Self-pay | Admitting: Family Medicine

## 2017-10-06 ENCOUNTER — Other Ambulatory Visit (INDEPENDENT_AMBULATORY_CARE_PROVIDER_SITE_OTHER): Payer: Medicare HMO

## 2017-10-06 DIAGNOSIS — I493 Ventricular premature depolarization: Secondary | ICD-10-CM

## 2017-10-06 DIAGNOSIS — I209 Angina pectoris, unspecified: Secondary | ICD-10-CM

## 2017-10-06 DIAGNOSIS — R0602 Shortness of breath: Secondary | ICD-10-CM | POA: Diagnosis not present

## 2017-10-06 DIAGNOSIS — Z955 Presence of coronary angioplasty implant and graft: Secondary | ICD-10-CM | POA: Diagnosis not present

## 2017-10-06 DIAGNOSIS — I2510 Atherosclerotic heart disease of native coronary artery without angina pectoris: Secondary | ICD-10-CM | POA: Diagnosis not present

## 2017-10-06 DIAGNOSIS — R942 Abnormal results of pulmonary function studies: Secondary | ICD-10-CM | POA: Diagnosis not present

## 2017-10-06 DIAGNOSIS — E119 Type 2 diabetes mellitus without complications: Secondary | ICD-10-CM | POA: Diagnosis not present

## 2017-10-07 ENCOUNTER — Encounter: Payer: Self-pay | Admitting: Physician Assistant

## 2017-10-07 ENCOUNTER — Ambulatory Visit (INDEPENDENT_AMBULATORY_CARE_PROVIDER_SITE_OTHER): Payer: Medicare HMO | Admitting: Physician Assistant

## 2017-10-07 VITALS — BP 134/76 | HR 70 | Ht 69.0 in | Wt 241.0 lb

## 2017-10-07 DIAGNOSIS — I10 Essential (primary) hypertension: Secondary | ICD-10-CM | POA: Diagnosis not present

## 2017-10-07 DIAGNOSIS — I480 Paroxysmal atrial fibrillation: Secondary | ICD-10-CM

## 2017-10-07 DIAGNOSIS — E119 Type 2 diabetes mellitus without complications: Secondary | ICD-10-CM | POA: Diagnosis not present

## 2017-10-07 DIAGNOSIS — I2510 Atherosclerotic heart disease of native coronary artery without angina pectoris: Secondary | ICD-10-CM | POA: Diagnosis not present

## 2017-10-07 DIAGNOSIS — D751 Secondary polycythemia: Secondary | ICD-10-CM | POA: Diagnosis not present

## 2017-10-07 DIAGNOSIS — E785 Hyperlipidemia, unspecified: Secondary | ICD-10-CM

## 2017-10-07 DIAGNOSIS — I493 Ventricular premature depolarization: Secondary | ICD-10-CM | POA: Diagnosis not present

## 2017-10-07 DIAGNOSIS — N183 Chronic kidney disease, stage 3 unspecified: Secondary | ICD-10-CM

## 2017-10-07 DIAGNOSIS — Z794 Long term (current) use of insulin: Secondary | ICD-10-CM

## 2017-10-07 MED ORDER — HYDRALAZINE HCL 25 MG PO TABS: 25.0000 mg | ORAL_TABLET | Freq: Two times a day (BID) | ORAL | 3 refills | Status: DC

## 2017-10-07 NOTE — Patient Instructions (Addendum)
Medication Instructions:  Your physician has recommended you make the following change in your medication:  1) START Hydralazine 25mg  twice daily. And Rx has been sent to your pharmacy.  Labwork: Bmet today  Testing/Procedures: None ordered  Follow-Up: Follow up as planned with Dr.Crenshaw in November 2019  Please call Dr.Shadad's office to schedule an appointment with him asap Phone [phone]  Any Other Special Instructions Will Be Listed Below (If Applicable).     If you need a refill on your cardiac medications before your next appointment, please call your pharmacy.

## 2017-10-07 NOTE — Progress Notes (Addendum)
 Cardiology Office Note    Date:  10/07/2017   ID:  James Mcclain, DOB 06/25/1945, MRN [id number]  PCP:  Kartaoui, [place], FNP  Cardiologist:  Dr. Crenshaw  Chief Complaint  Patient presents with  . Follow-up    hyperkalemia followup.    History of Present Illness:  James Mcclain is a 72 y.o. male with PMH of CVA, PVCs, PAF, multifocal atrial tachycardia, hypertension, CAD, IDDM, CKD stage III, polycythemia and hyperlipidemia.  He had a recent heart cardiac catheterization on 09/21/2017 following abnormal Myoview which revealed 85% stenosis in the ramus treated with DES, patent LAD stent with up to 30% in-stent restenosis, 50% stenosis of the left circumflex, 50% stenosis of the proximal to mid and distal concomitant RCA.  Echo obtained on 08/24/2017 showed EF 50 to 55%, normal diastolic parameters.  He returned to the ED on 09/23/2017 with complaints of shortness of breath.  Cardiology was consulted and recommended a stat troponin to rule out ACS.  However patient left AMA prior to the second troponin.  He was generally unaware of his paroxysmal atrial fibrillation.  He had does have a implantable loop recorder in place.  Patient was last seen by Angela Duke in the office on 09/30/2017.  He was doing well at the time denying any significant chest pain or shortness of breath.  His aspirin has been stopped and currently on Plavix and Eliquis after recent stent placement.  Patient presents today for cardiology office visit.  His blood pressure at home has been ranging in the 1 30-1 60s.  I will add hydralazine 25 mg twice daily.  Otherwise he denies any discomfort.  I do not see any peak T waves on the recent EKG as well.  I will obtain a basic metabolic panel since he has been off of ACE inhibitor for the past week, I would expect his hyperkalemia to have resolved.  Recent lab work also shows he has quite elevated red blood cell count, this has been trending up since 2017.  His last office visit with Dr.  Shadad for his polycythemia was in February 2018.  I asked him to have another visit with Dr. Shadad.   Past Medical History:  Diagnosis Date  . Arthritis    "right hand" (09/21/2017)  . CHF (congestive heart failure) (HCC)   . CKD (chronic kidney disease) stage 3, GFR 30-59 ml/min (HCC)   . Coronary artery disease    a. s/p Xience DES to the LAD 07/2012. b. 09/21/17 Synergy stent to RI.   . Dilated aortic root (HCC)    a. Mildly dilated by echo 09/2013, f/u MRA scheduled for 12/2014.  . Elevated PSA   . Essential hypertension   . Gout    "on daily RX" (09/21/2017)  . Heart murmur   . History of kidney stones   . Hyperlipidemia   . Insulin dependent diabetes mellitus (HCC)   . 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 On tele 09/2013.  . Paroxysmal atrial fibrillation (HCC)   . PFO (patent foramen ovale)    a. By echo 09/2013.  . Polycythemia   . Prostate cancer (HCC) 07/15/12   Gleason 7, external beam radiation Tx, Dr. Budzyn  . PVC's (premature ventricular contractions)   . Stroke (HCC)    a. Cryptogenic, 09/2013, event monitor with NSR. Small PFO noted on echo but neg LE duplex.    Past Surgical History:  Procedure Laterality Date  . COLONOSCOPY WITH PROPOFOL N/A 02/22/2016     Procedure: COLONOSCOPY WITH PROPOFOL;  Surgeon: Patrick Hung, MD;  Location: [facility];  Service: Endoscopy;  Laterality: N/A;  . CORONARY ANGIOPLASTY WITH STENT PLACEMENT  08/05/2012   LAD, 1 stent  . CORONARY STENT INTERVENTION N/A 09/21/2017   Procedure: CORONARY STENT INTERVENTION;  Surgeon: Smith, Henry W, MD;  Location: [facility];  Service: Cardiovascular;  Laterality: N/A;  . EP IMPLANTABLE DEVICE N/A 08/23/2014   Procedure: Loop Recorder Insertion;  Surgeon: Gregg W Taylor, MD;  Location: [facility];  Service: Cardiovascular;  Laterality: N/A;  . INSERTION PROSTATE RADIATION SEED    . JOINT REPLACEMENT    . LEFT HEART CATH AND CORONARY ANGIOGRAPHY N/A 09/21/2017   Procedure: LEFT  HEART CATH AND CORONARY ANGIOGRAPHY;  Surgeon: Smith, Henry W, MD;  Location: [facility];  Service: Cardiovascular;  Laterality: N/A;  . LITHOTRIPSY  2006  . PERCUTANEOUS CORONARY STENT INTERVENTION (PCI-S) N/A 08/05/2012   Procedure: PERCUTANEOUS CORONARY STENT INTERVENTION (PCI-S);  Surgeon: Michael Cooper, MD;  Location: [facility];  Service: Cardiovascular;  Laterality: N/A;  . PROSTATE BIOPSY  07/15/2012; 09/23/2013  . TEE WITHOUT CARDIOVERSION N/A 09/27/2013   Procedure: TRANSESOPHAGEAL ECHOCARDIOGRAM (TEE);  Surgeon: Dalton S McLean, MD;  Location: [facility];  Service: Cardiovascular;  Laterality: N/A;  . TONSILLECTOMY  1990s  . TOTAL KNEE ARTHROPLASTY  01/24/2011   Procedure: TOTAL KNEE ARTHROPLASTY;  Surgeon: John L Graves;  Location: [facility];  Service: Orthopedics;  Laterality: Left;  COMPUTER ASSISTED LEFT  TOTAL KNEE REPLACEMENT. Anesthesia a combination of regional and general.    Current Medications: Outpatient Medications Prior to Visit  Medication Sig Dispense Refill  . albuterol (PROVENTIL HFA;VENTOLIN HFA) 108 (90 Base) MCG/ACT inhaler Inhale 2 puffs into the lungs every 6 (six) hours as needed for wheezing or shortness of breath. 1 Inhaler 0  . allopurinol (ZYLOPRIM) 100 MG tablet TAKE TWO TABLETS BY MOUTH ONCE DAILY 60 tablet 5  . amLODipine (NORVASC) 10 MG tablet Take 1 tablet (10 mg total) by mouth daily. 90 tablet 3  . apixaban (ELIQUIS) 5 MG TABS tablet Take 1 tablet (5 mg total) by mouth 2 (two) times daily. 180 tablet 3  . atorvastatin (LIPITOR) 80 MG tablet Take 1 tablet (80 mg total) by mouth daily at 6 PM. 90 tablet 3  . Blood Glucose Monitoring Suppl (ONETOUCH VERIO) w/Device KIT 1 Device by Does not apply route 2 (two) times daily. E11.9 1 kit 2  . carvedilol (COREG) 25 MG tablet Take 1 tablet (25 mg total) by mouth 2 (two) times daily. 180 tablet 3  . clopidogrel (PLAVIX) 75 MG tablet Take 1 tablet (75 mg total) by mouth daily with breakfast. 90 tablet 2    . Dapagliflozin Propanediol (FARXIGA PO) Take 5 mg by mouth.    . glucose blood test strip Check BS bid DX: E11.9 100 each 12  . Insulin Glargine (LANTUS SOLOSTAR) 100 UNIT/ML Solostar Pen Inject 45 Units into the skin daily. 15 mL 3  . Insulin Syringe-Needle U-100 (SAFETY INSULIN SYRINGES) 30G X 5/16" 0.5 ML MISC As needed for insulin 100 each 11  . JANUVIA 100 MG tablet TAKE 1 TABLET(100 MG) BY MOUTH DAILY 90 tablet 3  . trimethoprim-polymyxin b (POLYTRIM) ophthalmic solution Place 2 drops into both eyes every 4 (four) hours. 10 mL 0  . amLODipine (NORVASC) 10 MG tablet TAKE 1 TABLET BY MOUTH ONCE DAILY 90 tablet 3  . aspirin 81 MG tablet Take 1 tablet (81 mg total)   by mouth daily. Stop after 2 weeks. 14 tablet 0   No facility-administered medications prior to visit.      Allergies:   Betadine [povidone iodine]; Contrast media [iodinated diagnostic agents]; and Fish allergy   Social History   Socioeconomic History  . Marital status: Widowed    Spouse name: Not on file  . Number of children: 3  . Years of education: 12th  . Highest education level: Not on file  Occupational History  . Occupation: retired  Social Needs  . Financial resource strain: Not on file  . Food insecurity:    Worry: Not on file    Inability: Not on file  . Transportation needs:    Medical: Not on file    Non-medical: Not on file  Tobacco Use  . Smoking status: Never Smoker  . Smokeless tobacco: Never Used  Substance and Sexual Activity  . Alcohol use: Not Currently  . Drug use: Never  . Sexual activity: Not Currently  Lifestyle  . Physical activity:    Days per week: Not on file    Minutes per session: Not on file  . Stress: Not on file  Relationships  . Social connections:    Talks on phone: Not on file    Gets together: Not on file    Attends religious service: Not on file    Active member of club or organization: Not on file    Attends meetings of clubs or organizations: Not on file     Relationship status: Not on file  Other Topics Concern  . Not on file  Social History Narrative   [name]   Patient is right handed   Patient drinks coffee and sodas     Family History:  The patient's family history includes Cancer in his mother; Cervical cancer in his sister; Diabetes in his sister; Heart Problems in his brother, father, and unknown relative; Hypertension in his brother; Leukemia in his mother; Stroke in his brother and unknown relative.   ROS:   Please see the history of present illness.    ROS All other systems reviewed and are negative.   PHYSICAL EXAM:   VS:  BP 134/76 (BP Location: Left Arm, Patient Position: Sitting, Cuff Size: Normal)   Pulse 70   Ht 5' 9" (1.753 m)   Wt 241 lb (109.3 kg)   BMI 35.59 kg/m    GEN: Well nourished, well developed, in no acute distress  HEENT: normal  Neck: no JVD, carotid bruits, or masses Cardiac: regularly irregular without occasional skipped beat; no murmurs, rubs, or gallops,no edema  Respiratory:  clear to auscultation bilaterally, normal work of breathing GI: soft, nontender, nondistended, + BS MS: no deformity or atrophy  Skin: warm and dry, no rash Neuro:  Alert and Oriented x 3, Strength and sensation are intact Psych: euthymic mood, full affect  Wt Readings from Last 3 Encounters:  10/07/17 241 lb (109.3 kg)  09/30/17 243 lb 9.6 oz (110.5 kg)  09/22/17 245 lb 2.4 oz (111.2 kg)      Studies/Labs Reviewed:   EKG:  EKG is not ordered today.    Recent Labs: 06/15/2017: ALT 9 09/30/2017: Hemoglobin 16.6; Platelets 356 10/01/2017: BUN 22; Creatinine, Ser 1.86; Potassium 5.3; Sodium 141   Lipid Panel    Component Value Date/Time   CHOL 103 (L) 11/20/2015 0818   TRIG 82 11/20/2015 0818   HDL 32 (L) 11/20/2015 0818   CHOLHDL 3.2 11/20/2015 0818     VLDL 16 11/20/2015 0818   LDLCALC 55 11/20/2015 0818    Additional studies/ records that were reviewed today include:   Cath  09/21/2017  Codominant coronary anatomy  Left main is without significant obstruction.  The left main terminates in the trifurcation with the circumflex, ramus, and LAD.  LAD contains a segmental proximal to mid vessel stent that is widely patent with perhaps 35% ISR.  Circumflex is a dominant vessel giving origin to 4 obtuse marginal branches.  Irregularity with up to 50% stenosis in the proximal to mid circumflex in the AV groove is noted.  Ramus intermedius is sizable and contains ostial to proximal 50% calcified narrowing followed by a relatively focal 85% stenosis in the proximal segment.  RCA is codominant with the circumflex and contains irregularities up to 50% of the proximal to mid and distal vessel.  Successful PTCA and stent of the proximal segment of the ramus intermedius reducing an 85% stenosis with TIMI grade III flow  to less than 10% with TIMI grade III flow.  A Synergy 2.5 x 16 mm stent was used and postdilated to 3.0 mm in diameter.  RECOMMENDATIONS:   Aspirin, apixaban, and clopidogrel for 2 weeks then drop aspirin.  Clopidogrel and apixaban for total of 6 months.  Risk factor modification as determined by guidelines.    ASSESSMENT:    1. Coronary artery disease involving native coronary artery of native heart without angina pectoris   2. PVC's (premature ventricular contractions)   3. PAF (paroxysmal atrial fibrillation) (HCC)   4. CKD (chronic kidney disease), stage III (HCC)   5. Polycythemia   6. Hyperlipidemia, unspecified hyperlipidemia type   7. Controlled type 2 diabetes mellitus without complication, with long-term current use of insulin (HCC)   8. Essential hypertension      PLAN:  In order of problems listed above:  1. Hyperkalemia: Potassium 5.9 on 8/7, came down to 5.3 on 8/18.  Patient has since discontinued the lisinopril.  Recheck basic metabolic panel today.  2. CAD: Underwent DES to ramus intermedius.  Aspirin has been discontinued  after taking for 2 weeks.  Since then patient has been on Plavix and Eliquis.  Denies any chest pain.  Continue high-dose statin  3. PAF: On Eliquis and carvedilol.  No obvious sign recurrence  4. DM2: Managed by primary care provider.  On insulin at home.  Last hemoglobin A1c obtained on 09/21/2017 was 7.3  5. Polycythemia: Patient had a prior history of polycythemia, his last visit with Dr. Shadad of hematology service was in February 2018.  His white blood cells has continued to trend up for the past 2 years.  He will need another visit with hematology service.   6. Hyperlipidemia: Continue Lipitor, will defer annual lipid panel to primary care provider.  The last lipid panel in the epic system dated back to September 2017.  7. CKD stage III: Stable on recent lab work.  8. Hypertension: Blood pressure remain elevated, systolic blood pressure range in the 130-160s after the discontinuation of lisinopril.  Add hydralazine 25 mg twice daily.    Medication Adjustments/Labs and Tests Ordered: Current medicines are reviewed at length with the patient today.  Concerns regarding medicines are outlined above.  Medication changes, Labs and Tests ordered today are listed in the Patient Instructions below. Patient Instructions  Medication Instructions:  Your physician has recommended you make the following change in your medication:  1) START Hydralazine 25mg twice daily. And Rx has been sent to your   pharmacy.  Labwork: Bmet today  Testing/Procedures: None ordered  Follow-Up: Follow up as planned with Dr.Crenshaw in November 2019  Please call Dr.Shadad's office to schedule an appointment with him asap Phone [phone]  Any Other Special Instructions Will Be Listed Below (If Applicable).     If you need a refill on your cardiac medications before your next appointment, please call your pharmacy.      Signed, Hao Meng, PA  10/07/2017 1:30 PM    [facility]  [facility] [address], [place], [place]  [address] Phone: [phone]; Fax: [phone]   

## 2017-10-08 ENCOUNTER — Other Ambulatory Visit: Payer: Self-pay | Admitting: Oncology

## 2017-10-08 ENCOUNTER — Telehealth: Payer: Self-pay | Admitting: Oncology

## 2017-10-08 ENCOUNTER — Telehealth: Payer: Self-pay | Admitting: *Deleted

## 2017-10-08 DIAGNOSIS — D751 Secondary polycythemia: Secondary | ICD-10-CM

## 2017-10-08 LAB — BASIC METABOLIC PANEL
BUN/Creatinine Ratio: 13 (ref 10–24)
BUN: 22 mg/dL (ref 8–27)
CO2: 21 mmol/L (ref 20–29)
Calcium: 9.2 mg/dL (ref 8.6–10.2)
Chloride: 105 mmol/L (ref 96–106)
Creatinine, Ser: 1.75 mg/dL — ABNORMAL HIGH (ref 0.76–1.27)
GFR calc Af Amer: 44 mL/min/{1.73_m2} — ABNORMAL LOW (ref >59)
GFR calc non Af Amer: 38 mL/min/{1.73_m2} — ABNORMAL LOW (ref >59)
Glucose: 123 mg/dL — ABNORMAL HIGH (ref 65–99)
Potassium: 5.3 mmol/L — ABNORMAL HIGH (ref 3.5–5.2)
Sodium: 143 mmol/L (ref 134–144)

## 2017-10-08 NOTE — Progress Notes (Signed)
Kidney function slightly better, potassium unchanged compare to 1 week ago, still borderline high, but not dangerous. Recommend repeat BMET in 2-3 weeks.

## 2017-10-08 NOTE — Telephone Encounter (Signed)
Appts added and patient notified per 8/15 sch msg

## 2017-10-08 NOTE — Telephone Encounter (Signed)
Please let him know that he will be contacted with date and time. Message to scheduling sent.

## 2017-10-08 NOTE — Telephone Encounter (Signed)
LM on AM for patient to call me. Re: future appts

## 2017-10-08 NOTE — Telephone Encounter (Signed)
Patient calling to say dr Stanford Breed would like for patient to be seen by dr Alen Blew, re: abnormal labs. Last O.V. With dr Alen Blew was 04/03/16

## 2017-10-14 DIAGNOSIS — R9431 Abnormal electrocardiogram [ECG] [EKG]: Secondary | ICD-10-CM | POA: Diagnosis not present

## 2017-10-19 DIAGNOSIS — Z6836 Body mass index (BMI) 36.0-36.9, adult: Secondary | ICD-10-CM | POA: Diagnosis not present

## 2017-10-19 DIAGNOSIS — R718 Other abnormality of red blood cells: Secondary | ICD-10-CM | POA: Diagnosis not present

## 2017-10-19 DIAGNOSIS — E119 Type 2 diabetes mellitus without complications: Secondary | ICD-10-CM | POA: Diagnosis not present

## 2017-10-19 DIAGNOSIS — E559 Vitamin D deficiency, unspecified: Secondary | ICD-10-CM | POA: Diagnosis not present

## 2017-10-21 ENCOUNTER — Encounter (HOSPITAL_COMMUNITY): Payer: Self-pay | Admitting: *Deleted

## 2017-10-21 DIAGNOSIS — H2513 Age-related nuclear cataract, bilateral: Secondary | ICD-10-CM | POA: Diagnosis not present

## 2017-10-21 DIAGNOSIS — E119 Type 2 diabetes mellitus without complications: Secondary | ICD-10-CM | POA: Diagnosis not present

## 2017-10-23 ENCOUNTER — Encounter (HOSPITAL_COMMUNITY): Payer: Self-pay | Admitting: Emergency Medicine

## 2017-10-23 ENCOUNTER — Emergency Department (HOSPITAL_COMMUNITY): Payer: Medicare HMO

## 2017-10-23 ENCOUNTER — Other Ambulatory Visit: Payer: Self-pay | Admitting: Physician Assistant

## 2017-10-23 ENCOUNTER — Emergency Department (HOSPITAL_COMMUNITY)
Admission: EM | Admit: 2017-10-23 | Discharge: 2017-10-23 | Disposition: A | Discharge status: Home / Self Care | Payer: Medicare HMO | Attending: Emergency Medicine | Admitting: Emergency Medicine

## 2017-10-23 ENCOUNTER — Other Ambulatory Visit: Payer: Self-pay

## 2017-10-23 DIAGNOSIS — I25118 Atherosclerotic heart disease of native coronary artery with other forms of angina pectoris: Secondary | ICD-10-CM

## 2017-10-23 DIAGNOSIS — Z8673 Personal history of transient ischemic attack (TIA), and cerebral infarction without residual deficits: Secondary | ICD-10-CM | POA: Insufficient documentation

## 2017-10-23 DIAGNOSIS — R0602 Shortness of breath: Secondary | ICD-10-CM | POA: Diagnosis not present

## 2017-10-23 DIAGNOSIS — I480 Paroxysmal atrial fibrillation: Secondary | ICD-10-CM | POA: Insufficient documentation

## 2017-10-23 DIAGNOSIS — I130 Hypertensive heart and chronic kidney disease with heart failure and stage 1 through stage 4 chronic kidney disease, or unspecified chronic kidney disease: Secondary | ICD-10-CM | POA: Insufficient documentation

## 2017-10-23 DIAGNOSIS — R0609 Other forms of dyspnea: Secondary | ICD-10-CM | POA: Diagnosis not present

## 2017-10-23 DIAGNOSIS — E119 Type 2 diabetes mellitus without complications: Secondary | ICD-10-CM | POA: Insufficient documentation

## 2017-10-23 DIAGNOSIS — N183 Chronic kidney disease, stage 3 (moderate): Secondary | ICD-10-CM | POA: Insufficient documentation

## 2017-10-23 DIAGNOSIS — Z794 Long term (current) use of insulin: Secondary | ICD-10-CM | POA: Insufficient documentation

## 2017-10-23 DIAGNOSIS — E785 Hyperlipidemia, unspecified: Secondary | ICD-10-CM | POA: Insufficient documentation

## 2017-10-23 DIAGNOSIS — I2510 Atherosclerotic heart disease of native coronary artery without angina pectoris: Secondary | ICD-10-CM | POA: Insufficient documentation

## 2017-10-23 DIAGNOSIS — Z7901 Long term (current) use of anticoagulants: Secondary | ICD-10-CM | POA: Diagnosis not present

## 2017-10-23 DIAGNOSIS — I509 Heart failure, unspecified: Secondary | ICD-10-CM | POA: Diagnosis not present

## 2017-10-23 DIAGNOSIS — Z79899 Other long term (current) drug therapy: Secondary | ICD-10-CM | POA: Diagnosis not present

## 2017-10-23 DIAGNOSIS — Z7902 Long term (current) use of antithrombotics/antiplatelets: Secondary | ICD-10-CM | POA: Diagnosis not present

## 2017-10-23 LAB — BASIC METABOLIC PANEL
Anion gap: 8 (ref 5–15)
BUN: 22 mg/dL (ref 8–23)
CO2: 23 mmol/L (ref 22–32)
Calcium: 8.6 mg/dL — ABNORMAL LOW (ref 8.9–10.3)
Chloride: 110 mmol/L (ref 98–111)
Creatinine, Ser: 1.74 mg/dL — ABNORMAL HIGH (ref 0.61–1.24)
GFR calc Af Amer: 44 mL/min — ABNORMAL LOW (ref >60)
GFR calc non Af Amer: 38 mL/min — ABNORMAL LOW (ref >60)
Glucose, Bld: 114 mg/dL — ABNORMAL HIGH (ref 70–99)
Potassium: 4.5 mmol/L (ref 3.5–5.1)
Sodium: 141 mmol/L (ref 135–145)

## 2017-10-23 LAB — I-STAT TROPONIN, ED
Troponin i, poc: 0.06 ng/mL (ref 0.00–0.08)
Troponin i, poc: 0.07 ng/mL (ref 0.00–0.08)

## 2017-10-23 LAB — CBC
HCT: 59.4 % — ABNORMAL HIGH (ref 39.0–52.0)
Hemoglobin: 16.1 g/dL (ref 13.0–17.0)
MCH: 21.4 pg — ABNORMAL LOW (ref 26.0–34.0)
MCHC: 27.1 g/dL — ABNORMAL LOW (ref 30.0–36.0)
MCV: 78.8 fL (ref 78.0–100.0)
Platelets: 327 10*3/uL (ref 150–400)
RBC: 7.54 MIL/uL — ABNORMAL HIGH (ref 4.22–5.81)
RDW: 23.1 % — ABNORMAL HIGH (ref 11.5–15.5)
WBC: 11.3 10*3/uL — ABNORMAL HIGH (ref 4.0–10.5)

## 2017-10-23 LAB — BRAIN NATRIURETIC PEPTIDE: B Natriuretic Peptide: 585.5 pg/mL — ABNORMAL HIGH (ref 0.0–100.0)

## 2017-10-23 MED ORDER — ISOSORBIDE MONONITRATE ER 30 MG PO TB24: 30.0000 mg | ORAL_TABLET | Freq: Every day | ORAL | 11 refills | Status: DC

## 2017-10-23 MED ORDER — NITROGLYCERIN 0.4 MG SL SUBL: 0.4000 mg | SUBLINGUAL_TABLET | SUBLINGUAL | 3 refills | Status: AC | PRN

## 2017-10-23 NOTE — ED Provider Notes (Addendum)
[name] EMERGENCY DEPARTMENT Provider Note   CSN: [id number] Arrival date & time: 10/23/17  1113     History   Chief Complaint Chief Complaint  Patient presents with  . Shortness of Breath    HPI James Mcclain is a 72 y.o. male.  HPI James Mcclain is a 72 y.o. male with history of hypertension, coronary artery disease, chronic kidney disease stage III, CHF, paroxysmal A. fib, history of prostate cancer, history of CVA, presents to emergency department complaining of shortness of breath.  Patient had a stent placed 2 weeks ago.  He states that at that time he had severe exertional shortness of breath.  He states this time shortness of breath started approximately 2 hours prior to arrival after he finished working on his riding lawnmower.  He denies doing anything strenuous.  He states that since then the shortness of breath will come and go currently denies any.  He states shortness of breath is similar to when he had to get a stent just not as severe.  He denies any lower extremity swelling.  He denies any cough or congestion.  No upper respiratory symptoms.  Denies any fever or chills.  He denies any chest pain but states he did not have any chest pain in the past either.  He is on Plavix and Eliquis and took both this morning.  Past Medical History:  Diagnosis Date  . Arthritis    "right hand" (09/21/2017)  . CHF (congestive heart failure) ([place])   . CKD (chronic kidney disease) stage 3, GFR 30-59 ml/min (HCC)   . Coronary artery disease    a. s/p Xience DES to the LAD 07/2012. b. 09/21/17 Synergy stent to [place].   . Dilated aortic root ([place])    a. Mildly dilated by echo 09/2013, f/u MRA scheduled for 12/2014.  [name] Elevated PSA   . Essential hypertension   . Gout    "on daily RX" (09/21/2017)  . Heart murmur   . History of kidney stones   . Hyperlipidemia   . Insulin dependent diabetes mellitus ([place])   . 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 On tele 09/2013.  [name]  Paroxysmal atrial fibrillation (HCC)   . PFO (patent foramen ovale)    a. By echo 09/2013.  [name] Polycythemia   . Prostate cancer ([place]) 07/15/12   Gleason 7, external beam radiation Tx, Dr. Pilar Jarvis  . PVC's (premature ventricular contractions)   . Stroke [facility])    a. Cryptogenic, 09/2013, event monitor with NSR. Small PFO noted on echo but neg LE duplex.    Patient Active Problem List   Diagnosis Date Noted  . New-onset angina ([place]) 09/21/2017  . History of snoring 08/05/2017  . Neoplastic malignant related fatigue 08/05/2017  . 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 CKD (chronic kidney disease) stage 3, GFR 30-59 ml/min (HCC)   . Paroxysmal atrial fibrillation ([place]) 07/30/2015  . PFO (patent foramen ovale)   . Dilated aortic root ([place])   . PVC's (premature ventricular contractions)   . Insulin dependent diabetes mellitus ([place])   . Stroke ([place])   . Coronary artery disease   . Multifocal atrial tachycardia (HCC)   . Polycythemia 05/16/2014  . History of CVA (cerebrovascular accident) 01/02/2014  . 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 Essential hypertension 11/28/2013  . Hyperlipidemia 11/28/2013  . Cerebral infarction due to embolism of left middle cerebral artery ([place]) 11/28/2013  .  CAD (coronary artery disease) 12/17/2012  . Prostate cancer ([place])   . 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 SOB (shortness of breath) 06/23/2012  . Murmur 06/23/2012  . DM2 (diabetes mellitus, type 2) ([place])   . Elevated PSA     Past Surgical History:  Procedure Laterality Date  . COLONOSCOPY WITH PROPOFOL N/A 02/22/2016   Procedure: COLONOSCOPY WITH PROPOFOL;  Surgeon: Carol Ada, MD;  Location: [facility] ENDOSCOPY;  Service: Endoscopy;  Laterality: N/A;  . CORONARY ANGIOPLASTY WITH STENT PLACEMENT  08/05/2012   LAD, 1 stent  . CORONARY STENT INTERVENTION N/A 09/21/2017   Procedure: CORONARY STENT INTERVENTION;  Surgeon: Belva Crome, MD;  Location: [facility] CV LAB;  Service: Cardiovascular;  Laterality: N/A;  . EP IMPLANTABLE DEVICE N/A 08/23/2014   Procedure: Loop Recorder Insertion;  Surgeon: Evans Lance, MD;  Location: [facility] CV LAB;  Service: Cardiovascular;  Laterality: N/A;  . INSERTION PROSTATE RADIATION SEED    . JOINT REPLACEMENT    . LEFT HEART CATH AND CORONARY ANGIOGRAPHY N/A 09/21/2017   Procedure: LEFT HEART CATH AND CORONARY ANGIOGRAPHY;  Surgeon: Belva Crome, MD;  Location: [facility];  Service: Cardiovascular;  Laterality: N/A;  . LITHOTRIPSY  2006  . PERCUTANEOUS CORONARY STENT INTERVENTION (PCI-S) N/A 08/05/2012   Procedure: PERCUTANEOUS CORONARY STENT INTERVENTION (PCI-S);  Surgeon: Sherren Mocha, MD;  Location: [facility] CATH LAB;  Service: Cardiovascular;  Laterality: N/A;  . PROSTATE BIOPSY  07/15/2012; 09/23/2013  . TEE WITHOUT CARDIOVERSION N/A 09/27/2013   Procedure: TRANSESOPHAGEAL ECHOCARDIOGRAM (TEE);  Surgeon: Larey Dresser, MD;  Location: [facility];  Service: Cardiovascular;  Laterality: N/A;  . TONSILLECTOMY  1990s  . TOTAL KNEE ARTHROPLASTY  01/24/2011   Procedure: TOTAL KNEE ARTHROPLASTY;  Surgeon: Alta Corning;  Location: [facility];  Service: Orthopedics;  Laterality: Left;  COMPUTER ASSISTED LEFT  TOTAL KNEE REPLACEMENT. Anesthesia a combination of regional and general.        Home Medications    Prior to Admission medications   Medication Sig Start Date End Date Taking? Authorizing Provider  albuterol (PROVENTIL HFA;VENTOLIN HFA) 108 (90 Base) MCG/ACT inhaler Inhale 2 puffs into the lungs every 6 (six) hours as needed for wheezing or shortness of breath. 08/28/17   Susy Frizzle, MD  allopurinol (ZYLOPRIM) 100 MG tablet TAKE TWO TABLETS BY MOUTH ONCE DAILY 02/26/17   Susy Frizzle, MD  amLODipine (NORVASC) 10 MG tablet Take 1 tablet (10 mg total) by mouth daily. 09/19/16   Susy Frizzle, MD  apixaban (ELIQUIS) 5 MG TABS tablet Take 1 tablet (5 mg total)  by mouth 2 (two) times daily. 01/19/17   Susy Frizzle, MD  atorvastatin (LIPITOR) 80 MG tablet Take 1 tablet (80 mg total) by mouth daily at 6 PM. 09/22/17   Daune Perch, NP  Blood Glucose Monitoring Suppl (ONETOUCH VERIO) w/Device KIT 1 Device by Does not apply route 2 (two) times daily. E11.9 08/28/16   Susy Frizzle, MD  carvedilol (COREG) 25 MG tablet Take 1 tablet (25 mg total) by mouth 2 (two) times daily. 09/22/17 09/22/18  Daune Perch, NP  clopidogrel (PLAVIX) 75 MG tablet Take 1 tablet (75 mg total) by mouth daily with breakfast. 09/22/17   Daune Perch, NP  Dapagliflozin Propanediol (FARXIGA PO) Take 5 mg by mouth.    [provider]  glucose blood test strip Check BS bid DX: E11.9 08/28/16   Susy Frizzle, MD  hydrALAZINE (APRESOLINE)  25 MG tablet Take 1 tablet (25 mg total) by mouth 2 (two) times daily. 10/07/17 01/05/18  Almyra Deforest, PA  Insulin Glargine (LANTUS SOLOSTAR) 100 UNIT/ML Solostar Pen Inject 45 Units into the skin daily. 08/18/16   Susy Frizzle, MD  Insulin Syringe-Needle U-100 (SAFETY INSULIN SYRINGES) 30G X 5/16" 0.5 ML MISC As needed for insulin 06/11/17   Susy Frizzle, MD  JANUVIA 100 MG tablet TAKE 1 TABLET(100 MG) BY MOUTH DAILY 10/05/17   Susy Frizzle, MD  trimethoprim-polymyxin b (POLYTRIM) ophthalmic solution Place 2 drops into both eyes every 4 (four) hours. 04/21/17   Susy Frizzle, MD    Family History Family History  Problem Relation Age of Onset  . Leukemia Mother   . Cancer Mother   . Heart Problems Father   . Diabetes Sister        ? father  . Heart Problems Brother   . Heart Problems Unknown        family history  . Stroke Unknown   . Stroke Brother   . Hypertension Brother   . Cervical cancer Sister        ????  . Heart attack Neg Hx     Social History Social History   Tobacco Use  . Smoking status: Never Smoker  . Smokeless tobacco: Never Used  Substance Use Topics  . Alcohol use: Not Currently    . Drug use: Never     Allergies   Betadine [povidone iodine]; Contrast media [iodinated diagnostic agents]; and Fish allergy   Review of Systems Review of Systems  Constitutional: Negative for chills and fever.  Respiratory: Positive for shortness of breath. Negative for cough and chest tightness.   Cardiovascular: Negative for chest pain, palpitations and leg swelling.  Gastrointestinal: Negative for abdominal distention, abdominal pain, diarrhea, nausea and vomiting.  Genitourinary: Negative for dysuria, frequency, hematuria and urgency.  Musculoskeletal: Negative for arthralgias, myalgias, neck pain and neck stiffness.  Skin: Negative for rash.  Allergic/Immunologic: Negative for immunocompromised state.  Neurological: Negative for dizziness, weakness, light-headedness, numbness and headaches.  All other systems reviewed and are negative.    Physical Exam Updated Vital Signs BP 121/72 (BP Location: Left Arm)   Pulse 77   Temp 99 F (37.2 C) (Oral)   Resp 16   Ht _0  (1.753 m)   Wt 108.9 kg   SpO2 97%   BMI 35.44 kg/m   Physical Exam  Constitutional: He appears well-developed and well-nourished. No distress.  HENT:  Head: Normocephalic and atraumatic.  Eyes: Conjunctivae are normal.  Neck: Neck supple.  Cardiovascular: Normal rate, regular rhythm and normal heart sounds.  Pulmonary/Chest: Effort normal. No respiratory distress. He has no wheezes. He has no rales.  Abdominal: Soft. Bowel sounds are normal. He exhibits no distension. There is no tenderness. There is no rebound.  Musculoskeletal:       Right lower leg: He exhibits edema.  Trace pitting edema in bilateral LE   Neurological: He is alert.  Skin: Skin is warm and dry.  Nursing note and vitals reviewed.    ED Treatments / Results  Labs (all labs ordered are listed, but only abnormal results are displayed) Labs Reviewed  BASIC METABOLIC PANEL - Abnormal; Notable for the following components:       Result Value   Glucose, Bld 114 (*)    Creatinine, Ser 1.74 (*)    Calcium 8.6 (*)    GFR calc non Af Amer 38 (*)  GFR calc Af Amer 44 (*)    All other components within normal limits  CBC - Abnormal; Notable for the following components:   WBC 11.3 (*)    RBC 7.54 (*)    HCT 59.4 (*)    MCH 21.4 (*)    MCHC 27.1 (*)    RDW 23.1 (*)    All other components within normal limits  BRAIN NATRIURETIC PEPTIDE  I-STAT TROPONIN, ED  I-STAT TROPONIN, ED    EKG EKG Interpretation  Date/Time:  Friday October 23 2017 11:23:06 EDT Ventricular Rate:  78 PR Interval:  178 QRS Duration: 140 QT Interval:  414 QTC Calculation: 471 R Axis:   -63 Text Interpretation:  Sinus rhythm with occasional Premature ventricular complexes and Premature atrial complexes Right bundle branch block Left anterior fascicular block no sig change from previous Confirmed by Charlesetta Shanks [phone]) on 10/23/2017 12:07:47 PM   Radiology Dg Chest 2 View  Result Date: 10/23/2017 CLINICAL DATA:  Acute onset of shortness of breath that began earlier today. Two weeks post coronary artery stenting. EXAM: CHEST - 2 VIEW COMPARISON:  09/23/2017, 03/15/2015 and earlier, including CTA chest 08/24/2017. FINDINGS: Cardiac silhouette mildly enlarged, unchanged. LAD coronary artery stent. Thoracic aorta mildly tortuous and atherosclerotic, unchanged. Hilar and mediastinal contours otherwise unremarkable. Mild linear scarring in the lower lobes, unchanged. Lungs otherwise clear. Pulmonary vascularity normal. Bronchovascular markings normal. No pleural effusions. Degenerative changes and DISH involving the thoracic spine. IMPRESSION: Stable mild cardiomegaly.  No acute cardiopulmonary disease. Electronically Signed   By: Evangeline Dakin M.D.   On: 10/23/2017 12:12    Procedures Procedures (including critical care time)  Medications Ordered in ED Medications - No data to display   Initial Impression / Assessment and Plan / ED  Course  I have reviewed the triage vital signs and the nursing notes.  Pertinent labs & imaging results that were available during my care of the patient were reviewed by me and considered in my medical decision making (see chart for details).     Patient emergency department shortness of breath that started after riding his lawnmower today.  Symptoms began just 2 hours prior to arrival.  Very similar symptoms to when he had to have a stent placed which was actually on 09/21/2017 [place] on record review not 2 weeks as he told me.  Will check labs and chest x-ray.  His EKG is unchanged.  1:07 PM Patient's initial troponin is negative.  Labs with no significant abnormality to explain his symptoms otherwise.  I discussed patient with cardiology, they will come by see him.  3:17 PM  Patient waiting for cardiology consult.  Delta troponin ordered.  3:43 PM Seen by cardiology and cleared for dc home after 2nd trop. Will get nitro and imdur prescriptions sent to pharmacy by cards.   Vitals:   10/23/17 1127 10/23/17 1129  BP:  121/72  Pulse:  77  Resp:  16  Temp:  99 F (37.2 C)  TempSrc:  Oral  SpO2:  97%  Weight: 108.9 kg   Height: _0  (1.753 m)       Final Clinical Impressions(s) / ED Diagnoses   Final diagnoses:  Shortness of breath    ED Discharge Orders    None          Dijuan, [place], PA-C 10/23/17 1544    Charlesetta Shanks, MD 10/26/17 [phone]

## 2017-10-23 NOTE — Discharge Instructions (Addendum)
Take nitroglycerine and Imdur as directed by cardiologist. Follow up as instructed by them. Return to ER as needed.

## 2017-10-23 NOTE — Consult Note (Addendum)
The patient has been seen in conjunction with Almyra Deforest, PAC. All aspects of care have been considered and discussed. The patient has been personally interviewed, examined, and all clinical data has been reviewed.   Symptoms could represent angina.  Recent stent result was reviewed.  Ramus intermedius ostial narrowing could be contributing to symptoms despite stent implantation in maximal vessel relieving significant stenosis.  Start Imdur 30 mg daily.  Sublingual nitro if recurring prolonged chest discomfort greater than 5 minutes.  Clinical follow-up within 1 to 2 weeks.  If discomfort is related to ramus intermedius, best approach is medical therapy unless ACS forces our hand.   Cardiology Consultation:   Patient ID: James Mcclain; [id number]; September 14, 1945   Admit date: 10/23/2017 Date of Consult: 10/23/2017  Primary Care Provider: [name], [place] Primary Cardiologist: Kirk Ruths, MD  Primary Electrophysiologist:  N/A   Patient Profile:   James Mcclain is a 72 y.o. male with a hx of CVA, PVCs, PAF, multifocal atrial tachycardia, hypertension, CAD, IDDM, CKD stage III, polycythemia and hyperlipidemia who is being seen today for the evaluation of dyspnea at the request of Dr. Johnney Killian.  History of Present Illness:   [name] is a 73 y.o. male with PMH of CVA, PVCs, PAF, multifocal atrial tachycardia, hypertension, CAD, IDDM, CKD stage III, polycythemia and hyperlipidemia.  Patient underwent cardiac catheterization on 09/21/2017 following an abnormal Myoview which revealed 85% stenosis in the ramus, this was treated with drug-eluting stent.  Patient also had patent LAD stent with up to 30% in-stent restenosis, 50% stenosis in the left circumflex, 50% stenosis in the proximal to mid and distal concomitant RCA.  Echocardiogram obtained on 08/24/2017 showed EF 50 to 45%, normal diastolic parameters.  He returned to the ED on 09/23/2017 with complaints of shortness of breath.  Cardiology was  consulted and recommended stat troponin to rule out ACS.  However patient left AMA prior to the second troponin.  I recently saw the patient in the clinic on 10/07/2017, he was doing well at the time.  Blood pressure mildly elevated, I added hydralazine 25 mg twice daily.  Due to increasing white blood cell count, I asked him to follow-up with Dr. Alen Blew of hematology service for his polycythemia.  His aspirin was discontinued after 2 weeks and that he was on Plavix and Eliquis.  Patient was riding a lawnmower today cutting grass, afterward, he walked around and this started having shortness of breath that lasted about an hour.  This eventually went away and has not recurred again.  He sought medical attention at [facility], renal function is stable when compared to the previous lab work.  Red blood cell count remain elevated.  Chest x-ray was negative for acute issue.  On physical exam, he has 1+ right lower extremity edema, no left lower extremity edema.  EKG showed normal sinus rhythm with right bundle branch block.   Past Medical History:  Diagnosis Date  . Arthritis    "right hand" (09/21/2017)  . CHF (congestive heart failure) ([place])   . CKD (chronic kidney disease) stage 3, GFR 30-59 ml/min (HCC)   . Coronary artery disease    a. s/p Xience DES to the LAD 07/2012. b. 09/21/17 Synergy stent to RI.   . Dilated aortic root ([place])    a. Mildly dilated by echo 09/2013, f/u MRA scheduled for 12/2014.  [name] Elevated PSA   . Essential hypertension   . Gout    "on daily RX" (09/21/2017)  .  Heart murmur   . History of kidney stones   . Hyperlipidemia   . Insulin dependent diabetes mellitus ([place])   . 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 On tele 09/2013.  [name] Paroxysmal atrial fibrillation (HCC)   . PFO (patent foramen ovale)    a. By echo 09/2013.  [name] Polycythemia   . Prostate cancer ([place]) 07/15/12   Gleason 7, external beam radiation Tx, Dr. Pilar Jarvis  . PVC's (premature ventricular  contractions)   . Stroke [facility])    a. Cryptogenic, 09/2013, event monitor with NSR. Small PFO noted on echo but neg LE duplex.    Past Surgical History:  Procedure Laterality Date  . COLONOSCOPY WITH PROPOFOL N/A 02/22/2016   Procedure: COLONOSCOPY WITH PROPOFOL;  Surgeon: Carol Ada, MD;  Location: [facility];  Service: Endoscopy;  Laterality: N/A;  . CORONARY ANGIOPLASTY WITH STENT PLACEMENT  08/05/2012   LAD, 1 stent  . CORONARY STENT INTERVENTION N/A 09/21/2017   Procedure: CORONARY STENT INTERVENTION;  Surgeon: Belva Crome, MD;  Location: [place] CV LAB;  Service: Cardiovascular;  Laterality: N/A;  . EP IMPLANTABLE DEVICE N/A 08/23/2014   Procedure: Loop Recorder Insertion;  Surgeon: Evans Lance, MD;  Location: [place] CV LAB;  Service: Cardiovascular;  Laterality: N/A;  . INSERTION PROSTATE RADIATION SEED    . JOINT REPLACEMENT    . LEFT HEART CATH AND CORONARY ANGIOGRAPHY N/A 09/21/2017   Procedure: LEFT HEART CATH AND CORONARY ANGIOGRAPHY;  Surgeon: Belva Crome, MD;  Location: [place] CV LAB;  Service: Cardiovascular;  Laterality: N/A;  . LITHOTRIPSY  2006  . PERCUTANEOUS CORONARY STENT INTERVENTION (PCI-S) N/A 08/05/2012   Procedure: PERCUTANEOUS CORONARY STENT INTERVENTION (PCI-S);  Surgeon: Sherren Mocha, MD;  Location: [facility] CATH LAB;  Service: Cardiovascular;  Laterality: N/A;  . PROSTATE BIOPSY  07/15/2012; 09/23/2013  . TEE WITHOUT CARDIOVERSION N/A 09/27/2013   Procedure: TRANSESOPHAGEAL ECHOCARDIOGRAM (TEE);  Surgeon: Larey Dresser, MD;  Location: [place];  Service: Cardiovascular;  Laterality: N/A;  . TONSILLECTOMY  1990s  . TOTAL KNEE ARTHROPLASTY  01/24/2011   Procedure: TOTAL KNEE ARTHROPLASTY;  Surgeon: Alta Corning;  Location: [place];  Service: Orthopedics;  Laterality: Left;  COMPUTER ASSISTED LEFT  TOTAL KNEE REPLACEMENT. Anesthesia a combination of regional and general.       Inpatient Medications: Scheduled Meds:  Continuous  Infusions:  PRN Meds:   Allergies:    Allergies  Allergen Reactions  . Betadine [Povidone Iodine] Other (See Comments)    Patient doesn't remember  . Contrast Media [Iodinated Diagnostic Agents] Other (See Comments)    Patient doesn't remember  . Fish Allergy Swelling    Social History:   Social History   Socioeconomic History  . Marital status: Widowed    Spouse name: Not on file  . Number of children: 3  . Years of education: 12th  . Highest education level: Not on file  Occupational History  . Occupation: retired  Scientific laboratory technician  . Financial resource strain: Not on file  . Food insecurity:    Worry: Not on file    Inability: Not on file  . Transportation needs:    Medical: Not on file    Non-medical: Not on file  Tobacco Use  . Smoking status: Never Smoker  . Smokeless tobacco: Never Used  Substance and Sexual Activity  . Alcohol use: Not Currently  . Drug use: Never  . Sexual activity: Not Currently  Lifestyle  . Physical activity:  Days per week: Not on file    Minutes per session: Not on file  . Stress: Not on file  Relationships  . Social connections:    Talks on phone: Not on file    Gets together: Not on file    Attends religious service: Not on file    Active member of club or organization: Not on file    Attends meetings of clubs or organizations: Not on file    Relationship status: Not on file  . Intimate partner violence:    Fear of current or ex partner: Not on file    Emotionally abused: Not on file    Physically abused: Not on file    Forced sexual activity: Not on file  Other Topics Concern  . Not on file  Social History Narrative   [name]   Patient is right handed   Patient drinks coffee and sodas    Family History:    Family History  Problem Relation Age of Onset  . Leukemia Mother   . Cancer Mother   . Heart Problems Father   . Diabetes Sister        ? father  . Heart Problems Brother   .  Heart Problems Unknown        family history  . Stroke Unknown   . Stroke Brother   . Hypertension Brother   . Cervical cancer Sister        ????  . Heart attack Neg Hx      ROS:  Please see the history of present illness.   All other ROS reviewed and negative.     Physical Exam/Data:   Vitals:   10/23/17 1127 10/23/17 1129  BP:  121/72  Pulse:  77  Resp:  16  Temp:  99 F (37.2 C)  TempSrc:  Oral  SpO2:  97%  Weight: 108.9 kg   Height: 5\' 9"  (1.753 m)    No intake or output data in the 24 hours ending 10/23/17 1459 Filed Weights   10/23/17 1127  Weight: 108.9 kg   Body mass index is 35.44 kg/m.  General:  Well nourished, well developed, in no acute distress HEENT: normal Lymph: no adenopathy Neck: no JVD Endocrine:  No thryomegaly Vascular: No carotid bruits; FA pulses 2+ bilaterally without bruits  Cardiac:  normal S1, S2; RRR; no murmur  Lungs:  clear to auscultation bilaterally, no wheezing, rhonchi or rales  Abd: soft, nontender, no hepatomegaly  Ext: no edema Musculoskeletal:  No deformities, BUE and BLE strength normal and equal Skin: warm and dry  Neuro:  CNs 2-12 intact, no focal abnormalities noted Psych:  Normal affect   EKG:  The EKG was personally reviewed and demonstrates: Atrial rhythm, chronic right bundle branch block  Telemetry:  Telemetry was personally reviewed and demonstrates: Sinus rhythm without significant ventricular ectopy.  Relevant CV Studies:  Cath 09/21/2017  Codominant coronary anatomy  Left main is without significant obstruction. The left main terminates in the trifurcation with the circumflex, ramus, and LAD.  LAD contains a segmental proximal to mid vessel stent that is widely patent with perhaps 35% ISR.  Circumflex is a dominant vessel giving origin to 4 obtuse marginal branches. Irregularity with up to 50% stenosis in the proximal to mid circumflex in the AV groove is noted.  Ramus intermedius is sizable and  contains ostial to proximal 50% calcified narrowing followed by a relatively focal 85% stenosis in the proximal  segment.  RCA is codominant with the circumflex and contains irregularities up to 50% of the proximal to mid and distal vessel.  Successful PTCA and stent of the proximal segment of the ramus intermedius reducing an 85% stenosis with TIMI grade III flow to less than 10% with TIMI grade III flow. A Synergy 2.5 x 16 mm stent was used and postdilated to 3.0 mm in diameter.  RECOMMENDATIONS:   Aspirin, apixaban, and clopidogrel for 2 weeks then drop aspirin.  Clopidogrel and apixaban for total of 6 months.  Risk factor modification as determined by guidelines.  Laboratory Data:  Chemistry Recent Labs  Lab 10/23/17 1137  NA 141  K 4.5  CL 110  CO2 23  GLUCOSE 114*  BUN 22  CREATININE 1.74*  CALCIUM 8.6*  GFRNONAA 38*  GFRAA 44*  ANIONGAP 8    No results for input(s): PROT, ALBUMIN, AST, ALT, ALKPHOS, BILITOT in the last 168 hours. Hematology Recent Labs  Lab 10/23/17 1137  WBC 11.3*  RBC 7.54*  HGB 16.1  HCT 59.4*  MCV 78.8  MCH 21.4*  MCHC 27.1*  RDW 23.1*  PLT 327   Cardiac EnzymesNo results for input(s): TROPONINI in the last 168 hours.  Recent Labs  Lab 10/23/17 1144  TROPIPOC 0.06    BNPNo results for input(s): BNP, PROBNP in the last 168 hours.  DDimer No results for input(s): DDIMER in the last 168 hours.  Radiology/Studies:  Dg Chest 2 View  Result Date: 10/23/2017 CLINICAL DATA:  Acute onset of shortness of breath that began earlier today. Two weeks post coronary artery stenting. EXAM: CHEST - 2 VIEW COMPARISON:  09/23/2017, 03/15/2015 and earlier, including CTA chest 08/24/2017. FINDINGS: Cardiac silhouette mildly enlarged, unchanged. LAD coronary artery stent. Thoracic aorta mildly tortuous and atherosclerotic, unchanged. Hilar and mediastinal contours otherwise unremarkable. Mild linear scarring in the lower lobes, unchanged. Lungs  otherwise clear. Pulmonary vascularity normal. Bronchovascular markings normal. No pleural effusions. Degenerative changes and DISH involving the thoracic spine. IMPRESSION: Stable mild cardiomegaly.  No acute cardiopulmonary disease. Electronically Signed   By: Evangeline Dakin M.D.   On: 10/23/2017 12:12    Assessment and Plan:   1. Dyspnea: Symptoms similar to what he experienced prior to the stent placement, however this is the second episode and second time he came to the emergency room since the stent placement.  We will discussed the case with MD, hard to say if there is possibility of in-stent restenosis. May need to repeat outpatient myoview to assess for ischemia  -He appears to be euvolemic on physical exam.  Even though he has trace amount of a right lower extremity edema, however left lower extremity is normal and there is no crackles on physical exam.  Chest x-ray is normal.  Suspicion for significant volume overload very low.  Suspicion for DVT and PE very low either given the fact that patient is on Eliquis.  Addendum: discussed with Dr. Tamala Julian, his symptom is nonspecific. Will attempt to medically manage for now. [place] for discharge if 2nd trop is normal. I have sent Rx of sublingual nitroglycerine and Imdur to his pharmacy.   2. CAD: Recently underwent DES to ramus.  Previously placed LAD stent patent.  Continued aspirin for 2 weeks before stopping, currently on Plavix along with Eliquis  3. PAF: On Eliquis.  Maintaining normal sinus rhythm on current rate control therapy.  4. Hyperlipidemia: Continue Lipitor  5. Insulin-dependent diabetes mellitus: Managed by primary care provider  6. CKD stage III:  Renal function stable on exam.   For questions or updates, please contact [place] Please consult [email or web address] for contact info under Cardiology/STEMI.   Hilbert Corrigan, [place]  10/23/2017 2:59 PM

## 2017-10-23 NOTE — ED Provider Notes (Signed)
Medical screening examination/treatment/procedure(s) were conducted as a shared visit with non-physician practitioner(s) and myself.  I personally evaluated the patient during the encounter.  EKG Interpretation  Date/Time:  Friday October 23 2017 11:23:06 EDT Ventricular Rate:  78 PR Interval:  178 QRS Duration: 140 QT Interval:  414 QTC Calculation: 471 R Axis:   -63 Text Interpretation:  Sinus rhythm with occasional Premature ventricular complexes and Premature atrial complexes Right bundle branch block Left anterior fascicular block no sig change from previous Confirmed by Charlesetta Shanks [phone]) on 10/23/2017 12:07:47 PM Patient is status post stent placement 2 weeks ago.  Approximately 2 hours prior to arrival he started developing some exertional shortness of breath after riding his lawnmower.  Patient is alert and appropriate.  Mental status clear.  Heart regular no gross rub murmur gallop.  Lungs clear to auscultation no wheeze rhonchi or rale.  No significant peripheral edema.  Patient is two-week status post stent placement agree with cardiology consultation for final disposition.   Charlesetta Shanks, MD 10/23/17 1407

## 2017-10-23 NOTE — ED Triage Notes (Signed)
Onset of shortness of breath today worsening while ambulating denies chest pain. States did have a cardiac stent placed two weeks ago. Airway intact unlabored breathing.

## 2017-10-27 ENCOUNTER — Ambulatory Visit (INDEPENDENT_AMBULATORY_CARE_PROVIDER_SITE_OTHER): Payer: Medicare HMO | Admitting: *Deleted

## 2017-10-27 DIAGNOSIS — Z1211 Encounter for screening for malignant neoplasm of colon: Secondary | ICD-10-CM | POA: Diagnosis not present

## 2017-10-27 DIAGNOSIS — I6389 Other cerebral infarction: Secondary | ICD-10-CM | POA: Diagnosis not present

## 2017-10-27 NOTE — Progress Notes (Signed)
Carelink Summary Report / Loop Recorder 

## 2017-11-03 LAB — CUP PACEART REMOTE DEVICE CHECK
Date Time Interrogation Session: 20190730203804
Implantable Pulse Generator Implant Date: 20160629

## 2017-11-09 ENCOUNTER — Telehealth (HOSPITAL_COMMUNITY): Payer: Self-pay | Admitting: Pharmacist

## 2017-11-09 ENCOUNTER — Telehealth: Payer: Self-pay | Admitting: Cardiology

## 2017-11-09 NOTE — Telephone Encounter (Signed)
Cardiac Rehab Medication Review by a Pharmacist  Does the patient  feel that his/her medications are working for him/her?  yes  Has the patient been experiencing any side effects to the medications prescribed?  no  Does the patient measure his/her own blood pressure or blood glucose at home?  no   Does the patient have any problems obtaining medications due to transportation or finances? Yes, he notes his medication cost about $45/month and this is a big financial strain for him.  Understanding of regimen: poor Understanding of indications: poor Potential of compliance: poor  Pharmacist comments: Patient overall seems to have a poor understanding of his medication regimen and is not sure of the indications for his medications. In addition, we spoke for a long time on the phone trying to discern whether or not he is taking his clopidogrel (Plavix). Patient states he is not currently taking clopidogrel and does not think he has ever been told to take this medication. Will send inbox message notifying Dr. Tamala Julian.  Thank you for involving pharmacy in this patient's care.  Janae Bridgeman, PharmD PGY1 Pharmacy Resident Phone: [phone] 11/09/2017 3:33 PM

## 2017-11-09 NOTE — Telephone Encounter (Signed)
Left message to call back  

## 2017-11-09 NOTE — Telephone Encounter (Signed)
New Message:    Carlette with the cardiac rehab stated the patient has not been taking his clopidogrel (PLAVIX) 75 MG tablet and stated he was never told to start medication.

## 2017-11-10 NOTE — Telephone Encounter (Signed)
Left message to call back  

## 2017-11-10 NOTE — Telephone Encounter (Signed)
He should be on plavix (clopidogrel) and Eliquis.  Please verify. If not on Plavix, he should start with a 300 mg load and the 75 mg daily.  If not on plavix, this could explain CP.   ===View-only below this line===  ----- Message ----- From: Lorenda Ishihara, [facility] Sent: 11/09/2017   3:38 PM EDT To: Belva Crome, MD, Rowe Pavy, RN Subject: Cardiac Rehab MedHx                            Hello Dr. Tamala Julian, I spoke with James Mcclain this afternoon to verify his medication list prior to his cardiac rehab appointment this Thursday 9/19. He tells me he is not currently taking his clopidogrel and it looks like he had a recent stent placement 09/21/2017. I know this will be addressed at his upcoming appointment but I wanted to be sure that you were aware of this discrepancy.  Thank you, Janae Bridgeman [phone])

## 2017-11-10 NOTE — Telephone Encounter (Signed)
[place] pt

## 2017-11-11 ENCOUNTER — Telehealth: Payer: Self-pay | Admitting: Cardiology

## 2017-11-11 ENCOUNTER — Telehealth (HOSPITAL_COMMUNITY): Payer: Self-pay | Admitting: *Deleted

## 2017-11-11 NOTE — Telephone Encounter (Signed)
Duplicate phone message-see other telephone note

## 2017-11-11 NOTE — Telephone Encounter (Signed)
Spoke with Verdis Frederickson from Cardiac Rehab and she informed me that the patient will need to reschedule his appointment today due to a recent visit to the emergency department. Patient needs to be seen in the office again before attending cardiac rehab. Patient also still needs to be informed of medication administration of Plavix as states on previous note from Dr. Tamala Julian.   Left message for patient to return call to inform him of this. Will continue efforts.

## 2017-11-11 NOTE — Progress Notes (Signed)
Gery N Ivancic 72 y.o. male DOB 01/25/1946 MRN [id number]       Nutrition  No diagnosis found. Past Medical History:  Diagnosis Date  . Arthritis    "right hand" (09/21/2017)  . CHF (congestive heart failure) (HCC)   . CKD (chronic kidney disease) stage 3, GFR 30-59 ml/min (HCC)   . Coronary artery disease    a. s/p Xience DES to the LAD 07/2012. b. 09/21/17 Synergy stent to RI.   . Dilated aortic root (HCC)    a. Mildly dilated by echo 09/2013, f/u MRA scheduled for 12/2014.  . Elevated PSA   . Essential hypertension   . Gout    "on daily RX" (09/21/2017)  . Heart murmur   . History of kidney stones   . Hyperlipidemia   . Insulin dependent diabetes mellitus (HCC)   . 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 On tele 09/2013.  . Paroxysmal atrial fibrillation (HCC)   . PFO (patent foramen ovale)    a. By echo 09/2013.  . Polycythemia   . Prostate cancer (HCC) 07/15/12   Gleason 7, external beam radiation Tx, Dr. Budzyn  . PVC's (premature ventricular contractions)   . Stroke (HCC)    a. Cryptogenic, 09/2013, event monitor with NSR. Small PFO noted on echo but neg LE duplex.   Meds reviewed.    Current Outpatient Medications (Endocrine & Metabolic):  .  Dapagliflozin Propanediol (FARXIGA PO), Take 5 mg by mouth. .  Insulin Glargine (LANTUS SOLOSTAR) 100 UNIT/ML Solostar Pen, Inject 45 Units into the skin daily. .  JANUVIA 100 MG tablet, TAKE 1 TABLET(100 MG) BY MOUTH DAILY  Current Outpatient Medications (Cardiovascular):  .  amLODipine (NORVASC) 10 MG tablet, Take 1 tablet (10 mg total) by mouth daily. .  atorvastatin (LIPITOR) 80 MG tablet, Take 1 tablet (80 mg total) by mouth daily at 6 PM. .  carvedilol (COREG) 25 MG tablet, Take 1 tablet (25 mg total) by mouth 2 (two) times daily. .  hydrALAZINE (APRESOLINE) 25 MG tablet, Take 1 tablet (25 mg total) by mouth 2 (two) times daily. .  isosorbide mononitrate (IMDUR) 30 MG 24 hr tablet, Take 1 tablet (30 mg total) by mouth daily.  (Patient not taking: Reported on 11/09/2017) .  lisinopril (PRINIVIL,ZESTRIL) 40 MG tablet, Take 40 mg by mouth daily. .  nitroGLYCERIN (NITROSTAT) 0.4 MG SL tablet, Place 1 tablet (0.4 mg total) under the tongue every 5 (five) minutes as needed for chest pain. Do not take more than 3 nitroglycerine  Current Outpatient Medications (Respiratory):  .  albuterol (PROVENTIL HFA;VENTOLIN HFA) 108 (90 Base) MCG/ACT inhaler, Inhale 2 puffs into the lungs every 6 (six) hours as needed for wheezing or shortness of breath.  Current Outpatient Medications (Analgesics):  .  allopurinol (ZYLOPRIM) 100 MG tablet, TAKE TWO TABLETS BY MOUTH ONCE DAILY  Current Outpatient Medications (Hematological):  .  apixaban (ELIQUIS) 5 MG TABS tablet, Take 1 tablet (5 mg total) by mouth 2 (two) times daily. .  clopidogrel (PLAVIX) 75 MG tablet, Take 1 tablet (75 mg total) by mouth daily with breakfast. (Patient not taking: Reported on 11/09/2017)  Current Outpatient Medications (Other):  .  Blood Glucose Monitoring Suppl (ONETOUCH VERIO) w/Device KIT, 1 Device by Does not apply route 2 (two) times daily. E11.9 .  glucose blood test strip, Check BS bid DX: E11.9 .  Insulin Syringe-Needle U-100 (SAFETY INSULIN SYRINGES) 30G X 5/16" 0.5 ML MISC, As needed for insulin .  trimethoprim-polymyxin b (POLYTRIM)   ophthalmic solution, Place 2 drops into both eyes every 4 (four) hours. (Patient not taking: Reported on 11/09/2017)   HT: Ht Readings from Last 1 Encounters:  10/23/17 5' 9" (1.753 m)    WT: Wt Readings from Last 5 Encounters:  10/23/17 240 lb (108.9 kg)  10/07/17 241 lb (109.3 kg)  09/30/17 243 lb 9.6 oz (110.5 kg)  09/22/17 245 lb 2.4 oz (111.2 kg)  09/16/17 243 lb 9.6 oz (110.5 kg)     BMI 35.44  Current tobacco use? No       Labs:  Lipid Panel     Component Value Date/Time   CHOL 103 (L) 11/20/2015 0818   TRIG 82 11/20/2015 0818   HDL 32 (L) 11/20/2015 0818   CHOLHDL 3.2 11/20/2015 0818   VLDL 16  11/20/2015 0818   LDLCALC 55 11/20/2015 0818    Lab Results  Component Value Date   HGBA1C 7.3 (H) 09/21/2017   CBG (last 3)  No results for input(s): GLUCAP in the last 72 hours.  Nutrition Diagnosis ? Food-and nutrition-related knowledge deficit related to lack of exposure to information as related to diagnosis of: ? CVD ? DM  ? Obesity related to excessive energy intake as evidenced by a 35.44  Nutrition Goal(s):  ? To be determined  Plan:  Pt to attend nutrition classes ? Nutrition I ? Nutrition II ? Portion Distortion  ? Diabetes Blitz ? Diabetes Q & A Will provide client-centered nutrition education as part of interdisciplinary care.   Monitor and evaluate progress toward nutrition goal with team.  Aubrey Burklin, MS, RD, LDN 11/11/2017 8:36 AM 

## 2017-11-12 ENCOUNTER — Ambulatory Visit: Payer: Medicare HMO | Admitting: Family Medicine

## 2017-11-12 ENCOUNTER — Encounter (HOSPITAL_COMMUNITY)
Admission: RE | Admit: 2017-11-12 | Discharge: 2017-11-12 | Disposition: A | Discharge status: Home / Self Care | Payer: Medicare HMO | Source: Ambulatory Visit | Attending: Cardiology | Admitting: Cardiology

## 2017-11-12 ENCOUNTER — Telehealth: Payer: Self-pay | Admitting: Cardiology

## 2017-11-12 MED ORDER — CLOPIDOGREL BISULFATE 75 MG PO TABS: 75.0000 mg | ORAL_TABLET | Freq: Every day | ORAL | 5 refills | Status: DC

## 2017-11-12 NOTE — Telephone Encounter (Signed)
Left second message for patient to return call.

## 2017-11-12 NOTE — Telephone Encounter (Signed)
Refill of plavix 75 mg sent to [place] patient aware.

## 2017-11-12 NOTE — Telephone Encounter (Signed)
Patient came in office after he was unable to attend cardiac rehab today. He needs a follow up appointment due to recent emergency department visit. Was unable to reach patient on the phone. In the office I verified with him that he needs appointment with Dr. Stanford Breed, we moved this up to October 7th. I also verified that patient isn't taking plavix, I advised him to start this medication and verified with pharmacy and Dr. Thompson Caul note to take 300 mg loading dose on first day and then afterwards he will start 75 mg daily. Patient verbally understands. He will call our office with any concerns.

## 2017-11-12 NOTE — Telephone Encounter (Signed)
Called patient pharmacy. They report that the patient did pick up medication from pharmacy. Left message for patient to return call regarding this.

## 2017-11-12 NOTE — Telephone Encounter (Signed)
New Message:     Patient states he is to start taking clopidogrel (PLAVIX) 75 MG tablet but his insurance will not pay for the medication until October 13th.  Patient is requesting a replacement until the insurance will pay

## 2017-11-12 NOTE — Progress Notes (Signed)
Pt presented to cardiac rehab for orientation.  Pt needs to be seen in his cardiologist office before he can start exercise regarding recent ED admission and medication management.  Staff from office and cardiac rehab have attempted to reach pt.  See previous telephone notes.  Pt has changed his phone number recently.  This has been updated in chart. Pt informed of the need to go to the office today for scheduling and information regarding Plavix.  Pt verbalized understanding and is going to the cardiologist office now.  Triage called to make aware that pt will be coming to office.

## 2017-11-13 DIAGNOSIS — M79604 Pain in right leg: Secondary | ICD-10-CM | POA: Diagnosis not present

## 2017-11-13 DIAGNOSIS — Z7901 Long term (current) use of anticoagulants: Secondary | ICD-10-CM | POA: Diagnosis not present

## 2017-11-13 DIAGNOSIS — M79605 Pain in left leg: Secondary | ICD-10-CM | POA: Diagnosis not present

## 2017-11-13 DIAGNOSIS — S8010XA Contusion of unspecified lower leg, initial encounter: Secondary | ICD-10-CM | POA: Diagnosis not present

## 2017-11-16 ENCOUNTER — Ambulatory Visit (HOSPITAL_COMMUNITY): Payer: Medicare HMO

## 2017-11-18 ENCOUNTER — Ambulatory Visit (HOSPITAL_COMMUNITY): Payer: Medicare HMO

## 2017-11-18 DIAGNOSIS — E559 Vitamin D deficiency, unspecified: Secondary | ICD-10-CM | POA: Diagnosis not present

## 2017-11-18 DIAGNOSIS — M7989 Other specified soft tissue disorders: Secondary | ICD-10-CM | POA: Diagnosis not present

## 2017-11-18 DIAGNOSIS — R899 Unspecified abnormal finding in specimens from other organs, systems and tissues: Secondary | ICD-10-CM | POA: Diagnosis not present

## 2017-11-19 ENCOUNTER — Ambulatory Visit: Payer: PPO | Admitting: Cardiology

## 2017-11-19 DIAGNOSIS — R0989 Other specified symptoms and signs involving the circulatory and respiratory systems: Secondary | ICD-10-CM | POA: Diagnosis not present

## 2017-11-19 DIAGNOSIS — M79605 Pain in left leg: Secondary | ICD-10-CM | POA: Diagnosis not present

## 2017-11-19 DIAGNOSIS — M79604 Pain in right leg: Secondary | ICD-10-CM | POA: Diagnosis not present

## 2017-11-20 ENCOUNTER — Ambulatory Visit (HOSPITAL_COMMUNITY): Payer: Medicare HMO

## 2017-11-20 LAB — CUP PACEART REMOTE DEVICE CHECK
Date Time Interrogation Session: 20190901213948
Implantable Pulse Generator Implant Date: 20160629

## 2017-11-20 IMAGING — CR DG CHEST 2V
2 series · 2 of 2 positions shown · non-contrast
Comparison: 06/01/2014

CLINICAL DATA: Right-sided chest pain/ tightness.

EXAM:
CHEST  2 VIEW

[w chest pa]
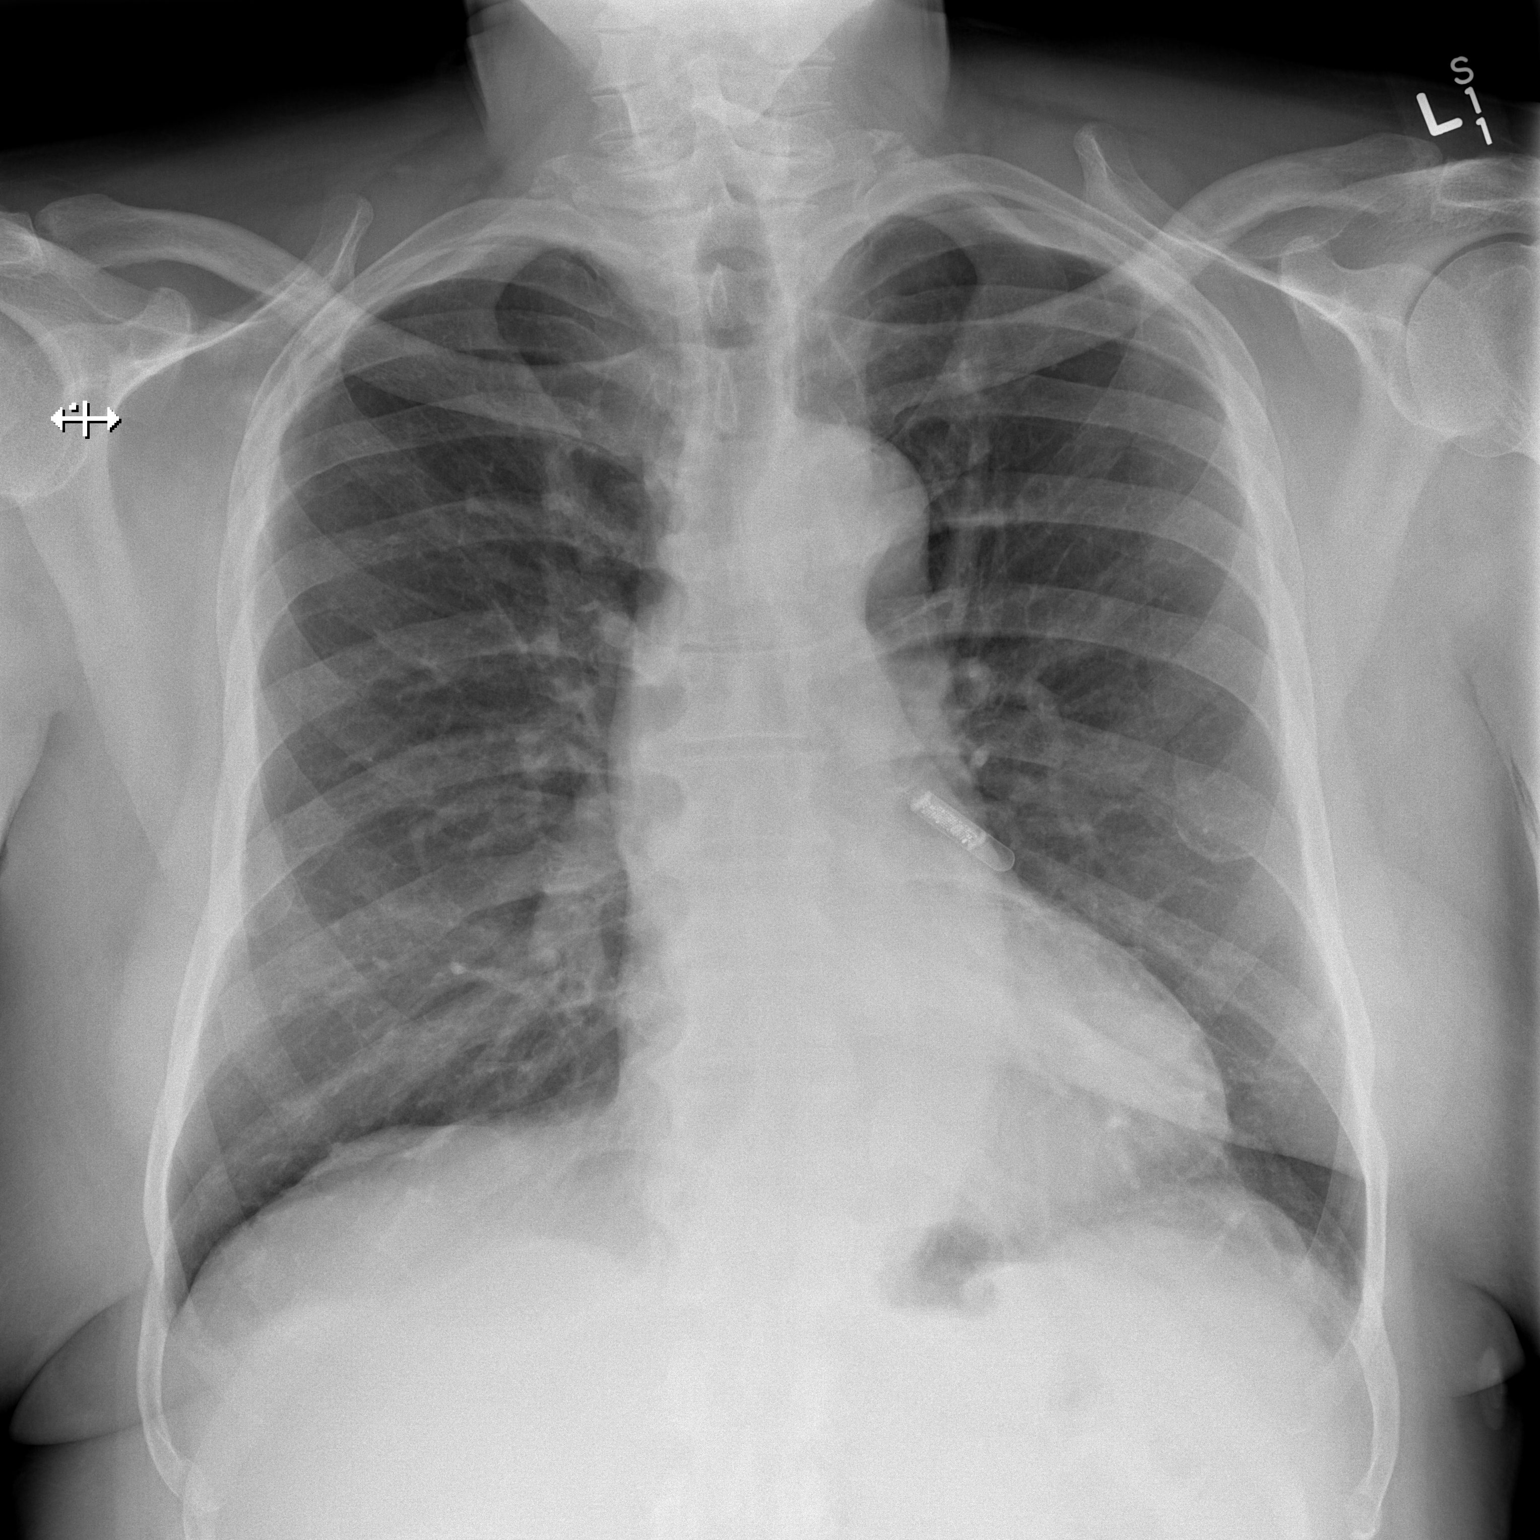

[w chest lat]
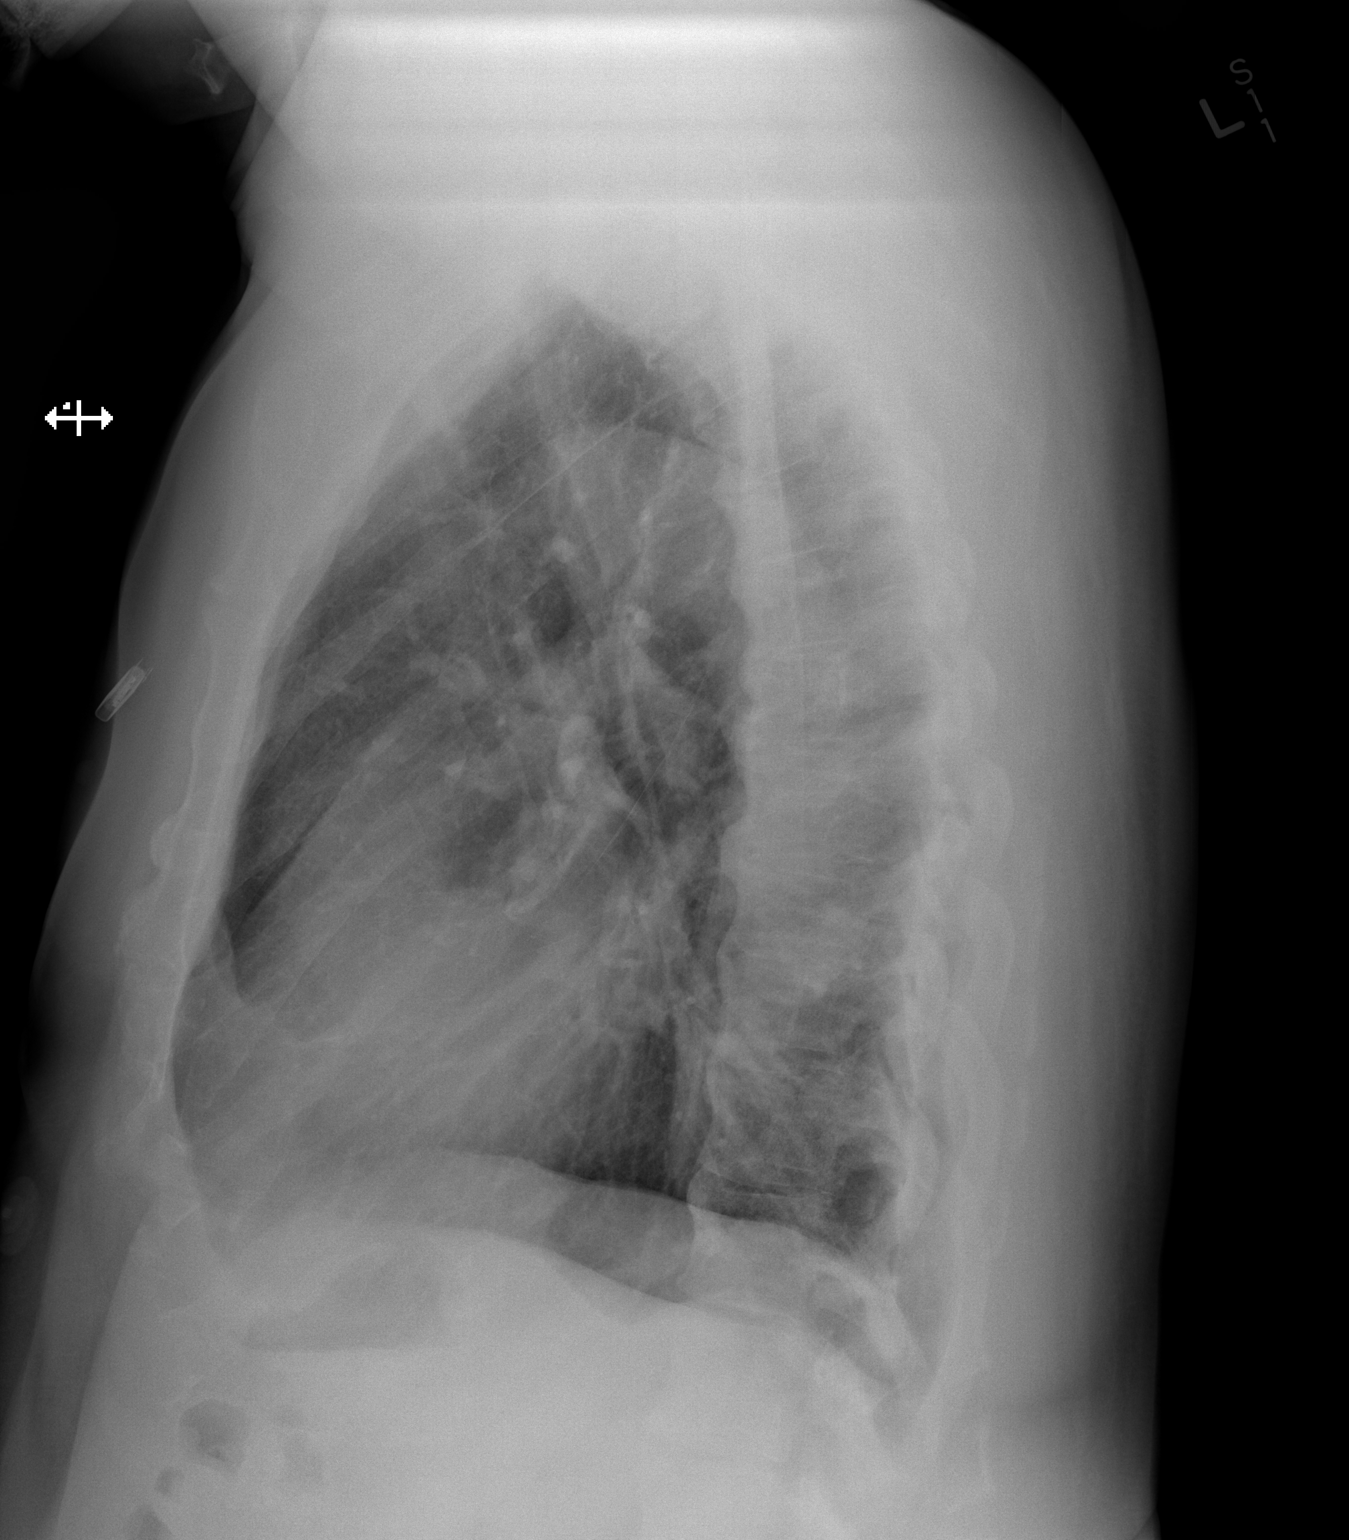

[2 of 2 positions shown; findings below may reference images not displayed]

FINDINGS: The cardiac silhouette is within normal limits. Tortuosity of the
thoracic aorta is unchanged. A loop recorder is noted. The lungs are
well inflated and clear. No pleural effusion or pneumothorax is
identified. Thoracic spondylosis is noted.
IMPRESSION: No active cardiopulmonary disease.

## 2017-11-23 ENCOUNTER — Ambulatory Visit (HOSPITAL_COMMUNITY): Payer: Medicare HMO

## 2017-11-24 NOTE — Progress Notes (Signed)
HPI: FU PAF and CAD.  Patient previously followed by Dr. Aundra Dubin.  He had a cryptogenic stroke in 2015 treated with tPA and a fu loop monitor showed paroxysmal atrial fibrillation.  He has been anticoagulated for that.  Patient had a cardiac catheterization in June 2014 which showed a 90% proximal LAD and he had PCI at that time. Also with PFO documented by prior TEE. Last echo 7/19 showed normal LV function, trace AI, mild LAE. Cath 7/19 showed 35 LAD, 50 Lcx, 50/85 RI and 50 RCA. Had PCI of RI with DES. CTA 7/19 showed TAA 4.5 cm. Seen with dyspnea 8/19 and imdur added. Since last seen, the patient has dyspnea with more extreme activities but not with routine activities. It is relieved with rest. It is not associated with chest pain. There is no orthopnea, PND or pedal edema. There is no syncope or palpitations. There is no exertional chest pain.   Current Outpatient Medications  Medication Sig Dispense Refill  . albuterol (PROVENTIL HFA;VENTOLIN HFA) 108 (90 Base) MCG/ACT inhaler Inhale 2 puffs into the lungs every 6 (six) hours as needed for wheezing or shortness of breath. 1 Inhaler 0  . allopurinol (ZYLOPRIM) 100 MG tablet TAKE TWO TABLETS BY MOUTH ONCE DAILY 60 tablet 5  . amLODipine (NORVASC) 10 MG tablet Take 1 tablet (10 mg total) by mouth daily. 90 tablet 3  . apixaban (ELIQUIS) 5 MG TABS tablet Take 1 tablet (5 mg total) by mouth 2 (two) times daily. 180 tablet 3  . atorvastatin (LIPITOR) 80 MG tablet Take 1 tablet (80 mg total) by mouth daily at 6 PM. 90 tablet 3  . Blood Glucose Monitoring Suppl (ONETOUCH VERIO) w/Device KIT 1 Device by Does not apply route 2 (two) times daily. E11.9 1 kit 2  . carvedilol (COREG) 25 MG tablet Take 1 tablet (25 mg total) by mouth 2 (two) times daily. 180 tablet 3  . clopidogrel (PLAVIX) 75 MG tablet Take 1 tablet (75 mg total) by mouth daily with breakfast. 34 tablet 5  . Dapagliflozin Propanediol (FARXIGA PO) Take 5 mg by mouth.    [name] glucose  blood test strip Check BS bid DX: E11.9 100 each 12  . hydrALAZINE (APRESOLINE) 25 MG tablet Take 1 tablet (25 mg total) by mouth 2 (two) times daily. 180 tablet 3  . Insulin Glargine (LANTUS SOLOSTAR) 100 UNIT/ML Solostar Pen Inject 45 Units into the skin daily. 15 mL 3  . Insulin Syringe-Needle U-100 (SAFETY INSULIN SYRINGES) 30G X 5/16" 0.5 ML MISC As needed for insulin 100 each 11  . isosorbide mononitrate (IMDUR) 30 MG 24 hr tablet Take 1 tablet (30 mg total) by mouth daily. 30 tablet 11  . JANUVIA 100 MG tablet TAKE 1 TABLET(100 MG) BY MOUTH DAILY 90 tablet 3  . lisinopril (PRINIVIL,ZESTRIL) 40 MG tablet Take 40 mg by mouth daily.    . nitroGLYCERIN (NITROSTAT) 0.4 MG SL tablet Place 1 tablet (0.4 mg total) under the tongue every 5 (five) minutes as needed for chest pain. Do not take more than 3 nitroglycerine 25 tablet 3  . trimethoprim-polymyxin b (POLYTRIM) ophthalmic solution Place 2 drops into both eyes every 4 (four) hours. 10 mL 0   No current facility-administered medications for this visit.      Past Medical History:  Diagnosis Date  . Arthritis    "right hand" (09/21/2017)  . CHF (congestive heart failure) ([place])   . CKD (chronic kidney disease) stage 3, GFR 30-59  ml/min ([place])   . Coronary artery disease    a. s/p Xience DES to the LAD 07/2012. b. 09/21/17 Synergy stent to RI.   . Dilated aortic root ([place])    a. Mildly dilated by echo 09/2013, f/u MRA scheduled for 12/2014.  [name] Elevated PSA   . Essential hypertension   . Gout    "on daily RX" (09/21/2017)  . Heart murmur   . History of kidney stones   . Hyperlipidemia   . Insulin dependent diabetes mellitus ([place])   . 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 On tele 09/2013.  [name] Paroxysmal atrial fibrillation ([facility])   . PFO (patent foramen ovale)    a. By echo 09/2013.  [name] Polycythemia   . Prostate cancer ([place]) 07/15/12   Gleason 7, external beam radiation Tx, Dr. Pilar Jarvis  . PVC's (premature ventricular contractions)   .  Stroke [facility])    a. Cryptogenic, 09/2013, event monitor with NSR. Small PFO noted on echo but neg LE duplex.    Past Surgical History:  Procedure Laterality Date  . COLONOSCOPY WITH PROPOFOL N/A 02/22/2016   Procedure: COLONOSCOPY WITH PROPOFOL;  Surgeon: Carol Ada, MD;  Location: [facility];  Service: Endoscopy;  Laterality: N/A;  . CORONARY ANGIOPLASTY WITH STENT PLACEMENT  08/05/2012   LAD, 1 stent  . CORONARY STENT INTERVENTION N/A 09/21/2017   Procedure: CORONARY STENT INTERVENTION;  Surgeon: Belva Crome, MD;  Location: [place] CV LAB;  Service: Cardiovascular;  Laterality: N/A;  . EP IMPLANTABLE DEVICE N/A 08/23/2014   Procedure: Loop Recorder Insertion;  Surgeon: Evans Lance, MD;  Location: [place] CV LAB;  Service: Cardiovascular;  Laterality: N/A;  . INSERTION PROSTATE RADIATION SEED    . JOINT REPLACEMENT    . LEFT HEART CATH AND CORONARY ANGIOGRAPHY N/A 09/21/2017   Procedure: LEFT HEART CATH AND CORONARY ANGIOGRAPHY;  Surgeon: Belva Crome, MD;  Location: [place] CV LAB;  Service: Cardiovascular;  Laterality: N/A;  . LITHOTRIPSY  2006  . PERCUTANEOUS CORONARY STENT INTERVENTION (PCI-S) N/A 08/05/2012   Procedure: PERCUTANEOUS CORONARY STENT INTERVENTION (PCI-S);  Surgeon: Sherren Mocha, MD;  Location[facility] CATH LAB;  Service: Cardiovascular;  Laterality: N/A;  . PROSTATE BIOPSY  07/15/2012; 09/23/2013  . TEE WITHOUT CARDIOVERSION N/A 09/27/2013   Procedure: TRANSESOPHAGEAL ECHOCARDIOGRAM (TEE);  Surgeon: Larey Dresser, MD;  Location: [place];  Service: Cardiovascular;  Laterality: N/A;  . TONSILLECTOMY  1990s  . TOTAL KNEE ARTHROPLASTY  01/24/2011   Procedure: TOTAL KNEE ARTHROPLASTY;  Surgeon: Alta Corning;  Location: [place];  Service: Orthopedics;  Laterality: Left;  COMPUTER ASSISTED LEFT  TOTAL KNEE REPLACEMENT. Anesthesia a combination of regional and general.    Social History   Socioeconomic History  . Marital status: Widowed    Spouse name:  Not on file  . Number of children: 3  . Years of education: 12th  . Highest education level: Not on file  Occupational History  . Occupation: retired  Scientific laboratory technician  . Financial resource strain: Not on file  . Food insecurity:    Worry: Not on file    Inability: Not on file  . Transportation needs:    Medical: Not on file    Non-medical: Not on file  Tobacco Use  . Smoking status: Never Smoker  . Smokeless tobacco: Never Used  Substance and Sexual Activity  . Alcohol use: Not Currently  . Drug use: Never  . Sexual activity: Not Currently  Lifestyle  . Physical  activity:    Days per week: Not on file    Minutes per session: Not on file  . Stress: Not on file  Relationships  . Social connections:    Talks on phone: Not on file    Gets together: Not on file    Attends religious service: Not on file    Active member of club or organization: Not on file    Attends meetings of clubs or organizations: Not on file    Relationship status: Not on file  . Intimate partner violence:    Fear of current or ex partner: Not on file    Emotionally abused: Not on file    Physically abused: Not on file    Forced sexual activity: Not on file  Other Topics Concern  . Not on file  Social History Narrative   [name]   Patient is right handed   Patient drinks coffee and sodas    Family History  Problem Relation Age of Onset  . Leukemia Mother   . Cancer Mother   . Heart Problems Father   . Diabetes Sister        ? father  . Heart Problems Brother   . Heart Problems Unknown        family history  . Stroke Unknown   . Stroke Brother   . Hypertension Brother   . Cervical cancer Sister        ????  . Heart attack Neg Hx     ROS: no fevers or chills, productive cough, hemoptysis, dysphasia, odynophagia, melena, hematochezia, dysuria, hematuria, rash, seizure activity, orthopnea, PND, pedal edema, claudication. Remaining systems are negative.  Physical  Exam: Well-developed well-nourished in no acute distress.  Skin is warm and dry.  HEENT is normal.  Neck is supple.  Chest is clear to auscultation with normal expansion.  Cardiovascular exam is regular rate and rhythm.  Abdominal exam nontender or distended. No masses palpated. Extremities show no edema. neuro grossly intact  A/P  1 paroxysmal atrial fibrillation-patient remains in sinus rhythm on examination today.  Continue carvedilol for rate control if atrial fibrillation recurs.  Continue apixaban.  2 thoracic aortic aneurysm-he will need a follow-up CTA July 2020.  3 coronary artery disease-continue Plavix and statin.  4 hypertension-patient's blood pressure is controlled.  Continue present medications.  5 hyperlipidemia-continue statin.  6 prior cryptogenic CVA-atrial fibrillation documented.  He will require lifelong anticoagulation.  7 chronic stage III kidney disease  Kirk Ruths, MD

## 2017-11-25 ENCOUNTER — Ambulatory Visit (HOSPITAL_COMMUNITY): Payer: Medicare HMO

## 2017-11-27 ENCOUNTER — Ambulatory Visit (HOSPITAL_COMMUNITY): Payer: Medicare HMO

## 2017-11-27 ENCOUNTER — Ambulatory Visit (INDEPENDENT_AMBULATORY_CARE_PROVIDER_SITE_OTHER): Payer: Medicare HMO | Admitting: *Deleted

## 2017-11-27 DIAGNOSIS — I6389 Other cerebral infarction: Secondary | ICD-10-CM

## 2017-11-28 NOTE — Progress Notes (Signed)
Carelink Summary Report / Loop Recorder 

## 2017-11-30 ENCOUNTER — Ambulatory Visit (HOSPITAL_COMMUNITY): Payer: Medicare HMO

## 2017-11-30 ENCOUNTER — Encounter: Payer: Self-pay | Admitting: Cardiology

## 2017-11-30 ENCOUNTER — Ambulatory Visit: Payer: Medicare HMO | Admitting: Cardiology

## 2017-11-30 VITALS — BP 130/68 | HR 77 | Ht 70.0 in | Wt 250.0 lb

## 2017-11-30 DIAGNOSIS — E7800 Pure hypercholesterolemia, unspecified: Secondary | ICD-10-CM

## 2017-11-30 DIAGNOSIS — I2510 Atherosclerotic heart disease of native coronary artery without angina pectoris: Secondary | ICD-10-CM

## 2017-11-30 DIAGNOSIS — R42 Dizziness and giddiness: Secondary | ICD-10-CM | POA: Diagnosis not present

## 2017-11-30 DIAGNOSIS — I480 Paroxysmal atrial fibrillation: Secondary | ICD-10-CM | POA: Diagnosis not present

## 2017-11-30 DIAGNOSIS — I10 Essential (primary) hypertension: Secondary | ICD-10-CM | POA: Diagnosis not present

## 2017-11-30 DIAGNOSIS — M7989 Other specified soft tissue disorders: Secondary | ICD-10-CM | POA: Diagnosis not present

## 2017-11-30 NOTE — Patient Instructions (Signed)

## 2017-12-01 ENCOUNTER — Telehealth (HOSPITAL_COMMUNITY): Payer: Self-pay

## 2017-12-01 LAB — CUP PACEART REMOTE DEVICE CHECK
Date Time Interrogation Session: 20191004213631
Implantable Pulse Generator Implant Date: 20160629

## 2017-12-01 NOTE — Telephone Encounter (Signed)
Patient will come in for orientation on 01/28/18 @ 8:15AM and will attend the 9:45AM exercise class.  Mailed homework package.

## 2017-12-02 ENCOUNTER — Ambulatory Visit (HOSPITAL_COMMUNITY): Payer: Medicare HMO

## 2017-12-04 ENCOUNTER — Ambulatory Visit (HOSPITAL_COMMUNITY): Payer: Medicare HMO

## 2017-12-07 ENCOUNTER — Ambulatory Visit (HOSPITAL_COMMUNITY): Payer: Medicare HMO

## 2017-12-07 DIAGNOSIS — H539 Unspecified visual disturbance: Secondary | ICD-10-CM | POA: Diagnosis not present

## 2017-12-07 DIAGNOSIS — E119 Type 2 diabetes mellitus without complications: Secondary | ICD-10-CM | POA: Diagnosis not present

## 2017-12-07 DIAGNOSIS — R42 Dizziness and giddiness: Secondary | ICD-10-CM | POA: Diagnosis not present

## 2017-12-07 DIAGNOSIS — I10 Essential (primary) hypertension: Secondary | ICD-10-CM | POA: Diagnosis not present

## 2017-12-08 ENCOUNTER — Telehealth: Payer: Self-pay | Admitting: Cardiology

## 2017-12-08 NOTE — Telephone Encounter (Signed)
Spoke w/ pt and requested that he send a manual transmission b/c his home monitor has not updated in at least 14 days.   

## 2017-12-09 ENCOUNTER — Ambulatory Visit (HOSPITAL_COMMUNITY): Payer: Medicare HMO

## 2017-12-11 ENCOUNTER — Ambulatory Visit (HOSPITAL_COMMUNITY): Payer: Medicare HMO

## 2017-12-14 ENCOUNTER — Ambulatory Visit (HOSPITAL_COMMUNITY): Payer: Medicare HMO

## 2017-12-15 DIAGNOSIS — H5210 Myopia, unspecified eye: Secondary | ICD-10-CM | POA: Diagnosis not present

## 2017-12-16 ENCOUNTER — Ambulatory Visit (HOSPITAL_COMMUNITY): Payer: Medicare HMO

## 2017-12-17 ENCOUNTER — Inpatient Hospital Stay: Payer: Medicare HMO | Attending: Oncology

## 2017-12-17 ENCOUNTER — Telehealth: Payer: Self-pay | Admitting: Oncology

## 2017-12-17 ENCOUNTER — Inpatient Hospital Stay (HOSPITAL_BASED_OUTPATIENT_CLINIC_OR_DEPARTMENT_OTHER): Payer: Medicare HMO | Admitting: Oncology

## 2017-12-17 VITALS — BP 117/68 | HR 78 | Temp 98.3°F | Resp 18 | Ht 70.0 in | Wt 246.4 lb

## 2017-12-17 DIAGNOSIS — Z7901 Long term (current) use of anticoagulants: Secondary | ICD-10-CM | POA: Insufficient documentation

## 2017-12-17 DIAGNOSIS — Z79899 Other long term (current) drug therapy: Secondary | ICD-10-CM | POA: Diagnosis not present

## 2017-12-17 DIAGNOSIS — Z923 Personal history of irradiation: Secondary | ICD-10-CM | POA: Insufficient documentation

## 2017-12-17 DIAGNOSIS — Z8546 Personal history of malignant neoplasm of prostate: Secondary | ICD-10-CM | POA: Insufficient documentation

## 2017-12-17 DIAGNOSIS — Z794 Long term (current) use of insulin: Secondary | ICD-10-CM

## 2017-12-17 DIAGNOSIS — D751 Secondary polycythemia: Secondary | ICD-10-CM | POA: Diagnosis not present

## 2017-12-17 LAB — CBC WITH DIFFERENTIAL (CANCER CENTER ONLY)
Abs Immature Granulocytes: 0.1 10*3/uL — ABNORMAL HIGH (ref 0.00–0.07)
Basophils Absolute: 0 10*3/uL (ref 0.0–0.1)
Basophils Relative: 0 %
Eosinophils Absolute: 0.3 10*3/uL (ref 0.0–0.5)
Eosinophils Relative: 3 %
HCT: 58.7 % — ABNORMAL HIGH (ref 39.0–52.0)
Hemoglobin: 16 g/dL (ref 13.0–17.0)
Immature Granulocytes: 1 %
Lymphocytes Relative: 13 %
Lymphs Abs: 1.3 10*3/uL (ref 0.7–4.0)
MCH: 20.8 pg — ABNORMAL LOW (ref 26.0–34.0)
MCHC: 27.3 g/dL — ABNORMAL LOW (ref 30.0–36.0)
MCV: 76.4 fL — ABNORMAL LOW (ref 80.0–100.0)
Monocytes Absolute: 0.9 10*3/uL (ref 0.1–1.0)
Monocytes Relative: 9 %
Neutro Abs: 7.7 10*3/uL (ref 1.7–7.7)
Neutrophils Relative %: 74 %
Platelet Count: 377 10*3/uL (ref 150–400)
RBC: 7.68 MIL/uL — ABNORMAL HIGH (ref 4.22–5.81)
RDW: 23.7 % — ABNORMAL HIGH (ref 11.5–15.5)
WBC Count: 10.4 10*3/uL (ref 4.0–10.5)
nRBC: 0.5 % — ABNORMAL HIGH (ref 0.0–0.2)

## 2017-12-17 NOTE — Telephone Encounter (Signed)
Scheduled apt per 10/24 los - gave patient AVS and calender per los.

## 2017-12-17 NOTE — Progress Notes (Signed)
Hematology and Oncology Follow Up Visit  James Mcclain [id number] 01/17/1946 72 y.o. 12/17/2017 9:51 AM James Mcclain, James Mcclain, Kaloni.Humble, FNP   Principle Diagnosis: 72 year old man:  1. Polycythemia diagnosed in July 2015 after presenting with a hemoglobin of 20 and a JAK 2 positive mutation detected on February 2016.   2. Prostate cancer and July 2015. He had a Gleason score of 3+4 = 7 and his PSA was up to 8.4. He is status post radiation therapy completed in March 2017.    Prior Therapy:   He received intermittent phlebotomies between March and July 2016 to keep his hematocrit less than 45  Current therapy:  He continues to be on aspirin and full dose anticoagulation with Eliquis.  Phlebotomy to keep his hematocrit close to 45.   Interim History: [name] returns today for repeat evaluation.  Since the last visit, he reports no major changes in his health.  He continues to live independently without any recent issues or hospitalizations.  He denies any recent thrombosis or bleeding episodes.  He did have a cardiac catheterization back in July 2019 after presenting with exertional dyspnea.  He denies any urinary difficulties or signs of prostate issues.  He denies any hematochezia, melena or hemoptysis.   He does report occasional headaches but no blurry vision. He did not have any alteration in mental status or confusion.  He does not report any chest pain, palpitation, orthopnea. He does not report any cough, hemoptysis, wheezing or shortness of breath. He does not report any nausea, vomiting, abdominal pain.  He does not report any changes in his bowel habits. He does not report any frequency urgency or hesitancy. He does not report any arthralgias or myalgias.  He denies any skin rashes or lesions.  Rest of his review of systems is negative.  Medications: I have reviewed the patient's current medications.  Current Outpatient Medications  Medication Sig Dispense Refill  .  albuterol (PROVENTIL HFA;VENTOLIN HFA) 108 (90 Base) MCG/ACT inhaler Inhale 2 puffs into the lungs every 6 (six) hours as needed for wheezing or shortness of breath. 1 Inhaler 0  . allopurinol (ZYLOPRIM) 100 MG tablet TAKE TWO TABLETS BY MOUTH ONCE DAILY 60 tablet 5  . amLODipine (NORVASC) 10 MG tablet Take 1 tablet (10 mg total) by mouth daily. 90 tablet 3  . apixaban (ELIQUIS) 5 MG TABS tablet Take 1 tablet (5 mg total) by mouth 2 (two) times daily. 180 tablet 3  . atorvastatin (LIPITOR) 80 MG tablet Take 1 tablet (80 mg total) by mouth daily at 6 PM. 90 tablet 3  . Blood Glucose Monitoring Suppl (ONETOUCH VERIO) w/Device KIT 1 Device by Does not apply route 2 (two) times daily. E11.9 1 kit 2  . carvedilol (COREG) 25 MG tablet Take 1 tablet (25 mg total) by mouth 2 (two) times daily. 180 tablet 3  . clopidogrel (PLAVIX) 75 MG tablet Take 1 tablet (75 mg total) by mouth daily with breakfast. 34 tablet 5  . Dapagliflozin Propanediol (FARXIGA PO) Take 5 mg by mouth.    [name] glucose blood test strip Check BS bid DX: E11.9 100 each 12  . hydrALAZINE (APRESOLINE) 25 MG tablet Take 1 tablet (25 mg total) by mouth 2 (two) times daily. 180 tablet 3  . Insulin Glargine (LANTUS SOLOSTAR) 100 UNIT/ML Solostar Pen Inject 45 Units into the skin daily. 15 mL 3  . Insulin Syringe-Needle U-100 (SAFETY INSULIN SYRINGES) 30G X 5/16" 0.5 ML MISC As needed for insulin 100  each 11  . isosorbide mononitrate (IMDUR) 30 MG 24 hr tablet Take 1 tablet (30 mg total) by mouth daily. 30 tablet 11  . JANUVIA 100 MG tablet TAKE 1 TABLET(100 MG) BY MOUTH DAILY 90 tablet 3  . lisinopril (PRINIVIL,ZESTRIL) 40 MG tablet Take 40 mg by mouth daily.    . nitroGLYCERIN (NITROSTAT) 0.4 MG SL tablet Place 1 tablet (0.4 mg total) under the tongue every 5 (five) minutes as needed for chest pain. Do not take more than 3 nitroglycerine 25 tablet 3  . trimethoprim-polymyxin b (POLYTRIM) ophthalmic solution Place 2 drops into both eyes every 4  (four) hours. 10 mL 0   No current facility-administered medications for this visit.      Allergies:  Allergies  Allergen Reactions  . Betadine [Povidone Iodine] Other (See Comments)    Patient doesn't remember  . Contrast Media [Iodinated Diagnostic Agents] Other (See Comments)    Patient doesn't remember  . Fish Allergy Swelling    Past Medical History, Surgical history, Social history, and Family History were reviewed and updated.   Physical Exam: Blood pressure 117/68, pulse 78, temperature 98.3 F (36.8 C), temperature source Oral, resp. rate 18, height '5\' 10"'$  (1.778 m), weight 246 lb 6.4 oz (111.8 kg), SpO2 99 %.   ECOG: 0  General appearance: Comfortable appearing without any discomfort Head: Normocephalic without any trauma Oropharynx: Mucous membranes are moist and pink without any thrush or ulcers. Eyes: Pupils are equal and round reactive to light. Lymph nodes: No cervical, supraclavicular, inguinal or axillary lymphadenopathy.   Heart:regular rate and rhythm.  S1 and S2 without leg edema. Lung: Clear without any rhonchi or wheezes.  No dullness to percussion. Abdomin: Soft, nontender, nondistended with good bowel sounds.  No hepatosplenomegaly. Musculoskeletal: No joint deformity or effusion.  Full range of motion noted. Neurological: No deficits noted on motor, sensory and deep tendon reflex exam. Skin: No petechial rash or dryness.  Appeared moist.     Lab Results: Lab Results  Component Value Date   WBC 10.4 12/17/2017   HGB 16.0 12/17/2017   HCT 58.7 (H) 12/17/2017   MCV 76.4 (L) 12/17/2017   PLT 377 12/17/2017     Chemistry      Component Value Date/Time   NA 141 10/23/2017 1137   NA 143 10/07/2017 1200   NA 141 10/02/2015 0956   K 4.5 10/23/2017 1137   K 4.3 10/02/2015 0956   CL 110 10/23/2017 1137   CO2 23 10/23/2017 1137   CO2 24 10/02/2015 0956   BUN 22 10/23/2017 1137   BUN 22 10/07/2017 1200   BUN 27.1 (H) 10/02/2015 0956    CREATININE 1.74 (H) 10/23/2017 1137   CREATININE 1.60 (H) 07/06/2017 1434   CREATININE 1.6 (H) 10/02/2015 0956      Component Value Date/Time   CALCIUM 8.6 (L) 10/23/2017 1137   CALCIUM 9.6 10/02/2015 0956   ALKPHOS 57 09/11/2016 1550   ALKPHOS 83 10/02/2015 0956   AST 15 06/15/2017 1434   AST 11 10/02/2015 0956   ALT 9 06/15/2017 1434   ALT 13 10/02/2015 0956   BILITOT 0.6 06/15/2017 1434   BILITOT 0.84 10/02/2015 0956         Impression and Plan:  72 year old gentleman with the following issues:  1. Polycythemia vera diagnosed in 2015.  He has been receiving intermittent phlebotomy although he has not received it for the last 2 years.  The natural course of his disease was reviewed today as well  as laboratory testing in the last few months.  His hemoglobin and hematocrit has been increasing and he does have history of coronary disease as well as history of arterial thrombosis.  Risks and benefits of restarting phlebotomy was discussed today and he is agreeable to proceed.  We will start with a phlebotomy on every 3 months basis to get his hematocrit below 45.  He is agreeable to continue with this plan at this time.  2.  Thrombosis prophylaxis: He is currently on Eliquis from a cardiac indication.  His risk of recurrent thrombosis is lower at this time.  3. Prostate cancer: He is status post definitive therapy with radiation completed without incident.  He has no follow-up with urology established.  We will check a PSA to ensure stability of his disease.  4. Follow-up: Will be in for phlebotomy every 3 months and MD follow-up in 6 months.  15  minutes was spent with the patient face-to-face today.  More than 50% of time was dedicated to reviewing the natural course of his disease, reviewing laboratory testing and coordinating plan of care.   Zola Button, MD 10/24/20199:51 AM

## 2017-12-18 ENCOUNTER — Ambulatory Visit (HOSPITAL_COMMUNITY): Payer: Medicare HMO

## 2017-12-21 ENCOUNTER — Encounter: Payer: Self-pay | Admitting: Cardiology

## 2017-12-21 ENCOUNTER — Ambulatory Visit (HOSPITAL_COMMUNITY): Payer: Medicare HMO

## 2017-12-22 ENCOUNTER — Telehealth: Payer: Self-pay

## 2017-12-22 DIAGNOSIS — H2513 Age-related nuclear cataract, bilateral: Secondary | ICD-10-CM | POA: Diagnosis not present

## 2017-12-22 DIAGNOSIS — Z01818 Encounter for other preprocedural examination: Secondary | ICD-10-CM | POA: Diagnosis not present

## 2017-12-22 DIAGNOSIS — E113291 Type 2 diabetes mellitus with mild nonproliferative diabetic retinopathy without macular edema, right eye: Secondary | ICD-10-CM | POA: Diagnosis not present

## 2017-12-22 DIAGNOSIS — H2512 Age-related nuclear cataract, left eye: Secondary | ICD-10-CM | POA: Diagnosis not present

## 2017-12-22 NOTE — Telephone Encounter (Signed)
Okay for cataract surgery.  Would continue apixaban and Plavix. Kirk Ruths, MD

## 2017-12-22 NOTE — Telephone Encounter (Signed)
   [facility] Pre-operative Risk Assessment    Request for surgical clearance:  1. What type of surgery is being performed? Cataract Extraction   2. When is this surgery scheduled? 12/28/2017   3. What type of clearance is required (medical clearance vs. Pharmacy clearance to hold med vs. Both)? Medical Clearance  4. Are there any medications that need to be held prior to surgery and how long? States they will not need to stop any blood thinners for this surgery.   5. Practice name and name of physician performing surgery? [facility]   6. What is your office phone number [phone] ext [phone]    7.   What is your office fax number [phone]  8.   Anesthesia type (None, local, MAC, general) ? IV Sedation   Ena Dawley 12/22/2017, 1:23 PM  _________________________________________________________________   (provider comments below)

## 2017-12-22 NOTE — Telephone Encounter (Signed)
   Primary Cardiologist: Kirk Ruths, MD  Chart reviewed as part of pre-operative protocol coverage. Dr. Stanford Breed, can you cleared the patient? You have seen him 2-3 weeks ago. Please forward your response to [facility] PREOP.   Thank you   Leanor Kail, PA 12/22/2017, 3:07 PM

## 2017-12-23 ENCOUNTER — Ambulatory Visit (HOSPITAL_COMMUNITY): Payer: Medicare HMO

## 2017-12-23 ENCOUNTER — Inpatient Hospital Stay: Payer: Medicare HMO

## 2017-12-23 VITALS — BP 122/71 | HR 69 | Temp 98.0°F | Resp 17

## 2017-12-23 DIAGNOSIS — Z7901 Long term (current) use of anticoagulants: Secondary | ICD-10-CM | POA: Diagnosis not present

## 2017-12-23 DIAGNOSIS — D751 Secondary polycythemia: Secondary | ICD-10-CM | POA: Diagnosis not present

## 2017-12-23 DIAGNOSIS — Z79899 Other long term (current) drug therapy: Secondary | ICD-10-CM | POA: Diagnosis not present

## 2017-12-23 DIAGNOSIS — Z923 Personal history of irradiation: Secondary | ICD-10-CM | POA: Diagnosis not present

## 2017-12-23 DIAGNOSIS — Z8546 Personal history of malignant neoplasm of prostate: Secondary | ICD-10-CM | POA: Diagnosis not present

## 2017-12-23 DIAGNOSIS — I63412 Cerebral infarction due to embolism of left middle cerebral artery: Secondary | ICD-10-CM

## 2017-12-23 DIAGNOSIS — Z794 Long term (current) use of insulin: Secondary | ICD-10-CM | POA: Diagnosis not present

## 2017-12-23 NOTE — Progress Notes (Signed)
James Mcclain presents today for phlebotomy per MD orders. LAC accessed with 18G needle.  Phlebotomy procedure started at 10:19 and ended at 10:35. 520 grams removed.  IV catheter removed intact. Patient tolerated procedure well.  Food and drink provided and vitals stable. Patient observed for 30 minutes after procedure without any incident.   Vitals:   12/23/17 1010 12/23/17 1049  BP: 112/61 122/71  Pulse: 71 69  Resp: 17 17  Temp: 98.5 F (36.9 C) 98 F (36.7 C)  TempSrc: Oral Oral  SpO2: 100% 100%

## 2017-12-23 NOTE — Patient Instructions (Signed)

## 2017-12-25 ENCOUNTER — Ambulatory Visit (HOSPITAL_COMMUNITY): Payer: Medicare HMO

## 2017-12-25 DIAGNOSIS — I10 Essential (primary) hypertension: Secondary | ICD-10-CM | POA: Diagnosis not present

## 2017-12-25 DIAGNOSIS — E119 Type 2 diabetes mellitus without complications: Secondary | ICD-10-CM | POA: Diagnosis not present

## 2017-12-25 DIAGNOSIS — R200 Anesthesia of skin: Secondary | ICD-10-CM | POA: Diagnosis not present

## 2017-12-28 ENCOUNTER — Ambulatory Visit (HOSPITAL_COMMUNITY): Payer: Medicare HMO

## 2017-12-30 ENCOUNTER — Ambulatory Visit (HOSPITAL_COMMUNITY): Payer: Medicare HMO

## 2017-12-30 ENCOUNTER — Encounter: Payer: Medicare HMO | Admitting: *Deleted

## 2018-01-01 ENCOUNTER — Ambulatory Visit (HOSPITAL_COMMUNITY): Payer: Medicare HMO

## 2018-01-04 ENCOUNTER — Emergency Department (HOSPITAL_COMMUNITY)
Admission: EM | Admit: 2018-01-04 | Discharge: 2018-01-04 | Disposition: A | Discharge status: Home / Self Care | Payer: Medicare HMO | Attending: Emergency Medicine | Admitting: Emergency Medicine

## 2018-01-04 ENCOUNTER — Ambulatory Visit (HOSPITAL_COMMUNITY): Payer: Medicare HMO

## 2018-01-04 ENCOUNTER — Encounter (HOSPITAL_COMMUNITY): Payer: Self-pay

## 2018-01-04 ENCOUNTER — Emergency Department (HOSPITAL_COMMUNITY): Payer: Medicare HMO

## 2018-01-04 ENCOUNTER — Other Ambulatory Visit: Payer: Self-pay

## 2018-01-04 DIAGNOSIS — Z7902 Long term (current) use of antithrombotics/antiplatelets: Secondary | ICD-10-CM | POA: Diagnosis not present

## 2018-01-04 DIAGNOSIS — Z794 Long term (current) use of insulin: Secondary | ICD-10-CM | POA: Diagnosis not present

## 2018-01-04 DIAGNOSIS — R0789 Other chest pain: Secondary | ICD-10-CM | POA: Diagnosis not present

## 2018-01-04 DIAGNOSIS — R079 Chest pain, unspecified: Secondary | ICD-10-CM | POA: Diagnosis not present

## 2018-01-04 DIAGNOSIS — Z79899 Other long term (current) drug therapy: Secondary | ICD-10-CM | POA: Insufficient documentation

## 2018-01-04 DIAGNOSIS — N183 Chronic kidney disease, stage 3 (moderate): Secondary | ICD-10-CM | POA: Diagnosis not present

## 2018-01-04 DIAGNOSIS — I130 Hypertensive heart and chronic kidney disease with heart failure and stage 1 through stage 4 chronic kidney disease, or unspecified chronic kidney disease: Secondary | ICD-10-CM | POA: Insufficient documentation

## 2018-01-04 DIAGNOSIS — R42 Dizziness and giddiness: Secondary | ICD-10-CM | POA: Diagnosis not present

## 2018-01-04 DIAGNOSIS — E119 Type 2 diabetes mellitus without complications: Secondary | ICD-10-CM | POA: Diagnosis not present

## 2018-01-04 DIAGNOSIS — I509 Heart failure, unspecified: Secondary | ICD-10-CM | POA: Insufficient documentation

## 2018-01-04 DIAGNOSIS — I2510 Atherosclerotic heart disease of native coronary artery without angina pectoris: Secondary | ICD-10-CM | POA: Diagnosis not present

## 2018-01-04 DIAGNOSIS — Z7901 Long term (current) use of anticoagulants: Secondary | ICD-10-CM | POA: Insufficient documentation

## 2018-01-04 LAB — CBC WITH DIFFERENTIAL/PLATELET
Basophils Absolute: 0.2 10*3/uL — ABNORMAL HIGH (ref 0.0–0.1)
Basophils Relative: 2 %
Eosinophils Absolute: 0.4 10*3/uL (ref 0.0–0.5)
Eosinophils Relative: 4 %
HCT: 59.2 % — ABNORMAL HIGH (ref 39.0–52.0)
Hemoglobin: 15.5 g/dL (ref 13.0–17.0)
Lymphocytes Relative: 14 %
Lymphs Abs: 1.5 10*3/uL (ref 0.7–4.0)
MCH: 19.9 pg — ABNORMAL LOW (ref 26.0–34.0)
MCHC: 26.2 g/dL — ABNORMAL LOW (ref 30.0–36.0)
MCV: 75.9 fL — ABNORMAL LOW (ref 80.0–100.0)
Monocytes Absolute: 0.6 10*3/uL (ref 0.1–1.0)
Monocytes Relative: 6 %
Neutro Abs: 7.7 10*3/uL (ref 1.7–7.7)
Neutrophils Relative %: 74 %
Platelets: 340 10*3/uL (ref 150–400)
RBC: 7.8 MIL/uL — ABNORMAL HIGH (ref 4.22–5.81)
RDW: 23.7 % — ABNORMAL HIGH (ref 11.5–15.5)
WBC: 10.4 10*3/uL (ref 4.0–10.5)
nRBC: 0 /100 WBC
nRBC: 0.4 % — ABNORMAL HIGH (ref 0.0–0.2)

## 2018-01-04 LAB — BASIC METABOLIC PANEL
Anion gap: 8 (ref 5–15)
BUN: 25 mg/dL — ABNORMAL HIGH (ref 8–23)
CO2: 23 mmol/L (ref 22–32)
Calcium: 8.5 mg/dL — ABNORMAL LOW (ref 8.9–10.3)
Chloride: 108 mmol/L (ref 98–111)
Creatinine, Ser: 1.95 mg/dL — ABNORMAL HIGH (ref 0.61–1.24)
GFR calc Af Amer: 38 mL/min — ABNORMAL LOW (ref >60)
GFR calc non Af Amer: 33 mL/min — ABNORMAL LOW (ref >60)
Glucose, Bld: 144 mg/dL — ABNORMAL HIGH (ref 70–99)
Potassium: 4.4 mmol/L (ref 3.5–5.1)
Sodium: 139 mmol/L (ref 135–145)

## 2018-01-04 LAB — TROPONIN I: Troponin I: <0.03 ng/mL (ref <0.03)

## 2018-01-04 MED ORDER — NITROGLYCERIN 0.4 MG SL SUBL
0.4000 mg | SUBLINGUAL_TABLET | SUBLINGUAL | Status: DC | PRN
  Administered 2018-01-04: 0.4 mg via SUBLINGUAL
  Filled 2018-01-04: qty 1

## 2018-01-04 MED ORDER — SODIUM CHLORIDE 0.9 % IV BOLUS
1000.0000 mL | Freq: Once | INTRAVENOUS | Status: AC
  Administered 2018-01-04: 1000 mL via INTRAVENOUS

## 2018-01-04 MED ORDER — ASPIRIN 81 MG PO CHEW
324.0000 mg | CHEWABLE_TABLET | Freq: Once | ORAL | Status: AC
  Administered 2018-01-04: 324 mg via ORAL
  Filled 2018-01-04: qty 4

## 2018-01-04 NOTE — Discharge Instructions (Signed)
If your dizziness worsens or you develop recurrent chest pain, shortness of breath, vomiting, or any other new/concerning symptoms and return to the ER for evaluation.  Otherwise follow-up closely with your cardiologist.

## 2018-01-04 NOTE — ED Notes (Signed)
Cardiologist at the bedside

## 2018-01-04 NOTE — ED Triage Notes (Signed)
Pt from home; c/o L sided chest pressure that began yesterday, dizziness this morning that began around 5 pm yesterday; denies sob/n/v, no radiation of CP; pt sitting in church when chest pressure started; dizziness constant, nothing makes it worse or better; denies HA, blurred vision, or weakness; Hx stroke, MI

## 2018-01-04 NOTE — ED Notes (Signed)
Pt verbalized understanding of discharge instructions and denies any further questions at this time.   

## 2018-01-04 NOTE — ED Provider Notes (Signed)
[name] EMERGENCY DEPARTMENT Provider Note   CSN: [id number] Arrival date & time: 01/04/18  6962     History   Chief Complaint Chief Complaint  Patient presents with  . Chest Pain  . Dizziness    HPI James Mcclain is a 72 y.o. male.  HPI  72 year old male with a history of chronic kidney disease, CHF, coronary disease, diabetes and paroxysmal A. fib presents with chest pain.  He states that he developed some squeezing in his chest yesterday at church around 11 AM.  Lasted only 5 or 10 minutes.  He was feeling fine until yesterday evening when he developed recurrent discomfort in his chest but only about a 1 or 2 out of 10.  Shortly thereafter developed dizziness.  At first it seemed like room spinning but then was lightheadedness.  Chest discomfort and lightheadedness have persisted since.  There are no other symptoms including no shortness of breath, vomiting, headache, back or abdominal pain.  No weakness or numbness in his extremities.  He has not tried anything for the symptoms.  Dizziness is not worse with certain positions and his chest pain is not made worse by exertion or deep inspiration.  No recent travel or swelling in his legs. Has been compliant with all of his meds.  Past Medical History:  Diagnosis Date  . Arthritis    "right hand" (09/21/2017)  . CHF (congestive heart failure) ([place])   . CKD (chronic kidney disease) stage 3, GFR 30-59 ml/min (HCC)   . Coronary artery disease    a. s/p Xience DES to the LAD 07/2012. b. 09/21/17 Synergy stent to RI.   . Dilated aortic root ([place])    a. Mildly dilated by echo 09/2013, f/u MRA scheduled for 12/2014.  [name] Elevated PSA   . Essential hypertension   . Gout    "on daily RX" (09/21/2017)  . Heart murmur   . History of kidney stones   . Hyperlipidemia   . Insulin dependent diabetes mellitus ([place])   . 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 On tele 09/2013.  [name] Paroxysmal atrial fibrillation (HCC)   . PFO  (patent foramen ovale)    a. By echo 09/2013.  [name] Polycythemia   . Prostate cancer ([place]) 07/15/12   Gleason 7, external beam radiation Tx, Dr. Pilar Jarvis  . PVC's (premature ventricular contractions)   . Stroke [facility])    a. Cryptogenic, 09/2013, event monitor with NSR. Small PFO noted on echo but neg LE duplex.    Patient Active Problem List   Diagnosis Date Noted  . Atypical chest pain   . Dizziness   . New-onset angina ([place]) 09/21/2017  . History of snoring 08/05/2017  . Neoplastic malignant related fatigue 08/05/2017  . 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 CKD (chronic kidney disease) stage 3, GFR 30-59 ml/min (HCC)   . Paroxysmal atrial fibrillation ([place]) 07/30/2015  . PFO (patent foramen ovale)   . Dilated aortic root ([place])   . PVC's (premature ventricular contractions)   . Insulin dependent diabetes mellitus ([place])   . Stroke ([place])   . Coronary artery disease   . Multifocal atrial tachycardia (HCC)   . Polycythemia 05/16/2014  . History of CVA (cerebrovascular accident) 01/02/2014  . 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 Essential hypertension 11/28/2013  . Hyperlipidemia 11/28/2013  . Cerebral infarction due to embolism of left middle cerebral artery ([place]) 11/28/2013  . CAD (coronary artery disease)  12/17/2012  . Prostate cancer ([place])   . 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 SOB (shortness of breath) 06/23/2012  . Murmur 06/23/2012  . DM2 (diabetes mellitus, type 2) ([place])   . Elevated PSA     Past Surgical History:  Procedure Laterality Date  . COLONOSCOPY WITH PROPOFOL N/A 02/22/2016   Procedure: COLONOSCOPY WITH PROPOFOL;  Surgeon: Carol Ada, MD;  Location: [facility];  Service: Endoscopy;  Laterality: N/A;  . CORONARY ANGIOPLASTY WITH STENT PLACEMENT  08/05/2012   LAD, 1 stent  . CORONARY STENT INTERVENTION N/A 09/21/2017   Procedure: CORONARY STENT INTERVENTION;  Surgeon: Belva Crome,  MD;  Location: [facility] CV LAB;  Service: Cardiovascular;  Laterality: N/A;  . EP IMPLANTABLE DEVICE N/A 08/23/2014   Procedure: Loop Recorder Insertion;  Surgeon: Evans Lance, MD;  Location: [facility] CV LAB;  Service: Cardiovascular;  Laterality: N/A;  . INSERTION PROSTATE RADIATION SEED    . JOINT REPLACEMENT    . LEFT HEART CATH AND CORONARY ANGIOGRAPHY N/A 09/21/2017   Procedure: LEFT HEART CATH AND CORONARY ANGIOGRAPHY;  Surgeon: Belva Crome, MD;  Location: [facility] CV LAB;  Service: Cardiovascular;  Laterality: N/A;  . LITHOTRIPSY  2006  . PERCUTANEOUS CORONARY STENT INTERVENTION (PCI-S) N/A 08/05/2012   Procedure: PERCUTANEOUS CORONARY STENT INTERVENTION (PCI-S);  Surgeon: Sherren Mocha, MD;  Location: [facility] CATH LAB;  Service: Cardiovascular;  Laterality: N/A;  . PROSTATE BIOPSY  07/15/2012; 09/23/2013  . TEE WITHOUT CARDIOVERSION N/A 09/27/2013   Procedure: TRANSESOPHAGEAL ECHOCARDIOGRAM (TEE);  Surgeon: Larey Dresser, MD;  Location: [facility];  Service: Cardiovascular;  Laterality: N/A;  . TONSILLECTOMY  1990s  . TOTAL KNEE ARTHROPLASTY  01/24/2011   Procedure: TOTAL KNEE ARTHROPLASTY;  Surgeon: Alta Corning;  Location: [facility];  Service: Orthopedics;  Laterality: Left;  COMPUTER ASSISTED LEFT  TOTAL KNEE REPLACEMENT. Anesthesia a combination of regional and general.        Home Medications    Prior to Admission medications   Medication Sig Start Date End Date Taking? Authorizing Provider  albuterol (PROVENTIL HFA;VENTOLIN HFA) 108 (90 Base) MCG/ACT inhaler Inhale 2 puffs into the lungs every 6 (six) hours as needed for wheezing or shortness of breath. 08/28/17   Susy Frizzle, MD  allopurinol (ZYLOPRIM) 100 MG tablet TAKE TWO TABLETS BY MOUTH ONCE DAILY 02/26/17   Susy Frizzle, MD  amLODipine (NORVASC) 10 MG tablet Take 1 tablet (10 mg total) by mouth daily. 09/19/16   Susy Frizzle, MD  apixaban (ELIQUIS) 5 MG TABS tablet Take 1 tablet (5 mg total) by mouth  2 (two) times daily. 01/19/17   Susy Frizzle, MD  atorvastatin (LIPITOR) 80 MG tablet Take 1 tablet (80 mg total) by mouth daily at 6 PM. 09/22/17   Daune Perch, NP  Blood Glucose Monitoring Suppl (ONETOUCH VERIO) w/Device KIT 1 Device by Does not apply route 2 (two) times daily. E11.9 08/28/16   Susy Frizzle, MD  carvedilol (COREG) 25 MG tablet Take 1 tablet (25 mg total) by mouth 2 (two) times daily. 09/22/17 09/22/18  Daune Perch, NP  clopidogrel (PLAVIX) 75 MG tablet Take 1 tablet (75 mg total) by mouth daily with breakfast. 11/12/17   Lelon Perla, MD  Dapagliflozin Propanediol (FARXIGA PO) Take 5 mg by mouth.    [provider]  glucose blood test strip Check BS bid DX: E11.9 08/28/16   Susy Frizzle, MD  hydrALAZINE (APRESOLINE) 25 MG tablet  Take 1 tablet (25 mg total) by mouth 2 (two) times daily. 10/07/17 01/05/18  Almyra Deforest, PA  Insulin Glargine (LANTUS SOLOSTAR) 100 UNIT/ML Solostar Pen Inject 45 Units into the skin daily. 08/18/16   Susy Frizzle, MD  Insulin Syringe-Needle U-100 (SAFETY INSULIN SYRINGES) 30G X 5/16" 0.5 ML MISC As needed for insulin 06/11/17   Susy Frizzle, MD  isosorbide mononitrate (IMDUR) 30 MG 24 hr tablet Take 1 tablet (30 mg total) by mouth daily. 10/23/17 10/23/18  Almyra Deforest, PA  JANUVIA 100 MG tablet TAKE 1 TABLET(100 MG) BY MOUTH DAILY 10/05/17   Susy Frizzle, MD  lisinopril (PRINIVIL,ZESTRIL) 40 MG tablet Take 40 mg by mouth daily.    [provider]  nitroGLYCERIN (NITROSTAT) 0.4 MG SL tablet Place 1 tablet (0.4 mg total) under the tongue every 5 (five) minutes as needed for chest pain. Do not take more than 3 nitroglycerine 10/23/17 10/23/18  Almyra Deforest, PA  trimethoprim-polymyxin b (POLYTRIM) ophthalmic solution Place 2 drops into both eyes every 4 (four) hours. 04/21/17   Susy Frizzle, MD    Family History Family History  Problem Relation Age of Onset  . Leukemia Mother   . Cancer Mother   . Heart Problems  Father   . Diabetes Sister        ? father  . Heart Problems Brother   . Heart Problems Unknown        family history  . Stroke Unknown   . Stroke Brother   . Hypertension Brother   . Cervical cancer Sister        ????  . Heart attack Neg Hx     Social History Social History   Tobacco Use  . Smoking status: Never Smoker  . Smokeless tobacco: Never Used  Substance Use Topics  . Alcohol use: Not Currently  . Drug use: Never     Allergies   Betadine [povidone iodine]; Contrast media [iodinated diagnostic agents]; and Fish allergy   Review of Systems Review of Systems  Respiratory: Negative for shortness of breath.   Cardiovascular: Positive for chest pain.  Gastrointestinal: Negative for abdominal pain, nausea and vomiting.  Musculoskeletal: Negative for back pain.  Neurological: Positive for dizziness and light-headedness. Negative for weakness, numbness and headaches.  All other systems reviewed and are negative.    Physical Exam Updated Vital Signs BP 127/74   Pulse 64   Temp 98.6 F (37 C) (Oral)   Resp 16   SpO2 99%   Physical Exam  Constitutional: He appears well-developed and well-nourished.  Non-toxic appearance. He does not appear ill.  HENT:  Head: Normocephalic and atraumatic.  Right Ear: External ear normal.  Left Ear: External ear normal.  Nose: Nose normal.  Eyes: Right eye exhibits no discharge. Left eye exhibits no discharge.  Neck: Neck supple.  Cardiovascular: Normal rate, regular rhythm and normal heart sounds.  Pulses:      Radial pulses are 2+ on the right side, and 2+ on the left side.  Pulmonary/Chest: Effort normal and breath sounds normal. He exhibits no tenderness.  Abdominal: Soft. There is no tenderness.  Musculoskeletal: He exhibits no edema.       Right lower leg: He exhibits no edema.       Left lower leg: He exhibits no edema.  Neurological: He is alert.  CN 3-12 grossly intact. 5/5 strength in all 4 extremities. Grossly  normal sensation. Normal finger to nose.   Skin: Skin is warm and  dry.  Psychiatric: His mood appears not anxious.  Nursing note and vitals reviewed.    ED Treatments / Results  Labs (all labs ordered are listed, but only abnormal results are displayed) Labs Reviewed  BASIC METABOLIC PANEL - Abnormal; Notable for the following components:      Result Value   Glucose, Bld 144 (*)    BUN 25 (*)    Creatinine, Ser 1.95 (*)    Calcium 8.5 (*)    GFR calc non Af Amer 33 (*)    GFR calc Af Amer 38 (*)    All other components within normal limits  CBC WITH DIFFERENTIAL/PLATELET - Abnormal; Notable for the following components:   RBC 7.80 (*)    HCT 59.2 (*)    MCV 75.9 (*)    MCH 19.9 (*)    MCHC 26.2 (*)    RDW 23.7 (*)    nRBC 0.4 (*)    Basophils Absolute 0.2 (*)    All other components within normal limits  TROPONIN I    EKG EKG Interpretation  Date/Time:  Monday January 04 2018 09:38:29 EST Ventricular Rate:  74 PR Interval:    QRS Duration: 141 QT Interval:  449 QTC Calculation: 499 R Axis:   -59 Text Interpretation:  Sinus rhythm Probable left atrial enlargement RBBB and LAFB Inferior infarct, old no significant change since Oct 23 2017 Confirmed by Sherwood Gambler [phone]) on 01/04/2018 9:51:25 AM Also confirmed by Sherwood Gambler [phone]), editor Lynder Parents [phone])  on 01/04/2018 2:06:35 PM   Radiology Dg Chest 2 View  Result Date: 01/04/2018 CLINICAL DATA:  Chest pain for 2 days, initial encounter EXAM: CHEST - 2 VIEW COMPARISON:  10/23/2017 FINDINGS: Cardiac shadow is mildly enlarged but stable. Loop recorder is again seen. The lungs are well aerated bilaterally. No focal infiltrate or sizable effusion is seen. IMPRESSION: No acute abnormality noted. Electronically Signed   By: Inez Catalina M.D.   On: 01/04/2018 10:18    Procedures Procedures (including critical care time)  Medications Ordered in ED Medications  nitroGLYCERIN (NITROSTAT) SL tablet  0.4 mg (0.4 mg Sublingual Given 01/04/18 0954)  sodium chloride 0.9 % bolus 1,000 mL (0 mLs Intravenous Stopped 01/04/18 1101)  aspirin chewable tablet 324 mg (324 mg Oral Given 01/04/18 0954)     Initial Impression / Assessment and Plan / ED Course  I have reviewed the triage vital signs and the nursing notes.  Pertinent labs & imaging results that were available during my care of the patient were reviewed by me and considered in my medical decision making (see chart for details).     Patient's chest pain resolved with aspirin/nitroglycerin.  His main pain/severe pain was yesterday.  I consulted cardiology given his coronary history and they have evaluated patient.  Feel he stable for discharge home as this is unlikely to be ACS.  Troponin is negative and other labs are near baseline.  He is feeling fine at this time and wants to go home.  I doubt PE or dissection.  He appears stable for discharge home with return precautions and close cardiology follow-up.  Final Clinical Impressions(s) / ED Diagnoses   Final diagnoses:  Atypical chest pain  Dizziness    ED Discharge Orders    None       Sherwood Gambler, MD 01/04/18 [phone]

## 2018-01-04 NOTE — Consult Note (Signed)
Consult    Patient ID: James Mcclain MRN: [id number]; DOB: 18-May-1945  Admit date: 01/04/2018 Date of Consult: 01/04/2018  Primary Care Provider: [name], [place] Primary Cardiologist: Kirk Ruths, MD  Primary Electrophysiologist:  None    Patient Profile:   James Mcclain is a 72 y.o. 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  On last OV 11/30/17 pt was stable.  He was maintaining SR on Eliquis and for stent on plavix.  Also with hx of polycythemia prostate cancer.  On last LINQ interrogation no new a fib, sleep study + with mild OSA. Was in a fib with the study on CPAP.  Last nuc 08/24/17 with decrease EF.  Echo at that time wit hEF 50-55%, trivial AR.  Cath followed these tests with stent to RI.   Pt presented to ER today with L sided chest pressure that occurred  Yesterday and lasted 5 min, then some last evening.  Today no further chest discomfort but + dizziness and heart racing.  No dizziness yesterday dizziness described as room spinning.   Chest pressure began while sitting in church.  The dizziness was constant. In ER he has had 1 SL NTG and 324 mg ASA.  No further dizziness and never had chest pressure today.  Currently feels well. Prior to yesterday no symptoms at all of anything.  No fevers, mild cold.   EKG SR RBBB and LAFB, I personally reviewed.  Troponin<0.03 Na 139, K+ 4.4, BUN 25, Cr 1.95, Ca+ 8.5 HGB 15.5, WBC 10.4, plts 340.  CXR  No acute abnormality noted.   Past Medical History:  Diagnosis Date  . Arthritis    "right hand" (09/21/2017)  . CHF (congestive heart failure) ([place])   . CKD (chronic kidney disease) stage 3, GFR  30-59 ml/min (HCC)   . Coronary artery disease    a. s/p Xience DES to the LAD 07/2012. b. 09/21/17 Synergy stent to RI.   . Dilated aortic root ([place])    a. Mildly dilated by echo 09/2013, f/u MRA scheduled for 12/2014.  [name] Elevated PSA   . Essential hypertension   . Gout    "on daily RX" (09/21/2017)  . Heart murmur   . History of kidney stones   . Hyperlipidemia   . Insulin dependent diabetes mellitus ([place])   . 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 On tele 09/2013.  [name] Paroxysmal atrial fibrillation (HCC)   . PFO (patent foramen ovale)    a. By echo 09/2013.  [name] Polycythemia   . Prostate cancer ([place]) 07/15/12   Gleason 7, external beam radiation Tx, Dr. Pilar Jarvis  . PVC's (premature ventricular contractions)   . Stroke [facility])    a. Cryptogenic, 09/2013, event monitor with NSR. Small PFO noted on echo but neg LE duplex.    Past Surgical History:  Procedure Laterality Date  . COLONOSCOPY WITH PROPOFOL N/A 02/22/2016   Procedure: COLONOSCOPY WITH PROPOFOL;  Surgeon: Carol Ada, MD;  [facility];  Service: Endoscopy;  Laterality: N/A;  . CORONARY ANGIOPLASTY WITH STENT PLACEMENT  08/05/2012   LAD,  1 stent  . CORONARY STENT INTERVENTION N/A 09/21/2017   Procedure: CORONARY STENT INTERVENTION;  Surgeon: Belva Crome, MD;  Location: [place] CV LAB;  Service: Cardiovascular;  Laterality: N/A;  . EP IMPLANTABLE DEVICE N/A 08/23/2014   Procedure: Loop Recorder Insertion;  Surgeon: Evans Lance, MD;  Location: [place] CV LAB;  Service: Cardiovascular;  Laterality: N/A;  . INSERTION PROSTATE RADIATION SEED    . JOINT REPLACEMENT    . LEFT HEART CATH AND CORONARY ANGIOGRAPHY N/A 09/21/2017   Procedure: LEFT HEART CATH AND CORONARY ANGIOGRAPHY;  Surgeon: Belva Crome, MD;  Location: [place] CV LAB;  Service: Cardiovascular;  Laterality: N/A;  . LITHOTRIPSY  2006  . PERCUTANEOUS CORONARY STENT INTERVENTION (PCI-S) N/A 08/05/2012   Procedure: PERCUTANEOUS CORONARY STENT  INTERVENTION (PCI-S);  Surgeon: Sherren Mocha, MD;  Location: [facility] CATH LAB;  Service: Cardiovascular;  Laterality: N/A;  . PROSTATE BIOPSY  07/15/2012; 09/23/2013  . TEE WITHOUT CARDIOVERSION N/A 09/27/2013   Procedure: TRANSESOPHAGEAL ECHOCARDIOGRAM (TEE);  Surgeon: Larey Dresser, MD;  Location: [place];  Service: Cardiovascular;  Laterality: N/A;  . TONSILLECTOMY  1990s  . TOTAL KNEE ARTHROPLASTY  01/24/2011   Procedure: TOTAL KNEE ARTHROPLASTY;  Surgeon: Alta Corning;  Location: [place];  Service: Orthopedics;  Laterality: Left;  COMPUTER ASSISTED LEFT  TOTAL KNEE REPLACEMENT. Anesthesia a combination of regional and general.     Home Medications:  Prior to Admission medications   Medication Sig Start Date End Date Taking? Authorizing Provider  albuterol (PROVENTIL HFA;VENTOLIN HFA) 108 (90 Base) MCG/ACT inhaler Inhale 2 puffs into the lungs every 6 (six) hours as needed for wheezing or shortness of breath. 08/28/17   Susy Frizzle, MD  allopurinol (ZYLOPRIM) 100 MG tablet TAKE TWO TABLETS BY MOUTH ONCE DAILY 02/26/17   Susy Frizzle, MD  amLODipine (NORVASC) 10 MG tablet Take 1 tablet (10 mg total) by mouth daily. 09/19/16   Susy Frizzle, MD  apixaban (ELIQUIS) 5 MG TABS tablet Take 1 tablet (5 mg total) by mouth 2 (two) times daily. 01/19/17   Susy Frizzle, MD  atorvastatin (LIPITOR) 80 MG tablet Take 1 tablet (80 mg total) by mouth daily at 6 PM. 09/22/17   Daune Perch, NP  Blood Glucose Monitoring Suppl (ONETOUCH VERIO) w/Device KIT 1 Device by Does not apply route 2 (two) times daily. E11.9 08/28/16   Susy Frizzle, MD  carvedilol (COREG) 25 MG tablet Take 1 tablet (25 mg total) by mouth 2 (two) times daily. 09/22/17 09/22/18  Daune Perch, NP  clopidogrel (PLAVIX) 75 MG tablet Take 1 tablet (75 mg total) by mouth daily with breakfast. 11/12/17   Lelon Perla, MD  Dapagliflozin Propanediol (FARXIGA PO) Take 5 mg by mouth.    [provider]  glucose  blood test strip Check BS bid DX: E11.9 08/28/16   Susy Frizzle, MD  hydrALAZINE (APRESOLINE) 25 MG tablet Take 1 tablet (25 mg total) by mouth 2 (two) times daily. 10/07/17 01/05/18  Almyra Deforest, PA  Insulin Glargine (LANTUS SOLOSTAR) 100 UNIT/ML Solostar Pen Inject 45 Units into the skin daily. 08/18/16   Susy Frizzle, MD  Insulin Syringe-Needle U-100 (SAFETY INSULIN SYRINGES) 30G X 5/16" 0.5 ML MISC As needed for insulin 06/11/17   Susy Frizzle, MD  isosorbide mononitrate (IMDUR) 30 MG 24 hr tablet Take 1 tablet (30 mg total) by mouth daily. 10/23/17 10/23/18  Almyra Deforest, PA  JANUVIA 100 MG tablet TAKE 1 TABLET(100  MG) BY MOUTH DAILY 10/05/17   Susy Frizzle, MD  lisinopril (PRINIVIL,ZESTRIL) 40 MG tablet Take 40 mg by mouth daily.    [provider]  nitroGLYCERIN (NITROSTAT) 0.4 MG SL tablet Place 1 tablet (0.4 mg total) under the tongue every 5 (five) minutes as needed for chest pain. Do not take more than 3 nitroglycerine 10/23/17 10/23/18  Almyra Deforest, PA  trimethoprim-polymyxin b (POLYTRIM) ophthalmic solution Place 2 drops into both eyes every 4 (four) hours. 04/21/17   Susy Frizzle, MD    Inpatient Medications: Scheduled Meds:  Continuous Infusions:  PRN Meds: nitroGLYCERIN  Allergies:    Allergies  Allergen Reactions  . Betadine [Povidone Iodine] Other (See Comments)    Patient doesn't remember  . Contrast Media [Iodinated Diagnostic Agents] Other (See Comments)    Patient doesn't remember  . Fish Allergy Swelling    Social History:   Social History   Socioeconomic History  . Marital status: Widowed    Spouse name: Not on file  . Number of children: 3  . Years of education: 12th  . Highest education level: Not on file  Occupational History  . Occupation: retired  Scientific laboratory technician  . Financial resource strain: Not on file  . Food insecurity:    Worry: Not on file    Inability: Not on file  . Transportation needs:    Medical: Not on file     Non-medical: Not on file  Tobacco Use  . Smoking status: Never Smoker  . Smokeless tobacco: Never Used  Substance and Sexual Activity  . Alcohol use: Not Currently  . Drug use: Never  . Sexual activity: Not Currently  Lifestyle  . Physical activity:    Days per week: Not on file    Minutes per session: Not on file  . Stress: Not on file  Relationships  . Social connections:    Talks on phone: Not on file    Gets together: Not on file    Attends religious service: Not on file    Active member of club or organization: Not on file    Attends meetings of clubs or organizations: Not on file    Relationship status: Not on file  . Intimate partner violence:    Fear of current or ex partner: Not on file    Emotionally abused: Not on file    Physically abused: Not on file    Forced sexual activity: Not on file  Other Topics Concern  . Not on file  Social History Narrative   [name]   Patient is right handed   Patient drinks coffee and sodas    Family History:    Family History  Problem Relation Age of Onset  . Leukemia Mother   . Cancer Mother   . Heart Problems Father   . Diabetes Sister        ? father  . Heart Problems Brother   . Heart Problems Unknown        family history  . Stroke Unknown   . Stroke Brother   . Hypertension Brother   . Cervical cancer Sister        ????  . Heart attack Neg Hx      ROS:  Please see the history of present illness.  General:mild cold  No fevers, no weight changes Skin:no rashes or ulcers HEENT:no blurred vision, no congestion CV:see HPI PUL:see HPI GI:no diarrhea constipation or melena, no indigestion GU:no  hematuria, no dysuria MS:no joint pain, no claudication Neuro:no syncope, no lightheadedness Endo:+ diabetes insulin dependent, no thyroid disease  All other ROS reviewed and negative.     Physical Exam/Data:   Vitals:   01/04/18 1045 01/04/18 1115 01/04/18 1145 01/04/18 1200  BP:  128/76 127/71 125/82 127/74  Pulse: 66 (!) 59 63 64  Resp: '14 14 18 16  '$ Temp:      TempSrc:      SpO2: 98% 97% 99% 99%    Intake/Output Summary (Last 24 hours) at 01/04/2018 1236 Last data filed at 01/04/2018 1101 Gross per 24 hour  Intake 1000 ml  Output -  Net 1000 ml   There were no vitals filed for this visit. There is no height or weight on file to calculate BMI.  General:  Well nourished, well developed, in no acute distress HEENT: normal Lymph: no adenopathy Neck: no JVD Endocrine:  No thryomegaly Vascular: No carotid bruits; pedal pulses 2+ bilaterally  Cardiac:  normal S1, S2; RRR; no murmur Lungs:  clear to auscultation bilaterally, no wheezing, rhonchi or rales  Abd: soft, nontender, no hepatomegaly  Ext: no edema Musculoskeletal:  No deformities, BUE and BLE strength normal and equal Skin: warm and dry  Neuro:  CNs 2-12 intact, no focal abnormalities noted Psych:  Normal affect   Relevant CV Studies: Cardiac cath 09/21/17  Codominant coronary anatomy  Left main is without significant obstruction.  The left main terminates in the trifurcation with the circumflex, ramus, and LAD.  LAD contains a segmental proximal to mid vessel stent that is widely patent with perhaps 35% ISR.  Circumflex is a dominant vessel giving origin to 4 obtuse marginal branches.  Irregularity with up to 50% stenosis in the proximal to mid circumflex in the AV groove is noted.  Ramus intermedius is sizable and contains ostial to proximal 50% calcified narrowing followed by a relatively focal 85% stenosis in the proximal segment.  RCA is codominant with the circumflex and contains irregularities up to 50% of the proximal to mid and distal vessel.  Successful PTCA and stent of the proximal segment of the ramus intermedius reducing an 85% stenosis with TIMI grade III flow  to less than 10% with TIMI grade III flow.  A Synergy 2.5 x 16 mm stent was used and postdilated to 3.0 mm in  diameter.  RECOMMENDATIONS:   Aspirin, apixaban, and clopidogrel for 2 weeks then drop aspirin.  Clopidogrel and apixaban for total of 6 months.  Risk factor modification as determined by guidelines.  Recommend to resume Apixaban, at currently prescribed dose and frequency, on 09/21/2017 at 10 PM.  Recommend concurrent antiplatelet therapy of Aspirin '81mg'$  daily for 2 weeks.and Plavix for 6 months.  Post-Intervention Diagram      Echo 08/24/17 Study Conclusions  - Left ventricle: Patient in rapid afib during exam The cavity size   was mildly dilated. Wall thickness was normal. Systolic function   was normal. The estimated ejection fraction was in the range of   50% to 55%. Left ventricular diastolic function parameters were   normal. - Aortic valve: There was trivial regurgitation. - Mitral valve: Calcified annulus. Mildly thickened leaflets . - Left atrium: The atrium was mildly dilated. - Atrial septum: No defect or patent foramen ovale was identified.  Laboratory Data:  Chemistry Recent Labs  Lab 01/04/18 0943  NA 139  K 4.4  CL 108  CO2 23  GLUCOSE 144*  BUN 25*  CREATININE 1.95*  CALCIUM 8.5*  GFRNONAA 33*  GFRAA 38*  ANIONGAP 8    No results for input(s): PROT, ALBUMIN, AST, ALT, ALKPHOS, BILITOT in the last 168 hours. Hematology Recent Labs  Lab 01/04/18 0943  WBC 10.4  RBC 7.80*  HGB 15.5  HCT 59.2*  MCV 75.9*  MCH 19.9*  MCHC 26.2*  RDW 23.7*  PLT 340   Cardiac Enzymes Recent Labs  Lab 01/04/18 0943  TROPONINI <0.03   No results for input(s): TROPIPOC in the last 168 hours.  BNPNo results for input(s): BNP, PROBNP in the last 168 hours.  DDimer No results for input(s): DDIMER in the last 168 hours.  Radiology/Studies:  Dg Chest 2 View  Result Date: 01/04/2018 CLINICAL DATA:  Chest pain for 2 days, initial encounter EXAM: CHEST - 2 VIEW COMPARISON:  10/23/2017 FINDINGS: Cardiac shadow is mildly enlarged but stable. Loop recorder is  again seen. The lungs are well aerated bilaterally. No focal infiltrate or sizable effusion is seen. IMPRESSION: No acute abnormality noted. Electronically Signed   By: Inez Catalina M.D.   On: 01/04/2018 10:18    Assessment and Plan:   1. Chest pain described as pressure yesterday only, today was dizzy, now after the NTG and ASA dizziness resolved.  Troponin neg.  Had not had any chest discomfort until yesterday.  Dr. [place] to see.   2. Dizziness- lightheaded, now resolved, hx of PAF heart was racing this AM  3. CAD with recent PCI in July with stent to RI on plavix and Eliquis- prior stent to LAD in 2014 now 35% stenosed on last cath. 4. PAF on Eliquis and BB 5. TAA stable 6. HLD on statin 7. Hx CVA -a fib documented - on Eliquis 8. Chronic stage 3 kidney disease.       For questions or updates, please contact [place] Please consult [email or web address] for contact info under     Signed, Cecilie Kicks, NP  01/04/2018 12:36 PM

## 2018-01-04 NOTE — ED Notes (Signed)
Got patient undress on the monitor did ekg shown to Dr Goldston patient is resting with call bell in reach °

## 2018-01-04 NOTE — ED Notes (Signed)
ED Provider at bedside. 

## 2018-01-04 NOTE — ED Notes (Signed)
Cardiology at bedside for consultation

## 2018-01-05 ENCOUNTER — Ambulatory Visit: Payer: Medicare HMO | Admitting: Cardiology

## 2018-01-05 NOTE — Progress Notes (Signed)
HPI: FU PAF and CAD. Patient previously followed by Dr. Aundra Dubin. He had a cryptogenic stroke in 2015 treated with tPAand afuloop monitor showed paroxysmal atrial fibrillation. He has been anticoagulated for that. Patient had a cardiac catheterization in June 2014 which showed a 90% proximal LAD and he had PCI at that time. Also with PFO documented by prior TEE. Last echo 7/19 showed normal LV function, trace AI, mild LAE. Cath 7/19 showed 35 LAD, 50 Lcx, 50/85 RI and 50 RCA. Had PCI of RI with DES. CTA 7/19 showed TAA 4.5 cm. Seen with dyspnea 8/19 and imdur added.   Patient seen with atypical chest pain November 2019.  Enzymes negative.  Patient was treated medically.  If recurrent symptoms catheterization felt indicated.  Since last seen,the patient has dyspnea with more extreme activities but not with routine activities. It is relieved with rest. It is not associated with chest pain. There is no orthopnea, PND or pedal edema. There is no syncope or palpitations. There is no exertional chest pain.   Current Outpatient Medications  Medication Sig Dispense Refill  . albuterol (PROVENTIL HFA;VENTOLIN HFA) 108 (90 Base) MCG/ACT inhaler Inhale 2 puffs into the lungs every 6 (six) hours as needed for wheezing or shortness of breath. 1 Inhaler 0  . allopurinol (ZYLOPRIM) 100 MG tablet TAKE TWO TABLETS BY MOUTH ONCE DAILY 60 tablet 5  . amLODipine (NORVASC) 10 MG tablet Take 1 tablet (10 mg total) by mouth daily. 90 tablet 3  . apixaban (ELIQUIS) 5 MG TABS tablet Take 1 tablet (5 mg total) by mouth 2 (two) times daily. 180 tablet 3  . atorvastatin (LIPITOR) 80 MG tablet Take 1 tablet (80 mg total) by mouth daily at 6 PM. 90 tablet 3  . Blood Glucose Monitoring Suppl (ONETOUCH VERIO) w/Device KIT 1 Device by Does not apply route 2 (two) times daily. E11.9 1 kit 2  . carvedilol (COREG) 25 MG tablet Take 1 tablet (25 mg total) by mouth 2 (two) times daily. 180 tablet 3  . clopidogrel (PLAVIX) 75  MG tablet Take 1 tablet (75 mg total) by mouth daily with breakfast. 34 tablet 5  . Dapagliflozin Propanediol (FARXIGA PO) Take 5 mg by mouth.    [name] glucose blood test strip Check BS bid DX: E11.9 100 each 12  . Insulin Glargine (LANTUS SOLOSTAR) 100 UNIT/ML Solostar Pen Inject 45 Units into the skin daily. 15 mL 3  . Insulin Syringe-Needle U-100 (SAFETY INSULIN SYRINGES) 30G X 5/16" 0.5 ML MISC As needed for insulin 100 each 11  . isosorbide mononitrate (IMDUR) 30 MG 24 hr tablet Take 1 tablet (30 mg total) by mouth daily. 30 tablet 11  . JANUVIA 100 MG tablet TAKE 1 TABLET(100 MG) BY MOUTH DAILY 90 tablet 3  . lisinopril (PRINIVIL,ZESTRIL) 40 MG tablet Take 40 mg by mouth daily.    . nitroGLYCERIN (NITROSTAT) 0.4 MG SL tablet Place 1 tablet (0.4 mg total) under the tongue every 5 (five) minutes as needed for chest pain. Do not take more than 3 nitroglycerine 25 tablet 3  . trimethoprim-polymyxin b (POLYTRIM) ophthalmic solution Place 2 drops into both eyes every 4 (four) hours. 10 mL 0  . hydrALAZINE (APRESOLINE) 25 MG tablet Take 1 tablet (25 mg total) by mouth 2 (two) times daily. 180 tablet 3   No current facility-administered medications for this visit.      Past Medical History:  Diagnosis Date  . Arthritis    "right hand" (09/21/2017)  .  CHF (congestive heart failure) ([place])   . CKD (chronic kidney disease) stage 3, GFR 30-59 ml/min (HCC)   . Coronary artery disease    a. s/p Xience DES to the LAD 07/2012. b. 09/21/17 Synergy stent to RI.   . Dilated aortic root ([place])    a. Mildly dilated by echo 09/2013, f/u MRA scheduled for 12/2014.  [name] Elevated PSA   . Essential hypertension   . Gout    "on daily RX" (09/21/2017)  . Heart murmur   . History of kidney stones   . Hyperlipidemia   . Insulin dependent diabetes mellitus ([place])   . 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 On tele 09/2013.  [name] Paroxysmal atrial fibrillation (HCC)   . PFO (patent foramen ovale)    a. By echo 09/2013.    [name] Polycythemia   . Prostate cancer ([place]) 07/15/12   Gleason 7, external beam radiation Tx, Dr. Pilar Jarvis  . PVC's (premature ventricular contractions)   . Stroke [facility])    a. Cryptogenic, 09/2013, event monitor with NSR. Small PFO noted on echo but neg LE duplex.    Past Surgical History:  Procedure Laterality Date  . COLONOSCOPY WITH PROPOFOL N/A 02/22/2016   Procedure: COLONOSCOPY WITH PROPOFOL;  Surgeon: Carol Ada, MD;  [facility];  Service: Endoscopy;  Laterality: N/A;  . CORONARY ANGIOPLASTY WITH STENT PLACEMENT  08/05/2012   LAD, 1 stent  . CORONARY STENT INTERVENTION N/A 09/21/2017   Procedure: CORONARY STENT INTERVENTION;  Surgeon: Belva Crome, MD;  Location: [place] CV LAB;  Service: Cardiovascular;  Laterality: N/A;  . EP IMPLANTABLE DEVICE N/A 08/23/2014   Procedure: Loop Recorder Insertion;  Surgeon: Evans Lance, MD;  Location: [place] CV LAB;  Service: Cardiovascular;  Laterality: N/A;  . INSERTION PROSTATE RADIATION SEED    . JOINT REPLACEMENT    . LEFT HEART CATH AND CORONARY ANGIOGRAPHY N/A 09/21/2017   Procedure: LEFT HEART CATH AND CORONARY ANGIOGRAPHY;  Surgeon: Belva Crome, MD;  Location: [place] CV LAB;  Service: Cardiovascular;  Laterality: N/A;  . LITHOTRIPSY  2006  . PERCUTANEOUS CORONARY STENT INTERVENTION (PCI-S) N/A 08/05/2012   Procedure: PERCUTANEOUS CORONARY STENT INTERVENTION (PCI-S);  Surgeon: Sherren Mocha, MD;  Location: [facility] CATH LAB;  Service: Cardiovascular;  Laterality: N/A;  . PROSTATE BIOPSY  07/15/2012; 09/23/2013  . TEE WITHOUT CARDIOVERSION N/A 09/27/2013   Procedure: TRANSESOPHAGEAL ECHOCARDIOGRAM (TEE);  Surgeon: Larey Dresser, MD;  Location: [place];  Service: Cardiovascular;  Laterality: N/A;  . TONSILLECTOMY  1990s  . TOTAL KNEE ARTHROPLASTY  01/24/2011   Procedure: TOTAL KNEE ARTHROPLASTY;  Surgeon: Alta Corning;  Location: [place];  Service: Orthopedics;  Laterality: Left;  COMPUTER ASSISTED LEFT  TOTAL KNEE  REPLACEMENT. Anesthesia a combination of regional and general.    Social History   Socioeconomic History  . Marital status: Widowed    Spouse name: Not on file  . Number of children: 3  . Years of education: 12th  . Highest education level: Not on file  Occupational History  . Occupation: retired  Scientific laboratory technician  . Financial resource strain: Not on file  . Food insecurity:    Worry: Not on file    Inability: Not on file  . Transportation needs:    Medical: Not on file    Non-medical: Not on file  Tobacco Use  . Smoking status: Never Smoker  . Smokeless tobacco: Never Used  Substance and Sexual Activity  . Alcohol use: Not  Currently  . Drug use: Never  . Sexual activity: Not Currently  Lifestyle  . Physical activity:    Days per week: Not on file    Minutes per session: Not on file  . Stress: Not on file  Relationships  . Social connections:    Talks on phone: Not on file    Gets together: Not on file    Attends religious service: Not on file    Active member of club or organization: Not on file    Attends meetings of clubs or organizations: Not on file    Relationship status: Not on file  . Intimate partner violence:    Fear of current or ex partner: Not on file    Emotionally abused: Not on file    Physically abused: Not on file    Forced sexual activity: Not on file  Other Topics Concern  . Not on file  Social History Narrative   [name]   Patient is right handed   Patient drinks coffee and sodas    Family History  Problem Relation Age of Onset  . Leukemia Mother   . Cancer Mother   . Heart Problems Father   . Diabetes Sister        ? father  . Heart Problems Brother   . Heart Problems Unknown        family history  . Stroke Unknown   . Stroke Brother   . Hypertension Brother   . Cervical cancer Sister        ????  . Heart attack Neg Hx     ROS: no fevers or chills, productive cough, hemoptysis, dysphasia,  odynophagia, melena, hematochezia, dysuria, hematuria, rash, seizure activity, orthopnea, PND, pedal edema, claudication. Remaining systems are negative.  Physical Exam: Well-developed well-nourished in no acute distress.  Skin is warm and dry.  HEENT is normal.  Neck is supple.  Chest is clear to auscultation with normal expansion.  Cardiovascular exam is regular rate and rhythm.  Abdominal exam nontender or distended. No masses palpated. Extremities show trace edema. neuro grossly intact  A/P  1 paroxysmal atrial fibrillation-patient is in sinus rhythm today on examination.  We will continue with apixaban.  Continue carvedilol for rate control if atrial fibrillation recurs.  2 thoracic aortic aneurysm-patient will need follow-up CTA July 2020.  3 coronary artery disease-continue Plavix and statin.  Recently atypical chest pain.  If he has more frequent episodes we will plan cardiac catheterization.  4 hypertension-patient's blood pressure is controlled.  Continue present medications and follow.  5 hyperlipidemia-continue statin.  Check lipids and liver.  6 prior CVA-patient has documented atrial fibrillation and will need lifelong anticoagulation.  7 chronic stage III kidney disease  Kirk Ruths, MD

## 2018-01-06 ENCOUNTER — Ambulatory Visit (HOSPITAL_COMMUNITY): Payer: Medicare HMO

## 2018-01-06 ENCOUNTER — Encounter: Payer: Self-pay | Admitting: Cardiology

## 2018-01-07 DIAGNOSIS — E559 Vitamin D deficiency, unspecified: Secondary | ICD-10-CM | POA: Diagnosis not present

## 2018-01-07 DIAGNOSIS — E7800 Pure hypercholesterolemia, unspecified: Secondary | ICD-10-CM | POA: Diagnosis not present

## 2018-01-07 DIAGNOSIS — R5383 Other fatigue: Secondary | ICD-10-CM | POA: Diagnosis not present

## 2018-01-07 DIAGNOSIS — E119 Type 2 diabetes mellitus without complications: Secondary | ICD-10-CM | POA: Diagnosis not present

## 2018-01-07 DIAGNOSIS — I10 Essential (primary) hypertension: Secondary | ICD-10-CM | POA: Diagnosis not present

## 2018-01-08 ENCOUNTER — Ambulatory Visit (HOSPITAL_COMMUNITY): Payer: Medicare HMO

## 2018-01-08 ENCOUNTER — Ambulatory Visit (INDEPENDENT_AMBULATORY_CARE_PROVIDER_SITE_OTHER): Payer: Medicare HMO | Admitting: Cardiology

## 2018-01-08 ENCOUNTER — Encounter: Payer: Self-pay | Admitting: Cardiology

## 2018-01-08 VITALS — BP 109/57 | HR 70 | Ht 69.0 in | Wt 247.0 lb

## 2018-01-08 DIAGNOSIS — I10 Essential (primary) hypertension: Secondary | ICD-10-CM

## 2018-01-08 DIAGNOSIS — E7800 Pure hypercholesterolemia, unspecified: Secondary | ICD-10-CM | POA: Diagnosis not present

## 2018-01-08 DIAGNOSIS — I480 Paroxysmal atrial fibrillation: Secondary | ICD-10-CM

## 2018-01-08 DIAGNOSIS — I2510 Atherosclerotic heart disease of native coronary artery without angina pectoris: Secondary | ICD-10-CM

## 2018-01-08 NOTE — Patient Instructions (Signed)
Medication Instructions:  Continue same medications If you need a refill on your cardiac medications before your next appointment, please call your pharmacy.   Lab work: Fasting lab next week Lab opens 8:00 am to 12:00 noon  No appointment needed If you have labs (blood work) drawn today and your tests are completely normal, you will receive your results only by: [name] MyChart Message (if you have MyChart) OR . A paper copy in the mail If you have any lab test that is abnormal or we need to change your treatment, we will call you to review the results.  Testing/Procedures: None ordered  Follow-Up: At [facility], you and your health needs are our priority.  As part of our continuing mission to provide you with exceptional heart care, we have created designated Provider Care Teams.  These Care Teams include your primary Cardiologist (physician) and Advanced Practice Providers (APPs -  Physician Assistants and Nurse Practitioners) who all work together to provide you with the care you need, when you need it. . Tuesday 03/30/18 at 8:00 am

## 2018-01-11 ENCOUNTER — Ambulatory Visit (HOSPITAL_COMMUNITY): Payer: Medicare HMO

## 2018-01-13 ENCOUNTER — Ambulatory Visit (HOSPITAL_COMMUNITY): Payer: Medicare HMO

## 2018-01-13 DIAGNOSIS — I2510 Atherosclerotic heart disease of native coronary artery without angina pectoris: Secondary | ICD-10-CM | POA: Diagnosis not present

## 2018-01-13 DIAGNOSIS — E7800 Pure hypercholesterolemia, unspecified: Secondary | ICD-10-CM | POA: Diagnosis not present

## 2018-01-13 LAB — HEPATIC FUNCTION PANEL
ALT: 16 IU/L (ref 0–44)
AST: 16 IU/L (ref 0–40)
Albumin: 3.8 g/dL (ref 3.5–4.8)
Alkaline Phosphatase: 105 IU/L (ref 39–117)
Bilirubin Total: 0.6 mg/dL (ref 0.0–1.2)
Bilirubin, Direct: 0.16 mg/dL (ref 0.00–0.40)
Total Protein: 6.9 g/dL (ref 6.0–8.5)

## 2018-01-13 LAB — LIPID PANEL W/O CHOL/HDL RATIO
Cholesterol, Total: 104 mg/dL (ref 100–199)
HDL: 31 mg/dL — ABNORMAL LOW (ref >39)
LDL Calculated: 55 mg/dL (ref 0–99)
Triglycerides: 90 mg/dL (ref 0–149)
VLDL Cholesterol Cal: 18 mg/dL (ref 5–40)

## 2018-01-15 ENCOUNTER — Telehealth: Payer: Self-pay | Admitting: *Deleted

## 2018-01-15 ENCOUNTER — Ambulatory Visit (HOSPITAL_COMMUNITY): Payer: Medicare HMO

## 2018-01-15 NOTE — Telephone Encounter (Signed)
Patient dropped off - patient assistance formed to be signed and faxed to [facility]-- [facility]  forms given to  Dr Jacalyn Lefevre nurse

## 2018-01-18 ENCOUNTER — Ambulatory Visit (HOSPITAL_COMMUNITY): Payer: Medicare HMO

## 2018-01-18 ENCOUNTER — Other Ambulatory Visit: Payer: Self-pay | Admitting: *Deleted

## 2018-01-18 MED ORDER — APIXABAN 5 MG PO TABS: 5.0000 mg | ORAL_TABLET | Freq: Two times a day (BID) | ORAL | 3 refills | Status: AC

## 2018-01-19 NOTE — Progress Notes (Signed)
James Mcclain 72 y.o. male DOB August 29, 1945 MRN [id number]       Nutrition  No diagnosis found. Past Medical History:  Diagnosis Date  . Arthritis    "right hand" (09/21/2017)  . CHF (congestive heart failure) ([place])   . CKD (chronic kidney disease) stage 3, GFR 30-59 ml/min (HCC)   . Coronary artery disease    a. s/p Xience DES to the LAD 07/2012. b. 09/21/17 Synergy stent to RI.   . Dilated aortic root ([place])    a. Mildly dilated by echo 09/2013, f/u MRA scheduled for 12/2014.  [name] Elevated PSA   . Essential hypertension   . Gout    "on daily RX" (09/21/2017)  . Heart murmur   . History of kidney stones   . Hyperlipidemia   . Insulin dependent diabetes mellitus ([place])   . 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 On tele 09/2013.  [name] Paroxysmal atrial fibrillation (HCC)   . PFO (patent foramen ovale)    a. By echo 09/2013.  [name] Polycythemia   . Prostate cancer ([place]) 07/15/12   Gleason 7, external beam radiation Tx, Dr. Pilar Jarvis  . PVC's (premature ventricular contractions)   . Stroke [facility])    a. Cryptogenic, 09/2013, event monitor with NSR. Small PFO noted on echo but neg LE duplex.   Meds reviewed.    Current Outpatient Medications (Endocrine & Metabolic):  [name]  Dapagliflozin Propanediol (FARXIGA PO), Take 5 mg by mouth. .  Insulin Glargine (LANTUS SOLOSTAR) 100 UNIT/ML Solostar Pen, Inject 45 Units into the skin daily. [name]  JANUVIA 100 MG tablet, TAKE 1 TABLET(100 MG) BY MOUTH DAILY  Current Outpatient Medications (Cardiovascular):  .  amLODipine (NORVASC) 10 MG tablet, Take 1 tablet (10 mg total) by mouth daily. [name]  atorvastatin (LIPITOR) 80 MG tablet, Take 1 tablet (80 mg total) by mouth daily at 6 PM. .  carvedilol (COREG) 25 MG tablet, Take 1 tablet (25 mg total) by mouth 2 (two) times daily. .  hydrALAZINE (APRESOLINE) 25 MG tablet, Take 1 tablet (25 mg total) by mouth 2 (two) times daily. .  isosorbide mononitrate (IMDUR) 30 MG 24 hr tablet, Take 1 tablet (30 mg total) by mouth  daily. [name]  lisinopril (PRINIVIL,ZESTRIL) 40 MG tablet, Take 40 mg by mouth daily. .  nitroGLYCERIN (NITROSTAT) 0.4 MG SL tablet, Place 1 tablet (0.4 mg total) under the tongue every 5 (five) minutes as needed for chest pain. Do not take more than 3 nitroglycerine  Current Outpatient Medications (Respiratory):  .  albuterol (PROVENTIL HFA;VENTOLIN HFA) 108 (90 Base) MCG/ACT inhaler, Inhale 2 puffs into the lungs every 6 (six) hours as needed for wheezing or shortness of breath.  Current Outpatient Medications (Analgesics):  .  allopurinol (ZYLOPRIM) 100 MG tablet, TAKE TWO TABLETS BY MOUTH ONCE DAILY  Current Outpatient Medications (Hematological):  .  apixaban (ELIQUIS) 5 MG TABS tablet, Take 1 tablet (5 mg total) by mouth 2 (two) times daily. .  clopidogrel (PLAVIX) 75 MG tablet, Take 1 tablet (75 mg total) by mouth daily with breakfast.  Current Outpatient Medications (Other):  .  Blood Glucose Monitoring Suppl (ONETOUCH VERIO) w/Device KIT, 1 Device by Does not apply route 2 (two) times daily. E11.9 .  glucose blood test strip, Check BS bid DX: E11.9 .  Insulin Syringe-Needle U-100 (SAFETY INSULIN SYRINGES) 30G X 5/16" 0.5 ML MISC, As needed for insulin .  trimethoprim-polymyxin b (POLYTRIM) ophthalmic solution, Place 2 drops into both eyes every 4 (four) hours.  HT: Ht Readings from Last 1 Encounters:  01/08/18 '5\' 9"'$  (1.753 m)    WT: Wt Readings from Last 5 Encounters:  01/08/18 247 lb (112 kg)  12/17/17 246 lb 6.4 oz (111.8 kg)  11/30/17 250 lb (113.4 kg)  10/23/17 240 lb (108.9 kg)  10/07/17 241 lb (109.3 kg)     BMI= 36.46 01/08/18  Current tobacco use? No       Labs:  Lipid Panel     Component Value Date/Time   CHOL 104 01/13/2018 0841   TRIG 90 01/13/2018 0841   HDL 31 (L) 01/13/2018 0841   CHOLHDL 3.2 11/20/2015 0818   VLDL 16 11/20/2015 0818   LDLCALC 55 01/13/2018 0841    Lab Results  Component Value Date   HGBA1C 7.3 (H) 09/21/2017   CBG (last 3)  No  results for input(s): GLUCAP in the last 72 hours.  Nutrition Diagnosis ? Food-and nutrition-related knowledge deficit related to lack of exposure to information as related to diagnosis of: ? CVD ? Type 2 Diabetes  ? CKD III ? Obese  II = 35-39.9 related to excessive energy intake as evidenced by a BMI 36.46  Nutrition Goal(s):  ? To be determined  Plan:  Pt to attend nutrition classes ? Nutrition I ? Nutrition II ? Portion Distortion  ? Diabetes Blitz ? Diabetes Q & A Will provide client-centered nutrition education as part of interdisciplinary care.   Monitor and evaluate progress toward nutrition goal with team.  Laurina Bustle, MS, RD, LDN 01/19/2018 6:25 PM

## 2018-01-20 ENCOUNTER — Telehealth (HOSPITAL_COMMUNITY): Payer: Self-pay

## 2018-01-20 ENCOUNTER — Ambulatory Visit (HOSPITAL_COMMUNITY): Payer: Medicare HMO

## 2018-01-20 DIAGNOSIS — J181 Lobar pneumonia, unspecified organism: Secondary | ICD-10-CM

## 2018-01-20 DIAGNOSIS — E1149 Type 2 diabetes mellitus with other diabetic neurological complication: Secondary | ICD-10-CM

## 2018-01-20 DIAGNOSIS — I480 Paroxysmal atrial fibrillation: Secondary | ICD-10-CM

## 2018-01-20 DIAGNOSIS — J189 Pneumonia, unspecified organism: Secondary | ICD-10-CM

## 2018-01-20 DIAGNOSIS — I7121 Aneurysm of the ascending aorta, without rupture: Secondary | ICD-10-CM

## 2018-01-20 DIAGNOSIS — I712 Thoracic aortic aneurysm, without rupture: Secondary | ICD-10-CM

## 2018-01-20 DIAGNOSIS — I10 Essential (primary) hypertension: Secondary | ICD-10-CM

## 2018-01-20 NOTE — Telephone Encounter (Signed)
Application faxed to the company, patient made aware.

## 2018-01-22 ENCOUNTER — Ambulatory Visit (HOSPITAL_COMMUNITY): Payer: Medicare HMO

## 2018-01-25 ENCOUNTER — Ambulatory Visit (HOSPITAL_COMMUNITY): Payer: Medicare HMO

## 2018-01-25 ENCOUNTER — Telehealth (HOSPITAL_COMMUNITY): Payer: Self-pay

## 2018-01-25 DIAGNOSIS — L0390 Cellulitis, unspecified: Secondary | ICD-10-CM | POA: Diagnosis not present

## 2018-01-25 DIAGNOSIS — H938X2 Other specified disorders of left ear: Secondary | ICD-10-CM | POA: Diagnosis not present

## 2018-01-25 DIAGNOSIS — H9202 Otalgia, left ear: Secondary | ICD-10-CM | POA: Diagnosis not present

## 2018-01-26 ENCOUNTER — Encounter: Payer: Self-pay | Admitting: Cardiology

## 2018-01-26 DIAGNOSIS — Z7901 Long term (current) use of anticoagulants: Secondary | ICD-10-CM | POA: Insufficient documentation

## 2018-01-26 NOTE — Telephone Encounter (Addendum)
Cardiac Rehab Medication Review by a Pharmacist  Does the patient  feel that his/her medications are working for him/her?  yes  Has the patient been experiencing any side effects to the medications prescribed?  No  Does the patient measure his/her own blood pressure or blood glucose at home?  Yes, BP130/80, BG 130  Does the patient have any problems obtaining medications due to transportation or finances?   Yes, unaffordable copay for dapagliflozin   Understanding of regimen: good Understanding of indications: good Potential of compliance: good  Pharmacist comments: Patient is experiencing some shortness of breath on exertion and admits to some LEE. Patient reports SOB resolves on rest. Upon chart review this is not a new issue and PCP and cardiology are aware. Patient is seeing his MD tomorrow and will discuss this issue with them. Patient encouraged to maintain a low sodium diet and report side effects to MD at appointment or earlier if worsening symptoms.    Patient also reports his dapagliflozin has a $500 copay. Patient is on [facility] and is a [facility] patient. Consider referral to [facility] services to help patient with patient assistant programs. Consider change in prescription to empagliflozin as [employer] may not require patient out of pocket spending prior to patient assistance.   James Mcclain, [place] D PGY1 Pharmacy Resident  01/26/2018      11:13 AM

## 2018-01-27 ENCOUNTER — Ambulatory Visit: Payer: Medicare HMO | Admitting: Cardiology

## 2018-01-27 ENCOUNTER — Telehealth (HOSPITAL_COMMUNITY): Payer: Self-pay | Admitting: *Deleted

## 2018-01-27 ENCOUNTER — Encounter: Payer: Self-pay | Admitting: Cardiology

## 2018-01-27 ENCOUNTER — Emergency Department (HOSPITAL_COMMUNITY): Payer: Medicare HMO

## 2018-01-27 ENCOUNTER — Ambulatory Visit (HOSPITAL_COMMUNITY): Payer: Medicare HMO

## 2018-01-27 ENCOUNTER — Observation Stay (HOSPITAL_COMMUNITY)
Admission: EM | Admit: 2018-01-27 | Discharge: 2018-01-29 | Disposition: A | Discharge status: Home / Self Care | Payer: Medicare HMO | Attending: Internal Medicine | Admitting: Internal Medicine

## 2018-01-27 ENCOUNTER — Encounter (HOSPITAL_COMMUNITY): Payer: Self-pay | Admitting: *Deleted

## 2018-01-27 VITALS — BP 118/62 | HR 76 | Ht 69.0 in | Wt 251.4 lb

## 2018-01-27 DIAGNOSIS — I7121 Aneurysm of the ascending aorta, without rupture: Secondary | ICD-10-CM | POA: Insufficient documentation

## 2018-01-27 DIAGNOSIS — Z87442 Personal history of urinary calculi: Secondary | ICD-10-CM | POA: Insufficient documentation

## 2018-01-27 DIAGNOSIS — Z8546 Personal history of malignant neoplasm of prostate: Secondary | ICD-10-CM | POA: Diagnosis not present

## 2018-01-27 DIAGNOSIS — J189 Pneumonia, unspecified organism: Secondary | ICD-10-CM | POA: Diagnosis present

## 2018-01-27 DIAGNOSIS — Z8673 Personal history of transient ischemic attack (TIA), and cerebral infarction without residual deficits: Secondary | ICD-10-CM | POA: Diagnosis not present

## 2018-01-27 DIAGNOSIS — R0609 Other forms of dyspnea: Secondary | ICD-10-CM

## 2018-01-27 DIAGNOSIS — E1122 Type 2 diabetes mellitus with diabetic chronic kidney disease: Secondary | ICD-10-CM | POA: Insufficient documentation

## 2018-01-27 DIAGNOSIS — I130 Hypertensive heart and chronic kidney disease with heart failure and stage 1 through stage 4 chronic kidney disease, or unspecified chronic kidney disease: Secondary | ICD-10-CM | POA: Insufficient documentation

## 2018-01-27 DIAGNOSIS — I4510 Unspecified right bundle-branch block: Secondary | ICD-10-CM | POA: Insufficient documentation

## 2018-01-27 DIAGNOSIS — Z955 Presence of coronary angioplasty implant and graft: Secondary | ICD-10-CM | POA: Diagnosis not present

## 2018-01-27 DIAGNOSIS — E119 Type 2 diabetes mellitus without complications: Secondary | ICD-10-CM | POA: Diagnosis not present

## 2018-01-27 DIAGNOSIS — Z91041 Radiographic dye allergy status: Secondary | ICD-10-CM | POA: Insufficient documentation

## 2018-01-27 DIAGNOSIS — I509 Heart failure, unspecified: Secondary | ICD-10-CM | POA: Insufficient documentation

## 2018-01-27 DIAGNOSIS — Z794 Long term (current) use of insulin: Secondary | ICD-10-CM | POA: Diagnosis not present

## 2018-01-27 DIAGNOSIS — I160 Hypertensive urgency: Secondary | ICD-10-CM

## 2018-01-27 DIAGNOSIS — J181 Lobar pneumonia, unspecified organism: Secondary | ICD-10-CM

## 2018-01-27 DIAGNOSIS — Z7902 Long term (current) use of antithrombotics/antiplatelets: Secondary | ICD-10-CM | POA: Diagnosis not present

## 2018-01-27 DIAGNOSIS — Z79899 Other long term (current) drug therapy: Secondary | ICD-10-CM | POA: Insufficient documentation

## 2018-01-27 DIAGNOSIS — I2510 Atherosclerotic heart disease of native coronary artery without angina pectoris: Secondary | ICD-10-CM | POA: Diagnosis not present

## 2018-01-27 DIAGNOSIS — Z7901 Long term (current) use of anticoagulants: Secondary | ICD-10-CM | POA: Diagnosis not present

## 2018-01-27 DIAGNOSIS — R972 Elevated prostate specific antigen [PSA]: Secondary | ICD-10-CM | POA: Insufficient documentation

## 2018-01-27 DIAGNOSIS — I480 Paroxysmal atrial fibrillation: Secondary | ICD-10-CM | POA: Diagnosis not present

## 2018-01-27 DIAGNOSIS — M19041 Primary osteoarthritis, right hand: Secondary | ICD-10-CM | POA: Diagnosis not present

## 2018-01-27 DIAGNOSIS — D751 Secondary polycythemia: Secondary | ICD-10-CM

## 2018-01-27 DIAGNOSIS — N183 Chronic kidney disease, stage 3 unspecified: Secondary | ICD-10-CM | POA: Diagnosis present

## 2018-01-27 DIAGNOSIS — R011 Cardiac murmur, unspecified: Secondary | ICD-10-CM | POA: Insufficient documentation

## 2018-01-27 DIAGNOSIS — R0600 Dyspnea, unspecified: Secondary | ICD-10-CM

## 2018-01-27 DIAGNOSIS — I200 Unstable angina: Secondary | ICD-10-CM | POA: Diagnosis present

## 2018-01-27 DIAGNOSIS — M109 Gout, unspecified: Secondary | ICD-10-CM | POA: Insufficient documentation

## 2018-01-27 DIAGNOSIS — Z96652 Presence of left artificial knee joint: Secondary | ICD-10-CM | POA: Insufficient documentation

## 2018-01-27 DIAGNOSIS — R0603 Acute respiratory distress: Principal | ICD-10-CM | POA: Diagnosis present

## 2018-01-27 DIAGNOSIS — K449 Diaphragmatic hernia without obstruction or gangrene: Secondary | ICD-10-CM | POA: Insufficient documentation

## 2018-01-27 DIAGNOSIS — R918 Other nonspecific abnormal finding of lung field: Secondary | ICD-10-CM | POA: Diagnosis not present

## 2018-01-27 DIAGNOSIS — I25110 Atherosclerotic heart disease of native coronary artery with unstable angina pectoris: Secondary | ICD-10-CM | POA: Insufficient documentation

## 2018-01-27 DIAGNOSIS — Z9861 Coronary angioplasty status: Secondary | ICD-10-CM

## 2018-01-27 DIAGNOSIS — I712 Thoracic aortic aneurysm, without rupture: Secondary | ICD-10-CM | POA: Insufficient documentation

## 2018-01-27 DIAGNOSIS — E785 Hyperlipidemia, unspecified: Secondary | ICD-10-CM | POA: Insufficient documentation

## 2018-01-27 DIAGNOSIS — IMO0001 Reserved for inherently not codable concepts without codable children: Secondary | ICD-10-CM

## 2018-01-27 DIAGNOSIS — I700 Atherosclerosis of aorta: Secondary | ICD-10-CM | POA: Insufficient documentation

## 2018-01-27 LAB — CBC WITH DIFFERENTIAL/PLATELET
Band Neutrophils: 0 %
Basophils Absolute: 0 10*3/uL (ref 0.0–0.1)
Basophils Relative: 0 %
Blasts: 0 %
Eosinophils Absolute: 0.3 10*3/uL (ref 0.0–0.5)
Eosinophils Relative: 2 %
HCT: 61.3 % — ABNORMAL HIGH (ref 39.0–52.0)
Hemoglobin: 16.3 g/dL (ref 13.0–17.0)
Lymphocytes Relative: 6 %
Lymphs Abs: 1 10*3/uL (ref 0.7–4.0)
MCH: 20 pg — ABNORMAL LOW (ref 26.0–34.0)
MCHC: 26.6 g/dL — ABNORMAL LOW (ref 30.0–36.0)
MCV: 75.1 fL — ABNORMAL LOW (ref 80.0–100.0)
Metamyelocytes Relative: 0 %
Monocytes Absolute: 1 10*3/uL (ref 0.1–1.0)
Monocytes Relative: 6 %
Myelocytes: 0 %
Neutro Abs: 13.6 10*3/uL — ABNORMAL HIGH (ref 1.7–7.7)
Neutrophils Relative %: 86 %
Other: 0 %
Platelets: 407 10*3/uL — ABNORMAL HIGH (ref 150–400)
Promyelocytes Relative: 0 %
RBC: 8.16 MIL/uL — ABNORMAL HIGH (ref 4.22–5.81)
RDW: 24.3 % — ABNORMAL HIGH (ref 11.5–15.5)
WBC: 15.9 10*3/uL — ABNORMAL HIGH (ref 4.0–10.5)
nRBC: 0 /100 WBC
nRBC: 0.6 % — ABNORMAL HIGH (ref 0.0–0.2)

## 2018-01-27 LAB — BASIC METABOLIC PANEL
Anion gap: 9 (ref 5–15)
BUN: 26 mg/dL — ABNORMAL HIGH (ref 8–23)
CO2: 25 mmol/L (ref 22–32)
Calcium: 8.7 mg/dL — ABNORMAL LOW (ref 8.9–10.3)
Chloride: 108 mmol/L (ref 98–111)
Creatinine, Ser: 1.81 mg/dL — ABNORMAL HIGH (ref 0.61–1.24)
GFR calc Af Amer: 43 mL/min — ABNORMAL LOW (ref >60)
GFR calc non Af Amer: 37 mL/min — ABNORMAL LOW (ref >60)
Glucose, Bld: 117 mg/dL — ABNORMAL HIGH (ref 70–99)
Potassium: 4.1 mmol/L (ref 3.5–5.1)
Sodium: 142 mmol/L (ref 135–145)

## 2018-01-27 LAB — INFLUENZA PANEL BY PCR (TYPE A & B)
Influenza A By PCR: NEGATIVE
Influenza B By PCR: NEGATIVE

## 2018-01-27 LAB — PROTIME-INR
INR: 1.19
Prothrombin Time: 15 seconds (ref 11.4–15.2)

## 2018-01-27 LAB — BRAIN NATRIURETIC PEPTIDE: B Natriuretic Peptide: 322.6 pg/mL — ABNORMAL HIGH (ref 0.0–100.0)

## 2018-01-27 LAB — I-STAT CG4 LACTIC ACID, ED: Lactic Acid, Venous: 1 mmol/L (ref 0.5–1.9)

## 2018-01-27 LAB — CBG MONITORING, ED: Glucose-Capillary: 143 mg/dL — ABNORMAL HIGH (ref 70–99)

## 2018-01-27 LAB — TROPONIN I: Troponin I: 0.04 ng/mL (ref <0.03)

## 2018-01-27 MED ORDER — ALBUTEROL SULFATE (2.5 MG/3ML) 0.083% IN NEBU: 2.5000 mg | INHALATION_SOLUTION | RESPIRATORY_TRACT | Status: DC | PRN

## 2018-01-27 MED ORDER — ACETAMINOPHEN 650 MG RE SUPP: 650.0000 mg | Freq: Four times a day (QID) | RECTAL | Status: DC | PRN

## 2018-01-27 MED ORDER — INSULIN ASPART 100 UNIT/ML ~~LOC~~ SOLN
0.0000 [IU] | Freq: Three times a day (TID) | SUBCUTANEOUS | Status: DC
  Administered 2018-01-28: 1 [IU] via SUBCUTANEOUS

## 2018-01-27 MED ORDER — ATORVASTATIN CALCIUM 80 MG PO TABS
80.0000 mg | ORAL_TABLET | Freq: Every day | ORAL | Status: DC
  Administered 2018-01-28: 80 mg via ORAL
  Filled 2018-01-27: qty 1

## 2018-01-27 MED ORDER — ACETAMINOPHEN 325 MG PO TABS: 650.0000 mg | ORAL_TABLET | Freq: Four times a day (QID) | ORAL | Status: DC | PRN

## 2018-01-27 MED ORDER — ALLOPURINOL 100 MG PO TABS
200.0000 mg | ORAL_TABLET | Freq: Every day | ORAL | Status: DC
  Administered 2018-01-28 – 2018-01-29 (×2): 200 mg via ORAL
  Filled 2018-01-27 (×3): qty 2

## 2018-01-27 MED ORDER — SENNOSIDES-DOCUSATE SODIUM 8.6-50 MG PO TABS: 1.0000 | ORAL_TABLET | Freq: Every evening | ORAL | Status: DC | PRN

## 2018-01-27 MED ORDER — INSULIN GLARGINE 100 UNIT/ML ~~LOC~~ SOLN
25.0000 [IU] | Freq: Every day | SUBCUTANEOUS | Status: DC
  Administered 2018-01-28 – 2018-01-29 (×2): 25 [IU] via SUBCUTANEOUS
  Filled 2018-01-27 (×2): qty 0.25

## 2018-01-27 MED ORDER — HYDROCODONE-ACETAMINOPHEN 5-325 MG PO TABS: 1.0000 | ORAL_TABLET | ORAL | Status: DC | PRN

## 2018-01-27 MED ORDER — SODIUM CHLORIDE 0.9% FLUSH
3.0000 mL | Freq: Two times a day (BID) | INTRAVENOUS | Status: DC
  Administered 2018-01-28 – 2018-01-29 (×3): 3 mL via INTRAVENOUS

## 2018-01-27 MED ORDER — NITROGLYCERIN 0.4 MG SL SUBL
0.4000 mg | SUBLINGUAL_TABLET | SUBLINGUAL | Status: DC | PRN
  Administered 2018-01-28 – 2018-01-29 (×5): 0.4 mg via SUBLINGUAL
  Filled 2018-01-27 (×4): qty 1

## 2018-01-27 MED ORDER — CARVEDILOL 25 MG PO TABS
25.0000 mg | ORAL_TABLET | Freq: Two times a day (BID) | ORAL | Status: DC
  Administered 2018-01-28 – 2018-01-29 (×3): 25 mg via ORAL
  Filled 2018-01-27 (×4): qty 1
  Filled 2018-01-27: qty 2

## 2018-01-27 MED ORDER — CLOPIDOGREL BISULFATE 75 MG PO TABS
75.0000 mg | ORAL_TABLET | Freq: Every day | ORAL | Status: DC
  Administered 2018-01-28 – 2018-01-29 (×2): 75 mg via ORAL
  Filled 2018-01-27 (×2): qty 1

## 2018-01-27 MED ORDER — SODIUM CHLORIDE 0.9 % IV SOLN
500.0000 mg | INTRAVENOUS | Status: DC
  Administered 2018-01-27 – 2018-01-28 (×2): 500 mg via INTRAVENOUS
  Filled 2018-01-27 (×5): qty 500

## 2018-01-27 MED ORDER — SODIUM CHLORIDE 0.9 % IV SOLN: 250.0000 mL | INTRAVENOUS | Status: DC | PRN

## 2018-01-27 MED ORDER — SODIUM CHLORIDE 0.9% FLUSH: 3.0000 mL | INTRAVENOUS | Status: DC | PRN

## 2018-01-27 MED ORDER — SODIUM CHLORIDE 0.9 % IV SOLN
2.0000 g | INTRAVENOUS | Status: DC
  Administered 2018-01-27 – 2018-01-28 (×2): 2 g via INTRAVENOUS
  Filled 2018-01-27 (×3): qty 20

## 2018-01-27 MED ORDER — SODIUM CHLORIDE 0.9% FLUSH
3.0000 mL | Freq: Two times a day (BID) | INTRAVENOUS | Status: DC
  Administered 2018-01-28: 3 mL via INTRAVENOUS

## 2018-01-27 MED ORDER — ISOSORBIDE MONONITRATE ER 60 MG PO TB24
60.0000 mg | ORAL_TABLET | Freq: Every day | ORAL | Status: DC
  Administered 2018-01-28 – 2018-01-29 (×2): 60 mg via ORAL
  Filled 2018-01-27 (×2): qty 1

## 2018-01-27 MED ORDER — ONDANSETRON HCL 4 MG PO TABS: 4.0000 mg | ORAL_TABLET | Freq: Four times a day (QID) | ORAL | Status: DC | PRN

## 2018-01-27 MED ORDER — AMLODIPINE BESYLATE 10 MG PO TABS
10.0000 mg | ORAL_TABLET | Freq: Every day | ORAL | Status: DC
  Administered 2018-01-28 – 2018-01-29 (×2): 10 mg via ORAL
  Filled 2018-01-27: qty 2
  Filled 2018-01-27: qty 1

## 2018-01-27 MED ORDER — HYDRALAZINE HCL 25 MG PO TABS
25.0000 mg | ORAL_TABLET | Freq: Two times a day (BID) | ORAL | Status: DC
  Administered 2018-01-28 – 2018-01-29 (×3): 25 mg via ORAL
  Filled 2018-01-27 (×4): qty 1

## 2018-01-27 MED ORDER — ISOSORBIDE MONONITRATE ER 60 MG PO TB24: 60.0000 mg | ORAL_TABLET | Freq: Every day | ORAL | 2 refills | Status: DC

## 2018-01-27 MED ORDER — ONDANSETRON HCL 4 MG/2ML IJ SOLN: 4.0000 mg | Freq: Four times a day (QID) | INTRAMUSCULAR | Status: DC | PRN

## 2018-01-27 MED ORDER — APIXABAN 5 MG PO TABS
5.0000 mg | ORAL_TABLET | Freq: Two times a day (BID) | ORAL | Status: DC
  Administered 2018-01-28: 5 mg via ORAL
  Filled 2018-01-27 (×2): qty 1

## 2018-01-27 MED ORDER — INSULIN ASPART 100 UNIT/ML ~~LOC~~ SOLN: 0.0000 [IU] | Freq: Every day | SUBCUTANEOUS | Status: DC

## 2018-01-27 NOTE — Progress Notes (Signed)
01/27/2018 James Mcclain   [birth year]  [id number]  Primary Physician James Mcclain, [place] Primary Cardiologist: Dr Stanford Breed  HPI: [name] is a complicated 72 year old male seen in the office today as a post ER follow-up.  The patient had an LAD drug-eluting stent placed in 2014.  2015 he had a stroke.  PAF was documented by monitor and he has been on Eliquis.  He has insulin-dependent diabetes and stage IIIb chronic renal insufficiency.  In July 2019 he presented with unstable angina and had a catheterization and a ramus intermedius stent was placed.  Since then has been in and out of the emergency room with chest pain and shortness of breath.  At some point post PCI in July there was some mixup about his medications and he was not taking Plavix for 3 weeks or so.  He was just seen in the emergency room on 01/04/2018 again with shortness of breath.  He was seen by Dr. [place] who felt he was stable for discharge.  He did note that there was a 50% proximal residual narrowing in the ramus.  He suggested that the patient continued to have symptoms that he would most likely need a diagnostic catheterization.  He does not think a Myoview would be helpful as his pre-PCI Myoview was negative.  Patient is seen in the office today in follow-up.  He tells me that every time he tries to exert himself he gets short of breath.  He says this started about 3 or 4 weeks after his PCI in July 2019.  He denies having chest pain.  His symptoms are relieved with rest.   Current Outpatient Medications  Medication Sig Dispense Refill  . albuterol (PROVENTIL HFA;VENTOLIN HFA) 108 (90 Base) MCG/ACT inhaler Inhale 2 puffs into the lungs every 6 (six) hours as needed for wheezing or shortness of breath. 1 Inhaler 0  . allopurinol (ZYLOPRIM) 100 MG tablet TAKE TWO TABLETS BY MOUTH ONCE DAILY 60 tablet 5  . amLODipine (NORVASC) 10 MG tablet Take 1 tablet (10 mg total) by mouth daily. 90 tablet 3  . apixaban (ELIQUIS) 5 MG  TABS tablet Take 1 tablet (5 mg total) by mouth 2 (two) times daily. 180 tablet 3  . atorvastatin (LIPITOR) 80 MG tablet Take 1 tablet (80 mg total) by mouth daily at 6 PM. 90 tablet 3  . Blood Glucose Monitoring Suppl (ONETOUCH VERIO) w/Device KIT 1 Device by Does not apply route 2 (two) times daily. E11.9 1 kit 2  . carvedilol (COREG) 25 MG tablet Take 1 tablet (25 mg total) by mouth 2 (two) times daily. 180 tablet 3  . clopidogrel (PLAVIX) 75 MG tablet Take 1 tablet (75 mg total) by mouth daily with breakfast. 34 tablet 5  . Dapagliflozin Propanediol (FARXIGA PO) Take 5 mg by mouth.    [name] glucose blood test strip Check BS bid DX: E11.9 100 each 12  . Insulin Glargine (LANTUS SOLOSTAR) 100 UNIT/ML Solostar Pen Inject 45 Units into the skin daily. 15 mL 3  . Insulin Syringe-Needle U-100 (SAFETY INSULIN SYRINGES) 30G X 5/16" 0.5 ML MISC As needed for insulin 100 each 11  . isosorbide mononitrate (IMDUR) 60 MG 24 hr tablet Take 1 tablet (60 mg total) by mouth daily. 30 tablet 2  . JANUVIA 100 MG tablet TAKE 1 TABLET(100 MG) BY MOUTH DAILY 90 tablet 3  . lisinopril (PRINIVIL,ZESTRIL) 40 MG tablet Take 40 mg by mouth daily.    . nitroGLYCERIN (NITROSTAT) 0.4 MG  SL tablet Place 1 tablet (0.4 mg total) under the tongue every 5 (five) minutes as needed for chest pain. Do not take more than 3 nitroglycerine 25 tablet 3  . hydrALAZINE (APRESOLINE) 25 MG tablet Take 1 tablet (25 mg total) by mouth 2 (two) times daily. 180 tablet 3   No current facility-administered medications for this visit.     Allergies  Allergen Reactions  . Betadine [Povidone Iodine] Other (See Comments)    Patient doesn't remember  . Contrast Media [Iodinated Diagnostic Agents] Other (See Comments)    Patient doesn't remember  . Fish Allergy Swelling    Past Medical History:  Diagnosis Date  . Arthritis    "right hand" (09/21/2017)  . CHF (congestive heart failure) ([place])   . CKD (chronic kidney disease) stage 3, GFR 30-59  ml/min (HCC)   . Coronary artery disease    a. s/p Xience DES to the LAD 07/2012. b. 09/21/17 Synergy stent to RI.   . Dilated aortic root ([place])    a. Mildly dilated by echo 09/2013, f/u MRA scheduled for 12/2014.  [name] Elevated PSA   . Essential hypertension   . Gout    "on daily RX" (09/21/2017)  . Heart murmur   . History of kidney stones   . Hyperlipidemia   . Insulin dependent diabetes mellitus ([place])   . 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 On tele 09/2013.  [name] Paroxysmal atrial fibrillation (HCC)   . PFO (patent foramen ovale)    a. By echo 09/2013.  [name] Polycythemia   . Prostate cancer ([place]) 07/15/12   Gleason 7, external beam radiation Tx, Dr. Pilar Jarvis  . PVC's (premature ventricular contractions)   . Stroke [facility])    a. Cryptogenic, 09/2013, event monitor with NSR. Small PFO noted on echo but neg LE duplex.    Social History   Socioeconomic History  . Marital status: Widowed    Spouse name: Not on file  . Number of children: 3  . Years of education: 12th  . Highest education level: Not on file  Occupational History  . Occupation: retired  Scientific laboratory technician  . Financial resource strain: Not on file  . Food insecurity:    Worry: Not on file    Inability: Not on file  . Transportation needs:    Medical: Not on file    Non-medical: Not on file  Tobacco Use  . Smoking status: Never Smoker  . Smokeless tobacco: Never Used  Substance and Sexual Activity  . Alcohol use: Not Currently  . Drug use: Never  . Sexual activity: Not Currently  Lifestyle  . Physical activity:    Days per week: Not on file    Minutes per session: Not on file  . Stress: Not on file  Relationships  . Social connections:    Talks on phone: Not on file    Gets together: Not on file    Attends religious service: Not on file    Active member of club or organization: Not on file    Attends meetings of clubs or organizations: Not on file    Relationship status: Not on file  . Intimate partner violence:     Fear of current or ex partner: Not on file    Emotionally abused: Not on file    Physically abused: Not on file    Forced sexual activity: Not on file  Other Topics Concern  . Not on file  Social History Narrative   Patient  lives at home with his wife   Patient is right handed   Patient drinks coffee and sodas     Family History  Problem Relation Age of Onset  . Leukemia Mother   . Cancer Mother   . Heart Problems Father   . Diabetes Sister        ? father  . Heart Problems Brother   . Heart Problems Unknown        family history  . Stroke Unknown   . Stroke Brother   . Hypertension Brother   . Cervical cancer Sister        ????  . Heart attack Neg Hx      Review of Systems: General: negative for chills, fever, night sweats or weight changes.  Cardiovascular: negative for chest pain, edema, orthopnea, palpitations, paroxysmal nocturnal dyspnea or shortness of breath Dermatological: negative for rash Respiratory: negative for cough or wheezing Urologic: negative for hematuria Abdominal: negative for nausea, vomiting, diarrhea, bright red blood per rectum, melena, or hematemesis Neurologic: negative for visual changes, syncope, or dizziness All other systems reviewed and are otherwise negative except as noted above.    Blood pressure 118/62, pulse 76, height '5\' 9"'$  (1.753 m), weight 251 lb 6.4 oz (114 kg), SpO2 97 %.  General appearance: alert, cooperative, no distress and hearing aid in place Neck: no carotid bruit and no JVD Lungs: clear to auscultation bilaterally Heart: regular rate and rhythm Abdomen: soft non tender Extremities: no edema Pulses: Rt radial pulse 3/4 Skin: Skin color, texture, turgor normal. No rashes or lesions Neurologic: Grossly normal  EKG NSR, RBBB, LAFB  ASSESSMENT AND PLAN:   Exertional dyspnea Concerning for angina equivalent   CAD S/P percutaneous coronary angioplasty LAD stent 2014, RI PCI with DES 09/21/17  CKD (chronic  kidney disease) stage 3, GFR 30-59 ml/min (HCC) GFR 33-SCr 1.99  History of CVA (cerebrovascular accident) CVA 2015 treated with TPA- on chronic anticoagulation  Insulin dependent diabetes mellitus (HCC) IDDM with CRI  Paroxysmal atrial fibrillation (HCC) Holding NSR-on Eliquis  Ascending aortic aneurysm (HCC) 4.5 cm by CTA July 2019  History of allergy to radiographic contrast media This history is not clear but the patient has been pre medicated before procedures requiring contrast   PLAN  The patient was seen by Dr. Ellyn Hack and myself today in the office.  We feel the patient warrants diagnostic catheterization.  There was a 3-week period shortly after his stent in July where he was not taking Plavix, although he was on Eliquis. The patient understands that risks included but are not limited to stroke (1 in 1000), death (1 in 64), kidney failure [usually temporary] (1 in 500), bleeding (1 in 200), allergic reaction [possibly serious] (1 in 200).  The patient understands and agrees to proceed.    He will need to be admitted the night before his cath for hydration.  We are going to stop his lisinopril today and increase his Imdur to 60 mg.  The patient also does not really give a history of contrast allergy.  He is never had hives. I suspect this "allergy" was because of his renal insufficiency. Though it was not clear the patient he had a true contrast allergy he has been pre medicated before his CTA and his cath in July so I will order this again.   Kerin Ransom PA-C 01/27/2018 10:59 AM

## 2018-01-27 NOTE — Patient Instructions (Addendum)
Medication Instructions:  Your physician has recommended you make the following change in your medication:  1) HOLD Lisinopril starting today until further instructed 2) INCREASE Imdur to '60mg'$  daily 3) Take your last dose of Eliquis on Sunday evening 01/31/18 in anticipation of your procedure on 02/02/18  If you need a refill on your cardiac medications before your next appointment, please call your pharmacy.   Lab work: Risk manager today If you have labs (blood work) drawn today and your tests are completely normal, you will receive your results only by: [name] MyChart Message (if you have MyChart) OR . A paper copy in the mail If you have any lab test that is abnormal or we need to change your treatment, we will call you to review the results.  Testing/Procedures: Your physician has requested that you have a cardiac catheterization. Cardiac catheterization is used to diagnose and/or treat various heart conditions. Doctors may recommend this procedure for a number of different reasons. The most common reason is to evaluate chest pain. Chest pain can be a symptom of coronary artery disease (CAD), and cardiac catheterization can show whether plaque is narrowing or blocking your heart's arteries. This procedure is also used to evaluate the valves, as well as measure the blood flow and oxygen levels in different parts of your heart. For further information please visit [email or web address]. Please follow instruction sheet, as given.   Follow-Up: At [facility], you and your health needs are our priority.  As part of our continuing mission to provide you with exceptional heart care, we have created designated Provider Care Teams.  These Care Teams include your primary Cardiologist (physician) and Advanced Practice Providers (APPs -  Physician Assistants and Nurse Practitioners) who all work together to provide you with the care you need, when you need it. You will need a follow up appointment after  your procedure.     You may see Kirk Ruths, MD or one of the following Advanced Practice Providers on your designated Care Team:   Kerin Ransom, PA-C Roby Lofts, [place] . Sande Rives, PA-C  Any Other Special Instructions Will Be Listed Below (If Applicable).    [place] [address] Dept: [phone] Loc: [place]  01/27/2018  You are scheduled for a Cardiac Catheterization on Tuesday, December 10 '@9'$ :30am with Dr. Glenetta Hew.  1. Please arrive at the [facility] (Main Entrance A) at [facility]: [address] [place], [place] [address] . You will need to be admitted the day before your procedure 02/01/18 for hydration. Patient placement will contact you directly with a bed assignment and when to report to the hospital  Special note: Every effort is made to have your procedure done on time. Please understand that emergencies sometimes delay scheduled procedures.  2. Diet: Do not eat solid foods after midnight.  The patient may have clear liquids until 5am upon the day of the procedure.  3. Labs: You will need to have blood drawn today Bmet and Cbc  4. Medication instructions in preparation for your procedure:   Contrast Allergy: Yes (prophylactic instructions placed in Hospital orders)     Current Outpatient Medications (Endocrine & Metabolic):  [name]  Dapagliflozin Propanediol (FARXIGA PO), Take 5 mg by mouth. .  Insulin Glargine (LANTUS SOLOSTAR) 100 UNIT/ML Solostar Pen, Inject 45 Units into the skin daily. [name]  JANUVIA 100 MG tablet, TAKE 1 TABLET(100 MG)  BY MOUTH DAILY  Current Outpatient Medications (Cardiovascular):  .  amLODipine (NORVASC) 10 MG tablet, Take 1 tablet (10 mg total) by mouth daily. [name]  atorvastatin (LIPITOR) 80 MG tablet, Take 1 tablet (80 mg total) by mouth daily at 6 PM. .  carvedilol (COREG) 25 MG tablet,  Take 1 tablet (25 mg total) by mouth 2 (two) times daily. .  isosorbide mononitrate (IMDUR) 30 MG 24 hr tablet, Take 1 tablet (30 mg total) by mouth daily. [name]  lisinopril (PRINIVIL,ZESTRIL) 40 MG tablet, Take 40 mg by mouth daily. .  nitroGLYCERIN (NITROSTAT) 0.4 MG SL tablet, Place 1 tablet (0.4 mg total) under the tongue every 5 (five) minutes as needed for chest pain. Do not take more than 3 nitroglycerine .  hydrALAZINE (APRESOLINE) 25 MG tablet, Take 1 tablet (25 mg total) by mouth 2 (two) times daily.  Current Outpatient Medications (Respiratory):  .  albuterol (PROVENTIL HFA;VENTOLIN HFA) 108 (90 Base) MCG/ACT inhaler, Inhale 2 puffs into the lungs every 6 (six) hours as needed for wheezing or shortness of breath.  Current Outpatient Medications (Analgesics):  .  allopurinol (ZYLOPRIM) 100 MG tablet, TAKE TWO TABLETS BY MOUTH ONCE DAILY  Current Outpatient Medications (Hematological):  .  apixaban (ELIQUIS) 5 MG TABS tablet, Take 1 tablet (5 mg total) by mouth 2 (two) times daily. .  clopidogrel (PLAVIX) 75 MG tablet, Take 1 tablet (75 mg total) by mouth daily with breakfast.  Current Outpatient Medications (Other):  .  Blood Glucose Monitoring Suppl (ONETOUCH VERIO) w/Device KIT, 1 Device by Does not apply route 2 (two) times daily. E11.9 .  glucose blood test strip, Check BS bid DX: E11.9 .  Insulin Syringe-Needle U-100 (SAFETY INSULIN SYRINGES) 30G X 5/16" 0.5 ML MISC, As needed for insulin *For reference purposes while preparing patient instructions.   Delete this med list prior to printing instructions for patient.*  Stop taking Eliquis (Apixiban) on Sunday, December 8.  HOLD Lisinopril starting today 01/27/18 until further instructed  Take on 1/2 the dose of your insulin the night before    On the morning of your procedure, take your Plavix/Clopidogrel and any morning medicines NOT listed above.  You may use sips of water.  5. Plan for one night stay--bring personal  belongings. 6. Bring a current list of your medications and current insurance cards. 7. You MUST have a responsible person to drive you home. 8. Someone MUST be with you the first 24 hours after you arrive home or your discharge will be delayed. 9. Please wear clothes that are easy to get on and off and wear slip-on shoes.  Thank you for allowing [place] to care for you!   -- [place] [facility]

## 2018-01-27 NOTE — ED Notes (Signed)
  Patient transitioned from Bip[place] to [place] 2L per Dr. Kathrynn Humble  Patient tolerating well.

## 2018-01-27 NOTE — Assessment & Plan Note (Signed)
Concerning for angina equivalent

## 2018-01-27 NOTE — Assessment & Plan Note (Signed)
This history is not clear but the patient has been pre medicated before procedures requiring contrast

## 2018-01-27 NOTE — Assessment & Plan Note (Signed)
CVA 2015 treated with TPA- on chronic anticoagulation

## 2018-01-27 NOTE — Telephone Encounter (Signed)
Called and spoke to pt regarding his upcoming appt on tomorrow for cardiac rehab orientation. Noted that pt was seen in the office today for concerning dyspnea which is similar to his symptoms prior to his DES to ramus in July.  As a result of today's assessment with Kerin Ransom PA with Dr. Stanford Breed recommended that pt have left heart cath on !2/10.  Advised pt that due to his upcoming procedure, he would need to hold on starting cardiac rehab.  Informed pt that I would follow up with him after his cath is completed.  Pt verbalized understanding and is in agreement of the plan.Cherre Huger, BSN Cardiac and Training and development officer

## 2018-01-27 NOTE — Assessment & Plan Note (Signed)
IDDM with CRI 

## 2018-01-27 NOTE — ED Triage Notes (Signed)
To ED for sudden SOB - started about an hour pta. No hx of asthma per pt. Unable to speak sentences. Wheezing autible

## 2018-01-27 NOTE — Assessment & Plan Note (Signed)
LAD stent 2014, RI PCI with DES 09/21/17

## 2018-01-27 NOTE — Assessment & Plan Note (Signed)
4.5 cm by CTA July 2019

## 2018-01-27 NOTE — ED Provider Notes (Signed)
[name] Provider Note   CSN: [id number] Arrival date & time: 01/27/18  1800     History   Chief Complaint No chief complaint on file.   HPI James Mcclain is a 72 y.o. male.  HPI  72 year old male comes in with chief complaint of shortness of breath. Patient has history of diastolic CHF, A. fib, dilated aortic root, coronary artery disease with a catheterization that is scheduled for next week, CKD.  Patient reports that he has been having shortness of breath over the past few days.  This evening his shortness of breath acutely got worse and he called 911.  Patient arrives to the ED in respiratory distress with tachypnea and wheezing.  He denies any history of COPD, asthma, PE.  Patient has been having cough for the past few days but it has mostly been dry.  He denies any fevers, URI or flulike symptoms.  Patient has been taking his medications as prescribed.  At the moment he has no chest pain.  Past Medical History:  Diagnosis Date  . Arthritis    "right hand" (09/21/2017)  . CHF (congestive heart failure) ([place])   . CKD (chronic kidney disease) stage 3, GFR 30-59 ml/min (HCC)   . Coronary artery disease    a. s/p Xience DES to the LAD 07/2012. b. 09/21/17 Synergy stent to RI.   . Dilated aortic root ([place])    a. Mildly dilated by echo 09/2013, f/u MRA scheduled for 12/2014.  [name] Elevated PSA   . Essential hypertension   . Gout    "on daily RX" (09/21/2017)  . Heart murmur   . History of kidney stones   . Hyperlipidemia   . Insulin dependent diabetes mellitus ([place])   . 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 On tele 09/2013.  [name] Paroxysmal atrial fibrillation (HCC)   . PFO (patent foramen ovale)    a. By echo 09/2013.  [name] Polycythemia   . Prostate cancer ([place]) 07/15/12   Gleason 7, external beam radiation Tx, Dr. Pilar Jarvis  . PVC's (premature ventricular contractions)   . Stroke [facility])    a. Cryptogenic, 09/2013, event monitor with NSR.  Small PFO noted on echo but neg LE duplex.    Patient Active Problem List   Diagnosis Date Noted  . Ascending aortic aneurysm ([place]) 01/27/2018  . History of allergy to radiographic contrast media 01/27/2018  . Chronic anticoagulation 01/26/2018  . Atypical chest pain   . Dizziness   . New-onset angina ([place]) 09/21/2017  . History of snoring 08/05/2017  . Neoplastic malignant related fatigue 08/05/2017  . 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 CKD (chronic kidney disease) stage 3, GFR 30-59 ml/min (HCC)   . Paroxysmal atrial fibrillation ([place]) 07/30/2015  . PFO (patent foramen ovale)   . Dilated aortic root ([place])   . PVC's (premature ventricular contractions)   . Insulin dependent diabetes mellitus ([place])   . Multifocal atrial tachycardia (HCC)   . Polycythemia 05/16/2014  . History of CVA (cerebrovascular accident) 01/02/2014  . 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 Essential hypertension 11/28/2013  . Hyperlipidemia 11/28/2013  . Cerebral infarction due to embolism of left middle cerebral artery ([place]) 11/28/2013  . CAD S/P percutaneous coronary angioplasty 12/17/2012  . Prostate cancer ([place])   . 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 Exertional dyspnea 06/23/2012  . Murmur 06/23/2012  . DM2 (diabetes mellitus, type 2) ([place])   .  Elevated PSA     Past Surgical History:  Procedure Laterality Date  . COLONOSCOPY WITH PROPOFOL N/A 02/22/2016   Procedure: COLONOSCOPY WITH PROPOFOL;  Surgeon: Carol Ada, MD;  Location: [facility];  Service: Endoscopy;  Laterality: N/A;  . CORONARY ANGIOPLASTY WITH STENT PLACEMENT  08/05/2012   LAD, 1 stent  . CORONARY STENT INTERVENTION N/A 09/21/2017   Procedure: CORONARY STENT INTERVENTION;  Surgeon: Belva Crome, MD;  Location: [place] CV LAB;  Service: Cardiovascular;  Laterality: N/A;  . EP IMPLANTABLE DEVICE N/A 08/23/2014   Procedure: Loop Recorder Insertion;   Surgeon: Evans Lance, MD;  Location: [facility] CV LAB;  Service: Cardiovascular;  Laterality: N/A;  . INSERTION PROSTATE RADIATION SEED    . JOINT REPLACEMENT    . LEFT HEART CATH AND CORONARY ANGIOGRAPHY N/A 09/21/2017   Procedure: LEFT HEART CATH AND CORONARY ANGIOGRAPHY;  Surgeon: Belva Crome, MD;  Location: [facility] CV LAB;  Service: Cardiovascular;  Laterality: N/A;  . LITHOTRIPSY  2006  . PERCUTANEOUS CORONARY STENT INTERVENTION (PCI-S) N/A 08/05/2012   Procedure: PERCUTANEOUS CORONARY STENT INTERVENTION (PCI-S);  Surgeon: Sherren Mocha, MD;  Location: [facility] CATH LAB;  Service: Cardiovascular;  Laterality: N/A;  . PROSTATE BIOPSY  07/15/2012; 09/23/2013  . TEE WITHOUT CARDIOVERSION N/A 09/27/2013   Procedure: TRANSESOPHAGEAL ECHOCARDIOGRAM (TEE);  Surgeon: Larey Dresser, MD;  Location: [facility];  Service: Cardiovascular;  Laterality: N/A;  . TONSILLECTOMY  1990s  . TOTAL KNEE ARTHROPLASTY  01/24/2011   Procedure: TOTAL KNEE ARTHROPLASTY;  Surgeon: Alta Corning;  Location: [facility];  Service: Orthopedics;  Laterality: Left;  COMPUTER ASSISTED LEFT  TOTAL KNEE REPLACEMENT. Anesthesia a combination of regional and general.        Home Medications    Prior to Admission medications   Medication Sig Start Date End Date Taking? Authorizing Provider  albuterol (PROVENTIL HFA;VENTOLIN HFA) 108 (90 Base) MCG/ACT inhaler Inhale 2 puffs into the lungs every 6 (six) hours as needed for wheezing or shortness of breath. 08/28/17   Susy Frizzle, MD  allopurinol (ZYLOPRIM) 100 MG tablet TAKE TWO TABLETS BY MOUTH ONCE DAILY 02/26/17   Susy Frizzle, MD  amLODipine (NORVASC) 10 MG tablet Take 1 tablet (10 mg total) by mouth daily. 09/19/16   Susy Frizzle, MD  apixaban (ELIQUIS) 5 MG TABS tablet Take 1 tablet (5 mg total) by mouth 2 (two) times daily. 01/18/18   Lelon Perla, MD  atorvastatin (LIPITOR) 80 MG tablet Take 1 tablet (80 mg total) by mouth daily at 6 PM. 09/22/17    Daune Perch, NP  Blood Glucose Monitoring Suppl (ONETOUCH VERIO) w/Device KIT 1 Device by Does not apply route 2 (two) times daily. E11.9 08/28/16   Susy Frizzle, MD  carvedilol (COREG) 25 MG tablet Take 1 tablet (25 mg total) by mouth 2 (two) times daily. 09/22/17 09/22/18  Daune Perch, NP  clopidogrel (PLAVIX) 75 MG tablet Take 1 tablet (75 mg total) by mouth daily with breakfast. 11/12/17   Lelon Perla, MD  Dapagliflozin Propanediol (FARXIGA PO) Take 5 mg by mouth.    [provider]  glucose blood test strip Check BS bid DX: E11.9 08/28/16   Susy Frizzle, MD  hydrALAZINE (APRESOLINE) 25 MG tablet Take 1 tablet (25 mg total) by mouth 2 (two) times daily. 10/07/17 01/05/18  Almyra Deforest, PA  Insulin Glargine (LANTUS SOLOSTAR) 100 UNIT/ML Solostar Pen Inject 45 Units into the skin daily. 08/18/16   Jenna Luo  T, MD  Insulin Syringe-Needle U-100 (SAFETY INSULIN SYRINGES) 30G X 5/16" 0.5 ML MISC As needed for insulin 06/11/17   Susy Frizzle, MD  isosorbide mononitrate (IMDUR) 60 MG 24 hr tablet Take 1 tablet (60 mg total) by mouth daily. 01/27/18 01/27/19  Erlene Quan, PA-C  JANUVIA 100 MG tablet TAKE 1 TABLET(100 MG) BY MOUTH DAILY 10/05/17   Susy Frizzle, MD  lisinopril (PRINIVIL,ZESTRIL) 40 MG tablet Take 40 mg by mouth daily.    [provider]  nitroGLYCERIN (NITROSTAT) 0.4 MG SL tablet Place 1 tablet (0.4 mg total) under the tongue every 5 (five) minutes as needed for chest pain. Do not take more than 3 nitroglycerine 10/23/17 10/23/18  Almyra Deforest, PA    Family History Family History  Problem Relation Age of Onset  . Leukemia Mother   . Cancer Mother   . Heart Problems Father   . Diabetes Sister        ? father  . Heart Problems Brother   . Heart Problems Unknown        family history  . Stroke Unknown   . Stroke Brother   . Hypertension Brother   . Cervical cancer Sister        ????  . Heart attack Neg Hx     Social History Social  History   Tobacco Use  . Smoking status: Never Smoker  . Smokeless tobacco: Never Used  Substance Use Topics  . Alcohol use: Not Currently  . Drug use: Never     Allergies   Betadine [povidone iodine]; Contrast media [iodinated diagnostic agents]; and Fish allergy   Review of Systems Review of Systems  Constitutional: Positive for activity change. Negative for fever.  Respiratory: Positive for cough and shortness of breath.   Cardiovascular: Negative for chest pain.  Gastrointestinal: Negative for nausea and vomiting.  Allergic/Immunologic: Negative for immunocompromised state.  Hematological: Bruises/bleeds easily.  All other systems reviewed and are negative.    Physical Exam Updated Vital Signs BP (!) 159/74   Pulse 93   Temp 98.4 F (36.9 C) (Oral)   Resp (!) 25   SpO2 99%   Physical Exam  Constitutional: He is oriented to person, place, and time. He appears well-developed.  HENT:  Head: Atraumatic.  Eyes: EOM are normal.  Neck: Neck supple.  Cardiovascular: Normal rate.  Pulmonary/Chest: Stridor present. He is in respiratory distress. He has wheezes. He has no rales.  Patient is noted to be in respiratory distress with audible wheezing. There is no focal rhonchi, rales and no wheezing on the lung exam.  Musculoskeletal: He exhibits edema.  Left lower extremity does appear slightly edematous  Neurological: He is alert and oriented to person, place, and time.  Skin: Skin is warm.  Nursing note and vitals reviewed.    ED Treatments / Results  Labs (all labs ordered are listed, but only abnormal results are displayed) Labs Reviewed  BASIC METABOLIC PANEL - Abnormal; Notable for the following components:      Result Value   Glucose, Bld 117 (*)    BUN 26 (*)    Creatinine, Ser 1.81 (*)    Calcium 8.7 (*)    GFR calc non Af Amer 37 (*)    GFR calc Af Amer 43 (*)    All other components within normal limits  CBC WITH DIFFERENTIAL/PLATELET - Abnormal;  Notable for the following components:   WBC 15.9 (*)    RBC 8.16 (*)  HCT 61.3 (*)    MCV 75.1 (*)    MCH 20.0 (*)    MCHC 26.6 (*)    RDW 24.3 (*)    Platelets 407 (*)    nRBC 0.6 (*)    Neutro Abs 13.6 (*)    All other components within normal limits  TROPONIN I - Abnormal; Notable for the following components:   Troponin I 0.04 (*)    All other components within normal limits  BRAIN NATRIURETIC PEPTIDE - Abnormal; Notable for the following components:   B Natriuretic Peptide 322.6 (*)    All other components within normal limits  CULTURE, BLOOD (ROUTINE X 2)  CULTURE, BLOOD (ROUTINE X 2)  PROTIME-INR  URINALYSIS, ROUTINE W REFLEX MICROSCOPIC  I-STAT TROPONIN, ED  I-STAT CG4 LACTIC ACID, ED    EKG EKG Interpretation  Date/Time:  Wednesday January 27 2018 18:09:58 EST Ventricular Rate:  103 PR Interval:  172 QRS Duration: 122 QT Interval:  378 QTC Calculation: 495 R Axis:   -63 Text Interpretation:  Sinus tachycardia Possible Left atrial enlargement Left axis deviation Right bundle branch block Abnormal ECG No acute changes No significant change since last tracing Nonspecific ST and T wave abnormality Confirmed by Varney Biles ([id number]) on 01/27/2018 8:33:01 PM   Radiology Dg Chest Port 1 View  Result Date: 01/27/2018 CLINICAL DATA:  Shortness of breath. EXAM: PORTABLE CHEST 1 VIEW COMPARISON:  January 04, 2018 FINDINGS: Mild increased hazy opacity in left mid lower lung, asymmetric to the right. No pneumothorax. No nodule or mass. Mild cardiomegaly is stable. No other acute abnormalities. IMPRESSION: Mild hazy opacity in the left mid lower lung may represent asymmetric edema versus subtle infiltrate. Recommend clinical correlation and follow-up to resolution. Electronically Signed   By: Dorise Bullion III M.D   On: 01/27/2018 19:19    Procedures .Critical Care Performed by: Varney Biles, MD Authorized by: Varney Biles, MD   Critical care provider  statement:    Critical care time (minutes):  45   Critical care start time:  01/27/2018 6:49 PM   Critical care end time:  01/27/2018 8:49 PM   Critical care was time spent personally by me on the following activities:  Evaluation of patient's response to treatment, examination of patient, ordering and performing treatments and interventions, ordering and review of laboratory studies, ordering and review of radiographic studies, pulse oximetry, re-evaluation of patient's condition, obtaining history from patient or surrogate, review of old charts, ventilator management and development of treatment plan with patient or surrogate   (including critical care time)  Medications Ordered in ED Medications  cefTRIAXone (ROCEPHIN) 2 g in sodium chloride 0.9 % 100 mL IVPB (has no administration in time range)  azithromycin (ZITHROMAX) 500 mg in sodium chloride 0.9 % 250 mL IVPB (has no administration in time range)     Initial Impression / Assessment and Plan / ED Course  I have reviewed the triage vital signs and the nursing notes.  Pertinent labs & imaging results that were available during my care of the patient were reviewed by me and considered in my medical decision making (see chart for details).  Clinical Course as of Jan 28 2048  Wed Jan 27, 2018  2044 On exam patient's lungs were clear and there were no rales.  White count is elevated, which is nonspecific.  Given the cough and the gradually worsening shortness of breath that acutely got worse, pneumonia is a small possibility.  For now we will start him on  antibiotics.  DG Chest Port 1 View [AN]  2045 Patient reassessed.  He is looking a lot more comfortable with the BiPAP on.  Still tachypneic.  BNP is marginally elevated.  Lactic acid is normal.  Troponin is detectable at 0.04. Still evolving case -CHF versus infection, with PE and ACS being less likely as the etiology. We will admit.  We will try to remove him a BiPAP and see how he  does.    [AN]    Clinical Course User Index [AN] Varney Biles, MD    72 year old male comes in with chief complaint of shortness of breath.  He has multiple medical comorbidities including coronary artery disease, CHF, aortic root dilatation, CKD.  He does not have any known lung disease.  Patient arrives in respiratory distress with audible wheezing.  On lung exam he does not have any focal findings, including no wheezing or rales.  Patient's blood pressure is noted to be elevated -but that could be because of sympathomimetic response to dyspnea.  Differential diagnoses includes acute CHF, worsening of the aortic root dilatation, ACS, PE, pneumonia, pericardial effusion.  Plan is to place patient on BiPAP and reassess.  Final Clinical Impressions(s) / ED Diagnoses   Final diagnoses:  Acute respiratory distress    ED Discharge Orders    None       Varney Biles, MD 01/27/18 2051

## 2018-01-27 NOTE — Assessment & Plan Note (Signed)
GFR 33-SCr 1.99

## 2018-01-27 NOTE — H&P (Signed)
History and Physical    HOLLIE BARTUS LKG:[id number] DOB: Feb 22, 1946 DOA: 01/27/2018  PCP: [name], FNP   Patient coming from: Home   Chief Complaint: SOB  HPI: TOWNSEND CUDWORTH is a 72 y.o. male with medical history significant for hypertension, insulin-dependent diabetes mellitus, chronic kidney disease stage III, coronary artery disease with stents, polycythemia managed with phlebotomy, now presenting to the emergency department with acute respiratory distress.  Patient reports approximately 1 month of exertional dyspnea, worsened acutely today.  He also developed a cough today, occasionally productive of clear and white sputum.  He was seen by his cardiologist in the clinic, there was concern that his recent dyspnea could be an anginal equivalent, and he is scheduled for upcoming diagnostic catheterization.  He has not noted any fevers or chills, denies any chest pain, and denies lower extremity swelling or tenderness.  He reports strict adherence with his Eliquis with no missed doses.  Has had some mild rhinorrhea and sore throat recently, but denies sick contacts.  ED Course: Upon arrival to the ED, patient is found to be afebrile, saturating adequately on BiPAP, tachypneic, slightly tachycardic, and with blood pressure 210/100.  EKG features sinus tachycardia with rate 103, LAD, RBBB, and nonspecific ST-T abnormalities that appears similar to prior.  Chest x-ray is notable for mild hazy opacity in the left lower lung, possibly reflecting asymmetric edema or pneumonia.  Chemistry panel is notable for creatinine 1.81, similar to priors.  CBC features a leukocytosis to 15,900 and stable chronic polycythemia.  Lactic acid is normal, troponin slightly elevated, and BNP slightly elevated.  Blood cultures were collected and the patient was treated with Rocephin, azithromycin, and BiPAP in the ED.  He has improved while in the emergency department, is being transitioned off of BiPAP, and will be  admitted for ongoing evaluation and management.  Review of Systems:  All other systems reviewed and apart from HPI, are negative.  Past Medical History:  Diagnosis Date  . Arthritis    "right hand" (09/21/2017)  . CHF (congestive heart failure) ([place])   . CKD (chronic kidney disease) stage 3, GFR 30-59 ml/min (HCC)   . Coronary artery disease    a. s/p Xience DES to the LAD 07/2012. b. 09/21/17 Synergy stent to RI.   . Dilated aortic root ([place])    a. Mildly dilated by echo 09/2013, f/u MRA scheduled for 12/2014.  [name] Elevated PSA   . Essential hypertension   . Gout    "on daily RX" (09/21/2017)  . Heart murmur   . History of kidney stones   . Hyperlipidemia   . Insulin dependent diabetes mellitus ([place])   . 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 On tele 09/2013.  [name] Paroxysmal atrial fibrillation (HCC)   . PFO (patent foramen ovale)    a. By echo 09/2013.  [name] Polycythemia   . Prostate cancer ([place]) 07/15/12   Gleason 7, external beam radiation Tx, Dr. Pilar Jarvis  . PVC's (premature ventricular contractions)   . Stroke [facility])    a. Cryptogenic, 09/2013, event monitor with NSR. Small PFO noted on echo but neg LE duplex.    Past Surgical History:  Procedure Laterality Date  . COLONOSCOPY WITH PROPOFOL N/A 02/22/2016   Procedure: COLONOSCOPY WITH PROPOFOL;  Surgeon: Carol Ada, MD;  Location: [facility];  Service: Endoscopy;  Laterality: N/A;  . CORONARY ANGIOPLASTY WITH STENT PLACEMENT  08/05/2012   LAD, 1 stent  . CORONARY STENT INTERVENTION N/A 09/21/2017  Procedure: CORONARY STENT INTERVENTION;  Surgeon: Belva Crome, MD;  Location: [place] [facility];  Service: Cardiovascular;  Laterality: N/A;  . EP IMPLANTABLE DEVICE N/A 08/23/2014   Procedure: Loop Recorder Insertion;  Surgeon: Evans Lance, MD;  Location: [place] [facility];  Service: Cardiovascular;  Laterality: N/A;  . INSERTION PROSTATE RADIATION SEED    . JOINT REPLACEMENT    . LEFT HEART CATH AND CORONARY ANGIOGRAPHY N/A  09/21/2017   Procedure: LEFT HEART CATH AND CORONARY ANGIOGRAPHY;  Surgeon: Belva Crome, MD;  Location: [place] [facility];  Service: Cardiovascular;  Laterality: N/A;  . LITHOTRIPSY  2006  . PERCUTANEOUS CORONARY STENT INTERVENTION (PCI-S) N/A 08/05/2012   Procedure: PERCUTANEOUS CORONARY STENT INTERVENTION (PCI-S);  Surgeon: Sherren Mocha, MD;  Location: [facility] CATH LAB;  Service: Cardiovascular;  Laterality: N/A;  . PROSTATE BIOPSY  07/15/2012; 09/23/2013  . TEE WITHOUT CARDIOVERSION N/A 09/27/2013   Procedure: TRANSESOPHAGEAL ECHOCARDIOGRAM (TEE);  Surgeon: Larey Dresser, MD;  Location: [place];  Service: Cardiovascular;  Laterality: N/A;  . TONSILLECTOMY  1990s  . TOTAL KNEE ARTHROPLASTY  01/24/2011   Procedure: TOTAL KNEE ARTHROPLASTY;  Surgeon: Alta Corning;  Location: [place];  Service: Orthopedics;  Laterality: Left;  COMPUTER ASSISTED LEFT  TOTAL KNEE REPLACEMENT. Anesthesia a combination of regional and general.     reports that he has never smoked. He has never used smokeless tobacco. He reports that he drank alcohol. He reports that he does not use drugs.  Allergies  Allergen Reactions  . Betadine [Povidone Iodine] Other (See Comments)    Patient doesn't remember  . Contrast Media [Iodinated Diagnostic Agents] Other (See Comments)    Patient doesn't remember  . Fish Allergy Swelling    Family History  Problem Relation Age of Onset  . Leukemia Mother   . Cancer Mother   . Heart Problems Father   . Diabetes Sister        ? father  . Heart Problems Brother   . Heart Problems Unknown        family history  . Stroke Unknown   . Stroke Brother   . Hypertension Brother   . Cervical cancer Sister        ????  . Heart attack Neg Hx      Prior to Admission medications   Medication Sig Start Date End Date Taking? Authorizing Provider  albuterol (PROVENTIL HFA;VENTOLIN HFA) 108 (90 Base) MCG/ACT inhaler Inhale 2 puffs into the lungs every 6 (six) hours as needed for  wheezing or shortness of breath. 08/28/17   Susy Frizzle, MD  allopurinol (ZYLOPRIM) 100 MG tablet TAKE TWO TABLETS BY MOUTH ONCE DAILY 02/26/17   Susy Frizzle, MD  amLODipine (NORVASC) 10 MG tablet Take 1 tablet (10 mg total) by mouth daily. 09/19/16   Susy Frizzle, MD  apixaban (ELIQUIS) 5 MG TABS tablet Take 1 tablet (5 mg total) by mouth 2 (two) times daily. 01/18/18   Lelon Perla, MD  atorvastatin (LIPITOR) 80 MG tablet Take 1 tablet (80 mg total) by mouth daily at 6 PM. 09/22/17   Daune Perch, NP  Blood Glucose Monitoring Suppl (ONETOUCH VERIO) w/Device KIT 1 Device by Does not apply route 2 (two) times daily. E11.9 08/28/16   Susy Frizzle, MD  carvedilol (COREG) 25 MG tablet Take 1 tablet (25 mg total) by mouth 2 (two) times daily. 09/22/17 09/22/18  Daune Perch, NP  clopidogrel (PLAVIX) 75 MG tablet Take 1 tablet (  75 mg total) by mouth daily with breakfast. 11/12/17   Lelon Perla, MD  Dapagliflozin Propanediol (FARXIGA PO) Take 5 mg by mouth.    [provider]  glucose blood test strip Check BS bid DX: E11.9 08/28/16   Susy Frizzle, MD  hydrALAZINE (APRESOLINE) 25 MG tablet Take 1 tablet (25 mg total) by mouth 2 (two) times daily. 10/07/17 01/05/18  Almyra Deforest, PA  Insulin Glargine (LANTUS SOLOSTAR) 100 UNIT/ML Solostar Pen Inject 45 Units into the skin daily. 08/18/16   Susy Frizzle, MD  Insulin Syringe-Needle U-100 (SAFETY INSULIN SYRINGES) 30G X 5/16" 0.5 ML MISC As needed for insulin 06/11/17   Susy Frizzle, MD  isosorbide mononitrate (IMDUR) 60 MG 24 hr tablet Take 1 tablet (60 mg total) by mouth daily. 01/27/18 01/27/19  Erlene Quan, PA-C  JANUVIA 100 MG tablet TAKE 1 TABLET(100 MG) BY MOUTH DAILY 10/05/17   Susy Frizzle, MD  lisinopril (PRINIVIL,ZESTRIL) 40 MG tablet Take 40 mg by mouth daily.    [provider]  nitroGLYCERIN (NITROSTAT) 0.4 MG SL tablet Place 1 tablet (0.4 mg total) under the tongue every 5 (five) minutes  as needed for chest pain. Do not take more than 3 nitroglycerine 10/23/17 10/23/18  Almyra Deforest, [place]    Physical Exam: Vitals:   01/27/18 1900 01/27/18 1945 01/27/18 2045 01/27/18 2100  BP: (!) 153/76 (!) 159/74 132/62   Pulse: 95 93 97 98  Resp: (!) 26 (!) 25 (!) 22 (!) 21  Temp:      TempSrc:      SpO2: 100% 99% 99% 97%    Constitutional: Not in acute distress, mild tachypnea and dyspnea  Eyes: PERTLA, lids and conjunctivae normal ENMT: Mucous membranes are moist. Posterior pharynx clear of any exudate or lesions.   Neck: normal, supple, no masses, no thyromegaly Respiratory: Mild tachypnea, dyspnea with speech. No pallor or cyanosis.  Cardiovascular: S1 & S2 heard, regular rate and rhythm. Trace lower leg swelling. Abdomen: No distension, no tenderness, soft. Bowel sounds normal.  Musculoskeletal: no clubbing / cyanosis. No joint deformity upper and lower extremities.   Skin: no significant rashes, lesions, ulcers. Warm, dry, well-perfused. Neurologic: No facial asymmetry. Sensation intact. Moving all extremities.  Psychiatric: Alert and oriented x 3. Pleasant, cooperative.    Labs on Admission: I have personally reviewed following labs and imaging studies  CBC: Recent Labs  Lab 01/27/18 1050 01/27/18 1845  WBC WILL FOLLOW 15.9*  NEUTROABS WILL FOLLOW 13.6*  HGB WILL FOLLOW 16.3  HCT WILL FOLLOW 61.3*  MCV WILL FOLLOW 75.1*  PLT WILL FOLLOW 782*   Basic Metabolic Panel: Recent Labs  Lab 01/27/18 1050 01/27/18 1845  NA 142 142  K 4.6 4.1  CL 105 108  CO2 21 25  GLUCOSE 61* 117*  BUN 28* 26*  CREATININE 1.80* 1.81*  CALCIUM 8.7 8.7*   GFR: Estimated Creatinine Clearance: 46.6 mL/min (A) (by C-G formula based on SCr of 1.81 mg/dL (H)). Liver Function Tests: No results for input(s): AST, ALT, ALKPHOS, BILITOT, PROT, ALBUMIN in the last 168 hours. No results for input(s): LIPASE, AMYLASE in the last 168 hours. No results for input(s): AMMONIA in the last 168  hours. Coagulation Profile: Recent Labs  Lab 01/27/18 1845  INR 1.19   Cardiac Enzymes: Recent Labs  Lab 01/27/18 1845  TROPONINI 0.04*   BNP (last 3 results) No results for input(s): PROBNP in the last 8760 hours. HbA1C: No results for input(s): HGBA1C in  the last 72 hours. CBG: No results for input(s): GLUCAP in the last 168 hours. Lipid Profile: No results for input(s): CHOL, HDL, LDLCALC, TRIG, CHOLHDL, LDLDIRECT in the last 72 hours. Thyroid Function Tests: No results for input(s): TSH, T4TOTAL, FREET4, T3FREE, THYROIDAB in the last 72 hours. Anemia Panel: No results for input(s): VITAMINB12, FOLATE, FERRITIN, TIBC, IRON, RETICCTPCT in the last 72 hours. Urine analysis:    Component Value Date/Time   COLORURINE YELLOW 07/13/2015 [place] 07/13/2015 1217   LABSPEC 1.019 07/13/2015 1217   PHURINE 5.0 07/13/2015 1217   GLUCOSEU NEGATIVE 07/13/2015 1217   HGBUR TRACE (A) 07/13/2015 1217   BILIRUBINUR NEGATIVE 07/13/2015 1217   KETONESUR NEGATIVE 07/13/2015 1217   PROTEINUR 30 (A) 07/13/2015 1217   UROBILINOGEN 0.2 07/03/2014 1341   NITRITE NEGATIVE 07/13/2015 1217   LEUKOCYTESUR NEGATIVE 07/13/2015 1217   Sepsis Labs: _0 (procalcitonin:4,lacticidven:4) )No results found for this or any previous visit (from the past 240 hour(s)).   Radiological Exams on Admission: Dg Chest Port 1 View  Result Date: 01/27/2018 CLINICAL DATA:  Shortness of breath. EXAM: PORTABLE CHEST 1 VIEW COMPARISON:  January 04, 2018 FINDINGS: Mild increased hazy opacity in left mid lower lung, asymmetric to the right. No pneumothorax. No nodule or mass. Mild cardiomegaly is stable. No other acute abnormalities. IMPRESSION: Mild hazy opacity in the left mid lower lung may represent asymmetric edema versus subtle infiltrate. Recommend clinical correlation and follow-up to resolution. Electronically Signed   By: Dorise Bullion III M.D   On: 01/27/2018 19:19    EKG:  Independently reviewed. Sinus tachycardia (rate 103), LAD, RBBB, non-specific ST-T abnormalities, similar to prior.   Assessment/Plan   1. Acute respiratory distress; ?CAP   - Patient reports ~1 month of DOE, worsened acutely on day of admission  - He saw cardiology in clinic 12/4, there was concern that recent dyspnea could be anginal equivalent and he is scheduled for diagnostic cath in a few days  - He continues to deny chest pain, has developed mild cough, and CXR suggests a possible developing PNA in left lower lung  - He was requiring BiPAP in ED, since transitioned to nasal canula  - Blood cultures were collected in ED and he was started on Rocephin and azithromycin  - Check sputum culture, strep pneumo antigen, influenza PCR, continue current antibiotics, continue cardiac monitoring, trend troponin, and repeat EKG   2. CAD  - Has known CAD s/p PCI and planned for diagnostic cath in a few days for DOE  - No recent chest pain, but admitted with SOB (possibly from CAP, but anginal equivalent also considered)   - EKG features RBBB and non-specific ST-T abnormalities that do not appear changed from prior  - Troponin is 0.04 in ED  - Continue cardiac monitoring, trend troponin, repeat EKG, continue statin, beta-blocker, Imdur, Plavix    3. Paroxysmal atrial fibrillation  - In sinus rhythm on admission  - CHADS-VASc 6 (age, CVA x2, HTN, DM, CAD)  - Continue Eliquis and beta-blocker   4. CKD stage III  - SCr is 1.81 on admission, similar to priors  - Renally-dose medications    5. Polycythemia  - Counts appears stable  - Follows with oncology and managed with phlebotomy q50mo, last was ~1 month ago   6. Hypertension with hypertensive urgency  - BP 210/100 initially in ED, likely secondary to acute respiratory distress, improved on BiPAP  - Continue Norvasc, Coreg, hydralazine    7. History of CVA  -  Continue statin and antiplatelets   8. Insulin-dependent DM  - A1c was 7.3% in  July  - Managed at home with Wilder Glade and Lantus 45 units qD  - Check CBG's, hold Wilder Glade, continue Lantus with Novolog correctional while in hospital   DVT prophylaxis: Eliquis  Code Status: Full  Family Communication: Wife updated at bedside  Consults called: None  Admission status: Inpatient    Vianne Bulls, MD [facility] Pager [phone]  If 7PM-7AM, please contact night-coverage [email or web address] Password [email or web address]  01/27/2018, 9:15 PM

## 2018-01-27 NOTE — Assessment & Plan Note (Signed)
Holding NSR-on Eliquis

## 2018-01-28 ENCOUNTER — Observation Stay (HOSPITAL_COMMUNITY): Payer: Medicare HMO

## 2018-01-28 ENCOUNTER — Encounter (HOSPITAL_COMMUNITY): Payer: Self-pay | Admitting: *Deleted

## 2018-01-28 ENCOUNTER — Other Ambulatory Visit: Payer: Self-pay

## 2018-01-28 ENCOUNTER — Inpatient Hospital Stay (HOSPITAL_BASED_OUTPATIENT_CLINIC_OR_DEPARTMENT_OTHER): Payer: Medicare HMO

## 2018-01-28 ENCOUNTER — Inpatient Hospital Stay (HOSPITAL_COMMUNITY)
Admission: RE | Admit: 2018-01-28 | Discharge: 2018-01-28 | Disposition: A | Discharge status: Home / Self Care | Payer: Medicare HMO | Source: Ambulatory Visit

## 2018-01-28 DIAGNOSIS — I25110 Atherosclerotic heart disease of native coronary artery with unstable angina pectoris: Secondary | ICD-10-CM | POA: Diagnosis not present

## 2018-01-28 DIAGNOSIS — Z8673 Personal history of transient ischemic attack (TIA), and cerebral infarction without residual deficits: Secondary | ICD-10-CM | POA: Diagnosis not present

## 2018-01-28 DIAGNOSIS — R0603 Acute respiratory distress: Secondary | ICD-10-CM | POA: Diagnosis not present

## 2018-01-28 DIAGNOSIS — Z794 Long term (current) use of insulin: Secondary | ICD-10-CM | POA: Diagnosis not present

## 2018-01-28 DIAGNOSIS — I480 Paroxysmal atrial fibrillation: Secondary | ICD-10-CM | POA: Diagnosis not present

## 2018-01-28 DIAGNOSIS — N183 Chronic kidney disease, stage 3 (moderate): Secondary | ICD-10-CM | POA: Diagnosis not present

## 2018-01-28 DIAGNOSIS — R609 Edema, unspecified: Secondary | ICD-10-CM

## 2018-01-28 DIAGNOSIS — I160 Hypertensive urgency: Secondary | ICD-10-CM | POA: Diagnosis not present

## 2018-01-28 DIAGNOSIS — I130 Hypertensive heart and chronic kidney disease with heart failure and stage 1 through stage 4 chronic kidney disease, or unspecified chronic kidney disease: Secondary | ICD-10-CM | POA: Diagnosis not present

## 2018-01-28 DIAGNOSIS — E1122 Type 2 diabetes mellitus with diabetic chronic kidney disease: Secondary | ICD-10-CM | POA: Diagnosis not present

## 2018-01-28 DIAGNOSIS — I200 Unstable angina: Secondary | ICD-10-CM | POA: Diagnosis present

## 2018-01-28 DIAGNOSIS — J9811 Atelectasis: Secondary | ICD-10-CM | POA: Diagnosis not present

## 2018-01-28 LAB — CBC WITH DIFFERENTIAL/PLATELET
Band Neutrophils: 0 %
Basophils Absolute: 0.1 10*3/uL (ref 0.0–0.2)
Basophils Absolute: 0 10*3/uL (ref 0.0–0.1)
Basophils Relative: 0 %
Basos: 0 %
Blasts: 0 %
EOS (ABSOLUTE): 0.3 10*3/uL (ref 0.0–0.4)
Eos: 2 %
Eosinophils Absolute: 0.5 10*3/uL (ref 0.0–0.5)
Eosinophils Relative: 3 %
HCT: 51.3 % (ref 39.0–52.0)
Hematocrit: 53.7 % — ABNORMAL HIGH (ref 37.5–51.0)
Hemoglobin: 15.1 g/dL (ref 13.0–17.7)
Hemoglobin: 13.5 g/dL (ref 13.0–17.0)
Immature Grans (Abs): 0.1 10*3/uL (ref 0.0–0.1)
Immature Granulocytes: 1 %
Lymphocytes Absolute: 1.7 10*3/uL (ref 0.7–3.1)
Lymphocytes Relative: 8 %
Lymphs Abs: 1.4 10*3/uL (ref 0.7–4.0)
Lymphs: 11 %
MCH: 20.4 pg — ABNORMAL LOW (ref 26.6–33.0)
MCH: 19.8 pg — ABNORMAL LOW (ref 26.0–34.0)
MCHC: 28.1 g/dL — ABNORMAL LOW (ref 31.5–35.7)
MCHC: 26.3 g/dL — ABNORMAL LOW (ref 30.0–36.0)
MCV: 72 fL — ABNORMAL LOW (ref 79–97)
MCV: 75.3 fL — ABNORMAL LOW (ref 80.0–100.0)
Metamyelocytes Relative: 0 %
Monocytes Absolute: 1.1 10*3/uL — ABNORMAL HIGH (ref 0.1–0.9)
Monocytes Absolute: 0.7 10*3/uL (ref 0.1–1.0)
Monocytes Relative: 4 %
Monocytes: 7 %
Myelocytes: 0 %
Neutro Abs: 14.8 10*3/uL — ABNORMAL HIGH (ref 1.7–7.7)
Neutrophils Absolute: 12.6 10*3/uL — ABNORMAL HIGH (ref 1.4–7.0)
Neutrophils Relative %: 85 %
Neutrophils: 79 %
Other: 0 %
Platelets: 389 10*3/uL (ref 150–450)
Platelets: 323 10*3/uL (ref 150–400)
Promyelocytes Relative: 0 %
RBC: 7.42 x10E6/uL (ref 4.14–5.80)
RBC: 6.81 MIL/uL — ABNORMAL HIGH (ref 4.22–5.81)
RDW: 22.5 % — ABNORMAL HIGH (ref 12.3–15.4)
RDW: 23.6 % — ABNORMAL HIGH (ref 11.5–15.5)
WBC: 15.8 10*3/uL — ABNORMAL HIGH (ref 3.4–10.8)
WBC: 17.4 10*3/uL — ABNORMAL HIGH (ref 4.0–10.5)
nRBC: 0 /100 WBC
nRBC: 0.2 % (ref 0.0–0.2)

## 2018-01-28 LAB — URINALYSIS, ROUTINE W REFLEX MICROSCOPIC
Bacteria, UA: NONE SEEN
Bilirubin Urine: NEGATIVE
Glucose, UA: NEGATIVE mg/dL
Hgb urine dipstick: NEGATIVE
Ketones, ur: NEGATIVE mg/dL
Leukocytes, UA: NEGATIVE
Nitrite: NEGATIVE
Protein, ur: 100 mg/dL — AB
Specific Gravity, Urine: 1.015 (ref 1.005–1.030)
pH: 5 (ref 5.0–8.0)

## 2018-01-28 LAB — BASIC METABOLIC PANEL
Anion gap: 12 (ref 5–15)
BUN/Creatinine Ratio: 16 (ref 10–24)
BUN: 28 mg/dL — ABNORMAL HIGH (ref 8–27)
BUN: 29 mg/dL — ABNORMAL HIGH (ref 8–23)
CO2: 21 mmol/L (ref 20–29)
CO2: 22 mmol/L (ref 22–32)
Calcium: 8.7 mg/dL (ref 8.6–10.2)
Calcium: 7.8 mg/dL — ABNORMAL LOW (ref 8.9–10.3)
Chloride: 105 mmol/L (ref 96–106)
Chloride: 106 mmol/L (ref 98–111)
Creatinine, Ser: 1.8 mg/dL — ABNORMAL HIGH (ref 0.76–1.27)
Creatinine, Ser: 1.83 mg/dL — ABNORMAL HIGH (ref 0.61–1.24)
GFR calc Af Amer: 43 mL/min/{1.73_m2} — ABNORMAL LOW (ref >59)
GFR calc Af Amer: 42 mL/min — ABNORMAL LOW (ref >60)
GFR calc non Af Amer: 37 mL/min/{1.73_m2} — ABNORMAL LOW (ref >59)
GFR calc non Af Amer: 36 mL/min — ABNORMAL LOW (ref >60)
Glucose, Bld: 131 mg/dL — ABNORMAL HIGH (ref 70–99)
Glucose: 61 mg/dL — ABNORMAL LOW (ref 65–99)
Potassium: 4.6 mmol/L (ref 3.5–5.2)
Potassium: 4.4 mmol/L (ref 3.5–5.1)
Sodium: 142 mmol/L (ref 134–144)
Sodium: 140 mmol/L (ref 135–145)

## 2018-01-28 LAB — CBG MONITORING, ED
Glucose-Capillary: 119 mg/dL — ABNORMAL HIGH (ref 70–99)
Glucose-Capillary: 127 mg/dL — ABNORMAL HIGH (ref 70–99)

## 2018-01-28 LAB — TROPONIN I
Troponin I: 0.04 ng/mL (ref <0.03)
Troponin I: <0.03 ng/mL (ref <0.03)
Troponin I: <0.03 ng/mL (ref <0.03)

## 2018-01-28 LAB — STREP PNEUMONIAE URINARY ANTIGEN: Strep Pneumo Urinary Antigen: NEGATIVE

## 2018-01-28 LAB — GLUCOSE, CAPILLARY
Glucose-Capillary: 66 mg/dL — ABNORMAL LOW (ref 70–99)
Glucose-Capillary: 90 mg/dL (ref 70–99)
Glucose-Capillary: 123 mg/dL — ABNORMAL HIGH (ref 70–99)

## 2018-01-28 MED ORDER — PREDNISONE 20 MG PO TABS: 50.0000 mg | ORAL_TABLET | Freq: Every day | ORAL | Status: DC

## 2018-01-28 MED ORDER — SODIUM BICARBONATE 8.4 % IV SOLN: INTRAVENOUS | Status: DC

## 2018-01-28 MED ORDER — DIPHENHYDRAMINE HCL 25 MG PO CAPS: 50.0000 mg | ORAL_CAPSULE | Freq: Every day | ORAL | Status: DC

## 2018-01-28 MED ORDER — NITROGLYCERIN 0.4 MG SL SUBL: 0.4000 mg | SUBLINGUAL_TABLET | SUBLINGUAL | Status: DC | PRN

## 2018-01-28 MED ORDER — ZOLPIDEM TARTRATE 5 MG PO TABS: 5.0000 mg | ORAL_TABLET | Freq: Every evening | ORAL | Status: DC | PRN

## 2018-01-28 MED ORDER — ALLOPURINOL 100 MG PO TABS: 200.0000 mg | ORAL_TABLET | Freq: Every day | ORAL | Status: DC

## 2018-01-28 MED ORDER — APIXABAN 5 MG PO TABS
5.0000 mg | ORAL_TABLET | Freq: Two times a day (BID) | ORAL | Status: DC
  Administered 2018-01-29: 5 mg via ORAL
  Filled 2018-01-28: qty 1

## 2018-01-28 MED ORDER — ACETAMINOPHEN 325 MG PO TABS: 650.0000 mg | ORAL_TABLET | ORAL | Status: DC | PRN

## 2018-01-28 MED ORDER — ASPIRIN EC 81 MG PO TBEC
81.0000 mg | DELAYED_RELEASE_TABLET | Freq: Every day | ORAL | Status: DC
  Administered 2018-01-29: 81 mg via ORAL
  Filled 2018-01-28: qty 1

## 2018-01-28 MED ORDER — INSULIN GLARGINE 100 UNIT/ML ~~LOC~~ SOLN: 45.0000 [IU] | SUBCUTANEOUS | Status: DC

## 2018-01-28 MED ORDER — CARVEDILOL 25 MG PO TABS: 25.0000 mg | ORAL_TABLET | Freq: Two times a day (BID) | ORAL | Status: DC

## 2018-01-28 MED ORDER — INSULIN ASPART 100 UNIT/ML ~~LOC~~ SOLN: 0.0000 [IU] | Freq: Three times a day (TID) | SUBCUTANEOUS | Status: DC

## 2018-01-28 MED ORDER — LINAGLIPTIN 5 MG PO TABS: 5.0000 mg | ORAL_TABLET | Freq: Every day | ORAL | Status: DC

## 2018-01-28 MED ORDER — SODIUM CHLORIDE 0.9 % WEIGHT BASED INFUSION: 1.0000 mL/kg/h | INTRAVENOUS | Status: DC

## 2018-01-28 MED ORDER — SODIUM BICARBONATE BOLUS VIA INFUSION: INTRAVENOUS | Status: DC

## 2018-01-28 MED ORDER — HYDRALAZINE HCL 25 MG PO TABS: 25.0000 mg | ORAL_TABLET | Freq: Two times a day (BID) | ORAL | Status: DC

## 2018-01-28 MED ORDER — ALPRAZOLAM 0.25 MG PO TABS: 0.2500 mg | ORAL_TABLET | Freq: Two times a day (BID) | ORAL | Status: DC | PRN

## 2018-01-28 MED ORDER — ONDANSETRON HCL 4 MG/2ML IJ SOLN: 4.0000 mg | Freq: Four times a day (QID) | INTRAMUSCULAR | Status: DC | PRN

## 2018-01-28 MED ORDER — CLOPIDOGREL BISULFATE 75 MG PO TABS: 75.0000 mg | ORAL_TABLET | Freq: Every day | ORAL | Status: DC

## 2018-01-28 MED ORDER — INSULIN ASPART 100 UNIT/ML ~~LOC~~ SOLN: 0.0000 [IU] | Freq: Every day | SUBCUTANEOUS | Status: DC

## 2018-01-28 MED ORDER — SODIUM CHLORIDE 0.9% FLUSH: 3.0000 mL | INTRAVENOUS | Status: DC | PRN

## 2018-01-28 MED ORDER — ISOSORBIDE MONONITRATE ER 60 MG PO TB24: 60.0000 mg | ORAL_TABLET | Freq: Every day | ORAL | Status: DC

## 2018-01-28 MED ORDER — SODIUM CHLORIDE 0.9 % IV SOLN
INTRAVENOUS | Status: DC
  Administered 2018-01-28 (×2): via INTRAVENOUS

## 2018-01-28 MED ORDER — AMLODIPINE BESYLATE 10 MG PO TABS: 10.0000 mg | ORAL_TABLET | Freq: Every day | ORAL | Status: DC

## 2018-01-28 MED ORDER — ATORVASTATIN CALCIUM 80 MG PO TABS: 80.0000 mg | ORAL_TABLET | Freq: Every day | ORAL | Status: DC

## 2018-01-28 NOTE — Care Management Obs Status (Signed)
[place] NOTIFICATION   Patient Details  Name: James Mcclain MRN: [id number] Date of Birth: 1945/04/21   Medicare Observation Status Notification Given:  Yes    Zenon Mayo, RN 01/28/2018, 4:28 PM

## 2018-01-28 NOTE — Telephone Encounter (Signed)
Ok with referral to [facility] to help pay for medications

## 2018-01-28 NOTE — ED Notes (Signed)
BREAKFAST ORDERED  

## 2018-01-28 NOTE — Progress Notes (Addendum)
Left lower extremity duplex completed.   Preliminary results in CV Proc. Results given to RN.   Abram Sander 01/28/2018 8:26 AM

## 2018-01-28 NOTE — ED Notes (Signed)
Pt transported to [facility], pt received breakfast tray.

## 2018-01-28 NOTE — ED Notes (Signed)
Pt retunrs from [facility].

## 2018-01-28 NOTE — Addendum Note (Signed)
Addended by: Sheral Flow on: 01/28/2018 02:03 PM   Modules accepted: Orders

## 2018-01-28 NOTE — Care Management CC44 (Signed)
Condition Code 44 Documentation Completed  Patient Details  Name: IZEN PETZ MRN: [id number] Date of Birth: [birth year]   Condition Code 44 given:  Yes Patient signature on Condition Code 44 notice:  Yes Documentation of 2 MD's agreement:  Yes Code 44 added to claim:  Yes    Zenon Mayo, RN 01/28/2018, 4:28 PM

## 2018-01-28 NOTE — Telephone Encounter (Signed)
Referral placed.

## 2018-01-28 NOTE — Progress Notes (Signed)
PROGRESS NOTE                                                                                                                                                                                                             Patient Demographics:    James Mcclain, is a 72 y.o. male, DOB - 1945-04-14, ZTI:[id number]  Admit date - 01/27/2018   Admitting Physician James Bulls, MD  Outpatient Primary MD for the patient is James Paddy, FNP  LOS - 1   No chief complaint on file.      Brief Narrative    72 y.o. male with medical history significant for hypertension, insulin-dependent diabetes mellitus, chronic kidney disease stage III, coronary artery disease with stents, polycythemia managed with phlebotomy, presents to ED with complaints of shortness of breath, it is progressive over the last month, initially dyspnea on exertion, with acute exacerbation of dyspnea, requiring BiPAP initially in ED, x-ray questionable for up pneumonia, started on IV Rocephin and azithromycin, so his cardiologist Dr. Stanford Breed, and day of admission, plan for outpatient cardiac cath .   Subjective:    Zella Ball today has, No headache, No chest pain, No abdominal pain -reports no acute dyspnea, reports cough, with a productive white phlegm   a Principal Problem:   Acute respiratory distress Active Problems:   CAD S/P percutaneous coronary angioplasty   History of CVA (cerebrovascular accident)   Polycythemia   Insulin dependent diabetes mellitus (HCC)   AF (paroxysmal atrial fibrillation) (HCC)   CKD (chronic kidney disease) stage 3, GFR 30-59 ml/min (HCC)   CAP (community acquired pneumonia)   Hypertensive urgency   Unstable angina (HCC)  Acute respiratory distress -Patient reports 1 month of dyspnea on exertion, he was admitted yesterday with acute worsening of dyspnea, been followed with cardiology as an outpatient, plan for cardiac cath in the next few days as this  felt his dyspnea could be anginal equivalent. -We will repeat CT chest without contrast to evaluate if there is possibility of pneumonia. -Chest x-ray with right lung opacity, he has some leukocytosis, cough with a productive sputum, will obtain CT chest confirmed pneumonia diagnosis  is appropriate to continue with antibiotics.  Now continue with azithromycin and Rocephin. -Blood culture remains negative, follow on sputum culture, influenza panel is negative  CAD  - Has known CAD s/p  PCI and planned for diagnostic cath in a few days for DOE  -Currently denies any chest pain, troponin non-ACS pattern -  continue statin, beta-blocker, Imdur, Plavix    Paroxysmal atrial fibrillation  - In sinus rhythm on admission  - CHADS-VASc 6 (age, CVA x2, HTN, DM, CAD)  - Continue Eliquis and beta-blocker   CKD stage III  - SCr is 1.81 on admission, similar to priors  - Renally-dose medications    Polycythemia  - Counts appears stable  - Follows with oncology and managed with phlebotomy q65mos, last was ~1 month ago   Hypertension with hypertensive urgency  - BP 210/100 initially in ED, likely secondary to acute respiratory distress, improved on BiPAP  - Continue Norvasc, Coreg, hydralazine    History of CVA  - Continue statin and antiplatelets   Insulin-dependent DM , controlled - A1c was 7.3% in July  - Managed at home with Wilder Glade and Lantus 45 units qD  - Check CBG's, hold Farxiga, continue Lantus with Novolog correctional while in hospital      Code Status : Full  Family Communication  : none at bedside  Disposition Plan  : home  Consults  :  none  Procedures  : none  DVT Prophylaxis  :  Eliquis  Lab Results  Component Value Date   PLT 323 01/28/2018    Antibiotics  :    Anti-infectives (From admission, onward)   Start     Dose/Rate Route Frequency Ordered Stop   01/27/18 1945  cefTRIAXone (ROCEPHIN) 2 g in sodium chloride 0.9 % 100 mL IVPB     2 g 200 mL/hr  over 30 Minutes Intravenous Every 24 hours 01/27/18 1940     01/27/18 1945  azithromycin (ZITHROMAX) 500 mg in sodium chloride 0.9 % 250 mL IVPB     500 mg 250 mL/hr over 60 Minutes Intravenous Every 24 hours 01/27/18 1940          Objective:   Vitals:   01/28/18 1000 01/28/18 1027 01/28/18 1100 01/28/18 1200  BP:  139/80 139/78 140/73  Pulse: 72  67 77  Resp: 14  14 18   Temp:      TempSrc:      SpO2: 98%  99% 99%    Wt Readings from Last 3 Encounters:  01/27/18 114 kg  01/08/18 112 kg  12/17/17 111.8 kg    No intake or output data in the 24 hours ending 01/28/18 1525   Physical Exam  Awake Alert, Oriented X 3, No new F.N deficits, Normal affect Symmetrical Chest wall movement, Good air movement bilaterally, CTAB RRR,No Gallops,Rubs or new Murmurs, No Parasternal Heave +ve B.Sounds, Abd Soft, No tenderness, No rebound - guarding or rigidity. No Cyanosis, Clubbing or edema, No new Rash or bruise      Data Review:    CBC Recent Labs  Lab 01/27/18 1050 01/27/18 1845 01/28/18 0512  WBC 15.8* 15.9* 17.4*  HGB 15.1 16.3 13.5  HCT 53.7* 61.3* 51.3  PLT 389 407* 323  MCV 72* 75.1* 75.3*  MCH 20.4* 20.0* 19.8*  MCHC 28.1* 26.6* 26.3*  RDW 22.5* 24.3* 23.6*  LYMPHSABS 1.7 1.0 1.4  MONOABS  --  1.0 0.7  EOSABS 0.3 0.3 0.5  BASOSABS 0.1 0.0 0.0    Chemistries  Recent Labs  Lab 01/27/18 1050 01/27/18 1845 01/28/18 0512  NA 142 142 140  K 4.6 4.1 4.4  CL 105 108 106  CO2 21 25 22   GLUCOSE 61* 117* 131*  BUN 28* 26* 29*  CREATININE 1.80* 1.81* 1.83*  CALCIUM 8.7 8.7* 7.8*   ------------------------------------------------------------------------------------------------------------------ No results for input(s): CHOL, HDL, LDLCALC, TRIG, CHOLHDL, LDLDIRECT in the last 72 hours.  Lab Results  Component Value Date   HGBA1C 7.3 (H) 09/21/2017    ------------------------------------------------------------------------------------------------------------------ No results for input(s): TSH, T4TOTAL, T3FREE, THYROIDAB in the last 72 hours.  Invalid input(s): FREET3 ------------------------------------------------------------------------------------------------------------------ No results for input(s): VITAMINB12, FOLATE, FERRITIN, TIBC, IRON, RETICCTPCT in the last 72 hours.  Coagulation profile Recent Labs  Lab 01/27/18 1845  INR 1.19    No results for input(s): DDIMER in the last 72 hours.  Cardiac Enzymes Recent Labs  Lab 01/27/18 2358 01/28/18 0512 01/28/18 1323  TROPONINI <0.03 0.04* <0.03   ------------------------------------------------------------------------------------------------------------------    Component Value Date/Time   BNP 322.6 (H) 01/27/2018 1845    Inpatient Medications  Scheduled Meds: . allopurinol  200 mg Oral Daily  . amLODipine  10 mg Oral Daily  . [START ON 01/29/2018] apixaban  5 mg Oral BID  . [START ON 01/29/2018] aspirin EC  81 mg Oral Daily  . atorvastatin  80 mg Oral q1800  . carvedilol  25 mg Oral BID WC  . clopidogrel  75 mg Oral Q breakfast  . [START ON 02/01/2018] diphenhydrAMINE  50 mg Oral QHS  . hydrALAZINE  25 mg Oral BID  . insulin aspart  0-5 Units Subcutaneous QHS  . insulin aspart  0-9 Units Subcutaneous TID WC  . insulin glargine  25 Units Subcutaneous Daily  . isosorbide mononitrate  60 mg Oral Daily  . [START ON 02/03/2018] linagliptin  5 mg Oral Daily  . [START ON 02/01/2018] predniSONE  50 mg Oral QHS  . sodium chloride flush  3 mL Intravenous Q12H  . sodium chloride flush  3 mL Intravenous Q12H   Continuous Infusions: . sodium chloride    . sodium chloride Stopped (01/28/18 1515)  . sodium chloride    . azithromycin Stopped (01/27/18 2243)  . cefTRIAXone (ROCEPHIN)  IV Stopped (01/27/18 2126)   PRN Meds:.sodium chloride, acetaminophen **OR**  acetaminophen, albuterol, ALPRAZolam, HYDROcodone-acetaminophen, nitroGLYCERIN, ondansetron **OR** ondansetron (ZOFRAN) IV, senna-docusate, sodium chloride flush, sodium chloride flush, zolpidem  Micro Results Recent Results (from the past 240 hour(s))  Blood Culture (routine x 2)     Status: None (Preliminary result)   Collection Time: 01/27/18  8:06 PM  Result Value Ref Range Status   Specimen Description BLOOD RIGHT ANTECUBITAL  Final   Special Requests   Final    BOTTLES DRAWN AEROBIC AND ANAEROBIC Blood Culture results may not be optimal due to an inadequate volume of blood received in culture bottles   Culture   Final    NO GROWTH < 24 HOURS Performed at [facility], [place] [address], [place], [place] [address]    Report Status PENDING  Incomplete  Blood Culture (routine x 2)     Status: None (Preliminary result)   Collection Time: 01/27/18  8:09 PM  Result Value Ref Range Status   Specimen Description BLOOD RIGHT FOREARM  Final   Special Requests   Final    BOTTLES DRAWN AEROBIC AND ANAEROBIC Blood Culture results may not be optimal due to an inadequate volume of blood received in culture bottles   Culture   Final    NO GROWTH < 24 HOURS Performed at [facility], [place] [address], [place], [place] [address]    Report Status PENDING  Incomplete    Radiology Reports Dg  Chest 2 View  Result Date: 01/04/2018 CLINICAL DATA:  Chest pain for 2 days, initial encounter EXAM: CHEST - 2 VIEW COMPARISON:  10/23/2017 FINDINGS: Cardiac shadow is mildly enlarged but stable. Loop recorder is again seen. The lungs are well aerated bilaterally. No focal infiltrate or sizable effusion is seen. IMPRESSION: No acute abnormality noted. Electronically Signed   By: Inez Catalina M.D.   On: 01/04/2018 10:18   Dg Chest Port 1 View  Result Date: 01/27/2018 CLINICAL DATA:  Shortness of breath. EXAM: PORTABLE CHEST 1 VIEW COMPARISON:  January 04, 2018 FINDINGS: Mild increased hazy opacity in  left mid lower lung, asymmetric to the right. No pneumothorax. No nodule or mass. Mild cardiomegaly is stable. No other acute abnormalities. IMPRESSION: Mild hazy opacity in the left mid lower lung may represent asymmetric edema versus subtle infiltrate. Recommend clinical correlation and follow-up to resolution. Electronically Signed   By: Dorise Bullion III M.D   On: 01/27/2018 19:19   Vas [place] Lower Extremity Venous (dvt) (only [place] [place])  Result Date: 01/28/2018  Lower Venous Study Indications: Edema.  Performing Technologist: Abram Sander RVS  Examination Guidelines: A complete evaluation includes B-mode imaging, spectral Doppler, color Doppler, and power Doppler as needed of all accessible portions of each vessel. Bilateral testing is considered an integral part of a complete examination. Limited examinations for reoccurring indications may be performed as noted.  Right Venous Findings: +---+---------------+---------+-----------+----------+-------+    CompressibilityPhasicitySpontaneityPropertiesSummary +---+---------------+---------+-----------+----------+-------+ CFVFull           Yes      Yes                          +---+---------------+---------+-----------+----------+-------+  Left Venous Findings: +---------+---------------+---------+-----------+----------+-------+          CompressibilityPhasicitySpontaneityPropertiesSummary +---------+---------------+---------+-----------+----------+-------+ CFV      Full           Yes      Yes                          +---------+---------------+---------+-----------+----------+-------+ SFJ      Full                                                 +---------+---------------+---------+-----------+----------+-------+ FV Prox  Full                                                 +---------+---------------+---------+-----------+----------+-------+ FV Mid   Full                                                  +---------+---------------+---------+-----------+----------+-------+ FV DistalFull                                                 +---------+---------------+---------+-----------+----------+-------+ PFV      Full                                                 +---------+---------------+---------+-----------+----------+-------+  POP      Full           Yes      Yes                          +---------+---------------+---------+-----------+----------+-------+ PTV      Full                                                 +---------+---------------+---------+-----------+----------+-------+ PERO     Full                                                 +---------+---------------+---------+-----------+----------+-------+    Summary: Right: No evidence of common femoral vein obstruction. Left: There is no evidence of deep vein thrombosis in the lower extremity. No cystic structure found in the popliteal fossa.  *See table(s) above for measurements and observations.    Preliminary     Phillips Climes M.D on 01/28/2018 at 3:25 PM  Between 7am to 7pm - Pager - [phone]  After 7pm go to [email or web address] - password [email or web address]  [facility] -  Office  [phone]

## 2018-01-29 ENCOUNTER — Telehealth: Payer: Self-pay | Admitting: Cardiology

## 2018-01-29 ENCOUNTER — Ambulatory Visit (HOSPITAL_COMMUNITY): Payer: Medicare HMO

## 2018-01-29 DIAGNOSIS — I480 Paroxysmal atrial fibrillation: Secondary | ICD-10-CM | POA: Diagnosis not present

## 2018-01-29 DIAGNOSIS — I160 Hypertensive urgency: Secondary | ICD-10-CM | POA: Diagnosis not present

## 2018-01-29 DIAGNOSIS — R0603 Acute respiratory distress: Secondary | ICD-10-CM | POA: Diagnosis not present

## 2018-01-29 DIAGNOSIS — Z8673 Personal history of transient ischemic attack (TIA), and cerebral infarction without residual deficits: Secondary | ICD-10-CM | POA: Diagnosis not present

## 2018-01-29 DIAGNOSIS — E1122 Type 2 diabetes mellitus with diabetic chronic kidney disease: Secondary | ICD-10-CM | POA: Diagnosis not present

## 2018-01-29 DIAGNOSIS — N183 Chronic kidney disease, stage 3 (moderate): Secondary | ICD-10-CM | POA: Diagnosis not present

## 2018-01-29 DIAGNOSIS — I25110 Atherosclerotic heart disease of native coronary artery with unstable angina pectoris: Secondary | ICD-10-CM | POA: Diagnosis not present

## 2018-01-29 DIAGNOSIS — I130 Hypertensive heart and chronic kidney disease with heart failure and stage 1 through stage 4 chronic kidney disease, or unspecified chronic kidney disease: Secondary | ICD-10-CM | POA: Diagnosis not present

## 2018-01-29 DIAGNOSIS — Z794 Long term (current) use of insulin: Secondary | ICD-10-CM | POA: Diagnosis not present

## 2018-01-29 LAB — TROPONIN I: Troponin I: <0.03 ng/mL (ref <0.03)

## 2018-01-29 LAB — CBC WITH DIFFERENTIAL/PLATELET
Abs Immature Granulocytes: 0.11 10*3/uL — ABNORMAL HIGH (ref 0.00–0.07)
Basophils Absolute: 0 10*3/uL (ref 0.0–0.1)
Basophils Relative: 0 %
Eosinophils Absolute: 0.3 10*3/uL (ref 0.0–0.5)
Eosinophils Relative: 3 %
HCT: 51.5 % (ref 39.0–52.0)
Hemoglobin: 13.9 g/dL (ref 13.0–17.0)
Immature Granulocytes: 1 %
Lymphocytes Relative: 8 %
Lymphs Abs: 1.1 10*3/uL (ref 0.7–4.0)
MCH: 20 pg — ABNORMAL LOW (ref 26.0–34.0)
MCHC: 27 g/dL — ABNORMAL LOW (ref 30.0–36.0)
MCV: 74 fL — ABNORMAL LOW (ref 80.0–100.0)
Monocytes Absolute: 1 10*3/uL (ref 0.1–1.0)
Monocytes Relative: 8 %
Neutro Abs: 10.4 10*3/uL — ABNORMAL HIGH (ref 1.7–7.7)
Neutrophils Relative %: 80 %
Platelets: 329 10*3/uL (ref 150–400)
RBC: 6.96 MIL/uL — ABNORMAL HIGH (ref 4.22–5.81)
RDW: 23.8 % — ABNORMAL HIGH (ref 11.5–15.5)
WBC: 13 10*3/uL — ABNORMAL HIGH (ref 4.0–10.5)
nRBC: 0.3 % — ABNORMAL HIGH (ref 0.0–0.2)

## 2018-01-29 LAB — BASIC METABOLIC PANEL
Anion gap: 8 (ref 5–15)
BUN: 25 mg/dL — ABNORMAL HIGH (ref 8–23)
CO2: 24 mmol/L (ref 22–32)
Calcium: 7.9 mg/dL — ABNORMAL LOW (ref 8.9–10.3)
Chloride: 109 mmol/L (ref 98–111)
Creatinine, Ser: 1.83 mg/dL — ABNORMAL HIGH (ref 0.61–1.24)
GFR calc Af Amer: 42 mL/min — ABNORMAL LOW (ref >60)
GFR calc non Af Amer: 36 mL/min — ABNORMAL LOW (ref >60)
Glucose, Bld: 145 mg/dL — ABNORMAL HIGH (ref 70–99)
Potassium: 4 mmol/L (ref 3.5–5.1)
Sodium: 141 mmol/L (ref 135–145)

## 2018-01-29 LAB — GLUCOSE, CAPILLARY: Glucose-Capillary: 108 mg/dL — ABNORMAL HIGH (ref 70–99)

## 2018-01-29 MED ORDER — MORPHINE SULFATE (PF) 2 MG/ML IV SOLN
2.0000 mg | Freq: Once | INTRAVENOUS | Status: AC
  Administered 2018-01-29: 2 mg via INTRAVENOUS
  Filled 2018-01-29: qty 1

## 2018-01-29 NOTE — Telephone Encounter (Signed)
New message   Per patient's sister, patient is in hospital. His sister wants you to know that he is in the hospital.

## 2018-01-29 NOTE — Progress Notes (Signed)
Pt c/o sudden 8/10 CP, sharp and non radiating. Stating that it is the same kind of pain as earlier in the night but is more severe. Pt O2 saturation 98% on RA. Two doses of nitroglycerin were given with moderate relief and an EKG was obtained and placed on the chart. On call NP [place] notified.   A STAT troponin, morphine 2MG /ML IV, and a CBC w/ Diff have been ordered.   Will continue to monitor pt closely.

## 2018-01-29 NOTE — Telephone Encounter (Signed)
Left message for patient sister, aware dr Stanford Breed is out of town.

## 2018-01-29 NOTE — Discharge Instructions (Signed)
Follow with Primary MD [name], FNP in 7 days   Get CBC, CMP,checked  by Primary MD next visit.   1) HOLD Lisinopril starting today until further instructed 2) INCREASE Imdur to 60mg  daily 3) Take your last dose of Eliquis on Sunday evening 01/31/18 in anticipation of your procedure on 02/02/18  Activity: As tolerated with Full fall precautions use walker/cane & assistance as needed   Disposition Home    Diet: Heart Healthy , with feeding assistance and aspiration precautions.  For Heart failure patients - Check your Weight same time everyday, if you gain over 2 pounds, or you develop in leg swelling, experience more shortness of breath or chest pain, call your Primary MD immediately. Follow Cardiac Low Salt Diet and 1.5 lit/day fluid restriction.   On your next visit with your primary care physician please Get Medicines reviewed and adjusted.   Please request your Prim.MD to go over all Hospital Tests and Procedure/Radiological results at the follow up, please get all Hospital records sent to your Prim MD by signing hospital release before you go home.   If you experience worsening of your admission symptoms, develop shortness of breath, life threatening emergency, suicidal or homicidal thoughts you must seek medical attention immediately by calling 911 or calling your MD immediately  if symptoms less severe.  You Must read complete instructions/literature along with all the possible adverse reactions/side effects for all the Medicines you take and that have been prescribed to you. Take any new Medicines after you have completely understood and accpet all the possible adverse reactions/side effects.   Do not drive, operating heavy machinery, perform activities at heights, swimming or participation in water activities or provide baby sitting services if your were admitted for syncope or siezures until you have seen by Primary MD or a Neurologist and advised to do so again.  Do  not drive when taking Pain medications.    Do not take more than prescribed Pain, Sleep and Anxiety Medications  Special Instructions: If you have smoked or chewed Tobacco  in the last 2 yrs please stop smoking, stop any regular Alcohol  and or any Recreational drug use.  Wear Seat belts while driving.   Please note  You were cared for by a hospitalist during your hospital stay. If you have any questions about your discharge medications or the care you received while you were in the hospital after you are discharged, you can call the unit and asked to speak with the hospitalist on call if the hospitalist that took care of you is not available. Once you are discharged, your primary care physician will handle any further medical issues. Please note that NO REFILLS for any discharge medications will be authorized once you are discharged, as it is imperative that you return to your primary care physician (or establish a relationship with a primary care physician if you do not have one) for your aftercare needs so that they can reassess your need for medications and monitor your lab values.  ========================================================================  Information on my medicine - ELIQUIS (apixaban)  Why was Eliquis prescribed for you? Eliquis was prescribed for you to reduce the risk of a blood clot forming that can cause a stroke if you have a medical condition called atrial fibrillation (a type of irregular heartbeat).  What do You need to know about Eliquis ? Take your Eliquis TWICE DAILY - one tablet in the morning and one tablet in the evening with or without food. If you  have difficulty swallowing the tablet whole please discuss with your pharmacist how to take the medication safely.  Take Eliquis exactly as prescribed by your doctor and DO NOT stop taking Eliquis without talking to the doctor who prescribed the medication.  Stopping may increase your risk of developing a  stroke.  Refill your prescription before you run out.  After discharge, you should have regular check-up appointments with your healthcare provider that is prescribing your Eliquis.  In the future your dose may need to be changed if your kidney function or weight changes by a significant amount or as you get older.  What do you do if you miss a dose? If you miss a dose, take it as soon as you remember on the same day and resume taking twice daily.  Do not take more than one dose of ELIQUIS at the same time to make up a missed dose.  Important Safety Information A possible side effect of Eliquis is bleeding. You should call your healthcare provider right away if you experience any of the following: ? Bleeding from an injury or your nose that does not stop. ? Unusual colored urine (red or dark brown) or unusual colored stools (red or black). ? Unusual bruising for unknown reasons. ? A serious fall or if you hit your head (even if there is no bleeding).  Some medicines may interact with Eliquis and might increase your risk of bleeding or clotting while on Eliquis. To help avoid this, consult your healthcare provider or pharmacist prior to using any new prescription or non-prescription medications, including herbals, vitamins, non-steroidal anti-inflammatory drugs (NSAIDs) and supplements.  This website has more information on Eliquis (apixaban): http://www.eliquis.com/eliquis/home

## 2018-01-29 NOTE — Discharge Summary (Addendum)
James Mcclain, is a 72 y.o. male  DOB [birth year]  MRN [id number].  Admission date:  01/27/2018  Admitting Physician  Vianne Bulls, MD  Discharge Date:  01/29/2018   Primary MD  [name], FNP  Recommendations for primary care physician for things to follow:  - please check CBC, BMP during next visit   Admission Diagnosis  Acute respiratory distress [R06.03]   Discharge Diagnosis  Acute respiratory distress [R06.03]    Principal Problem:   Acute respiratory distress Active Problems:   CAD S/P percutaneous coronary angioplasty   History of CVA (cerebrovascular accident)   Polycythemia   Insulin dependent diabetes mellitus (HCC)   AF (paroxysmal atrial fibrillation) (HCC)   CKD (chronic kidney disease) stage 3, GFR 30-59 ml/min (HCC)   Hypertensive urgency   Unstable angina (HCC)      Past Medical History:  Diagnosis Date  . Arthritis    "right hand" (09/21/2017)  . CHF (congestive heart failure) ([place])   . CKD (chronic kidney disease) stage 3, GFR 30-59 ml/min (HCC)   . Coronary artery disease    a. s/p Xience DES to the LAD 07/2012. b. 09/21/17 Synergy stent to RI.   . Dilated aortic root ([place])    a. Mildly dilated by echo 09/2013, f/u MRA scheduled for 12/2014.  [name] Elevated PSA   . Essential hypertension   . Gout    "on daily RX" (09/21/2017)  . Heart murmur   . History of kidney stones   . Hyperlipidemia   . Insulin dependent diabetes mellitus ([place])   . 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 On tele 09/2013.  [name] Paroxysmal atrial fibrillation (HCC)   . PFO (patent foramen ovale)    a. By echo 09/2013.  [name] Polycythemia   . Prostate cancer ([place]) 07/15/12   Gleason 7, external beam radiation Tx, Dr. Pilar Jarvis  . PVC's (premature ventricular contractions)   . Stroke [facility])    a. Cryptogenic, 09/2013, event monitor with NSR. Small PFO noted on echo but neg LE duplex.    Past Surgical  History:  Procedure Laterality Date  . COLONOSCOPY WITH PROPOFOL N/A 02/22/2016   Procedure: COLONOSCOPY WITH PROPOFOL;  Surgeon: Carol Ada, MD;  Location: [facility];  Service: Endoscopy;  Laterality: N/A;  . CORONARY ANGIOPLASTY WITH STENT PLACEMENT  08/05/2012   LAD, 1 stent  . CORONARY STENT INTERVENTION N/A 09/21/2017   Procedure: CORONARY STENT INTERVENTION;  Surgeon: Belva Crome, MD;  Location: [place] CV LAB;  Service: Cardiovascular;  Laterality: N/A;  . EP IMPLANTABLE DEVICE N/A 08/23/2014   Procedure: Loop Recorder Insertion;  Surgeon: Evans Lance, MD;  Location: [place] CV LAB;  Service: Cardiovascular;  Laterality: N/A;  . INSERTION PROSTATE RADIATION SEED    . JOINT REPLACEMENT    . LEFT HEART CATH AND CORONARY ANGIOGRAPHY N/A 09/21/2017   Procedure: LEFT HEART CATH AND CORONARY ANGIOGRAPHY;  Surgeon: Belva Crome, MD;  Location: [place] CV LAB;  Service: Cardiovascular;  Laterality: N/A;  .  LITHOTRIPSY  2006  . PERCUTANEOUS CORONARY STENT INTERVENTION (PCI-S) N/A 08/05/2012   Procedure: PERCUTANEOUS CORONARY STENT INTERVENTION (PCI-S);  Surgeon: Sherren Mocha, MD;  Location[facility] CATH LAB;  Service: Cardiovascular;  Laterality: N/A;  . PROSTATE BIOPSY  07/15/2012; 09/23/2013  . TEE WITHOUT CARDIOVERSION N/A 09/27/2013   Procedure: TRANSESOPHAGEAL ECHOCARDIOGRAM (TEE);  Surgeon: Larey Dresser, MD;  Location: [place];  Service: Cardiovascular;  Laterality: N/A;  . TONSILLECTOMY  1990s  . TOTAL KNEE ARTHROPLASTY  01/24/2011   Procedure: TOTAL KNEE ARTHROPLASTY;  Surgeon: Alta Corning;  Location: [place];  Service: Orthopedics;  Laterality: Left;  COMPUTER ASSISTED LEFT  TOTAL KNEE REPLACEMENT. Anesthesia a combination of regional and general.       History of present illness and  Hospital Course:     Kindly see H&P for history of present illness and admission details, please review complete Labs, Consult reports and Test reports for all details in  brief  HPI  from the history and physical done on the day of admission 01/27/2018 HPI: James Mcclain is a 72 y.o. male with medical history significant for hypertension, insulin-dependent diabetes mellitus, chronic kidney disease stage III, coronary artery disease with stents, polycythemia managed with phlebotomy, now presenting to the emergency department with acute respiratory distress.  Patient reports approximately 1 month of exertional dyspnea, worsened acutely today.  He also developed a cough today, occasionally productive of clear and white sputum.  He was seen by his cardiologist in the clinic, there was concern that his recent dyspnea could be an anginal equivalent, and he is scheduled for upcoming diagnostic catheterization.  He has not noted any fevers or chills, denies any chest pain, and denies lower extremity swelling or tenderness.  He reports strict adherence with his Eliquis with no missed doses.  Has had some mild rhinorrhea and sore throat recently, but denies sick contacts.  ED Course: Upon arrival to the ED, patient is found to be afebrile, saturating adequately on BiPAP, tachypneic, slightly tachycardic, and with blood pressure 210/100.  EKG features sinus tachycardia with rate 103, LAD, RBBB, and nonspecific ST-T abnormalities that appears similar to prior.  Chest x-ray is notable for mild hazy opacity in the left lower lung, possibly reflecting asymmetric edema or pneumonia.  Chemistry panel is notable for creatinine 1.81, similar to priors.  CBC features a leukocytosis to 15,900 and stable chronic polycythemia.  Lactic acid is normal, troponin slightly elevated, and BNP slightly elevated.  Blood cultures were collected and the patient was treated with Rocephin, azithromycin, and BiPAP in the ED.  He has improved while in the emergency department, is being transitioned off of BiPAP, and will be admitted for ongoing evaluation and management.   Hospital Course   72 y.o.malewith  medical history significant forhypertension, insulin-dependent diabetes mellitus, chronic kidney disease stage III, coronary artery disease with stents, polycythemia managed with phlebotomy, presents to ED with complaints of shortness of breath, it is progressive over the last month, initially dyspnea on exertion, with acute exacerbation of dyspnea, requiring BiPAP initially in ED, x-ray questionable for up pneumonia, started on IV Rocephin and azithromycin, so his cardiologist, on day of admission, plan for outpatient cardiac cath .  CT chest with no evidence of pneumonia   Acute respiratory distress -Patient reports 1 month of dyspnea on exertion, he was admitted 01/27/2018 with acute worsening of dyspnea, been followed with cardiology as an outpatient, and seen by cardiology at day of admission, and for cardiac cath next week on  December 10 as his dyspnea was felt to be angina equivalent. -Questionable pneumonia on admission, some leukocytosis, but afebrile, CT chest was obtained, there is no evidence of pneumonia, antibiotic has been stopped, pneumonia has been ruled out this admission. -Have discussed with cardiology and his dyspnea on presentation, they plan to add right heart cath to left heart cath.  CAD  - Has known CAD s/p PCI and planned for diagnostic cath in a few days for DOE -troponin non-ACS pattern - continue statin, beta-blocker, Imdur, Plavix -Patient with complaints of chest pain overnight, nontypical, was reproducible by palpation, troponins were negative, EKG nonacute.  Paroxysmal atrial fibrillation -In sinus rhythm on admission -CHADS-VASc 6 (age, CVA x2, HTN, DM, CAD) -Continue Eliquis and beta-blocker, patient was instructed to take last dose of Eliquis 01/31/2018 evening as he is planned for cardiac cath on Tuesday  CKD stage III -SCr is 1.81 on admission, similar to priors -Renally-dose medications  Polycythemia -Counts appears  stable -Follows with oncology and managed with phlebotomy q28mo, last was ~1 month ago  Hypertension with hypertensive urgency -BP 210/100 initially in ED, likely secondary to acute respiratory distress, improved on BiPAP -Continue Norvasc, Coreg, hydralazine  History of hemorrhagic CVA -Continue statin and antiplatelets  Insulin-dependent DM, controlled -A1c was 7.3% in July -Resume home meds on discharge    Discharge Condition:  Stable   Follow UP  Follow-up Information    KElenore Paddy FNP .   Specialty:  Family Medicine Contact information: [phone]       CLelon Perla MD .   Specialty:  Cardiology Contact information: [phone]            Discharge Instructions  and  Discharge Medications    Discharge Instructions    Discharge instructions   Complete by:  As directed    Follow with Primary MD KElenore Paddy FNP in 7 days   Get CBC, CMP,checked  by Primary MD next visit.   1) HOLD Lisinopril starting today until further instructed 2) INCREASE Imdur to '60mg'$  daily 3) Take your last dose of Eliquis on Sunday evening 01/31/18 in anticipation of your procedure on 02/02/18  Activity: As tolerated with Full fall precautions use walker/cane & assistance as needed   Disposition Home    Diet: Heart Healthy ,carb modified with feeding assistance and aspiration precautions.  For Heart failure patients - Check your Weight same time everyday, if you gain over 2 pounds, or you develop in leg swelling, experience more shortness of breath or chest pain, call your Primary MD immediately. Follow Cardiac Low Salt Diet and 1.5 lit/day fluid restriction.   On your next visit with your primary care physician please Get Medicines reviewed and adjusted.   Please request your Prim.MD to go over all Hospital Tests and  Procedure/Radiological results at the follow up, please get all Hospital records sent to your Prim MD by signing hospital release before you go home.   If you experience worsening of your admission symptoms, develop shortness of breath, life threatening emergency, suicidal or homicidal thoughts you must seek medical attention immediately by calling 911 or calling your MD immediately  if symptoms less severe.  You Must read complete instructions/literature along with all the possible adverse reactions/side effects for all the Medicines you take and that have been prescribed to you. Take any new Medicines after you have completely understood and accpet all the possible adverse  reactions/side effects.   Do not drive, operating heavy machinery, perform activities at heights, swimming or participation in water activities or provide baby sitting services if your were admitted for syncope or siezures until you have seen by Primary MD or a Neurologist and advised to do so again.  Do not drive when taking Pain medications.    Do not take more than prescribed Pain, Sleep and Anxiety Medications  Special Instructions: If you have smoked or chewed Tobacco  in the last 2 yrs please stop smoking, stop any regular Alcohol  and or any Recreational drug use.  Wear Seat belts while driving.   Please note  You were cared for by a hospitalist during your hospital stay. If you have any questions about your discharge medications or the care you received while you were in the hospital after you are discharged, you can call the unit and asked to speak with the hospitalist on call if the hospitalist that took care of you is not available. Once you are discharged, your primary care physician will handle any further medical issues. Please note that NO REFILLS for any discharge medications will be authorized once you are discharged, as it is imperative that you return to your primary care physician (or establish a  relationship with a primary care physician if you do not have one) for your aftercare needs so that they can reassess your need for medications and monitor your lab values.   Increase activity slowly   Complete by:  As directed      Allergies as of 01/29/2018      Reactions   Betadine [povidone Iodine] Other (See Comments)   Patient doesn't remember   Contrast Media [iodinated Diagnostic Agents] Other (See Comments)   Patient doesn't remember   Fish Allergy Swelling      Medication List    STOP taking these medications   lisinopril 40 MG tablet Commonly known as:  PRINIVIL,ZESTRIL     TAKE these medications   albuterol 108 (90 Base) MCG/ACT inhaler Commonly known as:  PROVENTIL HFA;VENTOLIN HFA Inhale 2 puffs into the lungs every 6 (six) hours as needed for wheezing or shortness of breath.   allopurinol 100 MG tablet Commonly known as:  ZYLOPRIM TAKE TWO TABLETS BY MOUTH ONCE DAILY What changed:  how much to take   amLODipine 10 MG tablet Commonly known as:  NORVASC Take 1 tablet (10 mg total) by mouth daily.   apixaban 5 MG Tabs tablet Commonly known as:  ELIQUIS Take 1 tablet (5 mg total) by mouth 2 (two) times daily.   atorvastatin 80 MG tablet Commonly known as:  LIPITOR Take 1 tablet (80 mg total) by mouth daily at 6 PM. What changed:  how much to take   carvedilol 25 MG tablet Commonly known as:  COREG Take 1 tablet (25 mg total) by mouth 2 (two) times daily.   clopidogrel 75 MG tablet Commonly known as:  PLAVIX Take 1 tablet (75 mg total) by mouth daily with breakfast.   glucose blood test strip Check BS bid DX: E11.9   hydrALAZINE 25 MG tablet Commonly known as:  APRESOLINE Take 1 tablet (25 mg total) by mouth 2 (two) times daily.   Insulin Glargine 100 UNIT/ML Solostar Pen Commonly known as:  LANTUS Inject 45 Units into the skin daily.   Insulin Syringe-Needle U-100 30G X 5/16" 0.5 ML Misc As needed for insulin   isosorbide mononitrate 60 MG  24 hr tablet Commonly known as:  IMDUR Take 1 tablet (60 mg total) by mouth daily.   JANUVIA 100 MG tablet Generic drug:  sitaGLIPtin TAKE 1 TABLET(100 MG) BY MOUTH DAILY What changed:  See the new instructions.   nitroGLYCERIN 0.4 MG SL tablet Commonly known as:  NITROSTAT Place 1 tablet (0.4 mg total) under the tongue every 5 (five) minutes as needed for chest pain. Do not take more than 3 nitroglycerine   ONETOUCH VERIO w/Device Kit 1 Device by Does not apply route 2 (two) times daily. E11.9         Diet and Activity recommendation: See Discharge Instructions above   Consults obtained -  None   Major procedures and Radiology Reports - PLEASE review detailed and final reports for all details, in brief -      Dg Chest 2 View  Result Date: 01/04/2018 CLINICAL DATA:  Chest pain for 2 days, initial encounter EXAM: CHEST - 2 VIEW COMPARISON:  10/23/2017 FINDINGS: Cardiac shadow is mildly enlarged but stable. Loop recorder is again seen. The lungs are well aerated bilaterally. No focal infiltrate or sizable effusion is seen. IMPRESSION: No acute abnormality noted. Electronically Signed   By: Inez Catalina M.D.   On: 01/04/2018 10:18   Ct Chest Wo Contrast  Result Date: 01/28/2018 CLINICAL DATA:  Shortness of breath. EXAM: CT CHEST WITHOUT CONTRAST TECHNIQUE: Multidetector CT imaging of the chest was performed following the standard protocol without IV contrast. COMPARISON:  Chest x-ray 01/27/2018.  Chest CT 08/24/2017. FINDINGS: Cardiovascular: Aortic atherosclerosis. Diffuse coronary artery calcifications. Aneurysmal dilatation of the ascending thoracic aorta measures 4.5 cm, stable since prior study. Mild cardiomegaly. Mediastinum/Nodes: Prominent mediastinal lymph nodes, stable since prior study. No axillary adenopathy. Lungs/Pleura: Bibasilar atelectasis. No effusions. No confluent opacities. Upper Abdomen: Moderate-sized hiatal hernia. Imaging into the upper abdomen shows no  acute findings. Musculoskeletal: Chest wall soft tissues are unremarkable. No acute bony abnormality. IMPRESSION: Diffuse coronary artery disease.  Cardiomegaly. Mildly prominent mediastinal lymph nodes, stable since prior study, likely reactive. Moderate-sized hiatal hernia. Bibasilar atelectasis. 4.5 cm ascending thoracic aortic aneurysm. Recommend semi-annual imaging followup by CTA or MRA and referral to cardiothoracic surgery if not already obtained. This recommendation follows 2010 ACCF/AHA/AATS/ACR/ASA/SCA/SCAI/SIR/STS/SVM Guidelines for the Diagnosis and Management of Patients With Thoracic Aortic Disease. Circulation. 2010; 121: U132-G401 Aortic Atherosclerosis (ICD10-I70.0). Electronically Signed   By: Rolm Baptise M.D.   On: 01/28/2018 19:35   Dg Chest Port 1 View  Result Date: 01/27/2018 CLINICAL DATA:  Shortness of breath. EXAM: PORTABLE CHEST 1 VIEW COMPARISON:  January 04, 2018 FINDINGS: Mild increased hazy opacity in left mid lower lung, asymmetric to the right. No pneumothorax. No nodule or mass. Mild cardiomegaly is stable. No other acute abnormalities. IMPRESSION: Mild hazy opacity in the left mid lower lung may represent asymmetric edema versus subtle infiltrate. Recommend clinical correlation and follow-up to resolution. Electronically Signed   By: Dorise Bullion III M.D   On: 01/27/2018 19:19   Vas [place] Lower Extremity Venous (dvt) (only [place])  Result Date: 01/28/2018  Lower Venous Study Indications: Edema.  Performing Technologist: Abram Sander RVS  Examination Guidelines: A complete evaluation includes B-mode imaging, spectral Doppler, color Doppler, and power Doppler as needed of all accessible portions of each vessel. Bilateral testing is considered an integral part of a complete examination. Limited examinations for reoccurring indications may be performed as noted.  Right Venous Findings: +---+---------------+---------+-----------+----------+-------+     CompressibilityPhasicitySpontaneityPropertiesSummary +---+---------------+---------+-----------+----------+-------+ CFVFull           Yes  Yes                          +---+---------------+---------+-----------+----------+-------+  Left Venous Findings: +---------+---------------+---------+-----------+----------+-------+          CompressibilityPhasicitySpontaneityPropertiesSummary +---------+---------------+---------+-----------+----------+-------+ CFV      Full           Yes      Yes                          +---------+---------------+---------+-----------+----------+-------+ SFJ      Full                                                 +---------+---------------+---------+-----------+----------+-------+ FV Prox  Full                                                 +---------+---------------+---------+-----------+----------+-------+ FV Mid   Full                                                 +---------+---------------+---------+-----------+----------+-------+ FV DistalFull                                                 +---------+---------------+---------+-----------+----------+-------+ PFV      Full                                                 +---------+---------------+---------+-----------+----------+-------+ POP      Full           Yes      Yes                          +---------+---------------+---------+-----------+----------+-------+ PTV      Full                                                 +---------+---------------+---------+-----------+----------+-------+ PERO     Full                                                 +---------+---------------+---------+-----------+----------+-------+    Summary: Right: No evidence of common femoral vein obstruction. Left: There is no evidence of deep vein thrombosis in the lower extremity. No cystic structure found in the popliteal fossa.  *See table(s) above for measurements  and observations. Electronically signed by Monica Martinez MD on 01/28/2018 at 3:50:54 PM.    Final     Micro Results     Recent Results (from the past 240 hour(s))  Blood  Culture (routine x 2)     Status: None (Preliminary result)   Collection Time: 01/27/18  8:06 PM  Result Value Ref Range Status   Specimen Description BLOOD RIGHT ANTECUBITAL  Final   Special Requests   Final    BOTTLES DRAWN AEROBIC AND ANAEROBIC Blood Culture results may not be optimal due to an inadequate volume of blood received in culture bottles   Culture   Final    NO GROWTH 2 DAYS Performed at [facility] [address] [place], [place] [address]    Report Status PENDING  Incomplete  Blood Culture (routine x 2)     Status: None (Preliminary result)   Collection Time: 01/27/18  8:09 PM  Result Value Ref Range Status   Specimen Description BLOOD RIGHT FOREARM  Final   Special Requests   Final    BOTTLES DRAWN AEROBIC AND ANAEROBIC Blood Culture results may not be optimal due to an inadequate volume of blood received in culture bottles   Culture   Final    NO GROWTH 2 DAYS Performed at [facility], [place] [address] [place], [place] [address]    Report Status PENDING  Incomplete       Today   Subjective:   James Mcclain today has no headache,no  abdominal pain,no new weakness tingling or numbness, feels much better  today.  Patient had chest pain overnight, intermittent, reducible by palpation  Objective:   Blood pressure (!) 175/87, pulse 90, temperature 99.6 F (37.6 C), temperature source Oral, resp. rate 19, height '5\' 9"'$  (1.753 m), weight 113 kg, SpO2 98 %.   Intake/Output Summary (Last 24 hours) at 01/29/2018 1135 Last data filed at 01/29/2018 0530 Gross per 24 hour  Intake 1186.08 ml  Output 900 ml  Net 286.08 ml    Exam Awake Alert, Oriented x 3, No new F.N deficits, Normal affect Symmetrical Chest wall movement, Good air movement bilaterally, CTAB, left upper chest  pain reproducible on palpation RRR,No Gallops,Rubs or new Murmurs, No Parasternal Heave +ve B.Sounds, Abd Soft, Non tender,No rebound -guarding or rigidity. No Cyanosis, Clubbing or edema, No new Rash or bruise  Data Review   CBC w Diff:  Lab Results  Component Value Date   WBC 13.0 (H) 01/29/2018   HGB 13.9 01/29/2018   HGB 15.1 01/27/2018   HGB 16.3 04/03/2016   HCT 51.5 01/29/2018   HCT 53.7 (H) 01/27/2018   HCT 52.4 (H) 04/03/2016   PLT 329 01/29/2018   PLT 389 01/27/2018   LYMPHOPCT 8 01/29/2018   LYMPHOPCT 14.8 04/03/2016   BANDSPCT 0 01/28/2018   MONOPCT 8 01/29/2018   MONOPCT 8.4 04/03/2016   EOSPCT 3 01/29/2018   EOSPCT 4.8 04/03/2016   BASOPCT 0 01/29/2018   BASOPCT 0.5 04/03/2016    CMP:  Lab Results  Component Value Date   NA 141 01/29/2018   NA 142 01/27/2018   NA 141 10/02/2015   K 4.0 01/29/2018   K 4.3 10/02/2015   CL 109 01/29/2018   CO2 24 01/29/2018   CO2 24 10/02/2015   BUN 25 (H) 01/29/2018   BUN 28 (H) 01/27/2018   BUN 27.1 (H) 10/02/2015   CREATININE 1.83 (H) 01/29/2018   CREATININE 1.60 (H) 07/06/2017   CREATININE 1.6 (H) 10/02/2015   PROT 6.9 01/13/2018   PROT 7.8 10/02/2015   ALBUMIN 3.8 01/13/2018   ALBUMIN 3.9 10/02/2015   BILITOT 0.6 01/13/2018   BILITOT 0.84 10/02/2015   ALKPHOS 105  01/13/2018   ALKPHOS 83 10/02/2015   AST 16 01/13/2018   AST 11 10/02/2015   ALT 16 01/13/2018   ALT 13 10/02/2015  .   Total Time in preparing paper work, data evaluation and todays exam - 58 minutes  Phillips Climes M.D on 01/29/2018 at 11:35 AM  [facility]   Office  [phone]

## 2018-01-29 NOTE — Progress Notes (Addendum)
Pt c/o sudden onset CP 5/10, non-radiating and sharp. O2 saturation 97% on RA. Two doses of nitroglycerin given and pain has been relieved. An EKG has been obtained and placed on the chart. On call NP [place] notified, no new orders at this time.   Will continue to monitor pt.

## 2018-01-29 NOTE — Telephone Encounter (Signed)
Routed to MD/RN as fyi 

## 2018-02-01 ENCOUNTER — Observation Stay (HOSPITAL_COMMUNITY)
Admission: RE | Admit: 2018-02-01 | Discharge: 2018-02-03 | Disposition: A | Discharge status: Home / Self Care | Payer: Medicare HMO | Source: Ambulatory Visit | Attending: Cardiology | Admitting: Cardiology

## 2018-02-01 ENCOUNTER — Ambulatory Visit (HOSPITAL_COMMUNITY): Payer: Medicare HMO

## 2018-02-01 ENCOUNTER — Other Ambulatory Visit: Payer: Self-pay

## 2018-02-01 ENCOUNTER — Encounter (HOSPITAL_COMMUNITY): Payer: Self-pay | Admitting: Physician Assistant

## 2018-02-01 ENCOUNTER — Ambulatory Visit (INDEPENDENT_AMBULATORY_CARE_PROVIDER_SITE_OTHER): Payer: Self-pay

## 2018-02-01 DIAGNOSIS — Z8673 Personal history of transient ischemic attack (TIA), and cerebral infarction without residual deficits: Secondary | ICD-10-CM | POA: Diagnosis not present

## 2018-02-01 DIAGNOSIS — Z9861 Coronary angioplasty status: Secondary | ICD-10-CM | POA: Diagnosis not present

## 2018-02-01 DIAGNOSIS — E1122 Type 2 diabetes mellitus with diabetic chronic kidney disease: Secondary | ICD-10-CM | POA: Diagnosis not present

## 2018-02-01 DIAGNOSIS — I25110 Atherosclerotic heart disease of native coronary artery with unstable angina pectoris: Principal | ICD-10-CM | POA: Insufficient documentation

## 2018-02-01 DIAGNOSIS — I712 Thoracic aortic aneurysm, without rupture: Secondary | ICD-10-CM | POA: Diagnosis not present

## 2018-02-01 DIAGNOSIS — R590 Localized enlarged lymph nodes: Secondary | ICD-10-CM | POA: Diagnosis not present

## 2018-02-01 DIAGNOSIS — I452 Bifascicular block: Secondary | ICD-10-CM | POA: Insufficient documentation

## 2018-02-01 DIAGNOSIS — Q211 Atrial septal defect: Secondary | ICD-10-CM | POA: Diagnosis not present

## 2018-02-01 DIAGNOSIS — R0789 Other chest pain: Secondary | ICD-10-CM | POA: Diagnosis not present

## 2018-02-01 DIAGNOSIS — I509 Heart failure, unspecified: Secondary | ICD-10-CM | POA: Diagnosis not present

## 2018-02-01 DIAGNOSIS — Z7901 Long term (current) use of anticoagulants: Secondary | ICD-10-CM | POA: Diagnosis not present

## 2018-02-01 DIAGNOSIS — N183 Chronic kidney disease, stage 3 unspecified: Secondary | ICD-10-CM | POA: Diagnosis present

## 2018-02-01 DIAGNOSIS — Z794 Long term (current) use of insulin: Secondary | ICD-10-CM | POA: Insufficient documentation

## 2018-02-01 DIAGNOSIS — E785 Hyperlipidemia, unspecified: Secondary | ICD-10-CM | POA: Diagnosis not present

## 2018-02-01 DIAGNOSIS — D751 Secondary polycythemia: Secondary | ICD-10-CM | POA: Diagnosis not present

## 2018-02-01 DIAGNOSIS — M1990 Unspecified osteoarthritis, unspecified site: Secondary | ICD-10-CM | POA: Insufficient documentation

## 2018-02-01 DIAGNOSIS — Y831 Surgical operation with implant of artificial internal device as the cause of abnormal reaction of the patient, or of later complication, without mention of misadventure at the time of the procedure: Secondary | ICD-10-CM | POA: Diagnosis not present

## 2018-02-01 DIAGNOSIS — I9589 Other hypotension: Secondary | ICD-10-CM | POA: Diagnosis not present

## 2018-02-01 DIAGNOSIS — I6389 Other cerebral infarction: Secondary | ICD-10-CM

## 2018-02-01 DIAGNOSIS — M109 Gout, unspecified: Secondary | ICD-10-CM | POA: Insufficient documentation

## 2018-02-01 DIAGNOSIS — Z91041 Radiographic dye allergy status: Secondary | ICD-10-CM | POA: Diagnosis present

## 2018-02-01 DIAGNOSIS — R0609 Other forms of dyspnea: Secondary | ICD-10-CM | POA: Diagnosis present

## 2018-02-01 DIAGNOSIS — I2510 Atherosclerotic heart disease of native coronary artery without angina pectoris: Secondary | ICD-10-CM | POA: Diagnosis not present

## 2018-02-01 DIAGNOSIS — E1151 Type 2 diabetes mellitus with diabetic peripheral angiopathy without gangrene: Secondary | ICD-10-CM | POA: Diagnosis not present

## 2018-02-01 DIAGNOSIS — I7121 Aneurysm of the ascending aorta, without rupture: Secondary | ICD-10-CM | POA: Diagnosis present

## 2018-02-01 DIAGNOSIS — Z7902 Long term (current) use of antithrombotics/antiplatelets: Secondary | ICD-10-CM | POA: Insufficient documentation

## 2018-02-01 DIAGNOSIS — I130 Hypertensive heart and chronic kidney disease with heart failure and stage 1 through stage 4 chronic kidney disease, or unspecified chronic kidney disease: Secondary | ICD-10-CM | POA: Diagnosis not present

## 2018-02-01 DIAGNOSIS — T82855A Stenosis of coronary artery stent, initial encounter: Secondary | ICD-10-CM | POA: Insufficient documentation

## 2018-02-01 DIAGNOSIS — R0600 Dyspnea, unspecified: Secondary | ICD-10-CM | POA: Diagnosis present

## 2018-02-01 DIAGNOSIS — I200 Unstable angina: Secondary | ICD-10-CM | POA: Diagnosis present

## 2018-02-01 DIAGNOSIS — E119 Type 2 diabetes mellitus without complications: Secondary | ICD-10-CM

## 2018-02-01 DIAGNOSIS — I480 Paroxysmal atrial fibrillation: Secondary | ICD-10-CM | POA: Diagnosis not present

## 2018-02-01 HISTORY — DX: Chronic kidney disease, stage 3 (moderate): N18.3

## 2018-02-01 HISTORY — DX: Thoracic aortic aneurysm, without rupture, unspecified: I71.20

## 2018-02-01 HISTORY — DX: Radiographic dye allergy status: Z91.041

## 2018-02-01 HISTORY — DX: Thoracic aortic aneurysm, without rupture: I71.2

## 2018-02-01 HISTORY — DX: Chronic kidney disease, stage 3 unspecified: N18.30

## 2018-02-01 HISTORY — DX: Paroxysmal atrial fibrillation: I48.0

## 2018-02-01 HISTORY — DX: Bifascicular block: I45.2

## 2018-02-01 HISTORY — DX: Dyspnea, unspecified: R06.00

## 2018-02-01 LAB — COMPREHENSIVE METABOLIC PANEL
ALT: 23 U/L (ref 0–44)
AST: 21 U/L (ref 15–41)
Albumin: 2.9 g/dL — ABNORMAL LOW (ref 3.5–5.0)
Alkaline Phosphatase: 69 U/L (ref 38–126)
Anion gap: 9 (ref 5–15)
BUN: 20 mg/dL (ref 8–23)
CO2: 24 mmol/L (ref 22–32)
Calcium: 8.5 mg/dL — ABNORMAL LOW (ref 8.9–10.3)
Chloride: 109 mmol/L (ref 98–111)
Creatinine, Ser: 1.48 mg/dL — ABNORMAL HIGH (ref 0.61–1.24)
GFR calc Af Amer: 54 mL/min — ABNORMAL LOW (ref >60)
GFR calc non Af Amer: 47 mL/min — ABNORMAL LOW (ref >60)
Glucose, Bld: 96 mg/dL (ref 70–99)
Potassium: 3.9 mmol/L (ref 3.5–5.1)
Sodium: 142 mmol/L (ref 135–145)
Total Bilirubin: 1 mg/dL (ref 0.3–1.2)
Total Protein: 6.5 g/dL (ref 6.5–8.1)

## 2018-02-01 LAB — CBC WITH DIFFERENTIAL/PLATELET
Abs Immature Granulocytes: 0.13 10*3/uL — ABNORMAL HIGH (ref 0.00–0.07)
Basophils Absolute: 0.1 10*3/uL (ref 0.0–0.1)
Basophils Relative: 1 %
Eosinophils Absolute: 0.4 10*3/uL (ref 0.0–0.5)
Eosinophils Relative: 4 %
HCT: 53.1 % — ABNORMAL HIGH (ref 39.0–52.0)
Hemoglobin: 14.4 g/dL (ref 13.0–17.0)
Immature Granulocytes: 1 %
Lymphocytes Relative: 16 %
Lymphs Abs: 1.6 10*3/uL (ref 0.7–4.0)
MCH: 19.9 pg — ABNORMAL LOW (ref 26.0–34.0)
MCHC: 27.1 g/dL — ABNORMAL LOW (ref 30.0–36.0)
MCV: 73.5 fL — ABNORMAL LOW (ref 80.0–100.0)
Monocytes Absolute: 0.8 10*3/uL (ref 0.1–1.0)
Monocytes Relative: 8 %
Neutro Abs: 7.1 10*3/uL (ref 1.7–7.7)
Neutrophils Relative %: 70 %
Platelets: 380 10*3/uL (ref 150–400)
RBC: 7.22 MIL/uL — ABNORMAL HIGH (ref 4.22–5.81)
RDW: 23.8 % — ABNORMAL HIGH (ref 11.5–15.5)
WBC: 9.9 10*3/uL (ref 4.0–10.5)
nRBC: 0.4 % — ABNORMAL HIGH (ref 0.0–0.2)

## 2018-02-01 LAB — GLUCOSE, CAPILLARY
Glucose-Capillary: 104 mg/dL — ABNORMAL HIGH (ref 70–99)
Glucose-Capillary: 146 mg/dL — ABNORMAL HIGH (ref 70–99)

## 2018-02-01 LAB — CULTURE, BLOOD (ROUTINE X 2)
Culture: NO GROWTH
Culture: NO GROWTH

## 2018-02-01 MED ORDER — MUPIROCIN 2 % EX OINT
1.0000 "application " | TOPICAL_OINTMENT | Freq: Two times a day (BID) | CUTANEOUS | Status: DC
  Administered 2018-02-01 – 2018-02-02 (×2): 1 via NASAL
  Filled 2018-02-01: qty 22

## 2018-02-01 MED ORDER — ALBUTEROL SULFATE HFA 108 (90 BASE) MCG/ACT IN AERS: 2.0000 | INHALATION_SPRAY | Freq: Four times a day (QID) | RESPIRATORY_TRACT | Status: DC | PRN

## 2018-02-01 MED ORDER — INSULIN GLARGINE 100 UNIT/ML ~~LOC~~ SOLN
45.0000 [IU] | Freq: Every day | SUBCUTANEOUS | Status: DC
  Administered 2018-02-02: 22:00:00 45 [IU] via SUBCUTANEOUS
  Filled 2018-02-01: qty 0.45

## 2018-02-01 MED ORDER — SODIUM CHLORIDE 0.9 % IV SOLN
INTRAVENOUS | Status: DC
  Administered 2018-02-01: 20:00:00 via INTRAVENOUS
  Administered 2018-02-02: 76 mL/h via INTRAVENOUS

## 2018-02-01 MED ORDER — SODIUM CHLORIDE 0.9% FLUSH
3.0000 mL | Freq: Two times a day (BID) | INTRAVENOUS | Status: DC
  Administered 2018-02-01 – 2018-02-02 (×3): 3 mL via INTRAVENOUS

## 2018-02-01 MED ORDER — LINAGLIPTIN 5 MG PO TABS: 5.0000 mg | ORAL_TABLET | Freq: Every day | ORAL | Status: DC

## 2018-02-01 MED ORDER — ONDANSETRON HCL 4 MG/2ML IJ SOLN: 4.0000 mg | Freq: Four times a day (QID) | INTRAMUSCULAR | Status: DC | PRN

## 2018-02-01 MED ORDER — ACETAMINOPHEN 325 MG PO TABS: 650.0000 mg | ORAL_TABLET | ORAL | Status: DC | PRN

## 2018-02-01 MED ORDER — SODIUM CHLORIDE 0.9% FLUSH: 3.0000 mL | INTRAVENOUS | Status: DC | PRN

## 2018-02-01 MED ORDER — SODIUM CHLORIDE 0.9% FLUSH
3.0000 mL | Freq: Two times a day (BID) | INTRAVENOUS | Status: DC
  Administered 2018-02-01: 3 mL via INTRAVENOUS

## 2018-02-01 MED ORDER — PREDNISONE 50 MG PO TABS
50.0000 mg | ORAL_TABLET | Freq: Four times a day (QID) | ORAL | Status: AC
  Administered 2018-02-01 – 2018-02-02 (×3): 50 mg via ORAL
  Filled 2018-02-01 (×3): qty 1

## 2018-02-01 MED ORDER — DIPHENHYDRAMINE HCL 25 MG PO CAPS
50.0000 mg | ORAL_CAPSULE | Freq: Once | ORAL | Status: AC
  Administered 2018-02-02: 50 mg via ORAL
  Filled 2018-02-01: qty 2

## 2018-02-01 MED ORDER — CARVEDILOL 12.5 MG PO TABS
25.0000 mg | ORAL_TABLET | Freq: Two times a day (BID) | ORAL | Status: DC
  Administered 2018-02-01 – 2018-02-03 (×4): 25 mg via ORAL
  Filled 2018-02-01 (×2): qty 2
  Filled 2018-02-01 (×2): qty 1

## 2018-02-01 MED ORDER — SODIUM CHLORIDE 0.9 % IV SOLN: 250.0000 mL | INTRAVENOUS | Status: DC | PRN

## 2018-02-01 MED ORDER — ATORVASTATIN CALCIUM 80 MG PO TABS
80.0000 mg | ORAL_TABLET | Freq: Every day | ORAL | Status: DC
  Administered 2018-02-02: 80 mg via ORAL
  Filled 2018-02-01 (×2): qty 1

## 2018-02-01 MED ORDER — INSULIN GLARGINE 100 UNIT/ML ~~LOC~~ SOLN
22.0000 [IU] | Freq: Every day | SUBCUTANEOUS | Status: AC
  Administered 2018-02-01: 22 [IU] via SUBCUTANEOUS
  Filled 2018-02-01: qty 0.22

## 2018-02-01 MED ORDER — AMLODIPINE BESYLATE 10 MG PO TABS
10.0000 mg | ORAL_TABLET | Freq: Every day | ORAL | Status: DC
  Administered 2018-02-02 – 2018-02-03 (×2): 10 mg via ORAL
  Filled 2018-02-01 (×2): qty 1

## 2018-02-01 MED ORDER — ASPIRIN 81 MG PO CHEW
81.0000 mg | CHEWABLE_TABLET | ORAL | Status: AC
  Administered 2018-02-02: 81 mg via ORAL
  Filled 2018-02-01: qty 1

## 2018-02-01 MED ORDER — DIPHENHYDRAMINE HCL 50 MG/ML IJ SOLN: 50.0000 mg | Freq: Once | INTRAMUSCULAR | Status: AC

## 2018-02-01 MED ORDER — NITROGLYCERIN 0.4 MG SL SUBL: 0.4000 mg | SUBLINGUAL_TABLET | SUBLINGUAL | Status: DC | PRN

## 2018-02-01 MED ORDER — ALBUTEROL SULFATE (2.5 MG/3ML) 0.083% IN NEBU: 2.5000 mg | INHALATION_SOLUTION | Freq: Four times a day (QID) | RESPIRATORY_TRACT | Status: DC | PRN

## 2018-02-01 MED ORDER — HYDRALAZINE HCL 25 MG PO TABS
25.0000 mg | ORAL_TABLET | Freq: Two times a day (BID) | ORAL | Status: DC
  Administered 2018-02-01 – 2018-02-02 (×2): 25 mg via ORAL
  Filled 2018-02-01 (×2): qty 1

## 2018-02-01 MED ORDER — INSULIN ASPART 100 UNIT/ML ~~LOC~~ SOLN
0.0000 [IU] | Freq: Three times a day (TID) | SUBCUTANEOUS | Status: DC
  Administered 2018-02-02: 2 [IU] via SUBCUTANEOUS
  Administered 2018-02-02: 5 [IU] via SUBCUTANEOUS
  Administered 2018-02-03: 3 [IU] via SUBCUTANEOUS

## 2018-02-01 MED ORDER — ISOSORBIDE MONONITRATE ER 60 MG PO TB24
60.0000 mg | ORAL_TABLET | Freq: Every day | ORAL | Status: DC
  Administered 2018-02-02 – 2018-02-03 (×2): 60 mg via ORAL
  Filled 2018-02-01 (×2): qty 1

## 2018-02-01 MED ORDER — CLOPIDOGREL BISULFATE 75 MG PO TABS
75.0000 mg | ORAL_TABLET | Freq: Every day | ORAL | Status: DC
  Administered 2018-02-02 – 2018-02-03 (×2): 75 mg via ORAL
  Filled 2018-02-01 (×2): qty 1

## 2018-02-01 MED ORDER — ALLOPURINOL 100 MG PO TABS
100.0000 mg | ORAL_TABLET | Freq: Every day | ORAL | Status: DC
  Administered 2018-02-02 – 2018-02-03 (×2): 100 mg via ORAL
  Filled 2018-02-01 (×2): qty 1

## 2018-02-01 NOTE — Plan of Care (Signed)
Patient admitted for cath in AM, introduction to room and environment.  Patient will plan to be NPO after MN tonight, Please refer to flowsheet for full assessment.  No complaints of chest pain, +dyspnea on exertion.  Will continue to monitor closely

## 2018-02-01 NOTE — Progress Notes (Addendum)
Pt reports having taken his Plavix, carvedillol, hydralazine today. Last dose of Eliquis yesterday PM He indicates he takes 45 units of insulin (Lantus) at night so will do 22 units tonight, then 45 starting tomorrow night Ziyah Cordoba PA-C

## 2018-02-01 NOTE — Consult Note (Signed)
            [facility] CM Primary Care Navigator  02/01/2018  James Mcclain [birth year] [id number]   Attempt to seepatient at the bedside to identify possible discharge needs buthe was already discharged home.  Per MD note, patient was admitted with acute respiratory distress. (unstable angina, hypertensive urgency)  Patient has discharge instruction to follow-up withprimary care provider in 7 days.  Primary care provider listed on Epic is Gates Rigg, NP with [place] at [facility] (not under [place]). Membership roster (APL) used to verify non-eligible status.   For additional questions please contact:  Edwena Felty A. Nekeya Briski, BSN, RN-BC [facility] PRIMARY CARE Navigator Cell: [phone]

## 2018-02-01 NOTE — H&P (Addendum)
Cardiology History & Physical    Patient ID: James Mcclain MRN: [id number], DOB: 1945-06-19 Date of Encounter: 02/01/2018, 4:32 PM Primary Physician: [name], Dolores Primary Cardiologist: Kirk Ruths, MD Primary Electrophysiologist:  None  Chief Complaint: shortness of breath Reason for Admission: planned cath for possible angina Requesting BB:CWUG Kilroy PA-C  HPI: James Mcclain is a 72 y.o. male with history of CAD (s/p DES to LAD 2014, 08/2017 DES to RI), 4.5cm thoracic aortic aneurysm by CT 01/2018, CVA 2015 with PAF documented on ILR, bifascicular block (RBBB/LAFB), IDDM, CKD stage III,  HTN, prostate CA, PVCs, MAT 2015, PFO, polycythemia who presented for planned cath.  His last cath was July 2019 during admission for unstable angina. Stent to ramus intermedius was placed. Cath otherwise showed 35% ISR of LAD, 50% prox-mid Cx, 50% then 85% RI, 50% RCA. 2D echo 08/24/17 showed EF 50-55%, rapid afib during study, normal diastolic function. He was supposed to be on ASA, Eliquis and Plavix for 2 weeks then drop ASA, with Plavis and Eliquis for 6 months. He has had several encounters in the ER since that time with chest pain and SOB. At some point post PCI in July there was some mixup about his medications and he was not taking Plavix for 3 weeks or so. He began having worsening dyspnea about a month after his PCI. He was just seen in the emergency room on 01/04/2018 again with shortness of breath. He was seen by Dr. Martinique who felt he was stable for discharge. He suggested that the patient continued to have symptoms that he would most likely need a diagnostic catheterization. He did not think a Myoview would be helpful as his pre-PCI Myoview was negative. The patient saw Kerin Ransom PA-C in follow-up 12/4 and outpatient cath was recommended with admission for pre-cath hydration. However, the patient was admitted later that day 12/4-12/6 with acute respiratory distress in setting of  hypertensive urgency with BP 210/100. CXR was notable for possible asymmetric edema or pneumonia. CT chest showed diffuse coronary disease, cardiomegaly, bibasilar atelectasis, 4.5cm ascending thoracic aortic aneurysm, aortic athero, mildly prominent mediastinal lymph nodes, stable since prior study, likely reactive. PNA was felt to be ruled out. BNP was 322. Lisinopril was stopped, Imdur was increased and he was told to take last dose of Eliquis 12/8. Discharge summary indicates IM spoke with cardiology who planned to add a RHC to procedure. Last labs on 12/6 include Cr 1.83, WBC 13, Hgb 13.9.  The patient feels well today at rest. He does note continued DOE walking down the hallway to get to his room. No chest pain. No edema. He does report mild orthopnea but states this has been present for while. CHF is listed under PMH but not reported in cardiology notes.  Past Medical History:  Diagnosis Date  . Arthritis    "right hand" (09/21/2017)  . CHF (congestive heart failure) ([place])   . CKD (chronic kidney disease), stage III ([place])   . Contrast media allergy   . Coronary artery disease    a. s/p Xience DES to the LAD 07/2012. b. 09/21/17 Synergy stent to RI.   . Dilated aortic root ([place])    a. Mildly dilated by echo 09/2013, f/u MRA scheduled for 12/2014.  [name] Elevated PSA   . Essential hypertension   . Gout    "on daily RX" (09/21/2017)  . Heart murmur   . History of kidney stones   . Hyperlipidemia   . Insulin  dependent diabetes mellitus ([place])   . 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 On tele 09/2013.  [name] PAF (paroxysmal atrial fibrillation) ([place])   . Paroxysmal atrial fibrillation (HCC)   . PFO (patent foramen ovale)    a. By echo 09/2013.  [name] Polycythemia   . Prostate cancer ([place]) 07/15/12   Gleason 7, external beam radiation Tx, Dr. Pilar Jarvis  . PVC's (premature ventricular contractions)   . 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 Stroke [facility])    a.  Cryptogenic, 09/2013. Small PFO noted on echo but neg LE duplex. PAF eventually seen on monitor.  . Thoracic aortic aneurysm ([place])    a. 4.5cm by CT 01/2018.     Surgical History:  Past Surgical History:  Procedure Laterality Date  . COLONOSCOPY WITH PROPOFOL N/A 02/22/2016   Procedure: COLONOSCOPY WITH PROPOFOL;  Surgeon: Carol Ada, MD;  Location: [facility] ENDOSCOPY;  Service: Endoscopy;  Laterality: N/A;  . CORONARY ANGIOPLASTY WITH STENT PLACEMENT  08/05/2012   LAD, 1 stent  . CORONARY STENT INTERVENTION N/A 09/21/2017   Procedure: CORONARY STENT INTERVENTION;  Surgeon: Belva Crome, MD;  Location: [place] CV LAB;  Service: Cardiovascular;  Laterality: N/A;  . EP IMPLANTABLE DEVICE N/A 08/23/2014   Procedure: Loop Recorder Insertion;  Surgeon: Evans Lance, MD;  Location: [place] CV LAB;  Service: Cardiovascular;  Laterality: N/A;  . INSERTION PROSTATE RADIATION SEED    . JOINT REPLACEMENT    . LEFT HEART CATH AND CORONARY ANGIOGRAPHY N/A 09/21/2017   Procedure: LEFT HEART CATH AND CORONARY ANGIOGRAPHY;  Surgeon: Belva Crome, MD;  Location: [place] CV LAB;  Service: Cardiovascular;  Laterality: N/A;  . LITHOTRIPSY  2006  . PERCUTANEOUS CORONARY STENT INTERVENTION (PCI-S) N/A 08/05/2012   Procedure: PERCUTANEOUS CORONARY STENT INTERVENTION (PCI-S);  Surgeon: Sherren Mocha, MD;  Location: [facility] CATH LAB;  Service: Cardiovascular;  Laterality: N/A;  . PROSTATE BIOPSY  07/15/2012; 09/23/2013  . TEE WITHOUT CARDIOVERSION N/A 09/27/2013   Procedure: TRANSESOPHAGEAL ECHOCARDIOGRAM (TEE);  Surgeon: Larey Dresser, MD;  Location: [place];  Service: Cardiovascular;  Laterality: N/A;  . TONSILLECTOMY  1990s  . TOTAL KNEE ARTHROPLASTY  01/24/2011   Procedure: TOTAL KNEE ARTHROPLASTY;  Surgeon: Alta Corning;  Location: [place];  Service: Orthopedics;  Laterality: Left;  COMPUTER ASSISTED LEFT  TOTAL KNEE REPLACEMENT. Anesthesia a combination of regional and general.     Home  Meds: Prior to Admission medications   Medication Sig Start Date End Date Taking? Authorizing Provider  albuterol (PROVENTIL HFA;VENTOLIN HFA) 108 (90 Base) MCG/ACT inhaler Inhale 2 puffs into the lungs every 6 (six) hours as needed for wheezing or shortness of breath. 08/28/17   Susy Frizzle, MD  allopurinol (ZYLOPRIM) 100 MG tablet TAKE TWO TABLETS BY MOUTH ONCE DAILY Patient taking differently: Take 100 mg by mouth daily.  02/26/17   Susy Frizzle, MD  amLODipine (NORVASC) 10 MG tablet Take 1 tablet (10 mg total) by mouth daily. 09/19/16   Susy Frizzle, MD  apixaban (ELIQUIS) 5 MG TABS tablet Take 1 tablet (5 mg total) by mouth 2 (two) times daily. 01/18/18   Lelon Perla, MD  atorvastatin (LIPITOR) 80 MG tablet Take 1 tablet (80 mg total) by mouth daily at 6 PM. Patient taking differently: Take 40 mg by mouth daily at 6 PM.  09/22/17   Daune Perch, NP  Blood Glucose Monitoring Suppl (ONETOUCH VERIO) w/Device KIT 1 Device by  Does not apply route 2 (two) times daily. E11.9 08/28/16   Susy Frizzle, MD  carvedilol (COREG) 25 MG tablet Take 1 tablet (25 mg total) by mouth 2 (two) times daily. 09/22/17 09/22/18  Daune Perch, NP  clopidogrel (PLAVIX) 75 MG tablet Take 1 tablet (75 mg total) by mouth daily with breakfast. 11/12/17   Lelon Perla, MD  glucose blood test strip Check BS bid DX: E11.9 08/28/16   Susy Frizzle, MD  hydrALAZINE (APRESOLINE) 25 MG tablet Take 1 tablet (25 mg total) by mouth 2 (two) times daily. 10/07/17 01/27/18  Almyra Deforest, PA  Insulin Glargine (LANTUS SOLOSTAR) 100 UNIT/ML Solostar Pen Inject 45 Units into the skin daily. 08/18/16   Susy Frizzle, MD  Insulin Syringe-Needle U-100 (SAFETY INSULIN SYRINGES) 30G X 5/16" 0.5 ML MISC As needed for insulin 06/11/17   Susy Frizzle, MD  isosorbide mononitrate (IMDUR) 60 MG 24 hr tablet Take 1 tablet (60 mg total) by mouth daily. 01/27/18 01/27/19  Kilroy, Luke K, PA-C  JANUVIA 100 MG tablet TAKE 1  TABLET(100 MG) BY MOUTH DAILY Patient taking differently: Take 100 mg by mouth daily.  10/05/17   Susy Frizzle, MD  nitroGLYCERIN (NITROSTAT) 0.4 MG SL tablet Place 1 tablet (0.4 mg total) under the tongue every 5 (five) minutes as needed for chest pain. Do not take more than 3 nitroglycerine 10/23/17 10/23/18  Almyra Deforest, PA    Allergies:  Allergies  Allergen Reactions  . Betadine [Povidone Iodine] Other (See Comments)    Patient doesn't remember  . Contrast Media [Iodinated Diagnostic Agents] Other (See Comments)    Patient doesn't remember  . Fish Allergy Swelling    Social History   Socioeconomic History  . Marital status: Widowed    Spouse name: Not on file  . Number of children: 3  . Years of education: 12th  . Highest education level: Not on file  Occupational History  . Occupation: retired  Scientific laboratory technician  . Financial resource strain: Not on file  . Food insecurity:    Worry: Not on file    Inability: Not on file  . Transportation needs:    Medical: Not on file    Non-medical: Not on file  Tobacco Use  . Smoking status: Never Smoker  . Smokeless tobacco: Never Used  Substance and Sexual Activity  . Alcohol use: Not Currently  . Drug use: Never  . Sexual activity: Not Currently  Lifestyle  . Physical activity:    Days per week: Not on file    Minutes per session: Not on file  . Stress: Not on file  Relationships  . Social connections:    Talks on phone: Not on file    Gets together: Not on file    Attends religious service: Not on file    Active member of club or organization: Not on file    Attends meetings of clubs or organizations: Not on file    Relationship status: Not on file  . Intimate partner violence:    Fear of current or ex partner: Not on file    Emotionally abused: Not on file    Physically abused: Not on file    Forced sexual activity: Not on file  Other Topics Concern  . Not on file  Social History Narrative   Patient lives at home  with his wife   Patient is right handed   Patient drinks coffee and sodas     Family History  Problem Relation Age of Onset  . Leukemia Mother   . Cancer Mother   . Heart Problems Father   . Diabetes Sister        ? father  . Heart Problems Brother   . Heart Problems Unknown        family history  . Stroke Unknown   . Stroke Brother   . Hypertension Brother   . Cervical cancer Sister        ????  . Heart attack Neg Hx     Review of Systems: All other systems reviewed and are otherwise negative except as noted above.  Labs:   Lab Results  Component Value Date   WBC 13.0 (H) 01/29/2018   HGB 13.9 01/29/2018   HCT 51.5 01/29/2018   MCV 74.0 (L) 01/29/2018   PLT 329 01/29/2018    Recent Labs  Lab 01/29/18 0243  NA 141  K 4.0  CL 109  CO2 24  BUN 25*  CREATININE 1.83*  CALCIUM 7.9*  GLUCOSE 145*   No results for input(s): CKTOTAL, CKMB, TROPONINI in the last 72 hours. Lab Results  Component Value Date   CHOL 104 01/13/2018   HDL 31 (L) 01/13/2018   LDLCALC 55 01/13/2018   TRIG 90 01/13/2018   Lab Results  Component Value Date   DDIMER 0.34 05/17/2014    Radiology/Studies:  Dg Chest 2 View  Result Date: 01/04/2018 CLINICAL DATA:  Chest pain for 2 days, initial encounter EXAM: CHEST - 2 VIEW COMPARISON:  10/23/2017 FINDINGS: Cardiac shadow is mildly enlarged but stable. Loop recorder is again seen. The lungs are well aerated bilaterally. No focal infiltrate or sizable effusion is seen. IMPRESSION: No acute abnormality noted. Electronically Signed   By: Inez Catalina M.D.   On: 01/04/2018 10:18   Ct Chest Wo Contrast  Result Date: 01/28/2018 CLINICAL DATA:  Shortness of breath. EXAM: CT CHEST WITHOUT CONTRAST TECHNIQUE: Multidetector CT imaging of the chest was performed following the standard protocol without IV contrast. COMPARISON:  Chest x-ray 01/27/2018.  Chest CT 08/24/2017. FINDINGS: Cardiovascular: Aortic atherosclerosis. Diffuse coronary artery  calcifications. Aneurysmal dilatation of the ascending thoracic aorta measures 4.5 cm, stable since prior study. Mild cardiomegaly. Mediastinum/Nodes: Prominent mediastinal lymph nodes, stable since prior study. No axillary adenopathy. Lungs/Pleura: Bibasilar atelectasis. No effusions. No confluent opacities. Upper Abdomen: Moderate-sized hiatal hernia. Imaging into the upper abdomen shows no acute findings. Musculoskeletal: Chest wall soft tissues are unremarkable. No acute bony abnormality. IMPRESSION: Diffuse coronary artery disease.  Cardiomegaly. Mildly prominent mediastinal lymph nodes, stable since prior study, likely reactive. Moderate-sized hiatal hernia. Bibasilar atelectasis. 4.5 cm ascending thoracic aortic aneurysm. Recommend semi-annual imaging followup by CTA or MRA and referral to cardiothoracic surgery if not already obtained. This recommendation follows 2010 ACCF/AHA/AATS/ACR/ASA/SCA/SCAI/SIR/STS/SVM Guidelines for the Diagnosis and Management of Patients With Thoracic Aortic Disease. Circulation. 2010; 121: W580-D983 Aortic Atherosclerosis (ICD10-I70.0). Electronically Signed   By: Rolm Baptise M.D.   On: 01/28/2018 19:35   Dg Chest Port 1 View  Result Date: 01/27/2018 CLINICAL DATA:  Shortness of breath. EXAM: PORTABLE CHEST 1 VIEW COMPARISON:  January 04, 2018 FINDINGS: Mild increased hazy opacity in left mid lower lung, asymmetric to the right. No pneumothorax. No nodule or mass. Mild cardiomegaly is stable. No other acute abnormalities. IMPRESSION: Mild hazy opacity in the left mid lower lung may represent asymmetric edema versus subtle infiltrate. Recommend clinical correlation and follow-up to resolution. Electronically Signed   By: Dorise Bullion III M.D   On:  01/27/2018 19:19   Vas [place] Lower Extremity Venous (dvt) (only [facility])  Result Date: 01/28/2018  Lower Venous Study Indications: Edema.  Performing Technologist: Abram Sander RVS  Examination Guidelines: A complete evaluation  includes B-mode imaging, spectral Doppler, color Doppler, and power Doppler as needed of all accessible portions of each vessel. Bilateral testing is considered an integral part of a complete examination. Limited examinations for reoccurring indications may be performed as noted.  Right Venous Findings: +---+---------------+---------+-----------+----------+-------+    CompressibilityPhasicitySpontaneityPropertiesSummary +---+---------------+---------+-----------+----------+-------+ CFVFull           Yes      Yes                          +---+---------------+---------+-----------+----------+-------+  Left Venous Findings: +---------+---------------+---------+-----------+----------+-------+          CompressibilityPhasicitySpontaneityPropertiesSummary +---------+---------------+---------+-----------+----------+-------+ CFV      Full           Yes      Yes                          +---------+---------------+---------+-----------+----------+-------+ SFJ      Full                                                 +---------+---------------+---------+-----------+----------+-------+ FV Prox  Full                                                 +---------+---------------+---------+-----------+----------+-------+ FV Mid   Full                                                 +---------+---------------+---------+-----------+----------+-------+ FV DistalFull                                                 +---------+---------------+---------+-----------+----------+-------+ PFV      Full                                                 +---------+---------------+---------+-----------+----------+-------+ POP      Full           Yes      Yes                          +---------+---------------+---------+-----------+----------+-------+ PTV      Full                                                  +---------+---------------+---------+-----------+----------+-------+ PERO     Full                                                 +---------+---------------+---------+-----------+----------+-------+  Summary: Right: No evidence of common femoral vein obstruction. Left: There is no evidence of deep vein thrombosis in the lower extremity. No cystic structure found in the popliteal fossa.  *See table(s) above for measurements and observations. Electronically signed by Monica Martinez MD on 01/28/2018 at 3:50:54 PM.    Final    Wt Readings from Last 3 Encounters:  02/01/18 111.2 kg  01/29/18 113 kg  01/27/18 114 kg    EKG: 01/29/18 NSR with RBBB LAFB no acute changes  Physical Exam: Blood pressure (!) 157/92, pulse 95, temperature 99 F (37.2 C), temperature source Oral, resp. rate 20, height '5\' 9"'$  (1.753 m), weight 111.2 kg, SpO2 99 %. Body mass index is 36.21 kg/m. General: Well developed, well nourished AAM in no acute distress. Head: Normocephalic, atraumatic, sclera non-icteric, no xanthomas, nares are without discharge.  Neck: Negative for carotid bruits. JVD not elevated. Lungs: Mildly diminished throughout. No wheezes, rales, or rhonchi. Breathing is unlabored. Heart: RRR occasional ectopy with S1 S2. No murmurs, rubs, or gallops appreciated. Abdomen: Soft, non-tender, non-distended with normoactive bowel sounds. No hepatomegaly. No rebound/guarding. No obvious abdominal masses. Msk:  Strength and tone appear normal for age. Extremities: No clubbing or cyanosis. No edema.  Distal pedal pulses are 2+ and equal bilaterally. Neuro: Alert and oriented X 3. No focal deficit. No facial asymmetry. Moves all extremities spontaneously. Psych:  Responds to questions appropriately with a normal affect.    Assessment and Plan   1. CAD with worsening dyspnea concerning for [place] - plan R&LHC in AM. CT chest the other day did not show any vascular overload or pleural effusions. Per  discussion with MD, hydrate gently with 75cc/hr. Continue to hold ACEI. Risks and benefits of cardiac catheterization have been discussed with the patient.  These include bleeding, infection, kidney damage, stroke, heart attack, death.  The patient understands these risks and is willing to proceed. The patient previously carries diagnosis of contrast allergy but cannot recall details. Nurse notes from 08/2017 indicates family had previously mentioned requiring premedication. He was premedicated prior to 08/2017 study. We will pre-medicate with 13 hour protocol.  2. Contrast allergy - as above.  3. Thoracic aortic aneurysm - Dr. Stanford Breed plans repeat study in 1 year which would now be 01/2019. Aneurysm precautions discussed with pt.  4. CKD stage III - follow as above. Hydrate pre-cath.  5. Paroxysmal atrial fibrillation - maintaining NSR. Hold Eliquis pre-cath.  6. Insulin dependent diabetes - will give 1/2 dose insulin in prep for procedure and add SSI/CBG checks.  Severity of Illness: The appropriate patient status for this patient is OBSERVATION. Observation status is judged to be reasonable and necessary in order to provide the required intensity of service to ensure the patient's safety. The patient's presenting symptoms, physical exam findings, and initial radiographic and laboratory data in the context of their medical condition is felt to place them at decreased risk for further clinical deterioration. Furthermore, it is anticipated that the patient will be medically stable for discharge from the hospital within 2 midnights of admission. The following factors support the patient status of observation.   " The patient's presenting symptoms include dyspnea. " The physical exam findings include occ ectopy. " The initial radiographic and laboratory data are notable for elevated creatinine.    For questions or updates, please contact [place] Please consult [email or web address] for contact info  under Cardiology/STEMI.  Signed, Charlie Pitter, PA-C 02/01/2018, 4:32 PM   As above, patient seen and examined.  Briefly he is a 72 year old male with past medical history of coronary artery disease status post prior PCI of LAD and more recent PCI of ramus intermedius July 2019, thoracic aortic aneurysm, prior CVA, PVD AF, chronic stage III kidney disease, diabetes mellitus, hypertension, polycythemia admitted for hydration prior to cardiac catheterization.  Last echocardiogram July 2019 showed ejection fraction 50 to 55%.  He has been recently seen for atypical chest pain and then more concerning increasing dyspnea on exertion.  He was seen in the office and outpatient cardiac catheterization was recommended.  He now presents for admission.  Presently pain-free.  Last creatinine from December 4 was 1.81.  1 atypical chest pain with more concerning dyspnea on exertion-plan is for right and left cardiac catheterization tomorrow to exclude restenosis and evaluate right heart pressures.  The risks and benefit of cardiac catheterization including myocardial infarction, CVA, death and worsening renal insufficiency discussed and he agrees to proceed.  ACE inhibitor and apixaban have been held in anticipation of catheterization.  Hydrate with normal saline prior to procedure.  He carries a dye allergy diagnosis and we will therefore premedicate.  Limit dye.  No ventriculogram.  2 chronic stage III kidney disease-as outlined above we will gently hydrate prior to catheterization with 75 cc of saline per hour.  Limit dye.  No ventriculogram.  Follow renal function after procedure.  3 coronary artery disease-plan to continue Plavix and statin.  4 paroxysmal atrial fibrillation-plan to continue carvedilol for rate control if atrial fibrillation recurs.  Resume apixaban once all procedures complete. CHADSvasc 7.  5 hypertension-follow blood pressure and adjust regimen as needed.  6 thoracic aortic aneurysm-4.5 cm  on recent CT.  He will need follow-up study December 2020.  7 polycythemia  Kirk Ruths, MD

## 2018-02-02 ENCOUNTER — Ambulatory Visit (HOSPITAL_COMMUNITY): Admission: RE | Admit: 2018-02-02 | Payer: Medicare HMO | Source: Home / Self Care | Admitting: Cardiology

## 2018-02-02 ENCOUNTER — Encounter (HOSPITAL_COMMUNITY)
Admission: RE | Disposition: A | Discharge status: Home / Self Care | Payer: Self-pay | Source: Ambulatory Visit | Attending: Cardiology

## 2018-02-02 ENCOUNTER — Encounter (HOSPITAL_COMMUNITY): Payer: Self-pay | Admitting: Cardiology

## 2018-02-02 DIAGNOSIS — I25110 Atherosclerotic heart disease of native coronary artery with unstable angina pectoris: Secondary | ICD-10-CM

## 2018-02-02 DIAGNOSIS — I9589 Other hypotension: Secondary | ICD-10-CM | POA: Diagnosis not present

## 2018-02-02 DIAGNOSIS — R0609 Other forms of dyspnea: Secondary | ICD-10-CM

## 2018-02-02 DIAGNOSIS — E785 Hyperlipidemia, unspecified: Secondary | ICD-10-CM | POA: Diagnosis not present

## 2018-02-02 DIAGNOSIS — I130 Hypertensive heart and chronic kidney disease with heart failure and stage 1 through stage 4 chronic kidney disease, or unspecified chronic kidney disease: Secondary | ICD-10-CM | POA: Diagnosis not present

## 2018-02-02 DIAGNOSIS — N183 Chronic kidney disease, stage 3 (moderate): Secondary | ICD-10-CM | POA: Diagnosis not present

## 2018-02-02 DIAGNOSIS — I509 Heart failure, unspecified: Secondary | ICD-10-CM | POA: Diagnosis not present

## 2018-02-02 DIAGNOSIS — T82855A Stenosis of coronary artery stent, initial encounter: Secondary | ICD-10-CM | POA: Diagnosis not present

## 2018-02-02 DIAGNOSIS — R0789 Other chest pain: Secondary | ICD-10-CM

## 2018-02-02 DIAGNOSIS — I712 Thoracic aortic aneurysm, without rupture: Secondary | ICD-10-CM | POA: Diagnosis not present

## 2018-02-02 DIAGNOSIS — E1122 Type 2 diabetes mellitus with diabetic chronic kidney disease: Secondary | ICD-10-CM | POA: Diagnosis not present

## 2018-02-02 HISTORY — PX: CORONARY BALLOON ANGIOPLASTY: CATH118233

## 2018-02-02 HISTORY — PX: ULTRASOUND GUIDANCE FOR VASCULAR ACCESS: SHX6516

## 2018-02-02 HISTORY — PX: RIGHT/LEFT HEART CATH AND CORONARY ANGIOGRAPHY: CATH118266

## 2018-02-02 LAB — POCT I-STAT 3, ART BLOOD GAS (G3+)
Acid-base deficit: 4 mmol/L — ABNORMAL HIGH (ref 0.0–2.0)
Bicarbonate: 21.7 mmol/L (ref 20.0–28.0)
O2 Saturation: 97 %
TCO2: 23 mmol/L (ref 22–32)
pCO2 arterial: 39.8 mmHg (ref 32.0–48.0)
pH, Arterial: 7.344 — ABNORMAL LOW (ref 7.350–7.450)
pO2, Arterial: 95 mmHg (ref 83.0–108.0)

## 2018-02-02 LAB — POCT I-STAT 3, VENOUS BLOOD GAS (G3P V)
Acid-base deficit: 3 mmol/L — ABNORMAL HIGH (ref 0.0–2.0)
Acid-base deficit: 5 mmol/L — ABNORMAL HIGH (ref 0.0–2.0)
Bicarbonate: 20.4 mmol/L (ref 20.0–28.0)
Bicarbonate: 22.1 mmol/L (ref 20.0–28.0)
O2 Saturation: 76 %
O2 Saturation: 77 %
TCO2: 22 mmol/L (ref 22–32)
TCO2: 23 mmol/L (ref 22–32)
pCO2, Ven: 37.7 mmHg — ABNORMAL LOW (ref 44.0–60.0)
pCO2, Ven: 40.6 mmHg — ABNORMAL LOW (ref 44.0–60.0)
pH, Ven: 7.341 (ref 7.250–7.430)
pH, Ven: 7.343 (ref 7.250–7.430)
pO2, Ven: 43 mmHg (ref 32.0–45.0)
pO2, Ven: 44 mmHg (ref 32.0–45.0)

## 2018-02-02 LAB — CBC
HCT: 59.1 % — ABNORMAL HIGH (ref 39.0–52.0)
Hemoglobin: 15.5 g/dL (ref 13.0–17.0)
MCH: 19.4 pg — ABNORMAL LOW (ref 26.0–34.0)
MCHC: 26.2 g/dL — ABNORMAL LOW (ref 30.0–36.0)
MCV: 73.9 fL — ABNORMAL LOW (ref 80.0–100.0)
Platelets: 438 10*3/uL — ABNORMAL HIGH (ref 150–400)
RBC: 8 MIL/uL — ABNORMAL HIGH (ref 4.22–5.81)
RDW: 23.9 % — ABNORMAL HIGH (ref 11.5–15.5)
WBC: 10.8 10*3/uL — ABNORMAL HIGH (ref 4.0–10.5)
nRBC: 0.3 % — ABNORMAL HIGH (ref 0.0–0.2)

## 2018-02-02 LAB — BASIC METABOLIC PANEL
Anion gap: 9 (ref 5–15)
BUN: 21 mg/dL (ref 8–23)
CO2: 24 mmol/L (ref 22–32)
Calcium: 8.6 mg/dL — ABNORMAL LOW (ref 8.9–10.3)
Chloride: 108 mmol/L (ref 98–111)
Creatinine, Ser: 1.48 mg/dL — ABNORMAL HIGH (ref 0.61–1.24)
GFR calc Af Amer: 54 mL/min — ABNORMAL LOW (ref >60)
GFR calc non Af Amer: 47 mL/min — ABNORMAL LOW (ref >60)
Glucose, Bld: 173 mg/dL — ABNORMAL HIGH (ref 70–99)
Potassium: 4.3 mmol/L (ref 3.5–5.1)
Sodium: 141 mmol/L (ref 135–145)

## 2018-02-02 LAB — GLUCOSE, CAPILLARY
Glucose-Capillary: 165 mg/dL — ABNORMAL HIGH (ref 70–99)
Glucose-Capillary: 160 mg/dL — ABNORMAL HIGH (ref 70–99)
Glucose-Capillary: 291 mg/dL — ABNORMAL HIGH (ref 70–99)
Glucose-Capillary: 311 mg/dL — ABNORMAL HIGH (ref 70–99)

## 2018-02-02 LAB — SURGICAL PCR SCREEN
MRSA, PCR: NEGATIVE
Staphylococcus aureus: NEGATIVE

## 2018-02-02 LAB — POCT ACTIVATED CLOTTING TIME
Activated Clotting Time: 455 seconds
Activated Clotting Time: 224 seconds

## 2018-02-02 SURGERY — RIGHT/LEFT HEART CATH AND CORONARY ANGIOGRAPHY
Anesthesia: LOCAL

## 2018-02-02 MED ORDER — SODIUM CHLORIDE 0.9% FLUSH: 3.0000 mL | Freq: Two times a day (BID) | INTRAVENOUS | Status: DC

## 2018-02-02 MED ORDER — SODIUM CHLORIDE 0.9% FLUSH: 3.0000 mL | INTRAVENOUS | Status: DC | PRN

## 2018-02-02 MED ORDER — LIDOCAINE HCL (PF) 1 % IJ SOLN
INTRAMUSCULAR | Status: AC
  Filled 2018-02-02: qty 30

## 2018-02-02 MED ORDER — HEPARIN (PORCINE) IN NACL 1000-0.9 UT/500ML-% IV SOLN
INTRAVENOUS | Status: DC | PRN
  Administered 2018-02-02 (×2): 500 mL

## 2018-02-02 MED ORDER — SODIUM CHLORIDE 0.9 % IV SOLN: INTRAVENOUS | Status: AC

## 2018-02-02 MED ORDER — SODIUM CHLORIDE 0.9 % IV SOLN: 250.0000 mL | INTRAVENOUS | Status: DC | PRN

## 2018-02-02 MED ORDER — FENTANYL CITRATE (PF) 100 MCG/2ML IJ SOLN
INTRAMUSCULAR | Status: DC | PRN
  Administered 2018-02-02: 25 ug via INTRAVENOUS

## 2018-02-02 MED ORDER — VERAPAMIL HCL 2.5 MG/ML IV SOLN
INTRAVENOUS | Status: DC | PRN
  Administered 2018-02-02: 10 mL via INTRA_ARTERIAL

## 2018-02-02 MED ORDER — MIDAZOLAM HCL 2 MG/2ML IJ SOLN
INTRAMUSCULAR | Status: DC | PRN
  Administered 2018-02-02: 1 mg via INTRAVENOUS

## 2018-02-02 MED ORDER — MORPHINE SULFATE (PF) 2 MG/ML IV SOLN: 1.0000 mg | INTRAVENOUS | Status: DC | PRN

## 2018-02-02 MED ORDER — IOHEXOL 350 MG/ML SOLN
INTRAVENOUS | Status: DC | PRN
  Administered 2018-02-02: 155 mL via INTRAVENOUS

## 2018-02-02 MED ORDER — HEPARIN SODIUM (PORCINE) 1000 UNIT/ML IJ SOLN
INTRAMUSCULAR | Status: AC
  Filled 2018-02-02: qty 1

## 2018-02-02 MED ORDER — HYDRALAZINE HCL 20 MG/ML IJ SOLN: 5.0000 mg | INTRAMUSCULAR | Status: AC | PRN

## 2018-02-02 MED ORDER — VERAPAMIL HCL 2.5 MG/ML IV SOLN
INTRAVENOUS | Status: AC
  Filled 2018-02-02: qty 2

## 2018-02-02 MED ORDER — MIDAZOLAM HCL 2 MG/2ML IJ SOLN
INTRAMUSCULAR | Status: AC
  Filled 2018-02-02: qty 2

## 2018-02-02 MED ORDER — SODIUM CHLORIDE 0.9 % IV SOLN: INTRAVENOUS | Status: DC

## 2018-02-02 MED ORDER — FENTANYL CITRATE (PF) 100 MCG/2ML IJ SOLN
INTRAMUSCULAR | Status: AC
  Filled 2018-02-02: qty 2

## 2018-02-02 MED ORDER — LIDOCAINE HCL (PF) 1 % IJ SOLN
INTRAMUSCULAR | Status: DC | PRN
  Administered 2018-02-02: 2 mL
  Administered 2018-02-02: 5 mL

## 2018-02-02 MED ORDER — HEPARIN SODIUM (PORCINE) 1000 UNIT/ML IJ SOLN
INTRAMUSCULAR | Status: DC | PRN
  Administered 2018-02-02 (×2): 6000 [IU] via INTRAVENOUS

## 2018-02-02 MED ORDER — ANGIOPLASTY BOOK
Freq: Once | Status: AC
  Administered 2018-02-02: 22:00:00
  Filled 2018-02-02: qty 1

## 2018-02-02 MED ORDER — HEPARIN (PORCINE) IN NACL 1000-0.9 UT/500ML-% IV SOLN
INTRAVENOUS | Status: AC
  Filled 2018-02-02: qty 1000

## 2018-02-02 MED ORDER — LABETALOL HCL 5 MG/ML IV SOLN: 10.0000 mg | INTRAVENOUS | Status: AC | PRN

## 2018-02-02 SURGICAL SUPPLY — 23 items
BALLN SAPPHIRE ~~LOC~~ 3.0X12 (BALLOONS) ×1 IMPLANT
BALLN WOLVERINE 2.50X10 (BALLOONS) ×3
BALLOON WOLVERINE 2.50X10 (BALLOONS) IMPLANT
CATH BALLN WEDGE 5F 110CM (CATHETERS) ×1 IMPLANT
CATH LAUNCHER 5F EBU4.0 (CATHETERS) ×1 IMPLANT
CATH LAUNCHER 5F NOTO (CATHETERS) IMPLANT
CATH LAUNCHER 5F RADR (CATHETERS) IMPLANT
CATH OPTITORQUE TIG 4.0 5F (CATHETERS) ×1 IMPLANT
CATHETER LAUNCHER 5F NOTO (CATHETERS) ×3
CATHETER LAUNCHER 5F RADR (CATHETERS) ×3
DEVICE RAD COMP TR BAND LRG (VASCULAR PRODUCTS) ×1 IMPLANT
GLIDESHEATH SLEND SS 6F .021 (SHEATH) ×1 IMPLANT
GUIDEWIRE .025 260CM (WIRE) ×1 IMPLANT
GUIDEWIRE INQWIRE 1.5J.035X260 (WIRE) IMPLANT
INQWIRE 1.5J .035X260CM (WIRE) ×3
KIT ENCORE 26 ADVANTAGE (KITS) ×1 IMPLANT
KIT HEART LEFT (KITS) ×3 IMPLANT
PACK CARDIAC CATHETERIZATION (CUSTOM PROCEDURE TRAY) ×3 IMPLANT
SHEATH GLIDE SLENDER 4/5FR (SHEATH) ×1 IMPLANT
SHEATH PROBE COVER 6X72 (BAG) ×1 IMPLANT
TRANSDUCER W/STOPCOCK (MISCELLANEOUS) ×3 IMPLANT
TUBING CIL FLEX 10 FLL-RA (TUBING) ×3 IMPLANT
WIRE ASAHI PROWATER 180CM (WIRE) ×1 IMPLANT

## 2018-02-02 NOTE — Care Management (Signed)
#    10.   S/W MARSHAL @ [place] # [phone]   1. BRILINTA  90 MG BID COVER- YES CO-PAY- $ 47.00 TIER- 3 DRUG PRIOR APPROVAL- NO  2. EFFIENT  10 MG DAILY COVER- YES TIER- 4 DRUG PRIOR APPROVAL- YES # [phone]  3. PRASUGREL 10 MG DAILY COVER- YES CO-PAY- $ 37.08 TIER- 3 DRUG PRIOR APPROVAL- NO  PREFERRED PHARMACY : YES   - [facility]

## 2018-02-02 NOTE — Interval H&P Note (Signed)
History and Physical Interval Note:  02/02/2018 10:09 AM  James Mcclain  has presented today for surgery, with the diagnosis of cp-progressive angina.  The various methods of treatment have been discussed with the patient and family. After consideration of risks, benefits and other options for treatment, the patient has consented to  Procedure(s): RIGHT/LEFT HEART CATH AND CORONARY ANGIOGRAPHY (N/A) Ultrasound Guidance For Vascular Access with possible PERCUTANEOUS CORONARY INTERVENTION as a surgical intervention .  The patient's history has been reviewed, patient examined, no change in status, stable for surgery.  I have reviewed the patient's chart and labs.  Questions were answered to the patient's satisfaction.    Cath Lab Visit (complete for each Cath Lab visit)  Clinical Evaluation Leading to the Procedure:   ACS: No.  Non-ACS:    Anginal Classification: CCS III  Anti-ischemic medical therapy: Maximal Therapy (2 or more classes of medications)  Non-Invasive Test Results: No non-invasive testing performed  Prior CABG: No previous CABG    Glenetta Hew

## 2018-02-02 NOTE — Progress Notes (Signed)
TR BAND REMOVAL  LOCATION:    right radial  DEFLATED PER PROTOCOL:    Yes.    TIME BAND OFF / DRESSING APPLIED: 1645   SITE UPON ARRIVAL:    Level 0  SITE AFTER BAND REMOVAL:    Level 0  CIRCULATION SENSATION AND MOVEMENT:    Within Normal Limits   Yes.    COMMENTS:     

## 2018-02-02 NOTE — Care Management (Signed)
#    10.   S/W MARSHAL @ [place] # [phone]   1. BRILINTA  90 MG BID COVER- YES CO-PAY- $ 47.00 TIER- 3 DRUG PRIOR APPROVAL- NO  2. EFFIENT  10 MG DAILY COVER- YES TIER- 4 DRUG PRIOR APPROVAL- YES # [phone]  3. PRASUGREL 10 MG DAILY COVER- YES CO-PAY- $ 37.08 TIER- 3 DRUG PRIOR APPROVAL- NO  PREFERRED PHARMACY : YES   - [facility]

## 2018-02-02 NOTE — Progress Notes (Signed)
Progress Note  Patient Name: James Mcclain Date of Encounter: 02/02/2018  Primary Cardiologist: Kirk Ruths, MD   Subjective   No CP or dyspnea  Inpatient Medications    Scheduled Meds: . allopurinol  100 mg Oral Daily  . amLODipine  10 mg Oral Daily  . atorvastatin  80 mg Oral q1800  . carvedilol  25 mg Oral BID  . clopidogrel  75 mg Oral Q breakfast  . hydrALAZINE  25 mg Oral BID  . insulin aspart  0-9 Units Subcutaneous TID WC  . insulin glargine  45 Units Subcutaneous QHS  . isosorbide mononitrate  60 mg Oral Daily  . mupirocin ointment  1 application Nasal BID  . sodium chloride flush  3 mL Intravenous Q12H  . sodium chloride flush  3 mL Intravenous Q12H   Continuous Infusions: . sodium chloride    . sodium chloride    . sodium chloride 75 mL/hr at 02/01/18 2010   PRN Meds: sodium chloride, sodium chloride, acetaminophen, albuterol, nitroGLYCERIN, ondansetron (ZOFRAN) IV, sodium chloride flush, sodium chloride flush   Vital Signs    Vitals:   02/01/18 1939 02/01/18 2323 02/02/18 0420 02/02/18 0517  BP: (!) 159/86 (!) 156/94 (!) 153/97   Pulse: 86 86 87   Resp: 18 18 18    Temp: 98.6 F (37 C) 98.6 F (37 C) 97.9 F (36.6 C)   TempSrc: Oral Oral Oral   SpO2: 98% 98% 98%   Weight:    110.6 kg  Height:        Intake/Output Summary (Last 24 hours) at 02/02/2018 0909 Last data filed at 02/02/2018 0520 Gross per 24 hour  Intake 1195.56 ml  Output 1000 ml  Net 195.56 ml   Filed Weights   02/01/18 1527 02/02/18 0517  Weight: 111.2 kg 110.6 kg    Telemetry    Sinus with rare PVC- Personally Reviewed  Physical Exam   GEN: No acute distress.   Neck: No JVD Cardiac: RRR, no murmurs, rubs, or gallops.  Respiratory: Clear to auscultation bilaterally. GI: Soft, nontender, non-distended  MS: No edema Neuro:  Nonfocal  Psych: Normal affect   Labs    Chemistry Recent Labs  Lab 01/29/18 0243 02/01/18 1749 02/02/18 0458  NA 141 142 141  K  4.0 3.9 4.3  CL 109 109 108  CO2 24 24 24   GLUCOSE 145* 96 173*  BUN 25* 20 21  CREATININE 1.83* 1.48* 1.48*  CALCIUM 7.9* 8.5* 8.6*  PROT  --  6.5  --   ALBUMIN  --  2.9*  --   AST  --  21  --   ALT  --  23  --   ALKPHOS  --  69  --   BILITOT  --  1.0  --   GFRNONAA 36* 47* 47*  GFRAA 42* 54* 54*  ANIONGAP 8 9 9      Hematology Recent Labs  Lab 01/29/18 0243 02/01/18 1749 02/02/18 0458  WBC 13.0* 9.9 10.8*  RBC 6.96* 7.22* 8.00*  HGB 13.9 14.4 15.5  HCT 51.5 53.1* 59.1*  MCV 74.0* 73.5* 73.9*  MCH 20.0* 19.9* 19.4*  MCHC 27.0* 27.1* 26.2*  RDW 23.8* 23.8* 23.9*  PLT 329 380 438*    Cardiac Enzymes Recent Labs  Lab 01/27/18 2358 01/28/18 0512 01/28/18 1323 01/29/18 0243  TROPONINI <0.03 0.04* <0.03 <0.03    BNP Recent Labs  Lab 01/27/18 1845  BNP 322.6*     Patient Profile  72 year old male with past medical history of coronary artery disease status post prior PCI of LAD and more recent PCI of ramus intermedius July 2019, thoracic aortic aneurysm, prior CVA, PVD AF, chronic stage III kidney disease, diabetes mellitus, hypertension, polycythemia admitted for hydration prior to cardiac catheterization.  Last echocardiogram July 2019 showed ejection fraction 50 to 55%.  He has been recently seen for atypical chest pain and then more concerning increasing dyspnea on exertion.  He was seen in the office and outpatient cardiac catheterization was recommended.    Assessment & Plan    1 atypical chest pain with more concerning dyspnea on exertion-patient is for right and left cardiac catheterization today.  As outlined in previous note ACE inhibitor and apixaban have been held prior to procedure.  He also carries diagnosis of dye allergy and has been premedicated.  He has been hydrated prior to procedure and renal function improved this morning.    2 chronic stage III kidney disease-renal function improved this morning.  Limit dye.  No ventriculogram.  Follow  renal function after procedure.  3 coronary artery disease-plan to continue Plavix and statin.  4 paroxysmal atrial fibrillation-patient remains in sinus rhythm today.  Continue carvedilol.  Resume apixaban once all procedures complete.  5 hypertension-blood pressure elevated.  Will resume ACE inhibitor following catheterization once renal function stable.  6 thoracic aortic aneurysm-4.5 cm on recent CT.  He will need follow-up study December 2020.  7 polycythemia  For questions or updates, please contact [place] Please consult [email or web address] for contact info under        Signed, Kirk Ruths, MD  02/02/2018, 9:09 AM

## 2018-02-02 NOTE — H&P (View-Only) (Signed)
Progress Note  Patient Name: James Mcclain Date of Encounter: 02/02/2018  Primary Cardiologist: Kirk Ruths, MD   Subjective   No CP or dyspnea  Inpatient Medications    Scheduled Meds: . allopurinol  100 mg Oral Daily  . amLODipine  10 mg Oral Daily  . atorvastatin  80 mg Oral q1800  . carvedilol  25 mg Oral BID  . clopidogrel  75 mg Oral Q breakfast  . hydrALAZINE  25 mg Oral BID  . insulin aspart  0-9 Units Subcutaneous TID WC  . insulin glargine  45 Units Subcutaneous QHS  . isosorbide mononitrate  60 mg Oral Daily  . mupirocin ointment  1 application Nasal BID  . sodium chloride flush  3 mL Intravenous Q12H  . sodium chloride flush  3 mL Intravenous Q12H   Continuous Infusions: . sodium chloride    . sodium chloride    . sodium chloride 75 mL/hr at 02/01/18 2010   PRN Meds: sodium chloride, sodium chloride, acetaminophen, albuterol, nitroGLYCERIN, ondansetron (ZOFRAN) IV, sodium chloride flush, sodium chloride flush   Vital Signs    Vitals:   02/01/18 1939 02/01/18 2323 02/02/18 0420 02/02/18 0517  BP: (!) 159/86 (!) 156/94 (!) 153/97   Pulse: 86 86 87   Resp: 18 18 18    Temp: 98.6 F (37 C) 98.6 F (37 C) 97.9 F (36.6 C)   TempSrc: Oral Oral Oral   SpO2: 98% 98% 98%   Weight:    110.6 kg  Height:        Intake/Output Summary (Last 24 hours) at 02/02/2018 0909 Last data filed at 02/02/2018 0520 Gross per 24 hour  Intake 1195.56 ml  Output 1000 ml  Net 195.56 ml   Filed Weights   02/01/18 1527 02/02/18 0517  Weight: 111.2 kg 110.6 kg    Telemetry    Sinus with rare PVC- Personally Reviewed  Physical Exam   GEN: No acute distress.   Neck: No JVD Cardiac: RRR, no murmurs, rubs, or gallops.  Respiratory: Clear to auscultation bilaterally. GI: Soft, nontender, non-distended  MS: No edema Neuro:  Nonfocal  Psych: Normal affect   Labs    Chemistry Recent Labs  Lab 01/29/18 0243 02/01/18 1749 02/02/18 0458  NA 141 142 141  K  4.0 3.9 4.3  CL 109 109 108  CO2 24 24 24   GLUCOSE 145* 96 173*  BUN 25* 20 21  CREATININE 1.83* 1.48* 1.48*  CALCIUM 7.9* 8.5* 8.6*  PROT  --  6.5  --   ALBUMIN  --  2.9*  --   AST  --  21  --   ALT  --  23  --   ALKPHOS  --  69  --   BILITOT  --  1.0  --   GFRNONAA 36* 47* 47*  GFRAA 42* 54* 54*  ANIONGAP 8 9 9      Hematology Recent Labs  Lab 01/29/18 0243 02/01/18 1749 02/02/18 0458  WBC 13.0* 9.9 10.8*  RBC 6.96* 7.22* 8.00*  HGB 13.9 14.4 15.5  HCT 51.5 53.1* 59.1*  MCV 74.0* 73.5* 73.9*  MCH 20.0* 19.9* 19.4*  MCHC 27.0* 27.1* 26.2*  RDW 23.8* 23.8* 23.9*  PLT 329 380 438*    Cardiac Enzymes Recent Labs  Lab 01/27/18 2358 01/28/18 0512 01/28/18 1323 01/29/18 0243  TROPONINI <0.03 0.04* <0.03 <0.03    BNP Recent Labs  Lab 01/27/18 1845  BNP 322.6*     Patient Profile  72 year old male with past medical history of coronary artery disease status post prior PCI of LAD and more recent PCI of ramus intermedius July 2019, thoracic aortic aneurysm, prior CVA, PVD AF, chronic stage III kidney disease, diabetes mellitus, hypertension, polycythemia admitted for hydration prior to cardiac catheterization.  Last echocardiogram July 2019 showed ejection fraction 50 to 55%.  He has been recently seen for atypical chest pain and then more concerning increasing dyspnea on exertion.  He was seen in the office and outpatient cardiac catheterization was recommended.    Assessment & Plan    1 atypical chest pain with more concerning dyspnea on exertion-patient is for right and left cardiac catheterization today.  As outlined in previous note ACE inhibitor and apixaban have been held prior to procedure.  He also carries diagnosis of dye allergy and has been premedicated.  He has been hydrated prior to procedure and renal function improved this morning.    2 chronic stage III kidney disease-renal function improved this morning.  Limit dye.  No ventriculogram.  Follow  renal function after procedure.  3 coronary artery disease-plan to continue Plavix and statin.  4 paroxysmal atrial fibrillation-patient remains in sinus rhythm today.  Continue carvedilol.  Resume apixaban once all procedures complete.  5 hypertension-blood pressure elevated.  Will resume ACE inhibitor following catheterization once renal function stable.  6 thoracic aortic aneurysm-4.5 cm on recent CT.  He will need follow-up study December 2020.  7 polycythemia  For questions or updates, please contact [place] Please consult [email or web address] for contact info under        Signed, Kirk Ruths, MD  02/02/2018, 9:09 AM

## 2018-02-03 ENCOUNTER — Other Ambulatory Visit: Payer: Self-pay | Admitting: Cardiology

## 2018-02-03 ENCOUNTER — Ambulatory Visit (HOSPITAL_COMMUNITY): Payer: Medicare HMO

## 2018-02-03 ENCOUNTER — Telehealth: Payer: Self-pay | Admitting: Cardiology

## 2018-02-03 DIAGNOSIS — Z9861 Coronary angioplasty status: Secondary | ICD-10-CM | POA: Diagnosis not present

## 2018-02-03 DIAGNOSIS — I9589 Other hypotension: Secondary | ICD-10-CM | POA: Diagnosis not present

## 2018-02-03 DIAGNOSIS — N183 Chronic kidney disease, stage 3 (moderate): Secondary | ICD-10-CM | POA: Diagnosis not present

## 2018-02-03 DIAGNOSIS — I509 Heart failure, unspecified: Secondary | ICD-10-CM | POA: Diagnosis not present

## 2018-02-03 DIAGNOSIS — I2510 Atherosclerotic heart disease of native coronary artery without angina pectoris: Secondary | ICD-10-CM | POA: Diagnosis not present

## 2018-02-03 DIAGNOSIS — R0609 Other forms of dyspnea: Secondary | ICD-10-CM | POA: Diagnosis not present

## 2018-02-03 DIAGNOSIS — I25110 Atherosclerotic heart disease of native coronary artery with unstable angina pectoris: Secondary | ICD-10-CM | POA: Diagnosis not present

## 2018-02-03 DIAGNOSIS — I130 Hypertensive heart and chronic kidney disease with heart failure and stage 1 through stage 4 chronic kidney disease, or unspecified chronic kidney disease: Secondary | ICD-10-CM | POA: Diagnosis not present

## 2018-02-03 DIAGNOSIS — E1122 Type 2 diabetes mellitus with diabetic chronic kidney disease: Secondary | ICD-10-CM | POA: Diagnosis not present

## 2018-02-03 DIAGNOSIS — E785 Hyperlipidemia, unspecified: Secondary | ICD-10-CM | POA: Diagnosis not present

## 2018-02-03 DIAGNOSIS — N179 Acute kidney failure, unspecified: Secondary | ICD-10-CM

## 2018-02-03 DIAGNOSIS — I712 Thoracic aortic aneurysm, without rupture: Secondary | ICD-10-CM | POA: Diagnosis not present

## 2018-02-03 DIAGNOSIS — T82855A Stenosis of coronary artery stent, initial encounter: Secondary | ICD-10-CM | POA: Diagnosis not present

## 2018-02-03 DIAGNOSIS — R0789 Other chest pain: Secondary | ICD-10-CM | POA: Diagnosis not present

## 2018-02-03 LAB — BASIC METABOLIC PANEL
Anion gap: 7 (ref 5–15)
BUN: 31 mg/dL — ABNORMAL HIGH (ref 8–23)
CO2: 25 mmol/L (ref 22–32)
Calcium: 8.3 mg/dL — ABNORMAL LOW (ref 8.9–10.3)
Chloride: 106 mmol/L (ref 98–111)
Creatinine, Ser: 1.9 mg/dL — ABNORMAL HIGH (ref 0.61–1.24)
GFR calc Af Amer: 40 mL/min — ABNORMAL LOW (ref >60)
GFR calc non Af Amer: 35 mL/min — ABNORMAL LOW (ref >60)
Glucose, Bld: 257 mg/dL — ABNORMAL HIGH (ref 70–99)
Potassium: 4.3 mmol/L (ref 3.5–5.1)
Sodium: 138 mmol/L (ref 135–145)

## 2018-02-03 LAB — CBC
HCT: 53.1 % — ABNORMAL HIGH (ref 39.0–52.0)
Hemoglobin: 14.2 g/dL (ref 13.0–17.0)
MCH: 19.6 pg — ABNORMAL LOW (ref 26.0–34.0)
MCHC: 26.7 g/dL — ABNORMAL LOW (ref 30.0–36.0)
MCV: 73.3 fL — ABNORMAL LOW (ref 80.0–100.0)
Platelets: 425 10*3/uL — ABNORMAL HIGH (ref 150–400)
RBC: 7.24 MIL/uL — ABNORMAL HIGH (ref 4.22–5.81)
RDW: 23.6 % — ABNORMAL HIGH (ref 11.5–15.5)
WBC: 20.2 10*3/uL — ABNORMAL HIGH (ref 4.0–10.5)
nRBC: 0.3 % — ABNORMAL HIGH (ref 0.0–0.2)

## 2018-02-03 LAB — GLUCOSE, CAPILLARY: Glucose-Capillary: 221 mg/dL — ABNORMAL HIGH (ref 70–99)

## 2018-02-03 MED ORDER — ASPIRIN EC 81 MG PO TBEC
81.0000 mg | DELAYED_RELEASE_TABLET | Freq: Every day | ORAL | Status: DC
  Administered 2018-02-03: 09:00:00 81 mg via ORAL
  Filled 2018-02-03: qty 1

## 2018-02-03 MED ORDER — ACETAMINOPHEN 325 MG PO TABS: 650.0000 mg | ORAL_TABLET | ORAL | Status: AC | PRN

## 2018-02-03 MED ORDER — ASPIRIN 81 MG PO TBEC: 81.0000 mg | DELAYED_RELEASE_TABLET | Freq: Every day | ORAL | Status: AC

## 2018-02-03 MED ORDER — APIXABAN 5 MG PO TABS
5.0000 mg | ORAL_TABLET | Freq: Two times a day (BID) | ORAL | Status: DC
  Administered 2018-02-03: 09:00:00 5 mg via ORAL
  Filled 2018-02-03: qty 1

## 2018-02-03 MED ORDER — ALLOPURINOL 100 MG PO TABS: 100.0000 mg | ORAL_TABLET | Freq: Every day | ORAL | Status: DC

## 2018-02-03 NOTE — Progress Notes (Signed)
CARDIAC REHAB PHASE I   PRE:  Rate/Rhythm: 73 SR  BP:  Supine:   Sitting: 141/70  Standing:    SaO2:   MODE:  Ambulation: 600 ft   POST:  Rate/Rhythm: 102 ST PVCs  BP:  Supine: 155/82  Sitting:   Standing:    SaO2:  0845-0920 Pt walked 600 ft on RA with steady gait and tolerated well.  No CP. Brief ed completed as I saw pt on last admission. He would still like to attend CRP 2. Referred again to [facility] program. Reviewed NTG use, plavix, ex ed and gave diabetic and heart healthy diets.    Graylon Good, RN BSN  02/03/2018 9:15 AM

## 2018-02-03 NOTE — Discharge Summary (Signed)
Discharge Summary    Patient ID: GOLDIE DIMMER MRN: [id number]; DOB: [birth year]  Admit date: 02/01/2018 Discharge date: 02/03/2018  Primary Care Provider: [name], [place]  Primary Cardiologist: Kirk Ruths, MD  Primary Electrophysiologist:  None   Discharge Diagnoses    Principal Problem:   Exertional dyspnea Active Problems:   DM2 (diabetes mellitus, type 2) ([place])   CAD S/P percutaneous coronary angioplasty   Hyperlipidemia   CKD (chronic kidney disease) stage 3, GFR 30-59 ml/min (HCC)   Ascending aortic aneurysm (HCC)   History of allergy to radiographic contrast media   Dyspnea on exertion  CAD-s/p PCI of OM; plan to treat with aspirin 81 mg daily and Plavix 75 mg daily for 1 month.  We will then discontinue aspirin and continue Plavix given need for apixaban.  Continue statin.  2 chronic stage III kidney disease-renal function slightly worse today compared to previous.  We will plan to recheck renal function in the office tomorrow.  3 coronary artery disease-plan to continue aspirin, Plavix and statin as outlined above.  4 paroxysmal atrial fibrillation-patient remains in sinus rhythm today.  Continue carvedilol.  Resume apixaban.  5 hypertension-blood pressure apparently was low in the catheterization laboratory yesterday.  Hydralazine has been discontinued.  We will continue carvedilol, amlodipine and isosorbide.  Follow blood pressure and adjust regimen as needed.  6 thoracic aortic aneurysm-4.5 cm on recent CT. He will need follow-up study December 2020.  7 polycythemia-noted white blood cell count elevated today but he received steroids yesterday.  Plan discharge today.  Check BMET in office in AM. TOC appt with APP one week; fu with me 3 months >30 min PA and physician time D2 Allergies Allergies  Allergen Reactions  . Betadine [Povidone Iodine] Other (See Comments)    Patient doesn't remember  . Contrast Media [Iodinated Diagnostic Agents]  Other (See Comments)    Patient doesn't remember  . Fish Allergy Swelling    Diagnostic Studies/Procedures    Cardiac cath 02/03/18   Ost Ramus to Ramus lesion is 85% stenosed.  Scoring balloon angioplasty was performed using a BALLOON WOLVERINE 2.50X10. -Post dilation angioplasty was performed using a 3.0 mm [place] balloon - Post intervention, there is a 0% residual stenosis.  Ost Ramus lesion is 50% stenosed. Appears to be more significant because of step up to the stent.  Prox LAD lesion is 40% stenosed with 65% stenosed side branch in Ost 1st Diag.  Prox Cx to Mid Cx lesion is 40% stenosed.  Prox RCA to Mid RCA lesion is 40% stenosed. Dist RCA lesion is 40% stenosed.  Right heart cath pressures are normal. LV end diastolic pressure is low.   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  Normal/low LVEDP  Generalized normal Right Heart Pressures with normal cardiac output  RECOMMENDATIONS  Overnight observation post PCI  Hypotensive in the Cath Lab, will DC hydralazine and monitor pressures closely.  May need to reduce amlodipine dose  Expected discharge tomorrow  Continue aggressive risk factor modification  _____________   History of Present Illness     72 year old male with hx of CAD (s/p DES to LAD 2014, 08/2017 DES to RI), 4.5cm thoracic aortic aneurysm by CT 01/2018, CVA 2015 with PAF documented on ILR, bifascicular block (RBBB/LAFB), IDDM, CKD stage III,  HTN, prostate CA, PVCs, MAT 2015, PFO, polycythemia had been having DOE despite PCI in July of 2019 with stent to Ramus.   He  has also been seen in ER several times for chest pain.   After stent in July he was confused about meds and he did not take plavix for 3 weeks.   With continued symptoms it was decided cardiac cath would be best option to give most info on continued symptoms.  Previous myoview have been incorrect, ie neg but pt did have  symptomatic CAD.    A CTA of chest with 4.5cm ascending thoracic aortic aneurysm, aortic athero, mildly prominent mediastinal lymph nodes, stable since prior study, likely reactive.  Elective cardiac cath was planned and pt presented 02/01/18 for procedure which he underwent with no complications but did have new stent placed for in stent restenosis of stent from July and admitted overnight.   Hospital Course     Consultants: none   Pt was seen by Dr. Stanford Breed today and was found stable for discharge.   He will need ASA 81, plavix and elquis BID for 1 month then stop ASA.  He was in SR at discharge.  His Cr was slightly increased today so will recheck in in office tomorrow.  For hypotension with cath his hydralazine was stopped. thoracic aortic aneurysm-4.5 cm on recent CT. He will need follow-up study December 2020.  Polycythemia-noted white blood cell count elevated today but he received steroids yesterday     Pt ambulated with cardiac rehab and did well.  Phase 2 rehab was recommended.   _____________  Discharge Vitals Blood pressure (!) 155/82, pulse 72, temperature 98.1 F (36.7 C), temperature source Oral, resp. rate 17, height '5\' 9"'$  (1.753 m), weight 111 kg, SpO2 100 %.  Filed Weights   02/01/18 1527 02/02/18 0517 02/03/18 0614  Weight: 111.2 kg 110.6 kg 111 kg    Labs & Radiologic Studies    CBC Recent Labs    02/01/18 1749 02/02/18 0458 02/03/18 0406  WBC 9.9 10.8* 20.2*  NEUTROABS 7.1  --   --   HGB 14.4 15.5 14.2  HCT 53.1* 59.1* 53.1*  MCV 73.5* 73.9* 73.3*  PLT 380 438* 540*   Basic Metabolic Panel Recent Labs    02/02/18 0458 02/03/18 0406  NA 141 138  K 4.3 4.3  CL 108 106  CO2 24 25  GLUCOSE 173* 257*  BUN 21 31*  CREATININE 1.48* 1.90*  CALCIUM 8.6* 8.3*   Liver Function Tests Recent Labs    02/01/18 1749  AST 21  ALT 23  ALKPHOS 69  BILITOT 1.0  PROT 6.5  ALBUMIN 2.9*   No results for input(s): LIPASE, AMYLASE in the last 72  hours. Cardiac Enzymes No results for input(s): CKTOTAL, CKMB, CKMBINDEX, TROPONINI in the last 72 hours. BNP Invalid input(s): POCBNP D-Dimer No results for input(s): DDIMER in the last 72 hours. Hemoglobin A1C No results for input(s): HGBA1C in the last 72 hours. Fasting Lipid Panel No results for input(s): CHOL, HDL, LDLCALC, TRIG, CHOLHDL, LDLDIRECT in the last 72 hours. Thyroid Function Tests No results for input(s): TSH, T4TOTAL, T3FREE, THYROIDAB in the last 72 hours.  Invalid input(s): FREET3 _____________  Ct Chest Wo Contrast  Result Date: 01/28/2018 CLINICAL DATA:  Shortness of breath. EXAM: CT CHEST WITHOUT CONTRAST TECHNIQUE: Multidetector CT imaging of the chest was performed following the standard protocol without IV contrast. COMPARISON:  Chest x-ray 01/27/2018.  Chest CT 08/24/2017. FINDINGS: Cardiovascular: Aortic atherosclerosis. Diffuse coronary artery calcifications. Aneurysmal dilatation of the ascending thoracic aorta measures 4.5 cm, stable since prior study. Mild cardiomegaly. Mediastinum/Nodes: Prominent mediastinal lymph nodes, stable  since prior study. No axillary adenopathy. Lungs/Pleura: Bibasilar atelectasis. No effusions. No confluent opacities. Upper Abdomen: Moderate-sized hiatal hernia. Imaging into the upper abdomen shows no acute findings. Musculoskeletal: Chest wall soft tissues are unremarkable. No acute bony abnormality. IMPRESSION: Diffuse coronary artery disease.  Cardiomegaly. Mildly prominent mediastinal lymph nodes, stable since prior study, likely reactive. Moderate-sized hiatal hernia. Bibasilar atelectasis. 4.5 cm ascending thoracic aortic aneurysm. Recommend semi-annual imaging followup by CTA or MRA and referral to cardiothoracic surgery if not already obtained. This recommendation follows 2010 ACCF/AHA/AATS/ACR/ASA/SCA/SCAI/SIR/STS/SVM Guidelines for the Diagnosis and Management of Patients With Thoracic Aortic Disease. Circulation. 2010; 121:  U440-H474 Aortic Atherosclerosis (ICD10-I70.0). Electronically Signed   By: Rolm Baptise M.D.   On: 01/28/2018 19:35   Dg Chest Port 1 View  Result Date: 01/27/2018 CLINICAL DATA:  Shortness of breath. EXAM: PORTABLE CHEST 1 VIEW COMPARISON:  January 04, 2018 FINDINGS: Mild increased hazy opacity in left mid lower lung, asymmetric to the right. No pneumothorax. No nodule or mass. Mild cardiomegaly is stable. No other acute abnormalities. IMPRESSION: Mild hazy opacity in the left mid lower lung may represent asymmetric edema versus subtle infiltrate. Recommend clinical correlation and follow-up to resolution. Electronically Signed   By: Dorise Bullion III M.D   On: 01/27/2018 19:19   Vas [place] Lower Extremity Venous (dvt) (only [place])  Result Date: 01/28/2018  Lower Venous Study Indications: Edema.  Performing Technologist: Abram Sander RVS  Examination Guidelines: A complete evaluation includes B-mode imaging, spectral Doppler, color Doppler, and power Doppler as needed of all accessible portions of each vessel. Bilateral testing is considered an integral part of a complete examination. Limited examinations for reoccurring indications may be performed as noted.  Right Venous Findings: +---+---------------+---------+-----------+----------+-------+    CompressibilityPhasicitySpontaneityPropertiesSummary +---+---------------+---------+-----------+----------+-------+ CFVFull           Yes      Yes                          +---+---------------+---------+-----------+----------+-------+  Left Venous Findings: +---------+---------------+---------+-----------+----------+-------+          CompressibilityPhasicitySpontaneityPropertiesSummary +---------+---------------+---------+-----------+----------+-------+ CFV      Full           Yes      Yes                          +---------+---------------+---------+-----------+----------+-------+ SFJ      Full                                                  +---------+---------------+---------+-----------+----------+-------+ FV Prox  Full                                                 +---------+---------------+---------+-----------+----------+-------+ FV Mid   Full                                                 +---------+---------------+---------+-----------+----------+-------+ FV DistalFull                                                 +---------+---------------+---------+-----------+----------+-------+  PFV      Full                                                 +---------+---------------+---------+-----------+----------+-------+ POP      Full           Yes      Yes                          +---------+---------------+---------+-----------+----------+-------+ PTV      Full                                                 +---------+---------------+---------+-----------+----------+-------+ PERO     Full                                                 +---------+---------------+---------+-----------+----------+-------+    Summary: Right: No evidence of common femoral vein obstruction. Left: There is no evidence of deep vein thrombosis in the lower extremity. No cystic structure found in the popliteal fossa.  *See table(s) above for measurements and observations. Electronically signed by Monica Martinez MD on 01/28/2018 at 3:50:54 PM.    Final    Disposition   Pt is being discharged home today in good condition.  Follow-up Plans & Appointments   Call [facility] at [phone] if any bleeding, swelling or drainage at cath site.  May shower, no tub baths for 48 hours for groin sticks. No lifting over 5 pounds for 3 days.  No Driving for 3 days.   Have lab work done 02/04/18 at Dr. Jacalyn Lefevre office in the morning.    Continue plavix, asprin and eliquis for 1 month then we will stop asprin.   Keep appt with Dr. Stanford Breed in Feb 2020 but do see his NP next week.  Heart  healthy diabetic diet.        Follow-up Information    Lelon Perla, MD Follow up on 02/10/2018.   Specialty:  Cardiology Why:  at 10:00 AM with his NP, Dr. Jory Sims. Contact information: [address] [place] [address] [phone]        [facility] Follow up on 02/04/2018.   Specialty:  Cardiology Why:  go tomorrow morning 02/04/18 for labs to check you kidney function and potassium.  you do not need to hold food.  Contact information: [address] [place] [phone]         Discharge Instructions    Amb Referral to Cardiac Rehabilitation   Complete by:  As directed    Diagnosis:  PTCA      Discharge Medications   Allergies as of 02/03/2018      Reactions   Betadine [povidone Iodine] Other (See Comments)   Patient doesn't remember   Contrast Media [iodinated Diagnostic Agents] Other (See Comments)   Patient doesn't remember   Fish Allergy Swelling      Medication List    STOP taking these medications   sulfamethoxazole-trimethoprim 800-160 MG tablet Commonly known as:  BACTRIM  DS,SEPTRA DS     TAKE these medications   acetaminophen 325 MG tablet Commonly known as:  TYLENOL Take 2 tablets (650 mg total) by mouth every 4 (four) hours as needed for headache or mild pain.   albuterol 108 (90 Base) MCG/ACT inhaler Commonly known as:  PROVENTIL HFA;VENTOLIN HFA Inhale 2 puffs into the lungs every 6 (six) hours as needed for wheezing or shortness of breath.   allopurinol 100 MG tablet Commonly known as:  ZYLOPRIM Take 1 tablet (100 mg total) by mouth daily.   amLODipine 10 MG tablet Commonly known as:  NORVASC Take 1 tablet (10 mg total) by mouth daily.   apixaban 5 MG Tabs tablet Commonly known as:  ELIQUIS Take 1 tablet (5 mg total) by mouth 2 (two) times daily.   aspirin 81 MG EC tablet Take 1 tablet (81 mg total) by mouth daily. Start taking on:   02/04/2018   atorvastatin 80 MG tablet Commonly known as:  LIPITOR Take 1 tablet (80 mg total) by mouth daily at 6 PM.   carvedilol 25 MG tablet Commonly known as:  COREG Take 1 tablet (25 mg total) by mouth 2 (two) times daily.   clopidogrel 75 MG tablet Commonly known as:  PLAVIX Take 1 tablet (75 mg total) by mouth daily with breakfast.   glucose blood test strip Check BS bid DX: E11.9   hydrALAZINE 25 MG tablet Commonly known as:  APRESOLINE Take 1 tablet (25 mg total) by mouth 2 (two) times daily.   Insulin Glargine 100 UNIT/ML Solostar Pen Commonly known as:  LANTUS Inject 45 Units into the skin daily.   Insulin Syringe-Needle U-100 30G X 5/16" 0.5 ML Misc As needed for insulin   INVOKANA 100 MG Tabs tablet Generic drug:  canagliflozin Take 100 mg by mouth daily before breakfast.   isosorbide mononitrate 60 MG 24 hr tablet Commonly known as:  IMDUR Take 1 tablet (60 mg total) by mouth daily.   JANUVIA 100 MG tablet Generic drug:  sitaGLIPtin TAKE 1 TABLET(100 MG) BY MOUTH DAILY What changed:  See the new instructions.   nitroGLYCERIN 0.4 MG SL tablet Commonly known as:  NITROSTAT Place 1 tablet (0.4 mg total) under the tongue every 5 (five) minutes as needed for chest pain. Do not take more than 3 nitroglycerine   ONETOUCH VERIO w/Device Kit 1 Device by Does not apply route 2 (two) times daily. E11.9   Vitamin D (Ergocalciferol) 1.25 MG (50000 UT) Caps capsule Commonly known as:  DRISDOL Take 50,000 Units by mouth every 7 (seven) days.        Acute coronary syndrome (MI, NSTEMI, STEMI, etc) this admission?: No.    Outstanding Labs/Studies   BMP 02/04/18  Duration of Discharge Encounter   Greater than 30 minutes including physician time.  Signed, Cecilie Kicks, NP 02/03/2018, 10:35 AM

## 2018-02-03 NOTE — Telephone Encounter (Signed)
New message   TOC appt scheduled per Cecilie Kicks for 02/10/18 at 10:00am with Jory Sims.

## 2018-02-03 NOTE — Discharge Instructions (Addendum)
Call [facility] at [phone] if any bleeding, swelling or drainage at cath site.  May shower, no tub baths for 48 hours for groin sticks. No lifting over 5 pounds for 3 days.  No Driving for 3 days.   Have lab work done 02/04/18 at Dr. Jacalyn Lefevre office in the morning.    Continue plavix, asprin and eliquis for 1 month then we will stop asprin.   Keep appt with Dr. Stanford Breed in Feb 2020 but do see his NP next week.  Heart healthy diabetic diet.         Thoracic Aortic Aneurysms Mainstays of therapy for aneurysms include good blood pressure control, healthy lifestyle, and avoiding fluoroquinolone antibiotic medications (such as those in the "Cipro" class, ending in "floxacin") due to risk of damage to the aorta. This is a finding I would expect to be monitored periodically by your primary cardiologist. Since aneurysms can run in families, you should discuss your diagnosis with first degree relatives as they may need to be screened for this. Regular mild-moderate physical exercise is OK but avoid heavy lifting/weight lifting over 30lbs, chopping wood, shoveling snow or digging heavy earth with a shovel.   Information on my medicine - ELIQUIS (apixaban)  This medication education was reviewed with me or my healthcare representative as part of my discharge preparation.  The pharmacist that spoke with me during my hospital stay was:  Einar Grad, [facility]  Why was Eliquis prescribed for you? Eliquis was prescribed for you to reduce the risk of a blood clot forming that can cause a stroke if you have a medical condition called atrial fibrillation (a type of irregular heartbeat).  What do You need to know about Eliquis ? Take your Eliquis TWICE DAILY - one tablet in the morning and one tablet in the evening with or without food. If you have difficulty swallowing the tablet whole please discuss with your pharmacist how to take the medication safely.  Take Eliquis  exactly as prescribed by your doctor and DO NOT stop taking Eliquis without talking to the doctor who prescribed the medication.  Stopping may increase your risk of developing a stroke.  Refill your prescription before you run out.  After discharge, you should have regular check-up appointments with your healthcare provider that is prescribing your Eliquis.  In the future your dose may need to be changed if your kidney function or weight changes by a significant amount or as you get older.  What do you do if you miss a dose? If you miss a dose, take it as soon as you remember on the same day and resume taking twice daily.  Do not take more than one dose of ELIQUIS at the same time to make up a missed dose.  Important Safety Information A possible side effect of Eliquis is bleeding. You should call your healthcare provider right away if you experience any of the following: ? Bleeding from an injury or your nose that does not stop. ? Unusual colored urine (red or dark brown) or unusual colored stools (red or black). ? Unusual bruising for unknown reasons. ? A serious fall or if you hit your head (even if there is no bleeding).  Some medicines may interact with Eliquis and might increase your risk of bleeding or clotting while on Eliquis. To help avoid this, consult your healthcare provider or pharmacist prior to using any new prescription or non-prescription medications, including herbals, vitamins, non-steroidal anti-inflammatory drugs (NSAIDs) and supplements.  This website has more information on Eliquis (apixaban): http://www.eliquis.com/eliquis/home

## 2018-02-03 NOTE — Care Management Obs Status (Signed)
[place] NOTIFICATION   Patient Details  Name: SHASHANK KWASNIK MRN: [id number] Date of Birth: 08-Mar-1945   Medicare Observation Status Notification Given:       Zenon Mayo, RN 02/03/2018, 9:39 AM

## 2018-02-03 NOTE — Care Management Note (Signed)
Case Management Note  Patient Details  Name: James Mcclain MRN: [id number] Date of Birth: [birth year]  Subjective/Objective:  From home alone, s/p coronary balloon angioplasty, will be on plavix and eliquis , which he was on pta.  He wanted diet information, NCM informed RN to get nutrition to bring patient information on his diet to help him better prepare his meals.                  Action/Plan: DC home when ready.   Expected Discharge Date:                  Expected Discharge Plan:  Home/Self Care  In-House Referral:     Discharge planning Services  CM Consult  Post Acute Care Choice:    Choice offered to:     DME Arranged:    DME Agency:     HH Arranged:    [place] Agency:     Status of Service:  Completed, signed off  If discussed at [employer] of Stay Meetings, dates discussed:    Additional Comments:  Zenon Mayo, RN 02/03/2018, 9:54 AM

## 2018-02-03 NOTE — Progress Notes (Signed)
Progress Note  Patient Name: James Mcclain Date of Encounter: 02/03/2018  Primary Cardiologist: Kirk Ruths, MD   Subjective   Pt without dyspnea or CP  Inpatient Medications    Scheduled Meds: . allopurinol  100 mg Oral Daily  . amLODipine  10 mg Oral Daily  . atorvastatin  80 mg Oral q1800  . carvedilol  25 mg Oral BID  . clopidogrel  75 mg Oral Q breakfast  . insulin aspart  0-9 Units Subcutaneous TID WC  . insulin glargine  45 Units Subcutaneous QHS  . isosorbide mononitrate  60 mg Oral Daily  . mupirocin ointment  1 application Nasal BID  . sodium chloride flush  3 mL Intravenous Q12H  . sodium chloride flush  3 mL Intravenous Q12H   Continuous Infusions: . sodium chloride    . sodium chloride     PRN Meds: sodium chloride, sodium chloride, acetaminophen, albuterol, morphine injection, nitroGLYCERIN, ondansetron (ZOFRAN) IV, sodium chloride flush, sodium chloride flush   Vital Signs    Vitals:   02/02/18 1635 02/02/18 1735 02/02/18 2050 02/03/18 0614  BP: 139/81 129/75 (!) 154/78 116/72  Pulse: 80 81 80 72  Resp: 15 14 17 17   Temp:   98 F (36.7 C) 98.1 F (36.7 C)  TempSrc:   Oral Oral  SpO2: 99% 99% 99% 100%  Weight:    111 kg  Height:        Intake/Output Summary (Last 24 hours) at 02/03/2018 0723 Last data filed at 02/02/2018 2052 Gross per 24 hour  Intake 1021.67 ml  Output 450 ml  Net 571.67 ml   Filed Weights   02/01/18 1527 02/02/18 0517 02/03/18 0614  Weight: 111.2 kg 110.6 kg 111 kg    Telemetry    Sinus with PVC- Personally Reviewed  Physical Exam   GEN: No acute distress.  WD WN Neck: No JVD, supple Cardiac: RRR Respiratory: CTA GI: Soft, NT/ND MS: No edema; radial cath site with no hematoma Neuro:  Grossly intact   Labs    Chemistry Recent Labs  Lab 02/01/18 1749 02/02/18 0458 02/03/18 0406  NA 142 141 138  K 3.9 4.3 4.3  CL 109 108 106  CO2 24 24 25   GLUCOSE 96 173* 257*  BUN 20 21 31*  CREATININE 1.48*  1.48* 1.90*  CALCIUM 8.5* 8.6* 8.3*  PROT 6.5  --   --   ALBUMIN 2.9*  --   --   AST 21  --   --   ALT 23  --   --   ALKPHOS 69  --   --   BILITOT 1.0  --   --   GFRNONAA 47* 47* 35*  GFRAA 54* 54* 40*  ANIONGAP 9 9 7      Hematology Recent Labs  Lab 02/01/18 1749 02/02/18 0458 02/03/18 0406  WBC 9.9 10.8* 20.2*  RBC 7.22* 8.00* 7.24*  HGB 14.4 15.5 14.2  HCT 53.1* 59.1* 53.1*  MCV 73.5* 73.9* 73.3*  MCH 19.9* 19.4* 19.6*  MCHC 27.1* 26.2* 26.7*  RDW 23.8* 23.9* 23.6*  PLT 380 438* 425*    Cardiac Enzymes Recent Labs  Lab 01/27/18 2358 01/28/18 0512 01/28/18 1323 01/29/18 0243  TROPONINI <0.03 0.04* <0.03 <0.03    BNP Recent Labs  Lab 01/27/18 1845  BNP 322.6*     Patient Profile     72 year old male with past medical history of coronary artery disease status post prior PCI of LAD and more recent PCI of  ramus intermedius July 2019, thoracic aortic aneurysm, prior CVA, PVD AF, chronic stage III kidney disease, diabetes mellitus, hypertension, polycythemia admitted for hydration prior to cardiac catheterization.  Last echocardiogram July 2019 showed ejection fraction 50 to 55%.  He has been recently seen for atypical chest pain and then more concerning increasing dyspnea on exertion.    Now status post PCI of obtuse marginal.  Assessment & Plan    1 CAD-s/p PCI of OM; plan to treat with aspirin 81 mg daily and Plavix 75 mg daily for 1 month.  We will then discontinue aspirin and continue Plavix given need for apixaban.  Continue statin.  2 chronic stage III kidney disease-renal function slightly worse today compared to previous.  We will plan to recheck renal function in the office tomorrow.  3 coronary artery disease-plan to continue aspirin, Plavix and statin as outlined above.  4 paroxysmal atrial fibrillation-patient remains in sinus rhythm today.  Continue carvedilol.  Resume apixaban.  5 hypertension-blood pressure apparently was low in the  catheterization laboratory yesterday.  Hydralazine has been discontinued.  We will continue carvedilol, amlodipine and isosorbide.  Follow blood pressure and adjust regimen as needed.  6 thoracic aortic aneurysm-4.5 cm on recent CT.  He will need follow-up study December 2020.  7 polycythemia-noted white blood cell count elevated today but he received steroids yesterday.  Plan discharge today.  Check BMET in office in AM. TOC appt with APP one week; fu with me 3 months >30 min PA and physician time D2  For questions or updates, please contact [facility] Please consult [email or web address] for contact info under     Signed, Kirk Ruths, MD  02/03/2018, 7:23 AM

## 2018-02-03 NOTE — Progress Notes (Signed)
Pt given discharge instructions with understanding. Pt has no questions at this time. Pt states he does not need prescription for medication he has some at home. Monitor and iv d/c. Awaiting ride.

## 2018-02-03 NOTE — Progress Notes (Signed)
Carelink Summary Report / Loop Recorder 

## 2018-02-04 ENCOUNTER — Telehealth: Payer: Self-pay | Admitting: *Deleted

## 2018-02-04 ENCOUNTER — Telehealth (HOSPITAL_COMMUNITY): Payer: Self-pay

## 2018-02-04 DIAGNOSIS — N179 Acute kidney failure, unspecified: Secondary | ICD-10-CM | POA: Diagnosis not present

## 2018-02-04 LAB — BASIC METABOLIC PANEL
BUN/Creatinine Ratio: 23 (ref 10–24)
BUN: 45 mg/dL — ABNORMAL HIGH (ref 8–27)
CO2: 22 mmol/L (ref 20–29)
Calcium: 9 mg/dL (ref 8.6–10.2)
Chloride: 106 mmol/L (ref 96–106)
Creatinine, Ser: 1.93 mg/dL — ABNORMAL HIGH (ref 0.76–1.27)
GFR calc Af Amer: 39 mL/min/{1.73_m2} — ABNORMAL LOW (ref >59)
GFR calc non Af Amer: 34 mL/min/{1.73_m2} — ABNORMAL LOW (ref >59)
Glucose: 72 mg/dL (ref 65–99)
Potassium: 5.1 mmol/L (ref 3.5–5.2)
Sodium: 143 mmol/L (ref 134–144)

## 2018-02-04 NOTE — Telephone Encounter (Signed)
Left message for pt to call.

## 2018-02-04 NOTE — Telephone Encounter (Signed)
-----   Message from Isaiah Serge, NP sent at 02/03/2018  4:41 PM EST ----- Can you ask pt to stop his hydralazine, Dr. Stanford Breed decided to stop due to his the pt's low BP in the hospital.  Keep a log of BP if he can.  He has appt next week. Thanks.

## 2018-02-04 NOTE — Telephone Encounter (Signed)
Called patient to see if he is interested in the Cardiac Rehab Program. Patient expressed interest. Adv pt he will need to complete his f/u appt on 02/10/18

## 2018-02-04 NOTE — Telephone Encounter (Signed)
Pt insurance is active and benefits verified through [facility]. Co-pay $10.00, DED $0.00/$0.00 met, out of pocket $3,400.00/$1,325.00 met, co-insurance 0%. No pre-authorization required. Passport, 02/04/18 @ 9:15AM, REF# [id number]  Patient will need to complete follow up appt. Once completed, patient will be contacted for scheduling upon review by the RN Navigator.

## 2018-02-04 NOTE — Telephone Encounter (Signed)
Duplicate referral, #[id number]

## 2018-02-04 NOTE — Telephone Encounter (Signed)
Called pt re: message below. Spoke with pt and he has been advised to stop the Hydralazine for now, since his bp was low in the hospital and to keep a log of his bp readings and bring with him to his appt next week. Pt verbalized understanding and thanked me for the call.

## 2018-02-05 ENCOUNTER — Ambulatory Visit (HOSPITAL_COMMUNITY): Payer: Medicare HMO

## 2018-02-05 DIAGNOSIS — L0390 Cellulitis, unspecified: Secondary | ICD-10-CM | POA: Diagnosis not present

## 2018-02-05 DIAGNOSIS — H938X2 Other specified disorders of left ear: Secondary | ICD-10-CM | POA: Diagnosis not present

## 2018-02-05 DIAGNOSIS — H9202 Otalgia, left ear: Secondary | ICD-10-CM | POA: Diagnosis not present

## 2018-02-05 NOTE — Telephone Encounter (Signed)
Called patient, LVM advising of upcoming appointment. Left call back number.

## 2018-02-08 ENCOUNTER — Ambulatory Visit (HOSPITAL_COMMUNITY): Payer: Medicare HMO

## 2018-02-09 NOTE — Progress Notes (Signed)
Cardiology Office Note   Date:  02/10/2018   ID:  KHYAN OATS, DOB 08-13-1945, MRN [id number]  PCP:  James Paddy, FNP  Cardiologist:  Dr. Stanford Breed No chief complaint on file.    History of Present Illness: James Mcclain is a 72 y.o. male who presents for ongoing assessment and management of CAD,  (s/p DES to LAD 2014, 08/2017 DES to RI), hx of PCI of the OM, treated with ASA and Plavix 75 mg for one month, then discontinue ASA given need for apixaban, in the setting of PAF.   He had a RHC and LHC on 02/02/2018. 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 Of the recently placed Ramus stent, otherwise stable coronaries. Normal right heart pressures. He is continued on DAPT.   He denies chest pain, palpitations, bleeding or excessive bruising. He states he feels good and is remaining active.  He continues to see PCP for Type II diabetes management.   Past Medical History:  Diagnosis Date  . Arthritis    "right hand" (09/21/2017)  . CHF (congestive heart failure) ([place])   . CKD (chronic kidney disease), stage III ([place])   . Contrast media allergy   . Coronary artery disease    a. s/p Xience DES to the LAD 07/2012. b. 09/21/17 Synergy stent to RI.   . Dilated aortic root (James Mcclain)    a. Mildly dilated by echo 09/2013, f/u MRA scheduled for 12/2014.  [name] Dyspnea   . Elevated PSA   . Essential hypertension   . Gout    "on daily RX" (09/21/2017)  . Heart murmur   . History of kidney stones   . Hyperlipidemia   . Insulin dependent diabetes mellitus (James Mcclain)   . 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 On tele 09/2013.  [name] PAF (paroxysmal atrial fibrillation) (James Mcclain)   . Paroxysmal atrial fibrillation (H[facility])   . PFO (patent foramen ovale)    a. By echo 09/2013.  [name] Polycythemia   . Prostate cancer (James Mcclain) 07/15/12   Gleason 7, external beam radiation Tx, Dr. Pilar Jarvis  . PVC's (premature ventricular contractions)   . 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 Stroke  [facility])    a. Cryptogenic, 09/2013. Small PFO noted on echo but neg LE duplex. PAF eventually seen on monitor.  . Thoracic aortic aneurysm ([place])    a. 4.5cm by CT 01/2018.    Past Surgical History:  Procedure Laterality Date  . COLONOSCOPY WITH PROPOFOL N/A 02/22/2016   Procedure: COLONOSCOPY WITH PROPOFOL;  Surgeon: Carol Ada, MD;  Location: [facility];  Service: Endoscopy;  Laterality: N/A;  . CORONARY ANGIOPLASTY WITH STENT PLACEMENT  08/05/2012   LAD, 1 stent  . CORONARY BALLOON ANGIOPLASTY N/A 02/02/2018   Procedure: CORONARY BALLOON ANGIOPLASTY;  Surgeon: Leonie Man, MD;  Location: [place] CV LAB;  Service: Cardiovascular;  Laterality: N/A;  . CORONARY STENT INTERVENTION N/A 09/21/2017   Procedure: CORONARY STENT INTERVENTION;  Surgeon: Belva Crome, MD;  Location: [place] CV LAB;  Service: Cardiovascular;  Laterality: N/A;  . EP IMPLANTABLE DEVICE N/A 08/23/2014   Procedure: Loop Recorder Insertion;  Surgeon: Evans Lance, MD;  Location: [place] CV LAB;  Service: Cardiovascular;  Laterality: N/A;  . INSERTION PROSTATE RADIATION SEED    . JOINT REPLACEMENT    . LEFT HEART CATH AND CORONARY ANGIOGRAPHY N/A 09/21/2017   Procedure: LEFT HEART CATH AND CORONARY ANGIOGRAPHY;  Surgeon: Belva Crome, MD;  Location: [place] CV LAB;  Service: Cardiovascular;  Laterality: N/A;  . LITHOTRIPSY  2006  . PERCUTANEOUS CORONARY STENT INTERVENTION (PCI-S) N/A 08/05/2012   Procedure: PERCUTANEOUS CORONARY STENT INTERVENTION (PCI-S);  Surgeon: Sherren Mocha, MD;  Location: [facility] CATH LAB;  Service: Cardiovascular;  Laterality: N/A;  . PROSTATE BIOPSY  07/15/2012; 09/23/2013  . RIGHT/LEFT HEART CATH AND CORONARY ANGIOGRAPHY N/A 02/02/2018   Procedure: RIGHT/LEFT HEART CATH AND CORONARY ANGIOGRAPHY;  Surgeon: Leonie Man, MD;  Location: [place] CV LAB;  Service: Cardiovascular;  Laterality: N/A;  . TEE WITHOUT CARDIOVERSION N/A 09/27/2013   Procedure: TRANSESOPHAGEAL  ECHOCARDIOGRAM (TEE);  Surgeon: Larey Dresser, MD;  Location: [place];  Service: Cardiovascular;  Laterality: N/A;  . TONSILLECTOMY  1990s  . TOTAL KNEE ARTHROPLASTY  01/24/2011   Procedure: TOTAL KNEE ARTHROPLASTY;  Surgeon: Alta Corning;  Location: [place];  Service: Orthopedics;  Laterality: Left;  COMPUTER ASSISTED LEFT  TOTAL KNEE REPLACEMENT. Anesthesia a combination of regional and general.  . ULTRASOUND GUIDANCE FOR VASCULAR ACCESS  02/02/2018   Procedure: Ultrasound Guidance For Vascular Access;  Surgeon: Leonie Man, MD;  Location: [place] CV LAB;  Service: Cardiovascular;;     Current Outpatient Medications  Medication Sig Dispense Refill  . acetaminophen (TYLENOL) 325 MG tablet Take 2 tablets (650 mg total) by mouth every 4 (four) hours as needed for headache or mild pain.    [name] albuterol (PROVENTIL HFA;VENTOLIN HFA) 108 (90 Base) MCG/ACT inhaler Inhale 2 puffs into the lungs every 6 (six) hours as needed for wheezing or shortness of breath. 1 Inhaler 0  . allopurinol (ZYLOPRIM) 100 MG tablet Take 1 tablet (100 mg total) by mouth daily.    [name] amLODipine (NORVASC) 10 MG tablet Take 1 tablet (10 mg total) by mouth daily. 90 tablet 3  . apixaban (ELIQUIS) 5 MG TABS tablet Take 1 tablet (5 mg total) by mouth 2 (two) times daily. 180 tablet 3  . aspirin EC 81 MG EC tablet Take 1 tablet (81 mg total) by mouth daily.    [name] atorvastatin (LIPITOR) 80 MG tablet Take 1 tablet (80 mg total) by mouth daily at 6 PM. 90 tablet 3  . Blood Glucose Monitoring Suppl (ONETOUCH VERIO) w/Device KIT 1 Device by Does not apply route 2 (two) times daily. E11.9 1 kit 2  . canagliflozin (INVOKANA) 100 MG TABS tablet Take 100 mg by mouth daily before breakfast.    . carvedilol (COREG) 25 MG tablet Take 1 tablet (25 mg total) by mouth 2 (two) times daily. 180 tablet 3  . clopidogrel (PLAVIX) 75 MG tablet Take 1 tablet (75 mg total) by mouth daily with breakfast. 34 tablet 5  . glucose blood test  strip Check BS bid DX: E11.9 100 each 12  . Insulin Glargine (LANTUS SOLOSTAR) 100 UNIT/ML Solostar Pen Inject 45 Units into the skin daily. 15 mL 3  . Insulin Syringe-Needle U-100 (SAFETY INSULIN SYRINGES) 30G X 5/16" 0.5 ML MISC As needed for insulin 100 each 11  . isosorbide mononitrate (IMDUR) 60 MG 24 hr tablet Take 1 tablet (60 mg total) by mouth daily. 30 tablet 2  . JANUVIA 100 MG tablet TAKE 1 TABLET(100 MG) BY MOUTH DAILY 90 tablet 3  . nitroGLYCERIN (NITROSTAT) 0.4 MG SL tablet Place 1 tablet (0.4 mg total) under the tongue every 5 (five) minutes as needed for chest pain. Do not take more than 3 nitroglycerine 25 tablet 3  . Vitamin D, Ergocalciferol, (DRISDOL) 1.25 MG (  50000 UT) CAPS capsule Take 50,000 Units by mouth every 7 (seven) days.     No current facility-administered medications for this visit.     Allergies:   Betadine [povidone iodine]; Contrast media [iodinated diagnostic agents]; and Fish allergy    Social History:  The patient  reports that he has never smoked. He has never used smokeless tobacco. He reports previous alcohol use. He reports that he does not use drugs.   Family History:  The patient's family history includes Cancer in his mother; Cervical cancer in his sister; Diabetes in his sister; Heart Problems in his brother, father, and unknown relative; Hypertension in his brother; Leukemia in his mother; Stroke in his brother and unknown relative.    ROS: All other systems are reviewed and negative. Unless otherwise mentioned in H&P    PHYSICAL EXAM: VS:  BP 124/76   Pulse 69   Ht 5' 9" (1.753 m)   Wt 242 lb (109.8 kg)   SpO2 97%   BMI 35.74 kg/m  , BMI Body mass index is 35.74 kg/m. GEN: Well nourished, well developed, in no acute distress HEENT: normal Neck: no JVD, carotid bruits, or masses Cardiac: RRR; no murmurs, rubs, or gallops,no edema  Respiratory:  Clear to auscultation bilaterally, normal work of breathing GI: soft, nontender,  nondistended, + BS MS: no deformity or atrophy. Cath insertion site is well healed.  Skin: warm and dry, no rash Neuro:  Strength and sensation are intact Psych: euthymic mood, full affect   EKG:  Not completed this office visit.   Recent Labs: 01/27/2018: B Natriuretic Peptide 322.6 02/01/2018: ALT 23 02/03/2018: Hemoglobin 14.2; Platelets 425 02/04/2018: BUN 45; Creatinine, Ser 1.93; Potassium 5.1; Sodium 143    Lipid Panel    Component Value Date/Time   CHOL 104 01/13/2018 0841   TRIG 90 01/13/2018 0841   HDL 31 (L) 01/13/2018 0841   CHOLHDL 3.2 11/20/2015 0818   VLDL 16 11/20/2015 0818   LDLCALC 55 01/13/2018 0841      Wt Readings from Last 3 Encounters:  02/10/18 242 lb (109.8 kg)  02/03/18 244 lb 11.4 oz (111 kg)  01/29/18 249 lb 1.9 oz (113 kg)    Other studies Reviewed: Cardiac cath 02/02/2018  Conclusion  Ost Ramus to Ramus lesion is 85% stenosed.  Scoring balloon angioplasty was performed using a BALLOON WOLVERINE 2.50X10. -Post dilation angioplasty was performed using a 3.0 mm Draper balloon - Post intervention, there is a 0% residual stenosis.  Ost Ramus lesion is 50% stenosed. Appears to be more significant because of step up to the stent.  Prox LAD lesion is 40% stenosed with 65% stenosed side branch in Ost 1st Diag.  Prox Cx to Mid Cx lesion is 40% stenosed.  Prox RCA to Mid RCA lesion is 40% stenosed. Dist RCA lesion is 40% stenosed.  Right heart cath pressures are normal. LV end diastolic pressure is low. 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  Normal/low LVEDP  Generalized normal Right Heart Pressures with normal cardiac output RECOMMENDATIONS  Overnight observation post PCI  Hypotensive in the Cath Lab, will DC hydralazine and monitor pressures closely. May need to reduce amlodipine dose  Expected discharge tomorrow Continue aggressive risk factor modification     ASSESSMENT AND  PLAN:  1.  CAD: S/P PCI of the ramus for in-stent restenosis. He continues on DAPT. He is completely asymptomatic. He will continue carvedilol 25 mg BID, amlodipine 10 mg  daily, and statin as BP is tolerating this combination well.   2. Hypertension: As above. He denies dizziness or near syncope. BP is stable. For diabetic patients with CAD, BP recommended < 130/60. He is at goal.   3. Hypercholesterolemia: Continue atorvastatin 80 mg daily. He has a goal for LDL of <70. Will need to recheck status on next office visit.   4. PAF: Continue Eliquis as directed. He denies any excessive bleeding or bruising.   5. Diabetes: He is followed by his PCP for ongoing management.   Current medicines are reviewed at length with the patient today.    Labs/ tests ordered today include: None at this visit.   Phill Myron. West Pugh, ANP, AACC   02/10/2018 10:20 AM    [address] Office [phone] Fax [phone]

## 2018-02-09 NOTE — Telephone Encounter (Signed)
Patient contacted regarding discharge from [facility] on 02/03/18.  Patient understands to follow up with provider Jory Sims, DNP on 02/10/18 at 10 am at [facility]. Patient understands discharge instructions? yes Patient understands medications and regiment? yes Patient understands to bring all medications to this visit? yes

## 2018-02-10 ENCOUNTER — Ambulatory Visit (HOSPITAL_COMMUNITY): Payer: Medicare HMO

## 2018-02-10 ENCOUNTER — Other Ambulatory Visit: Payer: Self-pay

## 2018-02-10 ENCOUNTER — Ambulatory Visit (INDEPENDENT_AMBULATORY_CARE_PROVIDER_SITE_OTHER): Payer: Medicare HMO | Admitting: Adult Health

## 2018-02-10 ENCOUNTER — Encounter: Payer: Self-pay | Admitting: Adult Health

## 2018-02-10 VITALS — BP 124/76 | HR 69 | Ht 69.0 in | Wt 242.0 lb

## 2018-02-10 DIAGNOSIS — I2510 Atherosclerotic heart disease of native coronary artery without angina pectoris: Secondary | ICD-10-CM

## 2018-02-10 DIAGNOSIS — I10 Essential (primary) hypertension: Secondary | ICD-10-CM

## 2018-02-10 DIAGNOSIS — I480 Paroxysmal atrial fibrillation: Secondary | ICD-10-CM

## 2018-02-10 DIAGNOSIS — E7800 Pure hypercholesterolemia, unspecified: Secondary | ICD-10-CM | POA: Diagnosis not present

## 2018-02-10 NOTE — Patient Instructions (Signed)
PLEASE CALL WITH ANY ISSUES Follow-Up: You will need a follow up appointment in 6 months.  Please call our office 2 months in advance (APRIL)   to schedule the [facility])  appointment.  You may see Kirk Ruths, MD Jory Sims, DNP, AACC  or one of the following Advanced Practice Providers on your designated Care Team:  Kerin Ransom, [place]   Roby Lofts, PA-C   Sande Rives, [place]      Medication Instructions:  NO CHANGES- Your physician recommends that you continue on your current medications as directed. Please refer to the Current Medication list given to you today. If you need a refill on your cardiac medications before your next appointment, please call your pharmacy.  Labwork: When you have labs (blood work) and your tests are completely normal, you will receive your results ONLY by [place] (if you have MyChart) -OR- A paper copy in the mail.  At [facility], you and your health needs are our priority.  As part of our continuing mission to provide you with exceptional heart care, we have created designated Provider Care Teams.  These Care Teams include your primary Cardiologist (physician) and Advanced Practice Providers (APPs -  Physician Assistants and Nurse Practitioners) who all work together to provide you with the care you need, when you need it.  Thank you for choosing [facility] at [facility]!!

## 2018-02-10 NOTE — Patient Outreach (Addendum)
[facility]) Care Management  02/10/2018   James Mcclain 1945-04-09 [id number]  TELEPHONE SCREENING//Transition of care Referral date: 01/28/18 Referral source: primary MD  Referral reason: medication management Insurance: [employer]  Initial assessment  Subjective: Telephone call to patient regarding referral. HIPAA verified with patient. Explained reason for call.  Patient states he is unable to afford his diabetes medication, Invokana, Januvia, and lantas. Patient states he is currently out of Januvia and is low on his Invokana and lantas.  Patient states he is in the donut hole.  RNCM advised patient to request samples from primary MD office.   Patient states he has been diabetic for approximately 10 years. He states he is unsure of what his most recent A1 c is. Patient states he is not on a diabetic diet. Patient states his blood sugars range from the 100's to the high 300's. Patient reports he checks his blood sugars daily.  Patient reports he was recently in the hospital and discharge on 02/03/18.  Patient states he was in the hospital due to having a bad heart.  He states he had stents placed. Patient reports he was in the hospital approximately 6 months ago for the same reason. Patient states he has a loop recorder.  Patient states he has a follow appointment with his doctor on tomorrow.  RNCM discussed and offered [facility] care management services. Patient verbally agreed. RNCM informed patient he would be mailed a [facility] care management welcome packet. Advised patient to complete consent form inclosed in welcome packet and return to [facility] care management in the self addressed stamped envelop.  Patient verbalized understanding.  RNCM advised patient to notify MD of any changes in condition prior to scheduled appointment. RNCM verified patient aware of 911 services for urgent/ emergent needs.   Objective: see assessment  Current Medications:  Current Outpatient Medications   Medication Sig Dispense Refill  . acetaminophen (TYLENOL) 325 MG tablet Take 2 tablets (650 mg total) by mouth every 4 (four) hours as needed for headache or mild pain.    [name] albuterol (PROVENTIL HFA;VENTOLIN HFA) 108 (90 Base) MCG/ACT inhaler Inhale 2 puffs into the lungs every 6 (six) hours as needed for wheezing or shortness of breath. 1 Inhaler 0  . allopurinol (ZYLOPRIM) 100 MG tablet Take 1 tablet (100 mg total) by mouth daily.    [name] amLODipine (NORVASC) 10 MG tablet Take 1 tablet (10 mg total) by mouth daily. 90 tablet 3  . apixaban (ELIQUIS) 5 MG TABS tablet Take 1 tablet (5 mg total) by mouth 2 (two) times daily. 180 tablet 3  . aspirin EC 81 MG EC tablet Take 1 tablet (81 mg total) by mouth daily.    [name] atorvastatin (LIPITOR) 80 MG tablet Take 1 tablet (80 mg total) by mouth daily at 6 PM. 90 tablet 3  . Blood Glucose Monitoring Suppl (ONETOUCH VERIO) w/Device KIT 1 Device by Does not apply route 2 (two) times daily. E11.9 1 kit 2  . canagliflozin (INVOKANA) 100 MG TABS tablet Take 100 mg by mouth daily before breakfast.    . carvedilol (COREG) 25 MG tablet Take 1 tablet (25 mg total) by mouth 2 (two) times daily. 180 tablet 3  . clopidogrel (PLAVIX) 75 MG tablet Take 1 tablet (75 mg total) by mouth daily with breakfast. 34 tablet 5  . glucose blood test strip Check BS bid DX: E11.9 100 each 12  . Insulin Glargine (LANTUS SOLOSTAR) 100 UNIT/ML Solostar Pen Inject 45 Units into the  skin daily. 15 mL 3  . Insulin Syringe-Needle U-100 (SAFETY INSULIN SYRINGES) 30G X 5/16" 0.5 ML MISC As needed for insulin 100 each 11  . isosorbide mononitrate (IMDUR) 60 MG 24 hr tablet Take 1 tablet (60 mg total) by mouth daily. 30 tablet 2  . nitroGLYCERIN (NITROSTAT) 0.4 MG SL tablet Place 1 tablet (0.4 mg total) under the tongue every 5 (five) minutes as needed for chest pain. Do not take more than 3 nitroglycerine 25 tablet 3  . Vitamin D, Ergocalciferol, (DRISDOL) 1.25 MG (50000 UT) CAPS capsule Take  50,000 Units by mouth every 7 (seven) days.    [name] JANUVIA 100 MG tablet TAKE 1 TABLET(100 MG) BY MOUTH DAILY (Patient not taking: Reported on 02/10/2018) 90 tablet 3   No current facility-administered medications for this visit.     Functional Status:  In your present state of health, do you have any difficulty performing the following activities: 02/10/2018 02/01/2018  Hearing? Y -  Comment bilateral hearing aid -  Vision? N -  Difficulty concentrating or making decisions? N -  Walking or climbing stairs? N -  Dressing or bathing? N -  Doing errands, shopping? N N  Preparing Food and eating ? N -  Using the Toilet? N -  In the past six months, have you accidently leaked urine? N -  Do you have problems with loss of bowel control? N -  Managing your Medications? N -  Managing your Finances? N -  Housekeeping or managing your Housekeeping? N -  Some recent data might be hidden    Fall/Depression Screening: Fall Risk  02/10/2018 12/30/2016 12/16/2016  Falls in the past year? 0 No No   PHQ 2/9 Scores 02/10/2018 12/30/2016 12/16/2016 12/08/2016 05/27/2016 04/17/2016 06/12/2015  PHQ - 2 Score 0 0 0 0 0 0 0  PHQ- 9 Score - - - - 0 - -    THN CM Care Plan Problem One     Most Recent Value  Care Plan Problem One  knowledge defiicit related to diabetes  Role Documenting the Problem One  Care Management Telephonic Coordinator  Care Plan for Problem One  Active  THN Long Term Goal   Patient will be able to verbalize his A1c within 45 days  THN Long Term Goal Start Date  02/11/18  Interventions for Problem One Long Term Goal  RNCM will confirm patient has follow up appointment scheduled with his primary MD.  [facility] will discuss with patient meaning of A1c  THN CM Short Term Goal #1   Patient will verbalize symptoms of hyper and hypoglycemia within 30 days  THN CM Short Term Goal #1 Start Date  02/11/18  Interventions for Short Term Goal #1  RNCM send patient EMMI education material on diabetic  symptoms.   THN CM Short Term Goal #2   Patient will report he is adhering to a carb modified diet  THN CM Short Term Goal #2 Start Date  02/11/18  Interventions for Short Term Goal #2  RNCM sent EMMI education material regarding a carb modified diet.     Assessment:  Ongoing transition of care follow up/ disease management  Plan:  RNCM will refer patient to [place] for medication assistance.  RNCM will follow up with patient within 1 week  RNCm will send patient THN welcome packet/ and EMMI education material for diabetes RNCm will send involvement letter to patients primary MD.   [name] RN,BSN,CCM [facility] Telephonic  [phone]

## 2018-02-11 ENCOUNTER — Ambulatory Visit: Payer: Medicare HMO | Admitting: Pharmacist

## 2018-02-12 ENCOUNTER — Ambulatory Visit (HOSPITAL_COMMUNITY): Payer: Medicare HMO

## 2018-02-12 ENCOUNTER — Telehealth (HOSPITAL_COMMUNITY): Payer: Self-pay | Admitting: *Deleted

## 2018-02-12 ENCOUNTER — Ambulatory Visit: Payer: Medicare HMO | Admitting: Pharmacist

## 2018-02-12 NOTE — Telephone Encounter (Signed)
-----   Message from Lelon Perla, MD sent at 02/11/2018  5:21 PM EST ----- Regarding: RE: Activity restrictions and BP parameters for AAA No heavy lifting over 20 lbs James Mcclain ----- Message ----- From: Rowe Pavy, RN Sent: 02/11/2018   3:57 PM EST To: Lelon Perla, MD Subject: Activity restrictions and BP parameters for #   Dr. Stanford Breed,  Zella Ball referred to cardiac rehab s/p  02/02/18 -PTCA for ISR Ramus.  Pt previously scheduled earlier this month  for CR s/p DES to Ramus in July and this was put on hold pending cath.  Pt thoracic aortic aneurysm-4.5 cm on recent CT. He will need follow-up study December 2020. completed his follow up appt with Jonette Mate PA.  Noted in his medical history, pt has thoracic aortic aneurysm-4.5 cm on recent CT. He will need follow-up study December 2020.  Any activities of exercise he should avoid? Bp parameters for pre and during exercise?  Thank you for the input  Cherre Huger, BSN Cardiac and Pulmonary Rehab Nurse Navigator

## 2018-02-15 ENCOUNTER — Ambulatory Visit (HOSPITAL_COMMUNITY): Payer: Medicare HMO

## 2018-02-16 ENCOUNTER — Other Ambulatory Visit: Payer: Self-pay

## 2018-02-16 NOTE — Patient Outreach (Signed)
[place] [facility]) Care Management  02/16/2018  James Mcclain [birth year] [id number]  Transition of care Referral date: 01/28/18 Referral source: primary MD  Referral reason: medication management Insurance: [facility]  Telephone call to patient regarding transition of care follow up. HIPAA verified with patient. Patient states he is doing pretty good today. He states he has not received the [facility] care management packet in the mail.  Patient states he has not checked his blood sugar today. He states he usually checks his blood sugar in the afternoon.  Was not able to state what specific blood sugar was on yesterday afternoon.  RNCM discussed importance of patient checking his blood sugar in the morning fasting. Patient states he James start checking in the morning. RNCM discussed with patient high and low blood sugars.   Patient states if his blood sugar has gotten to low in the past he tries to eat something.   Discussed with patient what he should do if his blood sugar becomes to low or too high. Advised patient to carry around with him glucose tablets or a few pieces of candy in the event his blood sugar drops to low.[name]  RNCM reviewed with patient signs and symptoms of high and low blood sugar. Patient attempted to find his A1C on his my chart account but was unable to locate.  RNCM reviewed patients chart and saw listed  Patient verbally agreed to next follow up with RNCM within 1 week.   PLAN: RNCm James follow up with patient within 1 week.   [name] RN,BSN,CCM [facility] Telephonic  [phone]

## 2018-02-16 NOTE — Telephone Encounter (Signed)
Called patient to see if he was interested in participating in the Cardiac Rehab Program. Patient stated yes. Patient will come in for orientation on 03/25/17 @ 8AM and will attend the 945AM exercise class.  Mailed homework package.

## 2018-02-19 ENCOUNTER — Ambulatory Visit (HOSPITAL_COMMUNITY): Payer: Medicare HMO

## 2018-02-22 ENCOUNTER — Ambulatory Visit (HOSPITAL_COMMUNITY): Payer: Medicare HMO

## 2018-02-22 ENCOUNTER — Ambulatory Visit: Payer: Self-pay | Admitting: Pharmacist

## 2018-02-22 ENCOUNTER — Other Ambulatory Visit: Payer: Self-pay | Admitting: Pharmacist

## 2018-02-22 NOTE — Patient Outreach (Addendum)
[name]) [place]   02/22/2018  James Mcclain Feb 23, 1946 [id number]  Reason for referral: Medication Assistance with Insulin & DM medications  Referral source: [facility] RN telephonic Current insurance:[employer]  PMHx includes but not limited to:  DMT2, HLD, HTN, Afib, CAD (s/p DES to LAD 2014, 08/2017 DES to RI)  Outreach:  Successful telephone call with James Mcclain.  HIPAA identifiers verified. Patient states he is doing well today.  His reports that BGs have been under control.  He remains on Lantus, Invokana and Januvia.  He is having difficulties paying for these medications.  His Last A1c was 7.3.  He denies hypoglycemia (or BG<70).   Objective: Lab Results  Component Value Date   CREATININE 1.93 (H) 02/04/2018   CREATININE 1.90 (H) 02/03/2018   CREATININE 1.48 (H) 02/02/2018    Lab Results  Component Value Date   HGBA1C 7.3 (H) 09/21/2017    Lipid Panel     Component Value Date/Time   CHOL 104 01/13/2018 0841   TRIG 90 01/13/2018 0841   HDL 31 (L) 01/13/2018 0841   CHOLHDL 3.2 11/20/2015 0818   VLDL 16 11/20/2015 0818   LDLCALC 55 01/13/2018 0841    BP Readings from Last 3 Encounters:  02/10/18 124/76  02/03/18 (!) 155/82  01/29/18 (!) 175/87    Allergies  Allergen Reactions  . Betadine [Povidone Iodine] Other (See Comments)    Patient doesn't remember  . Contrast Media [Iodinated Diagnostic Agents] Other (See Comments)    Patient doesn't remember  . Fish Allergy Swelling    Medications Reviewed Today    Reviewed by Lavera Guise, [facility] (Pharmacist) on 02/22/18 at 1438  Med List Status: <None>  Medication Order Taking? Sig Documenting Provider Last Dose Status Informant  acetaminophen (TYLENOL) 325 MG tablet [id number] Yes Take 2 tablets (650 mg total) by mouth every 4 (four) hours as needed for headache or mild pain. Isaiah Serge, NP Taking Active   albuterol (PROVENTIL HFA;VENTOLIN HFA) 108 (90 Base) MCG/ACT  inhaler [id number] Yes Inhale 2 puffs into the lungs every 6 (six) hours as needed for wheezing or shortness of breath. Susy Frizzle, MD Taking Active Self  allopurinol (ZYLOPRIM) 100 MG tablet [id number] Yes Take 1 tablet (100 mg total) by mouth daily. Isaiah Serge, NP Taking Active   amLODipine (NORVASC) 10 MG tablet [id number] Yes Take 1 tablet (10 mg total) by mouth daily. Susy Frizzle, MD Taking Active Self  apixaban (ELIQUIS) 5 MG TABS tablet [id number] Yes Take 1 tablet (5 mg total) by mouth 2 (two) times daily. Lelon Perla, MD Taking Active Self  aspirin EC 81 MG EC tablet [id number] Yes Take 1 tablet (81 mg total) by mouth daily. Isaiah Serge, NP Taking Active   atorvastatin (LIPITOR) 80 MG tablet [id number] Yes Take 1 tablet (80 mg total) by mouth daily at 6 PM. Daune Perch, NP Taking Active Self           Med Note Dara Lords, Delorse Limber   Wed Jan 27, 2018  9:46 PM)    Blood Glucose Monitoring Suppl (ONETOUCH VERIO) w/Device KIT [id number] Yes 1 Device by Does not apply route 2 (two) times daily. E11.9 Susy Frizzle, MD Taking Active Self  canagliflozin [facility]) 100 MG TABS tablet [id number] Yes Take 100 mg by mouth daily before breakfast. [provider] Taking Active Self  carvedilol (COREG) 25 MG tablet [id number] Yes Take 1 tablet (25 mg total) by  mouth 2 (two) times daily. Daune Perch, NP Taking Active Self           Med Note Erling Cruz, ANNA K   Tue Jan 26, 2018 11:12 AM)    clopidogrel (PLAVIX) 75 MG tablet [id number] Yes Take 1 tablet (75 mg total) by mouth daily with breakfast. Lelon Perla, MD Taking Active Self  glucose blood test strip [id number] Yes Check BS bid DX: E11.9 Susy Frizzle, MD Taking Active Self  Insulin Glargine (LANTUS SOLOSTAR) 100 UNIT/ML Solostar Pen [id number] Yes Inject 45 Units into the skin daily. Susy Frizzle, MD Taking Active Self           Med Note Corky Mull   Wed Sep 23, 2017  3:29 PM)    Insulin  Syringe-Needle U-100 (SAFETY INSULIN SYRINGES) 30G X 5/16" 0.5 ML MISC [id number] Yes As needed for insulin Susy Frizzle, MD Taking Active Self  isosorbide mononitrate (IMDUR) 60 MG 24 hr tablet [id number] Yes Take 1 tablet (60 mg total) by mouth daily. Erlene Quan, [place] Taking Active Self  JANUVIA 100 MG tablet [id number] Yes TAKE 1 TABLET(100 MG) BY MOUTH DAILY Susy Frizzle, MD Taking Active Self           Med Note Nyoka Cowden, DAVINA E   Wed Feb 10, 2018  5:01 PM) Patient reports he has run out.   nitroGLYCERIN (NITROSTAT) 0.4 MG SL tablet [id number] Yes Place 1 tablet (0.4 mg total) under the tongue every 5 (five) minutes as needed for chest pain. Do not take more than 3 nitroglycerine Almyra Deforest, [place] Taking Active Self  Vitamin D, Ergocalciferol, (DRISDOL) 1.25 MG (50000 UT) CAPS capsule [id number] Yes Take 50,000 Units by mouth every 7 (seven) days. [provider] Taking Active Self          Assessment:  Drugs sorted by system:  Cardiovascular: aspirin, apixaban, atorvastatin, isosorbide,  nitrates PRN, clopidogrel, carvedilol  Gastrointestinal: pantoprazole  Pulm: albuterol  Endocrine: Lantus, Januvia, Invokana  Vitamins/Minerals/Supplements: vit D  Miscellaneous: allopurinol  Medication Review Findings:  -Patient remains on triple therapy (ASA, clopidogrel, apixaban) for hx of PCI of the OM.  He will continue ASA and Plavix 75 mg for one month, then discontinue ASA given need for apixaban, in the setting of PAF. Patient denies S/Sx of bleeding -Reported BP is stable/at goal. For diabetic patients with CAD, BP recommended < 130/80. -Scr elevated to 1.93 (CrCl ~65m/min)- b/l appears to be 1.7-1.8 per CHL. No renal adjustments required at this time  Medication Assistance Findings:   Extra Help:   '[]'$  Already receiving Full Extra Help  '[]'$  Already receiving Partial Extra Help  '[]'$  Eligible based on reported income and assets  '[x]'$  Not Eligible based on reported  income and assets  Patient Assistance Programs: 1) BEngineer, agriculturalmade by LEufaularequirement met: '[x]'$  Yes '[]'$  No '[]'$  Unknown o Out-of-pocket prescription expenditure met:    '[]'$  Yes '[]'$  No  '[]'$  Unknown  '[x]'$  Not applicable - Alternative option is to apply for Basaglar  made by LOGE Energyas patient may be able to submit letter of financial hardship to this program to override out-of-pocket expenditure.          2)  Januvia made by MDIRECTVo Income requirement met: '[x]'$  Yes '[]'$  No  '[]'$  Unknown o Out-of-pocket prescription expenditure met:   '[]'$  Yes '[]'$  No   '[]'$  Unknown '[x]'$  Not applicable Plan: I will route patient assistance letter to [facility] who  will coordinate patient assistance program application process for medications listed above.  [facility] pharmacy technician will assist with obtaining all required documents from both patient and provider(s) and submit application(s) once completed.  I will follow up with patient next month regarding DAPT   Regina Eck, PharmD, [place]  [phone]

## 2018-02-23 ENCOUNTER — Other Ambulatory Visit: Payer: Self-pay | Admitting: Pharmacy Technician

## 2018-02-23 ENCOUNTER — Telehealth (HOSPITAL_COMMUNITY): Payer: Self-pay

## 2018-02-23 NOTE — Patient Outreach (Signed)
[place] [facility]) Care Management  02/23/2018  JP EASTHAM 1945-04-20 [id number]                                                  Medication Assistance Referral  Referral From: [facility] RPh Jenne Pane  Medication/Company: Nancee Liter / Ralph Leyden Cares Patient application portion:  Mailed Provider application portion: Faxed  to Miguel Aschoff  Medication/Company: Celesta Gentile / [employer] Patient application portion:  Education officer, museum portion:  Mailed to Miguel Aschoff  Medication/Company: Proventil HFA / [employer] Patient application portion:  Education officer, museum portion:  Mailed to Miguel Aschoff   Follow up:  Will follow up with patient in 5-7 business days to confirm application(s) have been received.  Maud Deed Chana Bode [place] Certified Pharmacy Technician [place] Management Direct [phone]

## 2018-02-25 ENCOUNTER — Other Ambulatory Visit: Payer: Self-pay

## 2018-02-25 NOTE — Patient Outreach (Signed)
[place] [facility]) Care Management  02/25/2018  James Mcclain 02-19-1946 [id number]  Transition of care Referral date: 01/28/18 Referral source: primary MD  Referral reason: medication management Insurance: [employer] Attempt #1  Telephone call to patient for assessment follow up. Unable to reach. HIPAA compliant voice message left with call back phone number.   PLAN; RNCM will attempt 2nd telephone call to patient within 4 business days.   James Plowman RN,BSN,CCM [facility] Telephonic  [phone]

## 2018-02-26 ENCOUNTER — Ambulatory Visit (HOSPITAL_COMMUNITY): Payer: Medicare HMO

## 2018-02-26 ENCOUNTER — Telehealth: Payer: Self-pay | Admitting: Cardiology

## 2018-02-26 ENCOUNTER — Other Ambulatory Visit: Payer: Self-pay

## 2018-02-26 NOTE — Telephone Encounter (Signed)
02/26/2018 Patient walked in office with FMLA Form for Dr. Stanford Breed they need form soon as possible because the patient took it to his primary doctor, but it needed to come to Dr. Stanford Breed.  I made the patient aware that Dr. Stanford Breed is out of the office until 03/08/2018.  I inter-office the form and money to [place].  cbr

## 2018-02-26 NOTE — Patient Outreach (Signed)
[place] [facility]) Care Management  02/26/2018  BOND GRIESHOP [birth year] [id number]  Transition of care Referral date: 01/28/18 Referral source: primary MD  Referral reason: medication management Insurance: [facility]  Telephone call to patient regarding transition of care follow up. HIPAA verified with patient.  Patient states he is doing well. Patient reports today's blood sugar is 93.  Patient reports fasting blood sugars within the past week 109, 127, and 172.  Patient states an example of hyperglycemic blood sugar is being thirsty.  RNCM discussed symptoms of high and low blood sugars.  Patient states he received the [facility] care management welcome packet and EMMI education material.   He states he has not been able to look at the packet contents as of yet.  RNCM discussed carb modified diet with patient. Patient states he has been trying to monitor what he eats more closely within the past week. Patient states he will review the EMMI education material on diabetes sent by [facility] this weekend.   PLAN: RNCM will follow up with patient within 1 week.  Patient will review EMMI education material on diabetes to discuss with RNCM at next telephone outreach.   [name] RN,BSN,CCM [facility] Telephonic  [phone]

## 2018-03-01 ENCOUNTER — Ambulatory Visit (HOSPITAL_COMMUNITY): Payer: Medicare HMO

## 2018-03-03 ENCOUNTER — Ambulatory Visit (HOSPITAL_COMMUNITY): Payer: Medicare HMO

## 2018-03-04 ENCOUNTER — Telehealth: Payer: Self-pay | Admitting: Adult Health

## 2018-03-05 ENCOUNTER — Ambulatory Visit (HOSPITAL_COMMUNITY): Payer: Medicare HMO

## 2018-03-08 ENCOUNTER — Ambulatory Visit (HOSPITAL_COMMUNITY): Payer: Medicare HMO

## 2018-03-08 ENCOUNTER — Ambulatory Visit (INDEPENDENT_AMBULATORY_CARE_PROVIDER_SITE_OTHER): Payer: Self-pay

## 2018-03-08 DIAGNOSIS — I6389 Other cerebral infarction: Secondary | ICD-10-CM

## 2018-03-09 LAB — CUP PACEART REMOTE DEVICE CHECK
Date Time Interrogation Session: 20200111224113
Implantable Pulse Generator Implant Date: 20160629

## 2018-03-09 NOTE — Telephone Encounter (Signed)
Disregard

## 2018-03-09 NOTE — Progress Notes (Signed)
Carelink Summary Report / Loop Recorder 

## 2018-03-10 ENCOUNTER — Ambulatory Visit (HOSPITAL_COMMUNITY): Payer: Medicare HMO

## 2018-03-10 ENCOUNTER — Other Ambulatory Visit: Payer: Self-pay | Admitting: Family Medicine

## 2018-03-10 ENCOUNTER — Telehealth: Payer: Self-pay | Admitting: Cardiology

## 2018-03-10 NOTE — Telephone Encounter (Signed)
Spoke w/ pt and informed him that his loop recorder has reached RRT. Pt would like to schedule an appt to discuss having the loop recorder removed. He is aware that a scheduler will be calling him to schedule an appt w/ a provider. Pt is aware that he will be receiving a home monitor return kit so he can send the monitor back to [employer].  

## 2018-03-12 ENCOUNTER — Ambulatory Visit (HOSPITAL_COMMUNITY): Payer: Medicare HMO

## 2018-03-15 ENCOUNTER — Ambulatory Visit (HOSPITAL_COMMUNITY): Payer: Medicare HMO

## 2018-03-15 ENCOUNTER — Ambulatory Visit: Payer: Self-pay | Admitting: Pharmacist

## 2018-03-15 DIAGNOSIS — N183 Chronic kidney disease, stage 3 (moderate): Secondary | ICD-10-CM | POA: Diagnosis not present

## 2018-03-15 DIAGNOSIS — D539 Nutritional anemia, unspecified: Secondary | ICD-10-CM | POA: Diagnosis not present

## 2018-03-15 DIAGNOSIS — E119 Type 2 diabetes mellitus without complications: Secondary | ICD-10-CM | POA: Diagnosis not present

## 2018-03-15 LAB — CUP PACEART REMOTE DEVICE CHECK
Date Time Interrogation Session: 20191209214052
Implantable Pulse Generator Implant Date: 20160629

## 2018-03-16 ENCOUNTER — Other Ambulatory Visit: Payer: Self-pay | Admitting: Pharmacy Technician

## 2018-03-16 ENCOUNTER — Encounter: Payer: Self-pay | Admitting: Pharmacy Technician

## 2018-03-16 NOTE — Patient Outreach (Signed)
[place] [facility]) Care Management  03/16/2018  James Mcclain May 01, 1945 [id number]    Successful call placed to patient regarding patient assistance application(s) for Basaglar , Januvia and Proventil HFA , HIPAA identifiers verified. Patient states that he has not received the patient assistance applications. He also states that he has switched to the HTA [employer] plan as well. I informed him that he would receive certain medications free under his plan until he reaches the coverage gap. He stated he would like me to resend applications so that he can have them on hand to start the process.  Will follow up with patient in 5-7 business days to confirm apps have been received.  Maud Deed Chana Bode [place] Certified Pharmacy Technician [place] Management Direct Dial:[phone]

## 2018-03-17 ENCOUNTER — Ambulatory Visit (HOSPITAL_COMMUNITY): Payer: Medicare HMO

## 2018-03-17 ENCOUNTER — Ambulatory Visit: Payer: Self-pay | Admitting: Pharmacist

## 2018-03-18 ENCOUNTER — Telehealth (HOSPITAL_COMMUNITY): Payer: Self-pay

## 2018-03-18 NOTE — Telephone Encounter (Signed)
Cardiac Rehab Medication Review by a Pharmacist  Does the patient  feel that his/her medications are working for him/her?  yes  Has the patient been experiencing any side effects to the medications prescribed?  no  Does the patient measure his/her own blood pressure or blood glucose at home?  yes once a day for BP, and twice a day for blood sugar  Does the patient have any problems obtaining medications due to transportation or finances?   No,  Except when he hits the donut hole  Understanding of regimen: good Understanding of indications: good Potential of compliance: good   Pharmacist comments: Patient was able to go over his medications thoroughly.   Harrietta Guardian, PharmD PGY1 Pharmacy Resident 03/18/2018    4:37 PM Please check AMION for all [place] numbers

## 2018-03-19 ENCOUNTER — Ambulatory Visit (HOSPITAL_COMMUNITY): Payer: Medicare HMO

## 2018-03-22 ENCOUNTER — Ambulatory Visit (HOSPITAL_COMMUNITY): Payer: Medicare HMO

## 2018-03-22 ENCOUNTER — Ambulatory Visit: Payer: Self-pay | Admitting: Pharmacist

## 2018-03-22 ENCOUNTER — Other Ambulatory Visit: Payer: Self-pay | Admitting: Pharmacist

## 2018-03-22 NOTE — Patient Outreach (Signed)
[place] [facility]) [place]   03/22/2018  SALVADORE VALVANO 15-Dec-1945 [id number]  Reason for referral: Medication Management & Assistance  Referral source: [facility] RN Current insurance:[employer]-CSNP  Outreach:  Successful telephone call with Mr. Ephraim Reichel.  HIPAA identifiers verified. Patient states he is doing well today.  He has still not received PAP applications.  Encouraged patient to look out for "HTA" envelop or previously mailed applications with "[facility] logo" on the outside.  Patient is now an HTA-CSNP member.  He is now able to get his Tier 6 DM medications free of charge for a defined time period.   Patient reports he is still on triple therapy per cardiologist (Aspirin, Plavix, Eliquis).  He denies S/Sx of bleeding and has been following with cardiac rehab.   PLAN: -Will follow up with patient next month regarding PAP applications and medication needs.  Regina Eck, PharmD, [place]  [phone]

## 2018-03-24 ENCOUNTER — Ambulatory Visit (HOSPITAL_COMMUNITY): Payer: Medicare HMO

## 2018-03-25 ENCOUNTER — Inpatient Hospital Stay: Payer: HMO

## 2018-03-25 ENCOUNTER — Encounter (HOSPITAL_COMMUNITY)
Admission: RE | Admit: 2018-03-25 | Discharge: 2018-03-25 | Disposition: A | Discharge status: Home / Self Care | Payer: HMO | Source: Ambulatory Visit | Attending: Cardiology | Admitting: Cardiology

## 2018-03-25 ENCOUNTER — Encounter (HOSPITAL_COMMUNITY): Payer: Self-pay

## 2018-03-25 ENCOUNTER — Inpatient Hospital Stay: Payer: HMO | Attending: Oncology

## 2018-03-25 VITALS — BP 120/60 | HR 86 | Ht 70.0 in | Wt 236.8 lb

## 2018-03-25 VITALS — BP 106/75 | HR 66 | Temp 97.6°F | Resp 19

## 2018-03-25 DIAGNOSIS — D751 Secondary polycythemia: Secondary | ICD-10-CM | POA: Diagnosis not present

## 2018-03-25 DIAGNOSIS — Z9861 Coronary angioplasty status: Secondary | ICD-10-CM

## 2018-03-25 DIAGNOSIS — Z955 Presence of coronary angioplasty implant and graft: Secondary | ICD-10-CM | POA: Diagnosis not present

## 2018-03-25 DIAGNOSIS — I63412 Cerebral infarction due to embolism of left middle cerebral artery: Secondary | ICD-10-CM

## 2018-03-25 DIAGNOSIS — C9000 Multiple myeloma not having achieved remission: Secondary | ICD-10-CM

## 2018-03-25 LAB — CBC WITH DIFFERENTIAL (CANCER CENTER ONLY)
Abs Immature Granulocytes: 0.09 10*3/uL — ABNORMAL HIGH (ref 0.00–0.07)
Basophils Absolute: 0.1 10*3/uL (ref 0.0–0.1)
Basophils Relative: 1 %
Eosinophils Absolute: 0.4 10*3/uL (ref 0.0–0.5)
Eosinophils Relative: 4 %
HCT: 59.3 % — ABNORMAL HIGH (ref 39.0–52.0)
Hemoglobin: 16.1 g/dL (ref 13.0–17.0)
Immature Granulocytes: 1 %
Lymphocytes Relative: 14 %
Lymphs Abs: 1.4 10*3/uL (ref 0.7–4.0)
MCH: 20.1 pg — ABNORMAL LOW (ref 26.0–34.0)
MCHC: 27.2 g/dL — ABNORMAL LOW (ref 30.0–36.0)
MCV: 73.9 fL — ABNORMAL LOW (ref 80.0–100.0)
Monocytes Absolute: 0.8 10*3/uL (ref 0.1–1.0)
Monocytes Relative: 8 %
Neutro Abs: 7.2 10*3/uL (ref 1.7–7.7)
Neutrophils Relative %: 72 %
Platelet Count: 443 10*3/uL — ABNORMAL HIGH (ref 150–400)
RBC: 8.02 MIL/uL — ABNORMAL HIGH (ref 4.22–5.81)
RDW: 23.9 % — ABNORMAL HIGH (ref 11.5–15.5)
WBC Count: 10 10*3/uL (ref 4.0–10.5)
nRBC: 0.4 % — ABNORMAL HIGH (ref 0.0–0.2)

## 2018-03-25 LAB — CMP (CANCER CENTER ONLY)
ALT: 18 U/L (ref 0–44)
AST: 17 U/L (ref 15–41)
Albumin: 3.6 g/dL (ref 3.5–5.0)
Alkaline Phosphatase: 94 U/L (ref 38–126)
Anion gap: 7 (ref 5–15)
BUN: 26 mg/dL — ABNORMAL HIGH (ref 8–23)
CO2: 27 mmol/L (ref 22–32)
Calcium: 8.7 mg/dL — ABNORMAL LOW (ref 8.9–10.3)
Chloride: 108 mmol/L (ref 98–111)
Creatinine: 1.94 mg/dL — ABNORMAL HIGH (ref 0.61–1.24)
GFR, Est AFR Am: 39 mL/min — ABNORMAL LOW (ref >60)
GFR, Estimated: 34 mL/min — ABNORMAL LOW (ref >60)
Glucose, Bld: 114 mg/dL — ABNORMAL HIGH (ref 70–99)
Potassium: 4.8 mmol/L (ref 3.5–5.1)
Sodium: 142 mmol/L (ref 135–145)
Total Bilirubin: 0.7 mg/dL (ref 0.3–1.2)
Total Protein: 7.1 g/dL (ref 6.5–8.1)

## 2018-03-25 NOTE — Progress Notes (Signed)
James Mcclain presents today for phlebotomy per MD orders. Phlebotomy procedure started at 1040 and ended at 1048 555 cc removed. !6 gauge phlebotomy kit was used, RAC. Patient tolerated procedure well. IV needle removed intact.  Vital signs stable. Discharge instructions provided. Alert and oriented, ambulatory at discharge.

## 2018-03-25 NOTE — Progress Notes (Signed)
Cardiac Individual Treatment Plan  Patient Details  Name: JLON BETKER MRN: [id number] Date of Birth: Apr 26, 1945 Referring Provider:     [place] from 03/25/2018 in [place]  Referring Provider  -- Kirk Ruths MD ]      Initial Encounter Date:    CARDIAC REHAB PHASE II ORIENTATION from 03/25/2018 in [place]  Date  03/25/18      Visit Diagnosis: Status post coronary artery stent placement 09/21/17  S/P PTCA (percutaneous transluminal coronary angioplasty) 02/02/18 for ISR [place]  Patient's Home Medications on Admission:  Current Outpatient Medications:  .  acetaminophen (TYLENOL) 325 MG tablet, Take 2 tablets (650 mg total) by mouth every 4 (four) hours as needed for headache or mild pain., Disp: , Rfl:  .  albuterol (PROVENTIL HFA;VENTOLIN HFA) 108 (90 Base) MCG/ACT inhaler, Inhale 2 puffs into the lungs every 6 (six) hours as needed for wheezing or shortness of breath., Disp: 1 Inhaler, Rfl: 0 .  allopurinol (ZYLOPRIM) 100 MG tablet, TAKE 2 TABLETS BY MOUTH ONCE DAILY, Disp: 60 tablet, Rfl: 5 .  amLODipine (NORVASC) 10 MG tablet, Take 1 tablet (10 mg total) by mouth daily., Disp: 90 tablet, Rfl: 3 .  apixaban (ELIQUIS) 5 MG TABS tablet, Take 1 tablet (5 mg total) by mouth 2 (two) times daily., Disp: 180 tablet, Rfl: 3 .  aspirin EC 81 MG EC tablet, Take 1 tablet (81 mg total) by mouth daily., Disp: , Rfl:  .  atorvastatin (LIPITOR) 80 MG tablet, Take 1 tablet (80 mg total) by mouth daily at 6 PM., Disp: 90 tablet, Rfl: 3 .  Blood Glucose Monitoring Suppl (ONETOUCH VERIO) w/Device KIT, 1 Device by Does not apply route 2 (two) times daily. E11.9, Disp: 1 kit, Rfl: 2 .  canagliflozin (INVOKANA) 100 MG TABS tablet, Take 100 mg by mouth daily before breakfast., Disp: , Rfl:  .  carvedilol (COREG) 25 MG tablet, Take 1 tablet (25 mg total) by mouth 2 (two) times daily., Disp: 180 tablet, Rfl:  3 .  clopidogrel (PLAVIX) 75 MG tablet, Take 1 tablet (75 mg total) by mouth daily with breakfast., Disp: 34 tablet, Rfl: 5 .  glucose blood test strip, Check BS bid DX: E11.9, Disp: 100 each, Rfl: 12 .  Insulin Glargine (LANTUS SOLOSTAR) 100 UNIT/ML Solostar Pen, Inject 45 Units into the skin daily., Disp: 15 mL, Rfl: 3 .  Insulin Syringe-Needle U-100 (SAFETY INSULIN SYRINGES) 30G X 5/16" 0.5 ML MISC, As needed for insulin, Disp: 100 each, Rfl: 11 .  isosorbide mononitrate (IMDUR) 60 MG 24 hr tablet, Take 1 tablet (60 mg total) by mouth daily., Disp: 30 tablet, Rfl: 2 .  JANUVIA 100 MG tablet, TAKE 1 TABLET(100 MG) BY MOUTH DAILY, Disp: 90 tablet, Rfl: 3 .  nitroGLYCERIN (NITROSTAT) 0.4 MG SL tablet, Place 1 tablet (0.4 mg total) under the tongue every 5 (five) minutes as needed for chest pain. Do not take more than 3 nitroglycerine, Disp: 25 tablet, Rfl: 3 .  Vitamin D, Ergocalciferol, (DRISDOL) 1.25 MG (50000 UT) CAPS capsule, Take 50,000 Units by mouth every 7 (seven) days., Disp: , Rfl:   Past Medical History: Past Medical History:  Diagnosis Date  . Arthritis    "right hand" (09/21/2017)  . CHF (congestive heart failure) ([place])   . CKD (chronic kidney disease), stage III ([place])   . Contrast media allergy   . Coronary artery disease    a. s/p  Xience DES to the LAD 07/2012. b. 09/21/17 Synergy stent to RI.   . Dilated aortic root ([place])    a. Mildly dilated by echo 09/2013, f/u MRA scheduled for 12/2014.  [name] Dyspnea   . Elevated PSA   . Essential hypertension   . Gout    "on daily RX" (09/21/2017)  . Heart murmur   . History of kidney stones   . Hyperlipidemia   . Insulin dependent diabetes mellitus ([place])   . Kidney stones   . 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 On tele 09/2013.  [name] PAF (paroxysmal atrial fibrillation) ([place])   . Paroxysmal atrial fibrillation (H[facility])   . PFO (patent foramen ovale)    a. By echo 09/2013.  [name] Polycythemia   . Prostate cancer ([place]) 07/15/12   Gleason  7, external beam radiation Tx, Dr. Pilar Jarvis  . PVC's (premature ventricular contractions)   . 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 Stroke [facility])    a. Cryptogenic, 09/2013. Small PFO noted on echo but neg LE duplex. PAF eventually seen on monitor.  . Thoracic aortic aneurysm ([place])    a. 4.5cm by CT 01/2018.    Tobacco Use: Social History   Tobacco Use  Smoking Status Never Smoker  Smokeless Tobacco Never Used    Labs: Recent Review Flowsheet Data    Labs for ITP Cardiac and Pulmonary Rehab Latest Ref Rng & Units 09/21/2017 01/13/2018 02/02/2018 02/02/2018 02/02/2018   Cholestrol 100 - 199 mg/dL - 104 - - -   LDLCALC 0 - 99 mg/dL - 55 - - -   HDL >39 mg/dL - 31(L) - - -   Trlycerides 0 - 149 mg/dL - 90 - - -   Hemoglobin A1c 4.8 - 5.6 % 7.3(H) - - - -   PHART 7.350 - 7.450 - - - 7.344(L) -   PCO2ART 32.0 - 48.0 mmHg - - - 39.8 -   HCO3 20.0 - 28.0 mmol/L - - 22.1 21.7 20.4   TCO2 22 - 32 mmol/L - - _0 ACIDBASEDEF 0.0 - 2.0 mmol/L - - 3.0(H) 4.0(H) 5.0(H)   O2SAT % - - 77.0 97.0 76.0      Capillary Blood Glucose: Lab Results  Component Value Date   GLUCAP 221 (H) 02/03/2018   GLUCAP 311 (H) 02/02/2018   GLUCAP 291 (H) 02/02/2018   GLUCAP 160 (H) 02/02/2018   GLUCAP 165 (H) 02/02/2018     Exercise Target Goals: Exercise Program Goal: Individual exercise prescription set using results from initial 6 min walk test and THRR while considering  patient's activity barriers and safety.   Exercise Prescription Goal: Initial exercise prescription builds to 30-45 minutes a day of aerobic activity, 2-3 days per week.  Home exercise guidelines will be given to patient during program as part of exercise prescription that the participant will acknowledge.  Activity Barriers & Risk Stratification: Activity Barriers & Cardiac Risk Stratification - 03/25/18 0957      Activity Barriers & Cardiac Risk Stratification   Activity Barriers  None     Cardiac Risk Stratification  Moderate       6 Minute Walk: 6 Minute Walk    Row Name 03/25/18 0956         6 Minute Walk   Phase  Initial     Distance  1480 feet     Walk Time  6 minutes     # of Rest  Breaks  0     MPH  2.8     METS  2.73     RPE  11     Perceived Dyspnea   0     VO2 Peak  9.54     Symptoms  No     Resting HR  86 bpm     Resting BP  120/60     Resting Oxygen Saturation   97 %     Exercise Oxygen Saturation  during 6 min walk  98 %     Max Ex. HR  96 bpm     Max Ex. BP  126/64     2 Minute Post BP  122/62        Oxygen Initial Assessment:   Oxygen Re-Evaluation:   Oxygen Discharge (Final Oxygen Re-Evaluation):   Initial Exercise Prescription: Initial Exercise Prescription - 03/25/18 0900      Date of Initial Exercise RX and Referring Provider   Date  03/25/18    Referring Provider  --   Kirk Ruths MD    Expected Discharge Date  07/02/18      Bike   Level  0.7    Minutes  10    METs  2.2      NuStep   Level  2    SPM  75    Minutes  10    METs  2.5      Track   Laps  11    Minutes  10    METs  2.91      Prescription Details   Frequency (times per week)  3x    Duration  Progress to 30 minutes of continuous aerobic without signs/symptoms of physical distress      Intensity   THRR 40-80% of Max Heartrate  60-119    Ratings of Perceived Exertion  11-13    Perceived Dyspnea  0-4      Progression   Progression  Continue progressive overload as per policy without signs/symptoms or physical distress.      Resistance Training   Training Prescription  Yes    Weight  4lbs    Reps  10-15       Perform Capillary Blood Glucose checks as needed.  Exercise Prescription Changes:   Exercise Comments:   Exercise Goals and Review:  Exercise Goals    Row Name 03/25/18 0957             Exercise Goals   Increase Physical Activity  Yes       Intervention  Provide advice, education, support and counseling about physical  activity/exercise needs.;Develop an individualized exercise prescription for aerobic and resistive training based on initial evaluation findings, risk stratification, comorbidities and participant's personal goals.       Expected Outcomes  Short Term: Attend rehab on a regular basis to increase amount of physical activity.;Long Term: Add in home exercise to make exercise part of routine and to increase amount of physical activity.;Long Term: Exercising regularly at least 3-5 days a week.       Increase Strength and Stamina  Yes       Intervention  Provide advice, education, support and counseling about physical activity/exercise needs.;Develop an individualized exercise prescription for aerobic and resistive training based on initial evaluation findings, risk stratification, comorbidities and participant's personal goals.       Expected Outcomes  Short Term: Increase workloads from initial exercise prescription for resistance, speed, and METs.;Short Term: Perform resistance training exercises  routinely during rehab and add in resistance training at home;Long Term: Improve cardiorespiratory fitness, muscular endurance and strength as measured by increased METs and functional capacity (6MWT)       Able to understand and use rate of perceived exertion (RPE) scale  Yes       Intervention  Provide education and explanation on how to use RPE scale       Expected Outcomes  Short Term: Able to use RPE daily in rehab to express subjective intensity level;Long Term:  Able to use RPE to guide intensity level when exercising independently       Able to understand and use Dyspnea scale  Yes       Intervention  Provide education and explanation on how to use Dyspnea scale       Expected Outcomes  Short Term: Able to use Dyspnea scale daily in rehab to express subjective sense of shortness of breath during exertion;Long Term: Able to use Dyspnea scale to guide intensity level when exercising independently        Knowledge and understanding of Target Heart Rate Range (THRR)  Yes       Intervention  Provide education and explanation of THRR including how the numbers were predicted and where they are located for reference       Expected Outcomes  Short Term: Able to state/look up THRR;Short Term: Able to use daily as guideline for intensity in rehab;Long Term: Able to use THRR to govern intensity when exercising independently       Able to check pulse independently  Yes       Intervention  Provide education and demonstration on how to check pulse in carotid and radial arteries.;Review the importance of being able to check your own pulse for safety during independent exercise       Expected Outcomes  Short Term: Able to explain why pulse checking is important during independent exercise;Long Term: Able to check pulse independently and accurately       Understanding of Exercise Prescription  Yes       Intervention  Provide education, explanation, and written materials on patient's individual exercise prescription       Expected Outcomes  Short Term: Able to explain program exercise prescription;Long Term: Able to explain home exercise prescription to exercise independently          Exercise Goals Re-Evaluation :   Discharge Exercise Prescription (Final Exercise Prescription Changes):   Nutrition:  Target Goals: Understanding of nutrition guidelines, daily intake of sodium <157m, cholesterol <2016m calories 30% from fat and 7% or less from saturated fats, daily to have 5 or more servings of fruits and vegetables.  Biometrics: Pre Biometrics - 03/25/18 0957      Pre Biometrics   Height  _0  (1.778 m)    Weight  107.4 kg    Waist Circumference  43 inches    Hip Circumference  46 inches    Waist to Hip Ratio  0.93 %    BMI (Calculated)  33.97    Triceps Skinfold  32 mm    % Body Fat  34.3 %    Grip Strength  45 kg    Flexibility  9 in    Single Leg Stand  4.81 seconds        Nutrition  Therapy Plan and Nutrition Goals: Nutrition Therapy & Goals - 03/25/18 1044      Nutrition Therapy   Diet  heart healthy, carb modified  Personal Nutrition Goals   Nutrition Goal  to be determined       Nutrition Assessments: Nutrition Assessments - 03/25/18 1007      MEDFICTS Scores   Pre Score  63       Nutrition Goals Re-Evaluation:   Nutrition Goals Re-Evaluation:   Nutrition Goals Discharge (Final Nutrition Goals Re-Evaluation):   Psychosocial: Target Goals: Acknowledge presence or absence of significant depression and/or stress, maximize coping skills, provide positive support system. Participant is able to verbalize types and ability to use techniques and skills needed for reducing stress and depression.  Initial Review & Psychosocial Screening: Initial Psych Review & Screening - 03/25/18 0805      Initial Review   Current issues with  None Identified      Family Dynamics   Good Support System?  Yes   Juma lists his sisters as sources of support.      Barriers   Psychosocial barriers to participate in program  There are no identifiable barriers or psychosocial needs.      Screening Interventions   Interventions  Encouraged to exercise       Quality of Life Scores: Quality of Life - 03/25/18 0802      Quality of Life   Select  Quality of Life      Quality of Life Scores   Health/Function Pre  25.77 %    Socioeconomic Pre  27.5 %    Psych/Spiritual Pre  29.14 %    Family Pre  28.5 %    GLOBAL Pre  27.17 %      Scores of 19 and below usually indicate a poorer quality of life in these areas.  A difference of  2-3 points is a clinically meaningful difference.  A difference of 2-3 points in the total score of the Quality of Life Index has been associated with significant improvement in overall quality of life, self-image, physical symptoms, and general health in studies assessing change in quality of life.  PHQ-9: Recent Review Flowsheet Data     Depression screen [facility] 2/9 02/10/2018 12/30/2016 12/16/2016 12/08/2016 05/27/2016   Decreased Interest 0 0 0 0 0   Down, Depressed, Hopeless 0 0 0 0 0   PHQ - 2 Score 0 0 0 0 0   Altered sleeping - - - - 0   Tired, decreased energy - - - - 0   Change in appetite - - - - 0   Feeling bad or failure about yourself  - - - - 0   Trouble concentrating - - - - 0   Moving slowly or fidgety/restless - - - - 0   Suicidal thoughts - - - - 0   PHQ-9 Score - - - - 0   Difficult doing work/chores - - - - Not difficult at all     Interpretation of Total Score  Total Score Depression Severity:  1-4 = Minimal depression, 5-9 = Mild depression, 10-14 = Moderate depression, 15-19 = Moderately severe depression, 20-27 = Severe depression   Psychosocial Evaluation and Intervention:   Psychosocial Re-Evaluation:   Psychosocial Discharge (Final Psychosocial Re-Evaluation):   Vocational Rehabilitation: Provide vocational rehab assistance to qualifying candidates.   Vocational Rehab Evaluation & Intervention: Vocational Rehab - 03/25/18 0802      Initial Vocational Rehab Evaluation & Intervention   Assessment shows need for Vocational Rehabilitation  No       Education: Education Goals: Education classes will be provided on a weekly basis, covering  required topics. Participant will state understanding/return demonstration of topics presented.  Learning Barriers/Preferences: Learning Barriers/Preferences - 03/25/18 1005      Learning Barriers/Preferences   Learning Barriers  Sight    Learning Preferences  Skilled Demonstration;Verbal Instruction;Written Material       Education Topics: Count Your Pulse:  -Group instruction provided by verbal instruction, demonstration, patient participation and written materials to support subject.  Instructors address importance of being able to find your pulse and how to count your pulse when at home without a heart monitor.  Patients get hands on  experience counting their pulse with staff help and individually.   Heart Attack, Angina, and Risk Factor Modification:  -Group instruction provided by verbal instruction, video, and written materials to support subject.  Instructors address signs and symptoms of angina and heart attacks.    Also discuss risk factors for heart disease and how to make changes to improve heart health risk factors.   Functional Fitness:  -Group instruction provided by verbal instruction, demonstration, patient participation, and written materials to support subject.  Instructors address safety measures for doing things around the house.  Discuss how to get up and down off the floor, how to pick things up properly, how to safely get out of a chair without assistance, and balance training.   Meditation and Mindfulness:  -Group instruction provided by verbal instruction, patient participation, and written materials to support subject.  Instructor addresses importance of mindfulness and meditation practice to help reduce stress and improve awareness.  Instructor also leads participants through a meditation exercise.    Stretching for Flexibility and Mobility:  -Group instruction provided by verbal instruction, patient participation, and written materials to support subject.  Instructors lead participants through series of stretches that are designed to increase flexibility thus improving mobility.  These stretches are additional exercise for major muscle groups that are typically performed during regular warm up and cool down.   Hands Only CPR:  -Group verbal, video, and participation provides a basic overview of AHA guidelines for community CPR. Role-play of emergencies allow participants the opportunity to practice calling for help and chest compression technique with discussion of AED use.   Hypertension: -Group verbal and written instruction that provides a basic overview of hypertension including the most  recent diagnostic guidelines, risk factor reduction with self-care instructions and medication management.    Nutrition I class: Heart Healthy Eating:  -Group instruction provided by PowerPoint slides, verbal discussion, and written materials to support subject matter. The instructor gives an explanation and review of the Therapeutic Lifestyle Changes diet recommendations, which includes a discussion on lipid goals, dietary fat, sodium, fiber, plant stanol/sterol esters, sugar, and the components of a well-balanced, healthy diet.   Nutrition II class: Lifestyle Skills:  -Group instruction provided by PowerPoint slides, verbal discussion, and written materials to support subject matter. The instructor gives an explanation and review of label reading, grocery shopping for heart health, heart healthy recipe modifications, and ways to make healthier choices when eating out.   Diabetes Question & Answer:  -Group instruction provided by PowerPoint slides, verbal discussion, and written materials to support subject matter. The instructor gives an explanation and review of diabetes co-morbidities, pre- and post-prandial blood glucose goals, pre-exercise blood glucose goals, signs, symptoms, and treatment of hypoglycemia and hyperglycemia, and foot care basics.   Diabetes Blitz:  -Group instruction provided by PowerPoint slides, verbal discussion, and written materials to support subject matter. The instructor gives an explanation and review of the physiology behind  type 1 and type 2 diabetes, diabetes medications and rational behind using different medications, pre- and post-prandial blood glucose recommendations and Hemoglobin A1c goals, diabetes diet, and exercise including blood glucose guidelines for exercising safely.    Portion Distortion:  -Group instruction provided by PowerPoint slides, verbal discussion, written materials, and food models to support subject matter. The instructor gives an  explanation of serving size versus portion size, changes in portions sizes over the last 20 years, and what consists of a serving from each food group.   Stress Management:  -Group instruction provided by verbal instruction, video, and written materials to support subject matter.  Instructors review role of stress in heart disease and how to cope with stress positively.     Exercising on Your Own:  -Group instruction provided by verbal instruction, power point, and written materials to support subject.  Instructors discuss benefits of exercise, components of exercise, frequency and intensity of exercise, and end points for exercise.  Also discuss use of nitroglycerin and activating EMS.  Review options of places to exercise outside of rehab.  Review guidelines for sex with heart disease.   Cardiac Drugs I:  -Group instruction provided by verbal instruction and written materials to support subject.  Instructor reviews cardiac drug classes: antiplatelets, anticoagulants, beta blockers, and statins.  Instructor discusses reasons, side effects, and lifestyle considerations for each drug class.   Cardiac Drugs II:  -Group instruction provided by verbal instruction and written materials to support subject.  Instructor reviews cardiac drug classes: angiotensin converting enzyme inhibitors (ACE-I), angiotensin II receptor blockers (ARBs), nitrates, and calcium channel blockers.  Instructor discusses reasons, side effects, and lifestyle considerations for each drug class.   Anatomy and Physiology of the Circulatory System:  Group verbal and written instruction and models provide basic cardiac anatomy and physiology, with the coronary electrical and arterial systems. Review of: AMI, Angina, Valve disease, Heart Failure, Peripheral Artery Disease, Cardiac Arrhythmia, Pacemakers, and the ICD.   Other Education:  -Group or individual verbal, written, or video instructions that support the educational  goals of the cardiac rehab program.   Holiday Eating Survival Tips:  -Group instruction provided by PowerPoint slides, verbal discussion, and written materials to support subject matter. The instructor gives patients tips, tricks, and techniques to help them not only survive but enjoy the holidays despite the onslaught of food that accompanies the holidays.   Knowledge Questionnaire Score: Knowledge Questionnaire Score - 03/25/18 0802      Knowledge Questionnaire Score   Pre Score  16/24       Core Components/Risk Factors/Patient Goals at Admission: Personal Goals and Risk Factors at Admission - 03/25/18 1006      Core Components/Risk Factors/Patient Goals on Admission    Weight Management  Yes;Weight Loss;Obesity    Intervention  Weight Management: Develop a combined nutrition and exercise program designed to reach desired caloric intake, while maintaining appropriate intake of nutrient and fiber, sodium and fats, and appropriate energy expenditure required for the weight goal.;Weight Management: Provide education and appropriate resources to help participant work on and attain dietary goals.;Weight Management/Obesity: Establish reasonable short term and long term weight goals.;Obesity: Provide education and appropriate resources to help participant work on and attain dietary goals.    Admit Weight  236 lb 12.4 oz (107.4 kg)    Goal Weight: Long Term  230 lb (104.3 kg)    Expected Outcomes  Short Term: Continue to assess and modify interventions until short term weight is achieved;Long Term: Adherence to  nutrition and physical activity/exercise program aimed toward attainment of established weight goal;Weight Loss: Understanding of general recommendations for a balanced deficit meal plan, which promotes 1-2 lb weight loss per week and includes a negative energy balance of [phone] kcal/d;Understanding recommendations for meals to include 15-35% energy as protein, 25-35% energy from fat,  35-60% energy from carbohydrates, less than 260m of dietary cholesterol, 20-35 gm of total fiber daily;Understanding of distribution of calorie intake throughout the day with the consumption of 4-5 meals/snacks    Diabetes  Yes    Intervention  Provide education about signs/symptoms and action to take for hypo/hyperglycemia.;Provide education about proper nutrition, including hydration, and aerobic/resistive exercise prescription along with prescribed medications to achieve blood glucose in normal ranges: Fasting glucose 65-99 mg/dL    Expected Outcomes  Short Term: Participant verbalizes understanding of the signs/symptoms and immediate care of hyper/hypoglycemia, proper foot care and importance of medication, aerobic/resistive exercise and nutrition plan for blood glucose control.;Long Term: Attainment of HbA1C < 7%.    Hypertension  Yes    Intervention  Provide education on lifestyle modifcations including regular physical activity/exercise, weight management, moderate sodium restriction and increased consumption of fresh fruit, vegetables, and low fat dairy, alcohol moderation, and smoking cessation.;Monitor prescription use compliance.    Expected Outcomes  Short Term: Continued assessment and intervention until BP is < 140/939mHG in hypertensive participants. < 130/8083mG in hypertensive participants with diabetes, heart failure or chronic kidney disease.;Long Term: Maintenance of blood pressure at goal levels.    Lipids  Yes    Intervention  Provide education and support for participant on nutrition & aerobic/resistive exercise along with prescribed medications to achieve LDL <49m95mDL >40mg53m Expected Outcomes  Short Term: Participant states understanding of desired cholesterol values and is compliant with medications prescribed. Participant is following exercise prescription and nutrition guidelines.;Long Term: Cholesterol controlled with medications as prescribed, with individualized  exercise RX and with personalized nutrition plan. Value goals: LDL < 49mg,44m > 40 mg.       Core Components/Risk Factors/Patient Goals Review:    Core Components/Risk Factors/Patient Goals at Discharge (Final Review):    ITP Comments: ITP Comments    Row Name 03/25/18 1010 03/25/18 1114         ITP Comments  Dr. Traci Fransico HimTraci Fransico Himcal Director         Comments: Tyquavious Jabended orientation from 0717 [phone] rules and guidelines for program. Completed 6 minute walk test, Intitial ITP, and exercise prescription.  VSS. Telemetry-Sinus Rhythm with occasional premature beats.  Asymptomatic.Jermar Colter Barnet PallSN 03/25/2018 11:30 AM

## 2018-03-25 NOTE — Patient Instructions (Signed)

## 2018-03-26 ENCOUNTER — Ambulatory Visit (HOSPITAL_COMMUNITY): Payer: Medicare HMO

## 2018-03-26 NOTE — Progress Notes (Signed)
James Mcclain 73 y.o. male DOB: 08/21/1945 MRN: [id number]      Nutrition Note  1. Status post coronary artery stent placement 09/21/17   2. S/P PTCA (percutaneous transluminal coronary angioplasty) 02/02/18 for ISR Ramus    Past Medical History:  Diagnosis Date  . Arthritis    "right hand" (09/21/2017)  . CHF (congestive heart failure) (HCC)   . CKD (chronic kidney disease), stage III (HCC)   . Contrast media allergy   . Coronary artery disease    a. s/p Xience DES to the LAD 07/2012. b. 09/21/17 Synergy stent to RI.   . Dilated aortic root (HCC)    a. Mildly dilated by echo 09/2013, f/u MRA scheduled for 12/2014.  . Dyspnea   . Elevated PSA   . Essential hypertension   . Gout    "on daily RX" (09/21/2017)  . Heart murmur   . History of kidney stones   . Hyperlipidemia   . Insulin dependent diabetes mellitus (HCC)   . Kidney stones   . 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 On tele 09/2013.  . PAF (paroxysmal atrial fibrillation) (HCC)   . Paroxysmal atrial fibrillation (HCC)   . PFO (patent foramen ovale)    a. By echo 09/2013.  . Polycythemia   . Prostate cancer (HCC) 07/15/12   Gleason 7, external beam radiation Tx, Dr. Budzyn  . PVC's (premature ventricular contractions)   . 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 Stroke (HCC)    a. Cryptogenic, 09/2013. Small PFO noted on echo but neg LE duplex. PAF eventually seen on monitor.  . Thoracic aortic aneurysm (HCC)    a. 4.5cm by CT 01/2018.   Meds reviewed.   Current Outpatient Medications (Endocrine & Metabolic):  .  canagliflozin (INVOKANA) 100 MG TABS tablet, Take 100 mg by mouth daily before breakfast. .  Insulin Glargine (LANTUS SOLOSTAR) 100 UNIT/ML Solostar Pen, Inject 45 Units into the skin daily. .  JANUVIA 100 MG tablet, TAKE 1 TABLET(100 MG) BY MOUTH DAILY  Current Outpatient Medications (Cardiovascular):  .  amLODipine (NORVASC) 10 MG tablet, Take 1 tablet (10 mg total) by mouth  daily. .  atorvastatin (LIPITOR) 80 MG tablet, Take 1 tablet (80 mg total) by mouth daily at 6 PM. .  carvedilol (COREG) 25 MG tablet, Take 1 tablet (25 mg total) by mouth 2 (two) times daily. .  isosorbide mononitrate (IMDUR) 60 MG 24 hr tablet, Take 1 tablet (60 mg total) by mouth daily. .  nitroGLYCERIN (NITROSTAT) 0.4 MG SL tablet, Place 1 tablet (0.4 mg total) under the tongue every 5 (five) minutes as needed for chest pain. Do not take more than 3 nitroglycerine  Current Outpatient Medications (Respiratory):  .  albuterol (PROVENTIL HFA;VENTOLIN HFA) 108 (90 Base) MCG/ACT inhaler, Inhale 2 puffs into the lungs every 6 (six) hours as needed for wheezing or shortness of breath.  Current Outpatient Medications (Analgesics):  .  acetaminophen (TYLENOL) 325 MG tablet, Take 2 tablets (650 mg total) by mouth every 4 (four) hours as needed for headache or mild pain. .  allopurinol (ZYLOPRIM) 100 MG tablet, TAKE 2 TABLETS BY MOUTH ONCE DAILY .  aspirin EC 81 MG EC tablet, Take 1 tablet (81 mg total) by mouth daily.  Current Outpatient Medications (Hematological):  .  apixaban (ELIQUIS) 5 MG TABS tablet, Take 1 tablet (5 mg total) by mouth 2 (two) times daily. .  clopidogrel (PLAVIX) 75 MG tablet, Take   1 tablet (75 mg total) by mouth daily with breakfast.  Current Outpatient Medications (Other):  .  Blood Glucose Monitoring Suppl (ONETOUCH VERIO) w/Device KIT, 1 Device by Does not apply route 2 (two) times daily. E11.9 .  glucose blood test strip, Check BS bid DX: E11.9 .  Insulin Syringe-Needle U-100 (SAFETY INSULIN SYRINGES) 30G X 5/16" 0.5 ML MISC, As needed for insulin .  Vitamin D, Ergocalciferol, (DRISDOL) 1.25 MG (50000 UT) CAPS capsule, Take 50,000 Units by mouth every 7 (seven) days.   HT: Ht Readings from Last 1 Encounters:  03/25/18 5' 10" (1.778 m)    WT: Wt Readings from Last 5 Encounters:  03/25/18 236 lb 12.4 oz (107.4 kg)  02/10/18 240 lb (108.9 kg)  02/10/18 242 lb  (109.8 kg)  02/03/18 244 lb 11.4 oz (111 kg)  01/29/18 249 lb 1.9 oz (113 kg)     Body mass index is 33.97 kg/m.   Current tobacco use? No  Labs:  Lipid Panel     Component Value Date/Time   CHOL 104 01/13/2018 0841   TRIG 90 01/13/2018 0841   HDL 31 (L) 01/13/2018 0841   CHOLHDL 3.2 11/20/2015 0818   VLDL 16 11/20/2015 0818   LDLCALC 55 01/13/2018 0841    Lab Results  Component Value Date   HGBA1C 7.3 (H) 09/21/2017   CBG (last 3)  No results for input(s): GLUCAP in the last 72 hours.  Nutrition Note Spoke with pt. Nutrition plan and goals reviewed with pt. Pt is following Step 2 of the Therapeutic Lifestyle Changes diet. Pt wants to lose wt. Pt has not actively been trying to lose wt. Heart healthy diabetic weight loss tips reviewed (label reading, how to build a healthy plate, portion sizes, eating frequently across the day, how to count carbs, serving sizes of carbs at meals). Pt has Type 2 Diabetes. Last A1c indicates blood glucose not optimally controlled, 7.3. This writer went over Diabetes Education test results. Pt checks CBG's 1 times a day. Fasting CBG's reportedly 110-120's mg/dL. Pt with dx of CHF. Per discussion, pt does not use canned/convenience foods often. Pt does not add salt to food. Pt does eat out frequently (5-6x week). Pt expressed understanding of the information reviewed. Pt aware of nutrition education classes offered and would like to attend nutrition classes.  Nutrition Diagnosis ? Food-and nutrition-related knowledge deficit related to lack of exposure to information as related to diagnosis of: ? CVD  ? CHF ? T2DM  ? Obese  I = 30-34.9 related to excessive energy intake as evidenced by a Body mass index is 33.97 kg/m.  Nutrition Intervention ? Pt's individual nutrition plan and goals reviewed with pt. ? Pt given handouts for: ? Nutrition I class ? Nutrition II class  ? Diabetes Blitz Class ? Diabetes Q & A class  ? Consistent vit K diet ? low  sodium ? DM ? pre-diabetes  Nutrition Goal(s):  ? Pt to identify and limit food sources of saturated fat, trans fat, refined carbohydrates and sodium ? Pt to identify food quantities necessary to achieve weight loss of 6-24 lb at graduation from cardiac rehab.  ? Pt to build a healthy plate including vegetables, fruits, whole grains, and low-fat dairy products in a heart healthy meal plan. ? Pt to weigh and measure serving sizes  Plan:  ? Pt to attend nutrition classes:  ? Nutrition I ? Nutrition II ? Portion Distortion  ? Diabetes Blitz ? Diabetes Q & Ae determined ?   Will provide client-centered nutrition education as part of interdisciplinary care ? Monitor and evaluate progress toward nutrition goal with team.   Laurina Bustle, MS, RD, LDN 03/26/2018 11:18 AM

## 2018-03-29 ENCOUNTER — Ambulatory Visit (HOSPITAL_COMMUNITY): Payer: Medicare HMO

## 2018-03-29 DIAGNOSIS — N183 Chronic kidney disease, stage 3 (moderate): Secondary | ICD-10-CM | POA: Diagnosis not present

## 2018-03-29 DIAGNOSIS — I10 Essential (primary) hypertension: Secondary | ICD-10-CM | POA: Diagnosis not present

## 2018-03-29 DIAGNOSIS — E119 Type 2 diabetes mellitus without complications: Secondary | ICD-10-CM | POA: Diagnosis not present

## 2018-03-29 DIAGNOSIS — E559 Vitamin D deficiency, unspecified: Secondary | ICD-10-CM | POA: Diagnosis not present

## 2018-03-30 ENCOUNTER — Ambulatory Visit: Payer: Medicare HMO | Admitting: Cardiology

## 2018-03-30 ENCOUNTER — Other Ambulatory Visit: Payer: Self-pay | Admitting: Pharmacy Technician

## 2018-03-30 NOTE — Patient Outreach (Signed)
[facility]) Care Management  03/30/2018  NAVEN GIAMBALVO 1946/01/11 [id number]    Successful call placed to patient regarding patient assistance application(s) for Basaglar , Januvia and Proventil HFA , HIPAA identifiers verified. Mr. Longenecker states he has not received the 2nd set of patient assistance applications that were mailed out to him on 1/22.  Will route note to [place] to inform, in the meantime requested that patient contact me if he receives them.  Maud Deed Chana Bode [place] Certified Pharmacy Technician [place] Management Direct Dial:[phone]

## 2018-03-31 ENCOUNTER — Ambulatory Visit (HOSPITAL_COMMUNITY): Payer: Medicare HMO

## 2018-04-02 ENCOUNTER — Ambulatory Visit (HOSPITAL_COMMUNITY): Payer: Medicare HMO

## 2018-04-02 ENCOUNTER — Encounter (HOSPITAL_COMMUNITY)
Admission: RE | Admit: 2018-04-02 | Discharge: 2018-04-02 | Disposition: A | Discharge status: Home / Self Care | Payer: HMO | Source: Ambulatory Visit | Attending: Cardiology | Admitting: Cardiology

## 2018-04-02 DIAGNOSIS — Z955 Presence of coronary angioplasty implant and graft: Secondary | ICD-10-CM | POA: Diagnosis not present

## 2018-04-02 DIAGNOSIS — Z9861 Coronary angioplasty status: Secondary | ICD-10-CM | POA: Insufficient documentation

## 2018-04-02 LAB — GLUCOSE, CAPILLARY
Glucose-Capillary: 110 mg/dL — ABNORMAL HIGH (ref 70–99)
Glucose-Capillary: 136 mg/dL — ABNORMAL HIGH (ref 70–99)

## 2018-04-02 NOTE — Progress Notes (Signed)
Daily Session Note  Patient Details  Name: James Mcclain MRN: [id number] Date of Birth: 1945/10/01 Referring Provider:     Novelty from 03/25/2018 in [place]  Referring Provider  -- Kirk Ruths MD ]      Encounter Date: 04/02/2018  Check In: Session Check In - 04/02/18 1003      Check-In   Supervising physician immediately available to respond to emergencies  [facility] immediately available    Physician(s)  Dr. Lonny Prude    Location  [facility]   Sharon Mt, Academic Intern   Staff Present  Barnet Pall, RN, BSN;Other;Olinty Celesta Aver, MS, ACSM CEP, Exercise Physiologist    Medication changes reported      No    Fall or balance concerns reported     No    Tobacco Cessation  No Change    Warm-up and Cool-down  Performed as group-led instruction    Resistance Training Performed  Yes    VAD Patient?  No    PAD/SET Patient?  No      Pain Assessment   Currently in Pain?  No/denies    Multiple Pain Sites  No       Capillary Blood Glucose: Results for orders placed or performed during the hospital encounter of 04/02/18 (from the past 24 hour(s))  Glucose, capillary     Status: Abnormal   Collection Time: 04/02/18  9:35 AM  Result Value Ref Range   Glucose-Capillary 110 (H) 70 - 99 mg/dL  Glucose, capillary     Status: Abnormal   Collection Time: 04/02/18 10:36 AM  Result Value Ref Range   Glucose-Capillary 136 (H) 70 - 99 mg/dL      Social History   Tobacco Use  Smoking Status Never Smoker  Smokeless Tobacco Never Used    Goals Met:  Exercise tolerated well  Goals Unmet:  Not Applicable  Comments: Pt started cardiac rehab today.  Pt tolerated light exercise without difficulty. VSS, telemetry-Sinus Rhythm, asymptomatic.  Medication list reconciled. Pt denies barriers to medicaiton compliance.  PSYCHOSOCIAL ASSESSMENT:  PHQ-0. Pt exhibits positive coping skills, hopeful  outlook with supportive family. No psychosocial needs identified at this time, no psychosocial interventions necessary.    Pt enjoys playing golf.   Pt oriented to exercise equipment and routine.    Understanding verbalized. Barnet Pall, RN,BSN 04/02/2018 10:45 AM   Dr. Fransico Him is Medical Director for Cardiac Rehab at [facility].

## 2018-04-02 NOTE — Progress Notes (Signed)
James Mcclain 73 y.o. male Nutrition Note Spoke with pt. Nutrition plan and goals reviewed with pt. Pt is following heart healthy diet. Pt wants to lose wt. Pt has not actively been trying to lose wt since orientation. Reviewed Heart healthy diabetic weight loss tips with patient (label reading, how to build a healthy plate, portion sizes, eating frequently across the day, how to count carbs, serving sizes of carbs at meals). Pt has Type 2 Diabetes. Last A1c indicates blood glucose not optimally controlled, 7.3. Reviewed with patient goal of getting A1c < 7. Pt checks CBG's 1 times a day, in the evening before taking insulin. Fasting CBG's reportedly 110-120's mg/dL. Pt had a CBG of 110mg /dL before exercise today, given 2 servings of carb (banana). Post exercise CBG 136 mg/dL. Reviewed how to build a healthy meal with patient and discussed importance of getting a snack before exercise. Reviewed ideas for what would be good snacks, distributed snack ideas to patient. Pt with dx of CHF. Per discussion, pt does not use canned/convenience foods often. Pt does not add salt to food. Pt does eat out frequently (5-6x week). Pt expressed understanding of the information reviewed. Pt aware of nutrition education classes offered and would like to attend nutrition classes.  Lab Results  Component Value Date   HGBA1C 7.3 (H) 09/21/2017    Wt Readings from Last 3 Encounters:  03/25/18 236 lb 12.4 oz (107.4 kg)  02/10/18 240 lb (108.9 kg)  02/10/18 242 lb (109.8 kg)    Nutrition Diagnosis  Food-and nutrition-related knowledge deficit related to lack of exposure to information as related to diagnosis of: ? CVD            ? CHF ? T2DM   Obese  I = 30-34.9 related to excessive energy intake as evidenced by a Body mass index is 33.97 kg/m.  Nutrition Intervention ? Pt's individual nutrition plan reviewed with pt. ? Benefits of adopting Heart Healthy diet discussed when Medficts reviewed.    Goal(s)  Pt to  identify and limit food sources of saturated fat, trans fat, refined carbohydrates and sodium  Pt to identify food quantities necessary to achieve weight loss of 6-24 lb at graduation from cardiac rehab.   Pt to build a healthy plate including vegetables, fruits, whole grains, and low-fat dairy products in a heart healthy meal plan.  Pt to weigh and measure serving sizes  Plan:   Pt to attend nutrition classes ? Nutrition I ? Nutrition II ? Portion Distortion   Will provide client-centered nutrition education as part of interdisciplinary care  Monitor and evaluate progress toward nutrition goal with team.    Laurina Bustle, MS, RD, LDN 04/02/2018 10:51 AM

## 2018-04-02 NOTE — Progress Notes (Signed)
Cardiac Individual Treatment Plan  Patient Details  Name: James Mcclain MRN: [id number] Date of Birth: 05/02/1945 Referring Provider:     [facility] from 03/25/2018 in [facility]  Referring Provider  -- Kirk Ruths MD ]      Initial Encounter Date:    CARDIAC REHAB PHASE II ORIENTATION from 03/25/2018 in [facility]  Date  03/25/18      Visit Diagnosis: S/P PTCA (percutaneous transluminal coronary angioplasty) 02/02/18 for ISR [facility]  Patient's Home Medications on Admission:  Current Outpatient Medications:  .  acetaminophen (TYLENOL) 325 MG tablet, Take 2 tablets (650 mg total) by mouth every 4 (four) hours as needed for headache or mild pain., Disp: , Rfl:  .  allopurinol (ZYLOPRIM) 100 MG tablet, TAKE 2 TABLETS BY MOUTH ONCE DAILY, Disp: 60 tablet, Rfl: 5 .  amLODipine (NORVASC) 10 MG tablet, Take 1 tablet (10 mg total) by mouth daily., Disp: 90 tablet, Rfl: 3 .  apixaban (ELIQUIS) 5 MG TABS tablet, Take 1 tablet (5 mg total) by mouth 2 (two) times daily., Disp: 180 tablet, Rfl: 3 .  aspirin EC 81 MG EC tablet, Take 1 tablet (81 mg total) by mouth daily., Disp: , Rfl:  .  atorvastatin (LIPITOR) 80 MG tablet, Take 1 tablet (80 mg total) by mouth daily at 6 PM., Disp: 90 tablet, Rfl: 3 .  Blood Glucose Monitoring Suppl (ONETOUCH VERIO) w/Device KIT, 1 Device by Does not apply route 2 (two) times daily. E11.9, Disp: 1 kit, Rfl: 2 .  canagliflozin (INVOKANA) 100 MG TABS tablet, Take 100 mg by mouth daily before breakfast., Disp: , Rfl:  .  carvedilol (COREG) 25 MG tablet, Take 1 tablet (25 mg total) by mouth 2 (two) times daily., Disp: 180 tablet, Rfl: 3 .  clopidogrel (PLAVIX) 75 MG tablet, Take 1 tablet (75 mg total) by mouth daily with breakfast., Disp: 34 tablet, Rfl: 5 .  glucose blood test strip, Check BS bid DX: E11.9, Disp: 100 each, Rfl: 12 .  Insulin Glargine (LANTUS SOLOSTAR) 100 UNIT/ML  Solostar Pen, Inject 45 Units into the skin daily., Disp: 15 mL, Rfl: 3 .  Insulin Syringe-Needle U-100 (SAFETY INSULIN SYRINGES) 30G X 5/16" 0.5 ML MISC, As needed for insulin, Disp: 100 each, Rfl: 11 .  isosorbide mononitrate (IMDUR) 60 MG 24 hr tablet, Take 1 tablet (60 mg total) by mouth daily., Disp: 30 tablet, Rfl: 2 .  JANUVIA 100 MG tablet, TAKE 1 TABLET(100 MG) BY MOUTH DAILY, Disp: 90 tablet, Rfl: 3 .  nitroGLYCERIN (NITROSTAT) 0.4 MG SL tablet, Place 1 tablet (0.4 mg total) under the tongue every 5 (five) minutes as needed for chest pain. Do not take more than 3 nitroglycerine, Disp: 25 tablet, Rfl: 3 .  Vitamin D, Ergocalciferol, (DRISDOL) 1.25 MG (50000 UT) CAPS capsule, Take 50,000 Units by mouth every 7 (seven) days., Disp: , Rfl:  .  albuterol (PROVENTIL HFA;VENTOLIN HFA) 108 (90 Base) MCG/ACT inhaler, Inhale 2 puffs into the lungs every 6 (six) hours as needed for wheezing or shortness of breath. (Patient not taking: Reported on 04/02/2018), Disp: 1 Inhaler, Rfl: 0  Past Medical History: Past Medical History:  Diagnosis Date  . Arthritis    "right hand" (09/21/2017)  . CHF (congestive heart failure) ([place])   . CKD (chronic kidney disease), stage III ([place])   . Contrast media allergy   . Coronary artery disease    a. s/p Xience DES  to the LAD 07/2012. b. 09/21/17 Synergy stent to RI.   . Dilated aortic root ([place])    a. Mildly dilated by echo 09/2013, f/u MRA scheduled for 12/2014.  [name] Dyspnea   . Elevated PSA   . Essential hypertension   . Gout    "on daily RX" (09/21/2017)  . Heart murmur   . History of kidney stones   . Hyperlipidemia   . Insulin dependent diabetes mellitus ([place])   . Kidney stones   . 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 On tele 09/2013.  [name] PAF (paroxysmal atrial fibrillation) ([place])   . Paroxysmal atrial fibrillation (H[facility])   . PFO (patent foramen ovale)    a. By echo 09/2013.  [name] Polycythemia   . Prostate cancer ([place]) 07/15/12   Gleason 7, external beam  radiation Tx, Dr. Pilar Jarvis  . PVC's (premature ventricular contractions)   . 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 Stroke [facility])    a. Cryptogenic, 09/2013. Small PFO noted on echo but neg LE duplex. PAF eventually seen on monitor.  . Thoracic aortic aneurysm ([place])    a. 4.5cm by CT 01/2018.    Tobacco Use: Social History   Tobacco Use  Smoking Status Never Smoker  Smokeless Tobacco Never Used    Labs: Recent Review Flowsheet Data    Labs for ITP Cardiac and Pulmonary Rehab Latest Ref Rng & Units 09/21/2017 01/13/2018 02/02/2018 02/02/2018 02/02/2018   Cholestrol 100 - 199 mg/dL - 104 - - -   LDLCALC 0 - 99 mg/dL - 55 - - -   HDL >39 mg/dL - 31(L) - - -   Trlycerides 0 - 149 mg/dL - 90 - - -   Hemoglobin A1c 4.8 - 5.6 % 7.3(H) - - - -   PHART 7.350 - 7.450 - - - 7.344(L) -   PCO2ART 32.0 - 48.0 mmHg - - - 39.8 -   HCO3 20.0 - 28.0 mmol/L - - 22.1 21.7 20.4   TCO2 22 - 32 mmol/L - - _0 ACIDBASEDEF 0.0 - 2.0 mmol/L - - 3.0(H) 4.0(H) 5.0(H)   O2SAT % - - 77.0 97.0 76.0      Capillary Blood Glucose: Lab Results  Component Value Date   GLUCAP 136 (H) 04/02/2018   GLUCAP 110 (H) 04/02/2018   GLUCAP 221 (H) 02/03/2018   GLUCAP 311 (H) 02/02/2018   GLUCAP 291 (H) 02/02/2018     Exercise Target Goals: Exercise Program Goal: Individual exercise prescription set using results from initial 6 min walk test and THRR while considering  patient's activity barriers and safety.   Exercise Prescription Goal: Starting with aerobic activity 30 plus minutes a day, 3 days per week for initial exercise prescription. Provide home exercise prescription and guidelines that participant acknowledges understanding prior to discharge.  Activity Barriers & Risk Stratification: Activity Barriers & Cardiac Risk Stratification - 03/25/18 0957      Activity Barriers & Cardiac Risk Stratification   Activity Barriers  None    Cardiac Risk Stratification   Moderate       6 Minute Walk: 6 Minute Walk    Row Name 03/25/18 0956         6 Minute Walk   Phase  Initial     Distance  1480 feet     Walk Time  6 minutes     # of Rest Breaks  0     MPH  2.8     METS  2.73     RPE  11     Perceived Dyspnea   0     VO2 Peak  9.54     Symptoms  No     Resting HR  86 bpm     Resting BP  120/60     Resting Oxygen Saturation   97 %     Exercise Oxygen Saturation  during 6 min walk  98 %     Max Ex. HR  96 bpm     Max Ex. BP  126/64     2 Minute Post BP  122/62        Oxygen Initial Assessment:   Oxygen Re-Evaluation:   Oxygen Discharge (Final Oxygen Re-Evaluation):   Initial Exercise Prescription: Initial Exercise Prescription - 03/25/18 0900      Date of Initial Exercise RX and Referring Provider   Date  03/25/18    Referring Provider  --   Kirk Ruths MD    Expected Discharge Date  07/02/18      Bike   Level  0.7    Minutes  10    METs  2.2      NuStep   Level  2    SPM  75    Minutes  10    METs  2.5      Track   Laps  11    Minutes  10    METs  2.91      Prescription Details   Frequency (times per week)  3x    Duration  Progress to 30 minutes of continuous aerobic without signs/symptoms of physical distress      Intensity   THRR 40-80% of Max Heartrate  60-119    Ratings of Perceived Exertion  11-13    Perceived Dyspnea  0-4      Progression   Progression  Continue progressive overload as per policy without signs/symptoms or physical distress.      Resistance Training   Training Prescription  Yes    Weight  4lbs    Reps  10-15       Perform Capillary Blood Glucose checks as needed.  Exercise Prescription Changes:   Exercise Comments:   Exercise Goals and Review:  Exercise Goals    Row Name 03/25/18 0957             Exercise Goals   Increase Physical Activity  Yes       Intervention  Provide advice, education, support and counseling about physical activity/exercise  needs.;Develop an individualized exercise prescription for aerobic and resistive training based on initial evaluation findings, risk stratification, comorbidities and participant's personal goals.       Expected Outcomes  Short Term: Attend rehab on a regular basis to increase amount of physical activity.;Long Term: Add in home exercise to make exercise part of routine and to increase amount of physical activity.;Long Term: Exercising regularly at least 3-5 days a week.       Increase Strength and Stamina  Yes       Intervention  Provide advice, education, support and counseling about physical activity/exercise needs.;Develop an individualized exercise prescription for aerobic and resistive training based on initial evaluation findings, risk stratification, comorbidities and participant's personal goals.       Expected Outcomes  Short Term: Increase workloads from initial exercise prescription for resistance, speed, and METs.;Short Term: Perform resistance training exercises routinely during rehab and add in resistance training at  home;Long Term: Improve cardiorespiratory fitness, muscular endurance and strength as measured by increased METs and functional capacity (6MWT)       Able to understand and use rate of perceived exertion (RPE) scale  Yes       Intervention  Provide education and explanation on how to use RPE scale       Expected Outcomes  Short Term: Able to use RPE daily in rehab to express subjective intensity level;Long Term:  Able to use RPE to guide intensity level when exercising independently       Able to understand and use Dyspnea scale  Yes       Intervention  Provide education and explanation on how to use Dyspnea scale       Expected Outcomes  Short Term: Able to use Dyspnea scale daily in rehab to express subjective sense of shortness of breath during exertion;Long Term: Able to use Dyspnea scale to guide intensity level when exercising independently       Knowledge and  understanding of Target Heart Rate Range (THRR)  Yes       Intervention  Provide education and explanation of THRR including how the numbers were predicted and where they are located for reference       Expected Outcomes  Short Term: Able to state/look up THRR;Short Term: Able to use daily as guideline for intensity in rehab;Long Term: Able to use THRR to govern intensity when exercising independently       Able to check pulse independently  Yes       Intervention  Provide education and demonstration on how to check pulse in carotid and radial arteries.;Review the importance of being able to check your own pulse for safety during independent exercise       Expected Outcomes  Short Term: Able to explain why pulse checking is important during independent exercise;Long Term: Able to check pulse independently and accurately       Understanding of Exercise Prescription  Yes       Intervention  Provide education, explanation, and written materials on patient's individual exercise prescription       Expected Outcomes  Short Term: Able to explain program exercise prescription;Long Term: Able to explain home exercise prescription to exercise independently          Exercise Goals Re-Evaluation :    Discharge Exercise Prescription (Final Exercise Prescription Changes):   Nutrition:  Target Goals: Understanding of nutrition guidelines, daily intake of sodium <1552m, cholesterol <2060m calories 30% from fat and 7% or less from saturated fats, daily to have 5 or more servings of fruits and vegetables.  Biometrics: Pre Biometrics - 03/25/18 0957      Pre Biometrics   Height  _0  (1.778 m)    Weight  107.4 kg    Waist Circumference  43 inches    Hip Circumference  46 inches    Waist to Hip Ratio  0.93 %    BMI (Calculated)  33.97    Triceps Skinfold  32 mm    % Body Fat  34.3 %    Grip Strength  45 kg    Flexibility  9 in    Single Leg Stand  4.81 seconds        Nutrition Therapy Plan  and Nutrition Goals: Nutrition Therapy & Goals - 03/25/18 1044      Nutrition Therapy   Diet  heart healthy, carb modified       Personal Nutrition Goals   Nutrition  Goal  Pt to identify and limit food sources of saturated fat, trans fat, refined carbohydrates and sodium    Personal Goal #2  Pt to identify food quantities necessary to achieve weight loss of 6-24 lb at graduation from cardiac rehab.    Personal Goal #3  Pt to build a healthy plate including vegetables, fruits, whole grains, and low-fat dairy products in a heart healthy meal plan    Personal Goal #4  Pt to weigh and measure serving sizes      Intervention Plan   Intervention  Prescribe, educate and counsel regarding individualized specific dietary modifications aiming towards targeted core components such as weight, hypertension, lipid management, diabetes, heart failure and other comorbidities.    Expected Outcomes  Short Term Goal: Understand basic principles of dietary content, such as calories, fat, sodium, cholesterol and nutrients.;Long Term Goal: Adherence to prescribed nutrition plan.       Nutrition Assessments: Nutrition Assessments - 03/25/18 1007      MEDFICTS Scores   Pre Score  63       Nutrition Goals Re-Evaluation: Nutrition Goals Re-Evaluation    Row Name 03/25/18 1044             Goals   Current Weight  236 lb 12.4 oz (107.4 kg)          Nutrition Goals Discharge (Final Nutrition Goals Re-Evaluation): Nutrition Goals Re-Evaluation - 03/25/18 1044      Goals   Current Weight  236 lb 12.4 oz (107.4 kg)       Psychosocial: Target Goals: Acknowledge presence or absence of significant depression and/or stress, maximize coping skills, provide positive support system. Participant is able to verbalize types and ability to use techniques and skills needed for reducing stress and depression.  Initial Review & Psychosocial Screening: Initial Psych Review & Screening - 03/25/18 0805       Initial Review   Current issues with  None Identified      Family Dynamics   Good Support System?  Yes   Avaneesh lists his sisters as sources of support.      Barriers   Psychosocial barriers to participate in program  There are no identifiable barriers or psychosocial needs.      Screening Interventions   Interventions  Encouraged to exercise       Quality of Life Scores: Quality of Life - 03/25/18 0802      Quality of Life   Select  Quality of Life      Quality of Life Scores   Health/Function Pre  25.77 %    Socioeconomic Pre  27.5 %    Psych/Spiritual Pre  29.14 %    Family Pre  28.5 %    GLOBAL Pre  27.17 %      Scores of 19 and below usually indicate a poorer quality of life in these areas.  A difference of  2-3 points is a clinically meaningful difference.  A difference of 2-3 points in the total score of the Quality of Life Index has been associated with significant improvement in overall quality of life, self-image, physical symptoms, and general health in studies assessing change in quality of life.  PHQ-9: Recent Review Flowsheet Data    Depression screen [facility] 2/9 04/02/2018 02/10/2018 12/30/2016 12/16/2016 12/08/2016   Decreased Interest 0 0 0 0 0   Down, Depressed, Hopeless 0 0 0 0 0   PHQ - 2 Score 0 0 0 0 0     Interpretation  of Total Score  Total Score Depression Severity:  1-4 = Minimal depression, 5-9 = Mild depression, 10-14 = Moderate depression, 15-19 = Moderately severe depression, 20-27 = Severe depression   Psychosocial Evaluation and Intervention:   Psychosocial Re-Evaluation: Psychosocial Re-Evaluation    [place] Name 04/02/18 1046             Psychosocial Re-Evaluation   Current issues with  None Identified       Interventions  Encouraged to attend Cardiac Rehabilitation for the exercise       Continue Psychosocial Services   No Follow up required          Psychosocial Discharge (Final Psychosocial Re-Evaluation): Psychosocial  Re-Evaluation - 04/02/18 1046      Psychosocial Re-Evaluation   Current issues with  None Identified    Interventions  Encouraged to attend Cardiac Rehabilitation for the exercise    Continue Psychosocial Services   No Follow up required       Vocational Rehabilitation: Provide vocational rehab assistance to qualifying candidates.   Vocational Rehab Evaluation & Intervention: Vocational Rehab - 03/25/18 0802      Initial Vocational Rehab Evaluation & Intervention   Assessment shows need for Vocational Rehabilitation  No       Education: Education Goals: Education classes will be provided on a weekly basis, covering required topics. Participant will state understanding/return demonstration of topics presented.  Learning Barriers/Preferences: Learning Barriers/Preferences - 03/25/18 1005      Learning Barriers/Preferences   Learning Barriers  Sight    Learning Preferences  Skilled Demonstration;Verbal Instruction;Written Material       Education Topics: Hypertension, Hypertension Reduction -Define heart disease and high blood pressure. Discus how high blood pressure affects the body and ways to reduce high blood pressure.   Exercise and Your Heart -Discuss why it is important to exercise, the FITT principles of exercise, normal and abnormal responses to exercise, and how to exercise safely.   Angina -Discuss definition of angina, causes of angina, treatment of angina, and how to decrease risk of having angina.   Cardiac Medications -Review what the following cardiac medications are used for, how they affect the body, and side effects that may occur when taking the medications.  Medications include Aspirin, Beta blockers, calcium channel blockers, ACE Inhibitors, angiotensin receptor blockers, diuretics, digoxin, and antihyperlipidemics.   Congestive Heart Failure -Discuss the definition of CHF, how to live with CHF, the signs and symptoms of CHF, and how keep track of  weight and sodium intake.   Heart Disease and Intimacy -Discus the effect sexual activity has on the heart, how changes occur during intimacy as we age, and safety during sexual activity.   Smoking Cessation / COPD -Discuss different methods to quit smoking, the health benefits of quitting smoking, and the definition of COPD.   Nutrition I: Fats -Discuss the types of cholesterol, what cholesterol does to the heart, and how cholesterol levels can be controlled.   Nutrition II: Labels -Discuss the different components of food labels and how to read food label   Heart Parts/Heart Disease and PAD -Discuss the anatomy of the heart, the pathway of blood circulation through the heart, and these are affected by heart disease.   Stress I: Signs and Symptoms -Discuss the causes of stress, how stress may lead to anxiety and depression, and ways to limit stress.   Stress II: Relaxation -Discuss different types of relaxation techniques to limit stress.   Warning Signs of Stroke / TIA -Discuss definition of  a stroke, what the signs and symptoms are of a stroke, and how to identify when someone is having stroke.   Knowledge Questionnaire Score: Knowledge Questionnaire Score - 03/25/18 0802      Knowledge Questionnaire Score   Pre Score  16/24       Core Components/Risk Factors/Patient Goals at Admission: Personal Goals and Risk Factors at Admission - 03/25/18 1006      Core Components/Risk Factors/Patient Goals on Admission    Weight Management  Yes;Weight Loss;Obesity    Intervention  Weight Management: Develop a combined nutrition and exercise program designed to reach desired caloric intake, while maintaining appropriate intake of nutrient and fiber, sodium and fats, and appropriate energy expenditure required for the weight goal.;Weight Management: Provide education and appropriate resources to help participant work on and attain dietary goals.;Weight Management/Obesity:  Establish reasonable short term and long term weight goals.;Obesity: Provide education and appropriate resources to help participant work on and attain dietary goals.    Admit Weight  236 lb 12.4 oz (107.4 kg)    Goal Weight: Long Term  230 lb (104.3 kg)    Expected Outcomes  Short Term: Continue to assess and modify interventions until short term weight is achieved;Long Term: Adherence to nutrition and physical activity/exercise program aimed toward attainment of established weight goal;Weight Loss: Understanding of general recommendations for a balanced deficit meal plan, which promotes 1-2 lb weight loss per week and includes a negative energy balance of [phone] kcal/d;Understanding recommendations for meals to include 15-35% energy as protein, 25-35% energy from fat, 35-60% energy from carbohydrates, less than 231m of dietary cholesterol, 20-35 gm of total fiber daily;Understanding of distribution of calorie intake throughout the day with the consumption of 4-5 meals/snacks    Diabetes  Yes    Intervention  Provide education about signs/symptoms and action to take for hypo/hyperglycemia.;Provide education about proper nutrition, including hydration, and aerobic/resistive exercise prescription along with prescribed medications to achieve blood glucose in normal ranges: Fasting glucose 65-99 mg/dL    Expected Outcomes  Short Term: Participant verbalizes understanding of the signs/symptoms and immediate care of hyper/hypoglycemia, proper foot care and importance of medication, aerobic/resistive exercise and nutrition plan for blood glucose control.;Long Term: Attainment of HbA1C < 7%.    Hypertension  Yes    Intervention  Provide education on lifestyle modifcations including regular physical activity/exercise, weight management, moderate sodium restriction and increased consumption of fresh fruit, vegetables, and low fat dairy, alcohol moderation, and smoking cessation.;Monitor prescription use  compliance.    Expected Outcomes  Short Term: Continued assessment and intervention until BP is < 140/926mHG in hypertensive participants. < 130/8056mG in hypertensive participants with diabetes, heart failure or chronic kidney disease.;Long Term: Maintenance of blood pressure at goal levels.    Lipids  Yes    Intervention  Provide education and support for participant on nutrition & aerobic/resistive exercise along with prescribed medications to achieve LDL <78m18mDL >40mg36m Expected Outcomes  Short Term: Participant states understanding of desired cholesterol values and is compliant with medications prescribed. Participant is following exercise prescription and nutrition guidelines.;Long Term: Cholesterol controlled with medications as prescribed, with individualized exercise RX and with personalized nutrition plan. Value goals: LDL < 78mg,42m > 40 mg.       Core Components/Risk Factors/Patient Goals Review:  Goals and Risk Factor Review    Row Name 04/02/18 1047             Core Components/Risk Factors/Patient Goals Review  Personal Goals Review  Weight Management/Obesity;Diabetes;Hypertension;Lipids       Review  [place] started cardiac rehab today       Expected [place] will participate in cardiac rehab for exercise, nutrition and lifestyle modifications          Core Components/Risk Factors/Patient Goals at Discharge (Final Review):  Goals and Risk Factor Review - 04/02/18 1047      Core Components/Risk Factors/Patient Goals Review   Personal Goals Review  Weight Management/Obesity;Diabetes;Hypertension;Lipids    Review  [place] started cardiac rehab today    Expected [place] will participate in cardiac rehab for exercise, nutrition and lifestyle modifications       ITP Comments: ITP Comments    Row Name 03/25/18 1010 03/25/18 1114 04/02/18 1055       ITP Comments  Dr. Fransico Him  Dr. Fransico Him, Medical Director  30 Day ITP comments. [place] started  cardiac rehab today.        Comments: See ITP comments.Barnet Pall, RN,BSN 04/02/2018 10:56 AM

## 2018-04-05 ENCOUNTER — Ambulatory Visit (HOSPITAL_COMMUNITY): Payer: Medicare HMO

## 2018-04-05 ENCOUNTER — Encounter (HOSPITAL_COMMUNITY)
Admission: RE | Admit: 2018-04-05 | Discharge: 2018-04-05 | Disposition: A | Discharge status: Home / Self Care | Payer: HMO | Source: Ambulatory Visit | Attending: Cardiology | Admitting: Cardiology

## 2018-04-05 DIAGNOSIS — Z9861 Coronary angioplasty status: Secondary | ICD-10-CM

## 2018-04-05 DIAGNOSIS — Z955 Presence of coronary angioplasty implant and graft: Secondary | ICD-10-CM | POA: Diagnosis not present

## 2018-04-05 LAB — GLUCOSE, CAPILLARY
Glucose-Capillary: 155 mg/dL — ABNORMAL HIGH (ref 70–99)
Glucose-Capillary: 110 mg/dL — ABNORMAL HIGH (ref 70–99)

## 2018-04-05 NOTE — Progress Notes (Signed)
Reviewed home exercise guidelines with patient including endpoints, temperature precautions, target heart rate and rate of perceived exertion. James Mcclain plans to walk as his mode of home exercise. James Mcclain voices understanding of instructions given.   [name] M [name], MS, ACSM CEP  

## 2018-04-07 ENCOUNTER — Ambulatory Visit (HOSPITAL_COMMUNITY): Payer: Medicare HMO

## 2018-04-07 ENCOUNTER — Encounter (HOSPITAL_COMMUNITY)
Admission: RE | Admit: 2018-04-07 | Discharge: 2018-04-07 | Disposition: A | Discharge status: Home / Self Care | Payer: HMO | Source: Ambulatory Visit | Attending: Cardiology | Admitting: Cardiology

## 2018-04-07 DIAGNOSIS — Z955 Presence of coronary angioplasty implant and graft: Secondary | ICD-10-CM | POA: Diagnosis not present

## 2018-04-07 DIAGNOSIS — Z9861 Coronary angioplasty status: Secondary | ICD-10-CM

## 2018-04-07 LAB — GLUCOSE, CAPILLARY: Glucose-Capillary: 134 mg/dL — ABNORMAL HIGH (ref 70–99)

## 2018-04-09 ENCOUNTER — Ambulatory Visit (HOSPITAL_COMMUNITY): Payer: Medicare HMO

## 2018-04-09 ENCOUNTER — Encounter (HOSPITAL_COMMUNITY): Payer: HMO

## 2018-04-12 ENCOUNTER — Ambulatory Visit (HOSPITAL_COMMUNITY): Payer: Medicare HMO

## 2018-04-12 ENCOUNTER — Encounter (HOSPITAL_COMMUNITY): Payer: HMO

## 2018-04-13 DIAGNOSIS — I10 Essential (primary) hypertension: Secondary | ICD-10-CM | POA: Diagnosis not present

## 2018-04-13 DIAGNOSIS — E559 Vitamin D deficiency, unspecified: Secondary | ICD-10-CM | POA: Diagnosis not present

## 2018-04-13 DIAGNOSIS — R5383 Other fatigue: Secondary | ICD-10-CM | POA: Diagnosis not present

## 2018-04-13 DIAGNOSIS — E7800 Pure hypercholesterolemia, unspecified: Secondary | ICD-10-CM | POA: Diagnosis not present

## 2018-04-13 DIAGNOSIS — E119 Type 2 diabetes mellitus without complications: Secondary | ICD-10-CM | POA: Diagnosis not present

## 2018-04-14 ENCOUNTER — Ambulatory Visit (HOSPITAL_COMMUNITY): Payer: Medicare HMO

## 2018-04-14 ENCOUNTER — Encounter (HOSPITAL_COMMUNITY)
Admission: RE | Admit: 2018-04-14 | Discharge: 2018-04-14 | Disposition: A | Discharge status: Home / Self Care | Payer: HMO | Source: Ambulatory Visit | Attending: Cardiology | Admitting: Cardiology

## 2018-04-14 DIAGNOSIS — Z9861 Coronary angioplasty status: Secondary | ICD-10-CM

## 2018-04-14 DIAGNOSIS — Z955 Presence of coronary angioplasty implant and graft: Secondary | ICD-10-CM | POA: Diagnosis not present

## 2018-04-16 ENCOUNTER — Telehealth: Payer: Self-pay | Admitting: Cardiology

## 2018-04-16 ENCOUNTER — Ambulatory Visit (HOSPITAL_COMMUNITY)
Admission: RE | Admit: 2018-04-16 | Discharge: 2018-04-16 | Disposition: A | Discharge status: Home / Self Care | Payer: HMO | Source: Ambulatory Visit | Attending: Family Medicine | Admitting: Family Medicine

## 2018-04-16 ENCOUNTER — Ambulatory Visit (HOSPITAL_COMMUNITY): Payer: Medicare HMO

## 2018-04-16 ENCOUNTER — Encounter (HOSPITAL_COMMUNITY)
Admission: RE | Admit: 2018-04-16 | Discharge: 2018-04-16 | Disposition: A | Discharge status: Home / Self Care | Payer: HMO | Source: Ambulatory Visit | Attending: Cardiology | Admitting: Cardiology

## 2018-04-16 DIAGNOSIS — Z9861 Coronary angioplasty status: Secondary | ICD-10-CM

## 2018-04-16 DIAGNOSIS — Z955 Presence of coronary angioplasty implant and graft: Secondary | ICD-10-CM | POA: Diagnosis not present

## 2018-04-16 LAB — GLUCOSE, CAPILLARY: Glucose-Capillary: 106 mg/dL — ABNORMAL HIGH (ref 70–99)

## 2018-04-16 NOTE — Telephone Encounter (Signed)
Pt at cardiac rehab and was dizzy EKG SR with PACs no a fib.  BP and glucose were stable.  Pt is on eliquis.  He has been having cold symptoms. Have recommended he see PCP or urgent care.  Possible inner ear.

## 2018-04-16 NOTE — Progress Notes (Signed)
Pt exercising on upright bike when he started to complain of dizziness.  BP 140/80. Rhythm irregular.  Previously, pt's rhythm SR with BBB.  Pt taken off of bike to sit. Water intake encouraged. Oxygen saturation 98% on RA. CBG 106. Cecilie Kicks, NP paged.  Orders received for 12 Lead EKG to evaluate rhythm.  NP paged after EKG taken.  Per NP, 12 Lead EKG is SR with frequent PACs.  Pt does report head cold symptoms, temp 98.2.  NP recommends for pt to go home and rest and be evaluated by PCP or Urgent Care to assess dizziness if this persists with cold symptoms. Pt updated and verbalized understanding.  Pt reports decrease in dizziness.

## 2018-04-19 ENCOUNTER — Ambulatory Visit (HOSPITAL_COMMUNITY): Payer: Medicare HMO

## 2018-04-19 ENCOUNTER — Encounter (HOSPITAL_COMMUNITY)
Admission: RE | Admit: 2018-04-19 | Discharge: 2018-04-19 | Disposition: A | Discharge status: Home / Self Care | Payer: HMO | Source: Ambulatory Visit | Attending: Cardiology | Admitting: Cardiology

## 2018-04-19 DIAGNOSIS — Z9861 Coronary angioplasty status: Secondary | ICD-10-CM

## 2018-04-19 DIAGNOSIS — Z955 Presence of coronary angioplasty implant and graft: Secondary | ICD-10-CM | POA: Diagnosis not present

## 2018-04-21 ENCOUNTER — Encounter (HOSPITAL_COMMUNITY)
Admission: RE | Admit: 2018-04-21 | Discharge: 2018-04-21 | Disposition: A | Discharge status: Home / Self Care | Payer: HMO | Source: Ambulatory Visit | Attending: Cardiology | Admitting: Cardiology

## 2018-04-21 ENCOUNTER — Ambulatory Visit (HOSPITAL_COMMUNITY): Payer: Medicare HMO

## 2018-04-21 DIAGNOSIS — Z9861 Coronary angioplasty status: Secondary | ICD-10-CM

## 2018-04-21 DIAGNOSIS — Z955 Presence of coronary angioplasty implant and graft: Secondary | ICD-10-CM | POA: Diagnosis not present

## 2018-04-23 ENCOUNTER — Encounter (HOSPITAL_COMMUNITY)
Admission: RE | Admit: 2018-04-23 | Discharge: 2018-04-23 | Disposition: A | Discharge status: Home / Self Care | Payer: HMO | Source: Ambulatory Visit | Attending: Cardiology | Admitting: Cardiology

## 2018-04-23 ENCOUNTER — Ambulatory Visit (HOSPITAL_COMMUNITY): Payer: Medicare HMO

## 2018-04-23 DIAGNOSIS — Z955 Presence of coronary angioplasty implant and graft: Secondary | ICD-10-CM

## 2018-04-23 DIAGNOSIS — Z9861 Coronary angioplasty status: Secondary | ICD-10-CM

## 2018-04-23 LAB — GLUCOSE, CAPILLARY: Glucose-Capillary: 133 mg/dL — ABNORMAL HIGH (ref 70–99)

## 2018-04-23 NOTE — Progress Notes (Signed)
Cardiac Individual Treatment Plan  Patient Details  Name: James Mcclain MRN: [id number] Date of Birth: 1945-09-12 Referring Provider:     [place] from 03/25/2018 in [place]  Referring Provider  -- Kirk Ruths MD ]      Initial Encounter Date:    CARDIAC REHAB PHASE II ORIENTATION from 03/25/2018 in [place]  Date  03/25/18      Visit Diagnosis: S/P PTCA (percutaneous transluminal coronary angioplasty) 02/02/18 for ISR Ramus  Status post coronary artery stent placement 09/21/17  Patient's Home Medications on Admission:  Current Outpatient Medications:  .  acetaminophen (TYLENOL) 325 MG tablet, Take 2 tablets (650 mg total) by mouth every 4 (four) hours as needed for headache or mild pain., Disp: , Rfl:  .  albuterol (PROVENTIL HFA;VENTOLIN HFA) 108 (90 Base) MCG/ACT inhaler, Inhale 2 puffs into the lungs every 6 (six) hours as needed for wheezing or shortness of breath. (Patient not taking: Reported on 04/02/2018), Disp: 1 Inhaler, Rfl: 0 .  allopurinol (ZYLOPRIM) 100 MG tablet, TAKE 2 TABLETS BY MOUTH ONCE DAILY, Disp: 60 tablet, Rfl: 5 .  amLODipine (NORVASC) 10 MG tablet, Take 1 tablet (10 mg total) by mouth daily., Disp: 90 tablet, Rfl: 3 .  apixaban (ELIQUIS) 5 MG TABS tablet, Take 1 tablet (5 mg total) by mouth 2 (two) times daily., Disp: 180 tablet, Rfl: 3 .  aspirin EC 81 MG EC tablet, Take 1 tablet (81 mg total) by mouth daily., Disp: , Rfl:  .  atorvastatin (LIPITOR) 80 MG tablet, Take 1 tablet (80 mg total) by mouth daily at 6 PM., Disp: 90 tablet, Rfl: 3 .  Blood Glucose Monitoring Suppl (ONETOUCH VERIO) w/Device KIT, 1 Device by Does not apply route 2 (two) times daily. E11.9, Disp: 1 kit, Rfl: 2 .  canagliflozin (INVOKANA) 100 MG TABS tablet, Take 100 mg by mouth daily before breakfast., Disp: , Rfl:  .  carvedilol (COREG) 25 MG tablet, Take 1 tablet (25 mg total) by mouth 2  (two) times daily., Disp: 180 tablet, Rfl: 3 .  clopidogrel (PLAVIX) 75 MG tablet, Take 1 tablet (75 mg total) by mouth daily with breakfast., Disp: 34 tablet, Rfl: 5 .  glucose blood test strip, Check BS bid DX: E11.9, Disp: 100 each, Rfl: 12 .  Insulin Glargine (LANTUS SOLOSTAR) 100 UNIT/ML Solostar Pen, Inject 45 Units into the skin daily., Disp: 15 mL, Rfl: 3 .  Insulin Syringe-Needle U-100 (SAFETY INSULIN SYRINGES) 30G X 5/16" 0.5 ML MISC, As needed for insulin, Disp: 100 each, Rfl: 11 .  isosorbide mononitrate (IMDUR) 60 MG 24 hr tablet, Take 1 tablet (60 mg total) by mouth daily., Disp: 30 tablet, Rfl: 2 .  JANUVIA 100 MG tablet, TAKE 1 TABLET(100 MG) BY MOUTH DAILY, Disp: 90 tablet, Rfl: 3 .  nitroGLYCERIN (NITROSTAT) 0.4 MG SL tablet, Place 1 tablet (0.4 mg total) under the tongue every 5 (five) minutes as needed for chest pain. Do not take more than 3 nitroglycerine, Disp: 25 tablet, Rfl: 3 .  Vitamin D, Ergocalciferol, (DRISDOL) 1.25 MG (50000 UT) CAPS capsule, Take 50,000 Units by mouth every 7 (seven) days., Disp: , Rfl:   Past Medical History: Past Medical History:  Diagnosis Date  . Arthritis    "right hand" (09/21/2017)  . CHF (congestive heart failure) ([place])   . CKD (chronic kidney disease), stage III ([place])   . Contrast media allergy   . Coronary artery  disease    a. s/p Xience DES to the LAD 07/2012. b. 09/21/17 Synergy stent to RI.   . Dilated aortic root ([place])    a. Mildly dilated by echo 09/2013, f/u MRA scheduled for 12/2014.  [name] Dyspnea   . Elevated PSA   . Essential hypertension   . Gout    "on daily RX" (09/21/2017)  . Heart murmur   . History of kidney stones   . Hyperlipidemia   . Insulin dependent diabetes mellitus ([place])   . Kidney stones   . 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 On tele 09/2013.  [name] PAF (paroxysmal atrial fibrillation) ([place])   . Paroxysmal atrial fibrillation ([facility])   . PFO (patent foramen ovale)    a. By echo 09/2013.  [name] Polycythemia   .  Prostate cancer ([place]) 07/15/12   Gleason 7, external beam radiation Tx, Dr. Pilar Jarvis  . PVC's (premature ventricular contractions)   . 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 Stroke [facility])    a. Cryptogenic, 09/2013. Small PFO noted on echo but neg LE duplex. PAF eventually seen on monitor.  . Thoracic aortic aneurysm ([place])    a. 4.5cm by CT 01/2018.    Tobacco Use: Social History   Tobacco Use  Smoking Status Never Smoker  Smokeless Tobacco Never Used    Labs: Recent Review Flowsheet Data    Labs for ITP Cardiac and Pulmonary Rehab Latest Ref Rng & Units 09/21/2017 01/13/2018 02/02/2018 02/02/2018 02/02/2018   Cholestrol 100 - 199 mg/dL - 104 - - -   LDLCALC 0 - 99 mg/dL - 55 - - -   HDL >39 mg/dL - 31(L) - - -   Trlycerides 0 - 149 mg/dL - 90 - - -   Hemoglobin A1c 4.8 - 5.6 % 7.3(H) - - - -   PHART 7.350 - 7.450 - - - 7.344(L) -   PCO2ART 32.0 - 48.0 mmHg - - - 39.8 -   HCO3 20.0 - 28.0 mmol/L - - 22.1 21.7 20.4   TCO2 22 - 32 mmol/L - - '23 23 22   '$ ACIDBASEDEF 0.0 - 2.0 mmol/L - - 3.0(H) 4.0(H) 5.0(H)   O2SAT % - - 77.0 97.0 76.0      Capillary Blood Glucose: Lab Results  Component Value Date   GLUCAP 133 (H) 04/23/2018   GLUCAP 106 (H) 04/16/2018   GLUCAP 134 (H) 04/07/2018   GLUCAP 110 (H) 04/05/2018   GLUCAP 155 (H) 04/05/2018     Exercise Target Goals: Exercise Program Goal: Individual exercise prescription set using results from initial 6 min walk test and THRR while considering  patient's activity barriers and safety.   Exercise Prescription Goal: Initial exercise prescription builds to 30-45 minutes a day of aerobic activity, 2-3 days per week.  Home exercise guidelines will be given to patient during program as part of exercise prescription that the participant will acknowledge.  Activity Barriers & Risk Stratification: Activity Barriers & Cardiac Risk Stratification - 03/25/18 0957      Activity Barriers & Cardiac Risk  Stratification   Activity Barriers  None    Cardiac Risk Stratification  Moderate       6 Minute Walk: 6 Minute Walk    Row Name 03/25/18 0956         6 Minute Walk   Phase  Initial     Distance  1480 feet     Walk Time  6 minutes     #  of Rest Breaks  0     MPH  2.8     METS  2.73     RPE  11     Perceived Dyspnea   0     VO2 Peak  9.54     Symptoms  No     Resting HR  86 bpm     Resting BP  120/60     Resting Oxygen Saturation   97 %     Exercise Oxygen Saturation  during 6 min walk  98 %     Max Ex. HR  96 bpm     Max Ex. BP  126/64     2 Minute Post BP  122/62        Oxygen Initial Assessment:   Oxygen Re-Evaluation:   Oxygen Discharge (Final Oxygen Re-Evaluation):   Initial Exercise Prescription: Initial Exercise Prescription - 03/25/18 0900      Date of Initial Exercise RX and Referring Provider   Date  03/25/18    Referring Provider  --   Kirk Ruths MD    Expected Discharge Date  07/02/18      Bike   Level  0.7    Minutes  10    METs  2.2      NuStep   Level  2    SPM  75    Minutes  10    METs  2.5      Track   Laps  11    Minutes  10    METs  2.91      Prescription Details   Frequency (times per week)  3x    Duration  Progress to 30 minutes of continuous aerobic without signs/symptoms of physical distress      Intensity   THRR 40-80% of Max Heartrate  60-119    Ratings of Perceived Exertion  11-13    Perceived Dyspnea  0-4      Progression   Progression  Continue progressive overload as per policy without signs/symptoms or physical distress.      Resistance Training   Training Prescription  Yes    Weight  4lbs    Reps  10-15       Perform Capillary Blood Glucose checks as needed.  Exercise Prescription Changes:  Exercise Prescription Changes    Row Name 04/02/18 [phone] 04/05/18 0952 04/19/18 0951         Response to Exercise   Blood Pressure (Admit)  114/60  108/62  122/62     Blood Pressure (Exercise)   138/78  140/78  142/82     Blood Pressure (Exit)  108/60  98/60  120/66     Heart Rate (Admit)  93 bpm  95 bpm  94 bpm     Heart Rate (Exercise)  113 bpm  113 bpm  121 bpm     Heart Rate (Exit)  85 bpm  95 bpm  91 bpm     Rating of Perceived Exertion (Exercise)  '12  13  13     '$ Symptoms  none  none  none     Duration  Progress to 30 minutes of  aerobic without signs/symptoms of physical distress  Progress to 30 minutes of  aerobic without signs/symptoms of physical distress  Progress to 30 minutes of  aerobic without signs/symptoms of physical distress     Intensity  THRR unchanged  THRR unchanged  THRR unchanged       Progression   Progression  Continue to progress workloads to maintain intensity without signs/symptoms of physical distress.  Continue to progress workloads to maintain intensity without signs/symptoms of physical distress.  Continue to progress workloads to maintain intensity without signs/symptoms of physical distress.     Average METs  2.2  3  3.5       Resistance Training   Training Prescription  Yes  Yes  Yes     Weight  4lbs  4lbs  2lbs     Reps  10-15  10-15  10-15     Time  10 Minutes  10 Minutes  10 Minutes       Interval Training   Interval Training  No  No  No       Bike   Level  0.7  1.4  1.4     Minutes  '10  10  10     '$ METs  2.23  3.46  3.4       NuStep   Level  2  2  -     SPM  75  75  -     Minutes  10  10  -     METs  1.6  2.3  -       Track   Laps  '11  12  24     '$ Minutes  '10  10  16     '$ METs  2.91  3.09  3.54       Home Exercise Plan   Plans to continue exercise at  -  Home (comment) Walking  Home (comment) Walking     Frequency  -  Add 2 additional days to program exercise sessions.  Add 2 additional days to program exercise sessions.     Initial Home Exercises Provided  -  04/05/18  04/05/18        Exercise Comments:  Exercise Comments    Row Name 04/02/18 4825 04/05/18 1037 04/19/18 1020       Exercise Comments  Patient tolerated  first session of exercise well without difficulty.  Reviewed home exercise guidelines, METs, and goals with patient.  METs reviewed with patient.        Exercise Goals and Review:  Exercise Goals    Row Name 03/25/18 0957             Exercise Goals   Increase Physical Activity  Yes       Intervention  Provide advice, education, support and counseling about physical activity/exercise needs.;Develop an individualized exercise prescription for aerobic and resistive training based on initial evaluation findings, risk stratification, comorbidities and participant's personal goals.       Expected Outcomes  Short Term: Attend rehab on a regular basis to increase amount of physical activity.;Long Term: Add in home exercise to make exercise part of routine and to increase amount of physical activity.;Long Term: Exercising regularly at least 3-5 days a week.       Increase Strength and Stamina  Yes       Intervention  Provide advice, education, support and counseling about physical activity/exercise needs.;Develop an individualized exercise prescription for aerobic and resistive training based on initial evaluation findings, risk stratification, comorbidities and participant's personal goals.       Expected Outcomes  Short Term: Increase workloads from initial exercise prescription for resistance, speed, and METs.;Short Term: Perform resistance training exercises routinely during rehab and add in resistance training at home;Long Term: Improve cardiorespiratory fitness, muscular endurance and strength as measured by increased  METs and functional capacity (6MWT)       Able to understand and use rate of perceived exertion (RPE) scale  Yes       Intervention  Provide education and explanation on how to use RPE scale       Expected Outcomes  Short Term: Able to use RPE daily in rehab to express subjective intensity level;Long Term:  Able to use RPE to guide intensity level when exercising independently        Able to understand and use Dyspnea scale  Yes       Intervention  Provide education and explanation on how to use Dyspnea scale       Expected Outcomes  Short Term: Able to use Dyspnea scale daily in rehab to express subjective sense of shortness of breath during exertion;Long Term: Able to use Dyspnea scale to guide intensity level when exercising independently       Knowledge and understanding of Target Heart Rate Range (THRR)  Yes       Intervention  Provide education and explanation of THRR including how the numbers were predicted and where they are located for reference       Expected Outcomes  Short Term: Able to state/look up THRR;Short Term: Able to use daily as guideline for intensity in rehab;Long Term: Able to use THRR to govern intensity when exercising independently       Able to check pulse independently  Yes       Intervention  Provide education and demonstration on how to check pulse in carotid and radial arteries.;Review the importance of being able to check your own pulse for safety during independent exercise       Expected Outcomes  Short Term: Able to explain why pulse checking is important during independent exercise;Long Term: Able to check pulse independently and accurately       Understanding of Exercise Prescription  Yes       Intervention  Provide education, explanation, and written materials on patient's individual exercise prescription       Expected Outcomes  Short Term: Able to explain program exercise prescription;Long Term: Able to explain home exercise prescription to exercise independently          Exercise Goals Re-Evaluation : Exercise Goals Re-Evaluation    Row Name 04/02/18 1037 04/05/18 1037           Exercise Goal Re-Evaluation   Exercise Goals Review  Increase Physical Activity;Able to understand and use rate of perceived exertion (RPE) scale  Increase Physical Activity;Able to understand and use rate of perceived exertion (RPE) scale;Understanding of  Exercise Prescription;Knowledge and understanding of Target Heart Rate Range (THRR);Able to check pulse independently;Increase Strength and Stamina      Comments  Patient able to understand and use RPE scale appropriately.  Reviewed home exercise guidelines with patient including THRR, RPE scale, and endpoints for exercise. Pt has a heart rate monitor to check his pulse. Pt plans to walk as his mode of home exercise.      Expected Outcomes  Increase workloads as tolerated to help increase and strength and stamina.  Pt will walk 30 minutes, 1-2 days/week at home in addition to exercise at cardiac rehab to help achieve personal health and fitness goals.         Discharge Exercise Prescription (Final Exercise Prescription Changes): Exercise Prescription Changes - 04/19/18 0951      Response to Exercise   Blood Pressure (Admit)  122/62    Blood Pressure (  Exercise)  142/82    Blood Pressure (Exit)  120/66    Heart Rate (Admit)  94 bpm    Heart Rate (Exercise)  121 bpm    Heart Rate (Exit)  91 bpm    Rating of Perceived Exertion (Exercise)  13    Symptoms  none    Duration  Progress to 30 minutes of  aerobic without signs/symptoms of physical distress    Intensity  THRR unchanged      Progression   Progression  Continue to progress workloads to maintain intensity without signs/symptoms of physical distress.    Average METs  3.5      Resistance Training   Training Prescription  Yes    Weight  2lbs    Reps  10-15    Time  10 Minutes      Interval Training   Interval Training  No      Bike   Level  1.4    Minutes  10    METs  3.4      NuStep   Level  --    SPM  --    Minutes  --    METs  --      Track   Laps  24    Minutes  16    METs  3.54      Home Exercise Plan   Plans to continue exercise at  Home (comment)   Walking   Frequency  Add 2 additional days to program exercise sessions.    Initial Home Exercises Provided  04/05/18       Nutrition:  Target Goals:  Understanding of nutrition guidelines, daily intake of sodium '1500mg'$ , cholesterol '200mg'$ , calories 30% from fat and 7% or less from saturated fats, daily to have 5 or more servings of fruits and vegetables.  Biometrics: Pre Biometrics - 03/25/18 0957      Pre Biometrics   Height  '5\' 10"'$  (1.778 m)    Weight  236 lb 12.4 oz (107.4 kg)    Waist Circumference  43 inches    Hip Circumference  46 inches    Waist to Hip Ratio  0.93 %    BMI (Calculated)  33.97    Triceps Skinfold  32 mm    % Body Fat  34.3 %    Grip Strength  45 kg    Flexibility  9 in    Single Leg Stand  4.81 seconds        Nutrition Therapy Plan and Nutrition Goals: Nutrition Therapy & Goals - 03/25/18 1044      Nutrition Therapy   Diet  heart healthy, carb modified       Personal Nutrition Goals   Nutrition Goal  Pt to identify and limit food sources of saturated fat, trans fat, refined carbohydrates and sodium    Personal Goal #2  Pt to identify food quantities necessary to achieve weight loss of 6-24 lb at graduation from cardiac rehab.    Personal Goal #3  Pt to build a healthy plate including vegetables, fruits, whole grains, and low-fat dairy products in a heart healthy meal plan    Personal Goal #4  Pt to weigh and measure serving sizes      Intervention Plan   Intervention  Prescribe, educate and counsel regarding individualized specific dietary modifications aiming towards targeted core components such as weight, hypertension, lipid management, diabetes, heart failure and other comorbidities.    Expected Outcomes  Short Term Goal: Understand basic  principles of dietary content, such as calories, fat, sodium, cholesterol and nutrients.;Long Term Goal: Adherence to prescribed nutrition plan.       Nutrition Assessments: Nutrition Assessments - 03/25/18 1007      MEDFICTS Scores   Pre Score  63       Nutrition Goals Re-Evaluation: Nutrition Goals Re-Evaluation    Row Name 03/25/18 1044              Goals   Current Weight  236 lb 12.4 oz (107.4 kg)          Nutrition Goals Re-Evaluation: Nutrition Goals Re-Evaluation    [place] Name 03/25/18 1044             Goals   Current Weight  236 lb 12.4 oz (107.4 kg)          Nutrition Goals Discharge (Final Nutrition Goals Re-Evaluation): Nutrition Goals Re-Evaluation - 03/25/18 1044      Goals   Current Weight  236 lb 12.4 oz (107.4 kg)       Psychosocial: Target Goals: Acknowledge presence or absence of significant depression and/or stress, maximize coping skills, provide positive support system. Participant is able to verbalize types and ability to use techniques and skills needed for reducing stress and depression.  Initial Review & Psychosocial Screening: Initial Psych Review & Screening - 03/25/18 0805      Initial Review   Current issues with  None Identified      Family Dynamics   Good Support System?  Yes   James Mcclain lists his sisters as sources of support.      Barriers   Psychosocial barriers to participate in program  There are no identifiable barriers or psychosocial needs.      Screening Interventions   Interventions  Encouraged to exercise       Quality of Life Scores: Quality of Life - 03/25/18 0802      Quality of Life   Select  Quality of Life      Quality of Life Scores   Health/Function Pre  25.77 %    Socioeconomic Pre  27.5 %    Psych/Spiritual Pre  29.14 %    Family Pre  28.5 %    GLOBAL Pre  27.17 %      Scores of 19 and below usually indicate a poorer quality of life in these areas.  A difference of  2-3 points is a clinically meaningful difference.  A difference of 2-3 points in the total score of the Quality of Life Index has been associated with significant improvement in overall quality of life, self-image, physical symptoms, and general health in studies assessing change in quality of life.  PHQ-9: Recent Review Flowsheet Data    Depression screen [facility] 2/9 04/02/2018 02/10/2018  12/30/2016 12/16/2016 12/08/2016   Decreased Interest 0 0 0 0 0   Down, Depressed, Hopeless 0 0 0 0 0   PHQ - 2 Score 0 0 0 0 0     Interpretation of Total Score  Total Score Depression Severity:  1-4 = Minimal depression, 5-9 = Mild depression, 10-14 = Moderate depression, 15-19 = Moderately severe depression, 20-27 = Severe depression   Psychosocial Evaluation and Intervention:   Psychosocial Re-Evaluation: Psychosocial Re-Evaluation    James Mcclain Name 04/02/18 1046 04/23/18 1611           Psychosocial Re-Evaluation   Current issues with  None Identified  None Identified      Interventions  Encouraged to attend Cardiac Rehabilitation  for the exercise  Encouraged to attend Cardiac Rehabilitation for the exercise      Continue Psychosocial Services   No Follow up required  No Follow up required         Psychosocial Discharge (Final Psychosocial Re-Evaluation): Psychosocial Re-Evaluation - 04/23/18 1611      Psychosocial Re-Evaluation   Current issues with  None Identified    Interventions  Encouraged to attend Cardiac Rehabilitation for the exercise    Continue Psychosocial Services   No Follow up required       Vocational Rehabilitation: Provide vocational rehab assistance to qualifying candidates.   Vocational Rehab Evaluation & Intervention: Vocational Rehab - 03/25/18 0802      Initial Vocational Rehab Evaluation & Intervention   Assessment shows need for Vocational Rehabilitation  No       Education: Education Goals: Education classes will be provided on a weekly basis, covering required topics. Participant will state understanding/return demonstration of topics presented.  Learning Barriers/Preferences: Learning Barriers/Preferences - 03/25/18 1005      Learning Barriers/Preferences   Learning Barriers  Sight    Learning Preferences  Skilled Demonstration;Verbal Instruction;Written Material       Education Topics: Count Your Pulse:  -Group instruction  provided by verbal instruction, demonstration, patient participation and written materials to support subject.  Instructors address importance of being able to find your pulse and how to count your pulse when at home without a heart monitor.  Patients get hands on experience counting their pulse with staff help and individually.   Heart Attack, Angina, and Risk Factor Modification:  -Group instruction provided by verbal instruction, video, and written materials to support subject.  Instructors address signs and symptoms of angina and heart attacks.    Also discuss risk factors for heart disease and how to make changes to improve heart health risk factors.   Functional Fitness:  -Group instruction provided by verbal instruction, demonstration, patient participation, and written materials to support subject.  Instructors address safety measures for doing things around the house.  Discuss how to get up and down off the floor, how to pick things up properly, how to safely get out of a chair without assistance, and balance training.   Meditation and Mindfulness:  -Group instruction provided by verbal instruction, patient participation, and written materials to support subject.  Instructor addresses importance of mindfulness and meditation practice to help reduce stress and improve awareness.  Instructor also leads participants through a meditation exercise.    Stretching for Flexibility and Mobility:  -Group instruction provided by verbal instruction, patient participation, and written materials to support subject.  Instructors lead participants through series of stretches that are designed to increase flexibility thus improving mobility.  These stretches are additional exercise for major muscle groups that are typically performed during regular warm up and cool down.   Hands Only CPR:  -Group verbal, video, and participation provides a basic overview of AHA guidelines for community CPR. Role-play of  emergencies allow participants the opportunity to practice calling for help and chest compression technique with discussion of AED use.   Hypertension: -Group verbal and written instruction that provides a basic overview of hypertension including the most recent diagnostic guidelines, risk factor reduction with self-care instructions and medication management.    Nutrition I class: Heart Healthy Eating:  -Group instruction provided by PowerPoint slides, verbal discussion, and written materials to support subject matter. The instructor gives an explanation and review of the Therapeutic Lifestyle Changes diet recommendations, which includes a  discussion on lipid goals, dietary fat, sodium, fiber, plant stanol/sterol esters, sugar, and the components of a well-balanced, healthy diet.   Nutrition II class: Lifestyle Skills:  -Group instruction provided by PowerPoint slides, verbal discussion, and written materials to support subject matter. The instructor gives an explanation and review of label reading, grocery shopping for heart health, heart healthy recipe modifications, and ways to make healthier choices when eating out.   Diabetes Question & Answer:  -Group instruction provided by PowerPoint slides, verbal discussion, and written materials to support subject matter. The instructor gives an explanation and review of diabetes co-morbidities, pre- and post-prandial blood glucose goals, pre-exercise blood glucose goals, signs, symptoms, and treatment of hypoglycemia and hyperglycemia, and foot care basics.   Diabetes Blitz:  -Group instruction provided by PowerPoint slides, verbal discussion, and written materials to support subject matter. The instructor gives an explanation and review of the physiology behind type 1 and type 2 diabetes, diabetes medications and rational behind using different medications, pre- and post-prandial blood glucose recommendations and Hemoglobin A1c goals, diabetes  diet, and exercise including blood glucose guidelines for exercising safely.    Portion Distortion:  -Group instruction provided by PowerPoint slides, verbal discussion, written materials, and food models to support subject matter. The instructor gives an explanation of serving size versus portion size, changes in portions sizes over the last 20 years, and what consists of a serving from each food group.   Stress Management:  -Group instruction provided by verbal instruction, video, and written materials to support subject matter.  Instructors review role of stress in heart disease and how to cope with stress positively.     Exercising on Your Own:  -Group instruction provided by verbal instruction, power point, and written materials to support subject.  Instructors discuss benefits of exercise, components of exercise, frequency and intensity of exercise, and end points for exercise.  Also discuss use of nitroglycerin and activating EMS.  Review options of places to exercise outside of rehab.  Review guidelines for sex with heart disease.   Cardiac Drugs I:  -Group instruction provided by verbal instruction and written materials to support subject.  Instructor reviews cardiac drug classes: antiplatelets, anticoagulants, beta blockers, and statins.  Instructor discusses reasons, side effects, and lifestyle considerations for each drug class.   Cardiac Drugs II:  -Group instruction provided by verbal instruction and written materials to support subject.  Instructor reviews cardiac drug classes: angiotensin converting enzyme inhibitors (ACE-I), angiotensin II receptor blockers (ARBs), nitrates, and calcium channel blockers.  Instructor discusses reasons, side effects, and lifestyle considerations for each drug class.   Anatomy and Physiology of the Circulatory System:  Group verbal and written instruction and models provide basic cardiac anatomy and physiology, with the coronary electrical and  arterial systems. Review of: AMI, Angina, Valve disease, Heart Failure, Peripheral Artery Disease, Cardiac Arrhythmia, Pacemakers, and the ICD.   Other Education:  -Group or individual verbal, written, or video instructions that support the educational goals of the cardiac rehab program.   Holiday Eating Survival Tips:  -Group instruction provided by PowerPoint slides, verbal discussion, and written materials to support subject matter. The instructor gives patients tips, tricks, and techniques to help them not only survive but enjoy the holidays despite the onslaught of food that accompanies the holidays.   Knowledge Questionnaire Score: Knowledge Questionnaire Score - 03/25/18 0802      Knowledge Questionnaire Score   Pre Score  16/24       Core Components/Risk Factors/Patient Goals at  Admission: Personal Goals and Risk Factors at Admission - 03/25/18 1006      Core Components/Risk Factors/Patient Goals on Admission    Weight Management  Yes;Weight Loss;Obesity    Intervention  Weight Management: Develop a combined nutrition and exercise program designed to reach desired caloric intake, while maintaining appropriate intake of nutrient and fiber, sodium and fats, and appropriate energy expenditure required for the weight goal.;Weight Management: Provide education and appropriate resources to help participant work on and attain dietary goals.;Weight Management/Obesity: Establish reasonable short term and long term weight goals.;Obesity: Provide education and appropriate resources to help participant work on and attain dietary goals.    Admit Weight  236 lb 12.4 oz (107.4 kg)    Goal Weight: Long Term  230 lb (104.3 kg)    Expected Outcomes  Short Term: Continue to assess and modify interventions until short term weight is achieved;Long Term: Adherence to nutrition and physical activity/exercise program aimed toward attainment of established weight goal;Weight Loss: Understanding of general  recommendations for a balanced deficit meal plan, which promotes 1-2 lb weight loss per week and includes a negative energy balance of [phone] kcal/d;Understanding recommendations for meals to include 15-35% energy as protein, 25-35% energy from fat, 35-60% energy from carbohydrates, less than '200mg'$  of dietary cholesterol, 20-35 gm of total fiber daily;Understanding of distribution of calorie intake throughout the day with the consumption of 4-5 meals/snacks    Diabetes  Yes    Intervention  Provide education about signs/symptoms and action to take for hypo/hyperglycemia.;Provide education about proper nutrition, including hydration, and aerobic/resistive exercise prescription along with prescribed medications to achieve blood glucose in normal ranges: Fasting glucose 65-99 mg/dL    Expected Outcomes  Short Term: Participant verbalizes understanding of the signs/symptoms and immediate care of hyper/hypoglycemia, proper foot care and importance of medication, aerobic/resistive exercise and nutrition plan for blood glucose control.;Long Term: Attainment of HbA1C < 7%.    Hypertension  Yes    Intervention  Provide education on lifestyle modifcations including regular physical activity/exercise, weight management, moderate sodium restriction and increased consumption of fresh fruit, vegetables, and low fat dairy, alcohol moderation, and smoking cessation.;Monitor prescription use compliance.    Expected Outcomes  Short Term: Continued assessment and intervention until BP is < 140/25m HG in hypertensive participants. < 130/865mHG in hypertensive participants with diabetes, heart failure or chronic kidney disease.;Long Term: Maintenance of blood pressure at goal levels.    Lipids  Yes    Intervention  Provide education and support for participant on nutrition & aerobic/resistive exercise along with prescribed medications to achieve LDL '70mg'$ , HDL >'40mg'$ .    Expected Outcomes  Short Term: Participant states  understanding of desired cholesterol values and is compliant with medications prescribed. Participant is following exercise prescription and nutrition guidelines.;Long Term: Cholesterol controlled with medications as prescribed, with individualized exercise RX and with personalized nutrition plan. Value goals: LDL < '70mg'$ , HDL > 40 mg.       Core Components/Risk Factors/Patient Goals Review:  Goals and Risk Factor Review    Row Name 04/02/18 1047 04/23/18 1611           Core Components/Risk Factors/Patient Goals Review   Personal Goals Review  Weight Management/Obesity;Diabetes;Hypertension;Lipids  Weight Management/Obesity;Diabetes;Hypertension;Lipids      Review  WaTheodorotarted cardiac rehab today  WaChalmeras been doing well with exercise. WaZebadiahas had no further complaints of dizziness will continue to monitor      Expected [place] participate in cardiac rehab for  exercise, nutrition and lifestyle modifications  Tiger will participate in cardiac rehab for exercise, nutrition and lifestyle modifications         Core Components/Risk Factors/Patient Goals at Discharge (Final Review):  Goals and Risk Factor Review - 04/23/18 1611      Core Components/Risk Factors/Patient Goals Review   Personal Goals Review  Weight Management/Obesity;Diabetes;Hypertension;Lipids    Review  Sten has been doing well with exercise. Kemarion has had no further complaints of dizziness will continue to monitor    Expected Cornish will participate in cardiac rehab for exercise, nutrition and lifestyle modifications       ITP Comments: ITP Comments    Row Name 03/25/18 1010 03/25/18 1114 04/02/18 1055 04/23/18 1609     ITP Comments  Dr. Fransico Him  Dr. Fransico Him, Medical Director  30 Day ITP comments. Savaughn started cardiac rehab today.  30 Day ITP comments. Patient with good partcipation and attendance in phase 2 cardiac rehab       Comments: See ITP comments.Barnet Pall,  RN,BSN 04/29/2018 12:35 PM

## 2018-04-26 ENCOUNTER — Ambulatory Visit (HOSPITAL_COMMUNITY): Payer: Medicare HMO

## 2018-04-26 ENCOUNTER — Encounter (HOSPITAL_COMMUNITY)
Admission: RE | Admit: 2018-04-26 | Discharge: 2018-04-26 | Disposition: A | Discharge status: Home / Self Care | Payer: HMO | Source: Ambulatory Visit | Attending: Cardiology | Admitting: Cardiology

## 2018-04-26 DIAGNOSIS — Z955 Presence of coronary angioplasty implant and graft: Secondary | ICD-10-CM | POA: Diagnosis not present

## 2018-04-26 DIAGNOSIS — Z9861 Coronary angioplasty status: Secondary | ICD-10-CM | POA: Insufficient documentation

## 2018-04-27 DIAGNOSIS — I10 Essential (primary) hypertension: Secondary | ICD-10-CM | POA: Diagnosis not present

## 2018-04-27 DIAGNOSIS — E7800 Pure hypercholesterolemia, unspecified: Secondary | ICD-10-CM | POA: Diagnosis not present

## 2018-04-27 DIAGNOSIS — E119 Type 2 diabetes mellitus without complications: Secondary | ICD-10-CM | POA: Diagnosis not present

## 2018-04-27 DIAGNOSIS — R718 Other abnormality of red blood cells: Secondary | ICD-10-CM | POA: Diagnosis not present

## 2018-04-28 ENCOUNTER — Encounter (HOSPITAL_COMMUNITY)
Admission: RE | Admit: 2018-04-28 | Discharge: 2018-04-28 | Disposition: A | Discharge status: Home / Self Care | Payer: HMO | Source: Ambulatory Visit | Attending: Cardiology | Admitting: Cardiology

## 2018-04-28 ENCOUNTER — Ambulatory Visit (HOSPITAL_COMMUNITY): Payer: Medicare HMO

## 2018-04-28 DIAGNOSIS — Z9861 Coronary angioplasty status: Secondary | ICD-10-CM

## 2018-04-28 DIAGNOSIS — Z955 Presence of coronary angioplasty implant and graft: Secondary | ICD-10-CM | POA: Diagnosis not present

## 2018-04-30 ENCOUNTER — Encounter (HOSPITAL_COMMUNITY)
Admission: RE | Admit: 2018-04-30 | Discharge: 2018-04-30 | Disposition: A | Discharge status: Home / Self Care | Payer: HMO | Source: Ambulatory Visit | Attending: Cardiology | Admitting: Cardiology

## 2018-04-30 ENCOUNTER — Ambulatory Visit (HOSPITAL_COMMUNITY): Payer: Medicare HMO

## 2018-04-30 DIAGNOSIS — Z955 Presence of coronary angioplasty implant and graft: Secondary | ICD-10-CM | POA: Diagnosis not present

## 2018-04-30 DIAGNOSIS — Z9861 Coronary angioplasty status: Secondary | ICD-10-CM

## 2018-05-03 ENCOUNTER — Encounter: Payer: Self-pay | Admitting: Pharmacy Technician

## 2018-05-03 ENCOUNTER — Encounter (HOSPITAL_COMMUNITY): Payer: Self-pay | Admitting: *Deleted

## 2018-05-03 ENCOUNTER — Other Ambulatory Visit: Payer: Self-pay

## 2018-05-03 ENCOUNTER — Emergency Department (HOSPITAL_COMMUNITY): Payer: HMO

## 2018-05-03 ENCOUNTER — Emergency Department (HOSPITAL_COMMUNITY)
Admission: EM | Admit: 2018-05-03 | Discharge: 2018-05-03 | Disposition: A | Discharge status: Home / Self Care | Payer: HMO | Attending: Emergency Medicine | Admitting: Emergency Medicine

## 2018-05-03 ENCOUNTER — Telehealth: Payer: Self-pay

## 2018-05-03 ENCOUNTER — Ambulatory Visit (HOSPITAL_COMMUNITY): Payer: Medicare HMO

## 2018-05-03 ENCOUNTER — Encounter (HOSPITAL_COMMUNITY)
Admission: RE | Admit: 2018-05-03 | Discharge: 2018-05-03 | Disposition: A | Discharge status: Home / Self Care | Payer: HMO | Source: Ambulatory Visit | Attending: Cardiology | Admitting: Cardiology

## 2018-05-03 DIAGNOSIS — Z9861 Coronary angioplasty status: Secondary | ICD-10-CM

## 2018-05-03 DIAGNOSIS — Z7982 Long term (current) use of aspirin: Secondary | ICD-10-CM | POA: Insufficient documentation

## 2018-05-03 DIAGNOSIS — Z794 Long term (current) use of insulin: Secondary | ICD-10-CM | POA: Diagnosis not present

## 2018-05-03 DIAGNOSIS — R202 Paresthesia of skin: Secondary | ICD-10-CM | POA: Diagnosis not present

## 2018-05-03 DIAGNOSIS — Z8546 Personal history of malignant neoplasm of prostate: Secondary | ICD-10-CM | POA: Insufficient documentation

## 2018-05-03 DIAGNOSIS — I130 Hypertensive heart and chronic kidney disease with heart failure and stage 1 through stage 4 chronic kidney disease, or unspecified chronic kidney disease: Secondary | ICD-10-CM | POA: Diagnosis not present

## 2018-05-03 DIAGNOSIS — N183 Chronic kidney disease, stage 3 (moderate): Secondary | ICD-10-CM | POA: Diagnosis not present

## 2018-05-03 DIAGNOSIS — Z955 Presence of coronary angioplasty implant and graft: Secondary | ICD-10-CM

## 2018-05-03 DIAGNOSIS — Z79899 Other long term (current) drug therapy: Secondary | ICD-10-CM | POA: Insufficient documentation

## 2018-05-03 DIAGNOSIS — R42 Dizziness and giddiness: Secondary | ICD-10-CM | POA: Diagnosis not present

## 2018-05-03 DIAGNOSIS — G9389 Other specified disorders of brain: Secondary | ICD-10-CM | POA: Diagnosis not present

## 2018-05-03 DIAGNOSIS — E1122 Type 2 diabetes mellitus with diabetic chronic kidney disease: Secondary | ICD-10-CM | POA: Diagnosis not present

## 2018-05-03 DIAGNOSIS — Z7901 Long term (current) use of anticoagulants: Secondary | ICD-10-CM | POA: Diagnosis not present

## 2018-05-03 DIAGNOSIS — I2510 Atherosclerotic heart disease of native coronary artery without angina pectoris: Secondary | ICD-10-CM | POA: Diagnosis not present

## 2018-05-03 DIAGNOSIS — I509 Heart failure, unspecified: Secondary | ICD-10-CM | POA: Insufficient documentation

## 2018-05-03 LAB — POCT I-STAT EG7
Acid-base deficit: 2 mmol/L (ref 0.0–2.0)
Bicarbonate: 24 mmol/L (ref 20.0–28.0)
Calcium, Ion: 1.07 mmol/L — ABNORMAL LOW (ref 1.15–1.40)
HCT: 55 % — ABNORMAL HIGH (ref 39.0–52.0)
Hemoglobin: 18.7 g/dL — ABNORMAL HIGH (ref 13.0–17.0)
O2 Saturation: 75 %
Potassium: 4.7 mmol/L (ref 3.5–5.1)
Sodium: 140 mmol/L (ref 135–145)
TCO2: 25 mmol/L (ref 22–32)
pCO2, Ven: 43.5 mmHg — ABNORMAL LOW (ref 44.0–60.0)
pH, Ven: 7.349 (ref 7.250–7.430)
pO2, Ven: 42 mmHg (ref 32.0–45.0)

## 2018-05-03 LAB — TROPONIN I: Troponin I: <0.03 ng/mL (ref <0.03)

## 2018-05-03 LAB — COMPREHENSIVE METABOLIC PANEL
ALT: 18 U/L (ref 0–44)
AST: 19 U/L (ref 15–41)
Albumin: 3.4 g/dL — ABNORMAL LOW (ref 3.5–5.0)
Alkaline Phosphatase: 82 U/L (ref 38–126)
Anion gap: 10 (ref 5–15)
BUN: 27 mg/dL — ABNORMAL HIGH (ref 8–23)
CO2: 21 mmol/L — ABNORMAL LOW (ref 22–32)
Calcium: 8.9 mg/dL (ref 8.9–10.3)
Chloride: 107 mmol/L (ref 98–111)
Creatinine, Ser: 2.01 mg/dL — ABNORMAL HIGH (ref 0.61–1.24)
GFR calc Af Amer: 38 mL/min — ABNORMAL LOW (ref >60)
GFR calc non Af Amer: 32 mL/min — ABNORMAL LOW (ref >60)
Glucose, Bld: 152 mg/dL — ABNORMAL HIGH (ref 70–99)
Potassium: 4.8 mmol/L (ref 3.5–5.1)
Sodium: 138 mmol/L (ref 135–145)
Total Bilirubin: 0.7 mg/dL (ref 0.3–1.2)
Total Protein: 6.9 g/dL (ref 6.5–8.1)

## 2018-05-03 LAB — DIFFERENTIAL
Abs Immature Granulocytes: 0.11 10*3/uL — ABNORMAL HIGH (ref 0.00–0.07)
Basophils Absolute: 0.1 10*3/uL (ref 0.0–0.1)
Basophils Relative: 1 %
Eosinophils Absolute: 0.5 10*3/uL (ref 0.0–0.5)
Eosinophils Relative: 5 %
Immature Granulocytes: 1 %
Lymphocytes Relative: 12 %
Lymphs Abs: 1.3 10*3/uL (ref 0.7–4.0)
Monocytes Absolute: 0.8 10*3/uL (ref 0.1–1.0)
Monocytes Relative: 7 %
Neutro Abs: 8.3 10*3/uL — ABNORMAL HIGH (ref 1.7–7.7)
Neutrophils Relative %: 74 %

## 2018-05-03 LAB — CBC
HCT: 58.4 % — ABNORMAL HIGH (ref 39.0–52.0)
Hemoglobin: 15.4 g/dL (ref 13.0–17.0)
MCH: 19.7 pg — ABNORMAL LOW (ref 26.0–34.0)
MCHC: 26.4 g/dL — ABNORMAL LOW (ref 30.0–36.0)
MCV: 74.6 fL — ABNORMAL LOW (ref 80.0–100.0)
Platelets: 418 10*3/uL — ABNORMAL HIGH (ref 150–400)
RBC: 7.83 MIL/uL — ABNORMAL HIGH (ref 4.22–5.81)
RDW: 23.9 % — ABNORMAL HIGH (ref 11.5–15.5)
WBC: 11 10*3/uL — ABNORMAL HIGH (ref 4.0–10.5)
nRBC: 0.4 % — ABNORMAL HIGH (ref 0.0–0.2)

## 2018-05-03 LAB — GLUCOSE, CAPILLARY: Glucose-Capillary: 142 mg/dL — ABNORMAL HIGH (ref 70–99)

## 2018-05-03 LAB — CBG MONITORING, ED: Glucose-Capillary: 148 mg/dL — ABNORMAL HIGH (ref 70–99)

## 2018-05-03 MED ORDER — SODIUM CHLORIDE 0.9% FLUSH: 3.0000 mL | Freq: Once | INTRAVENOUS | Status: DC

## 2018-05-03 NOTE — ED Triage Notes (Signed)
Pt in c/o dizziness and facial tingling that started while he was on the bike at cardiac rehab, denies focal weakness, tingling is to entire face, has improved some but is still there, states he felt it some this morning when he woke up but it was worse at rehab

## 2018-05-03 NOTE — Telephone Encounter (Signed)
Patient is currently in the ER. 

## 2018-05-03 NOTE — Telephone Encounter (Signed)
Spoke with pt nurse from cardiac rehab who is with pt today. She states that she has consulted with another party while on hold to speak with a triage nurse from [facility] office and she will be transporting pt to ED. She states pt c/o dizziness and tingling to face and reported that he has been taking Eliquis once a day.  Note: med instructions for pt Eliquis 5 mg are "Take 1 tablet (5 mg total) by mouth 2 (two) times daily." Will route to MD for [facility]

## 2018-05-03 NOTE — ED Notes (Signed)
Patient given discharge instructions and verbalized understanding.  Patient stable to discharge at this time.  Patient is alert and oriented to baseline.  No distressed noted at this time.  All belongings taken with the patient at discharge.   

## 2018-05-03 NOTE — Progress Notes (Signed)
Incomplete Session Note  Patient Details  Name: James Mcclain MRN: [id number] Date of Birth: [birth year] Referring Provider:     [place] from 03/25/2018 in [place]  Referring Provider  -- Kirk Ruths MD ]      [name] did not complete his rehab session.  Patient reported feeling a tingling in his face and dizziness on the airdyne. Exercise stopped. CBG 143. Grips equal A/0 x 3 . Patient says he has been only taking his eliquis once a day for the past week because that's the way it was dispensed at the pharmacy. Blood pressure 142/70. Oxygen saturation 98% on room air. Patient also reports experiencing some blurred vsion and says it feels as if someone is shining a light in his face. Patient has a previous history of a CVA.  Dr Jacalyn Lefevre office and the card master called and notified. Patient was taken to the ED for further evaluation. The charge nurse and ED charge nurse notified. Patient sister called and notified about today's events. Patient taken to the ED via transport chair on the monitor. Telemetry rhythm sinus with arrhythmia. Report given to Ed RN.Barnet Pall, RN,BSN 05/03/2018 11:12 AM

## 2018-05-03 NOTE — ED Notes (Signed)
Special anti coag tube given to Levada Dy, phlebotomist.

## 2018-05-05 ENCOUNTER — Ambulatory Visit (HOSPITAL_COMMUNITY): Payer: Medicare HMO

## 2018-05-05 ENCOUNTER — Encounter (HOSPITAL_COMMUNITY)
Admission: RE | Admit: 2018-05-05 | Discharge: 2018-05-05 | Disposition: A | Discharge status: Home / Self Care | Payer: HMO | Source: Ambulatory Visit | Attending: Cardiology | Admitting: Cardiology

## 2018-05-05 DIAGNOSIS — G43109 Migraine with aura, not intractable, without status migrainosus: Secondary | ICD-10-CM | POA: Diagnosis not present

## 2018-05-05 DIAGNOSIS — R42 Dizziness and giddiness: Secondary | ICD-10-CM | POA: Diagnosis not present

## 2018-05-05 DIAGNOSIS — Z9861 Coronary angioplasty status: Secondary | ICD-10-CM

## 2018-05-07 ENCOUNTER — Other Ambulatory Visit: Payer: Self-pay | Admitting: Pharmacist

## 2018-05-07 ENCOUNTER — Encounter (HOSPITAL_COMMUNITY): Payer: HMO

## 2018-05-07 ENCOUNTER — Ambulatory Visit (HOSPITAL_COMMUNITY): Payer: Medicare HMO

## 2018-05-07 ENCOUNTER — Ambulatory Visit: Payer: Self-pay | Admitting: Pharmacist

## 2018-05-07 NOTE — Patient Outreach (Signed)
[facility]) Care Management  05/07/2018  James Mcclain July 29, 1945 [id number]  Counseled patient on Eliquis directions.  Encouraged patient to pick up new RX for Eliquis at [facility]. Patient states he has not received out PAP packet x2.

## 2018-05-10 ENCOUNTER — Encounter (HOSPITAL_COMMUNITY): Payer: HMO

## 2018-05-10 ENCOUNTER — Ambulatory Visit (HOSPITAL_COMMUNITY): Payer: Medicare HMO

## 2018-05-10 ENCOUNTER — Telehealth (HOSPITAL_COMMUNITY): Payer: Self-pay | Admitting: *Deleted

## 2018-05-10 NOTE — Patient Outreach (Addendum)
[facility]) Care Management  05/10/2018  ADONYS WILDES 1945-09-07 [id number]  Successful outreach to Mr. Bistline with HIPAA identifiers verified.  Patient states he is doing okay today.  He was confused about his Eliquis instructions.  Counseled patient to take twice daily as prescribed and that his prescription was waiting to be picked up at [facility].  He has not received patient assistance packets although we have mailed out two.  Will speak with CPhT Etter Sjogren regarding this matter.  Would normally ask patient to come to office to resolve patient assistance, but due to restrictions we are unable to do this.    PLAN: -I will follow up with patient in the next few months (HTA CSNP)   Regina Eck, PharmD, [place]  [phone]

## 2018-05-11 ENCOUNTER — Other Ambulatory Visit: Payer: Self-pay | Admitting: Internal Medicine

## 2018-05-12 ENCOUNTER — Encounter (HOSPITAL_COMMUNITY): Payer: HMO

## 2018-05-12 ENCOUNTER — Ambulatory Visit (HOSPITAL_COMMUNITY): Payer: Medicare HMO

## 2018-05-13 ENCOUNTER — Encounter (HOSPITAL_COMMUNITY): Payer: Self-pay | Admitting: *Deleted

## 2018-05-13 DIAGNOSIS — Z955 Presence of coronary angioplasty implant and graft: Secondary | ICD-10-CM

## 2018-05-13 DIAGNOSIS — Z9861 Coronary angioplasty status: Secondary | ICD-10-CM

## 2018-05-14 ENCOUNTER — Ambulatory Visit (HOSPITAL_COMMUNITY): Payer: Medicare HMO

## 2018-05-14 ENCOUNTER — Encounter (HOSPITAL_COMMUNITY): Payer: HMO

## 2018-05-14 NOTE — ED Provider Notes (Signed)
[facility] EMERGENCY DEPARTMENT Provider Note   CSN: [id number] Arrival date & time: 05/03/18  1031    History   Chief Complaint Chief Complaint  Patient presents with  . Dizziness    HPI James Mcclain is a 73 y.o. male.     HPI   72yM with facial tingling. Happened while on exercise bike at cardiac rehab just prior to arrival. Told staff and referred to the ER. B/l facial tingling. No pain. No numbness elsewhere. No weakness. No change in speech. No no symptoms. Has had this facial tingling previously w/o clear exacerbation. Never sought evaluation for it.    Past Medical History:  Diagnosis Date  . Arthritis    "right hand" (09/21/2017)  . CHF (congestive heart failure) ([place])   . CKD (chronic kidney disease), stage III ([place])   . Contrast media allergy   . Coronary artery disease    a. s/p Xience DES to the LAD 07/2012. b. 09/21/17 Synergy stent to RI.   . Dilated aortic root ([place])    a. Mildly dilated by echo 09/2013, f/u MRA scheduled for 12/2014.  [name] Dyspnea   . Elevated PSA   . Essential hypertension   . Gout    "on daily RX" (09/21/2017)  . Heart murmur   . History of kidney stones   . Hyperlipidemia   . Insulin dependent diabetes mellitus ([place])   . Kidney stones   . 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 On tele 09/2013.  [name] PAF (paroxysmal atrial fibrillation) ([place])   . Paroxysmal atrial fibrillation (HCC)   . PFO (patent foramen ovale)    a. By echo 09/2013.  [name] Polycythemia   . Prostate cancer ([place]) 07/15/12   Gleason 7, external beam radiation Tx, Dr. Pilar Jarvis  . PVC's (premature ventricular contractions)   . 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 Stroke [facility])    a. Cryptogenic, 09/2013. Small PFO noted on echo but neg LE duplex. PAF eventually seen on monitor.  . Thoracic aortic aneurysm ([place])    a. 4.5cm by CT 01/2018.    Patient Active Problem List   Diagnosis Date Noted  . Dyspnea on exertion  02/01/2018  . Unstable angina ([place]) 01/28/2018  . Ascending aortic aneurysm ([place]) 01/27/2018  . History of allergy to radiographic contrast media 01/27/2018  . Acute respiratory distress 01/27/2018  . Hypertensive urgency 01/27/2018  . Chronic anticoagulation 01/26/2018  . Atypical chest pain   . Dizziness   . New-onset angina ([place]) 09/21/2017  . History of snoring 08/05/2017  . Neoplastic malignant related fatigue 08/05/2017  . 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 CKD (chronic kidney disease) stage 3, GFR 30-59 ml/min (HCC)   . AF (paroxysmal atrial fibrillation) ([place]) 07/30/2015  . PFO (patent foramen ovale)   . Dilated aortic root ([place])   . PVC's (premature ventricular contractions)   . Insulin dependent diabetes mellitus ([place])   . Multifocal atrial tachycardia (HCC)   . Polycythemia 05/16/2014  . History of CVA (cerebrovascular accident) 01/02/2014  . 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 Essential hypertension 11/28/2013  . Hyperlipidemia 11/28/2013  . Cerebral infarction due to embolism of left middle cerebral artery ([place]) 11/28/2013  . CAD S/P percutaneous coronary angioplasty 12/17/2012  . Prostate cancer ([place])   . 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 Exertional dyspnea 06/23/2012  . Murmur 06/23/2012  . DM2 (  diabetes mellitus, type 2) ([place])   . Elevated PSA     Past Surgical History:  Procedure Laterality Date  . COLONOSCOPY WITH PROPOFOL N/A 02/22/2016   Procedure: COLONOSCOPY WITH PROPOFOL;  Surgeon: Carol Ada, MD;  Location: [facility];  Service: Endoscopy;  Laterality: N/A;  . CORONARY ANGIOPLASTY WITH STENT PLACEMENT  08/05/2012   LAD, 1 stent  . CORONARY BALLOON ANGIOPLASTY N/A 02/02/2018   Procedure: CORONARY BALLOON ANGIOPLASTY;  Surgeon: Leonie Man, MD;  Location: [facility];  Service: Cardiovascular;  Laterality: N/A;  . CORONARY STENT INTERVENTION N/A 09/21/2017    Procedure: CORONARY STENT INTERVENTION;  Surgeon: Belva Crome, MD;  Location: [place] CV LAB;  Service: Cardiovascular;  Laterality: N/A;  . EP IMPLANTABLE DEVICE N/A 08/23/2014   Procedure: Loop Recorder Insertion;  Surgeon: Evans Lance, MD;  Location: [facility] CV LAB;  Service: Cardiovascular;  Laterality: N/A;  . INSERTION PROSTATE RADIATION SEED    . JOINT REPLACEMENT     left knee replacement   . LEFT HEART CATH AND CORONARY ANGIOGRAPHY N/A 09/21/2017   Procedure: LEFT HEART CATH AND CORONARY ANGIOGRAPHY;  Surgeon: Belva Crome, MD;  Location: [facility] [place] CV [facility];  Service: Cardiovascular;  Laterality: N/A;  . LITHOTRIPSY  2006  . PERCUTANEOUS CORONARY STENT INTERVENTION (PCI-S) N/A 08/05/2012   Procedure: PERCUTANEOUS CORONARY STENT INTERVENTION (PCI-S);  Surgeon: Sherren Mocha, MD;  Location: [facility] CATH LAB;  Service: Cardiovascular;  Laterality: N/A;  . PROSTATE BIOPSY  07/15/2012; 09/23/2013  . RIGHT/LEFT HEART CATH AND CORONARY ANGIOGRAPHY N/A 02/02/2018   Procedure: RIGHT/LEFT HEART CATH AND CORONARY ANGIOGRAPHY;  Surgeon: Leonie Man, MD;  Location: [place] CV LAB;  Service: Cardiovascular;  Laterality: N/A;  . TEE WITHOUT CARDIOVERSION N/A 09/27/2013   Procedure: TRANSESOPHAGEAL ECHOCARDIOGRAM (TEE);  Surgeon: Larey Dresser, MD;  Location: [facility];  Service: Cardiovascular;  Laterality: N/A;  . TONSILLECTOMY  1990s  . TOTAL KNEE ARTHROPLASTY  01/24/2011   Procedure: TOTAL KNEE ARTHROPLASTY;  Surgeon: Alta Corning;  Location: [facility];  Service: Orthopedics;  Laterality: Left;  COMPUTER ASSISTED LEFT  TOTAL KNEE REPLACEMENT. Anesthesia a combination of regional and general.  . ULTRASOUND GUIDANCE FOR VASCULAR ACCESS  02/02/2018   Procedure: Ultrasound Guidance For Vascular Access;  Surgeon: Leonie Man, MD;  Location: [facility] CV LAB;  Service: Cardiovascular;;        Home Medications    Prior to Admission medications   Medication Sig Start Date  End Date Taking? Authorizing Provider  acetaminophen (TYLENOL) 325 MG tablet Take 2 tablets (650 mg total) by mouth every 4 (four) hours as needed for headache or mild pain. 02/03/18  Yes Isaiah Serge, NP  albuterol (PROVENTIL HFA;VENTOLIN HFA) 108 (90 Base) MCG/ACT inhaler Inhale 2 puffs into the lungs every 6 (six) hours as needed for wheezing or shortness of breath. 08/28/17  Yes Susy Frizzle, MD  allopurinol (ZYLOPRIM) 100 MG tablet TAKE 2 TABLETS BY MOUTH ONCE DAILY 03/10/18  Yes Susy Frizzle, MD  amLODipine (NORVASC) 10 MG tablet Take 1 tablet (10 mg total) by mouth daily. 09/19/16  Yes Susy Frizzle, MD  apixaban (ELIQUIS) 5 MG TABS tablet Take 1 tablet (5 mg total) by mouth 2 (two) times daily. 01/18/18  Yes Lelon Perla, MD  aspirin EC 81 MG EC tablet Take 1 tablet (81 mg total) by mouth daily. Patient taking differently: Take 81 mg by mouth daily as needed for pain.  02/04/18  Yes Isaiah Serge, NP  atorvastatin (LIPITOR) 80 MG tablet Take 1 tablet (80 mg total) by mouth daily at 6 PM. 09/22/17  Yes Daune Perch, NP  canagliflozin (INVOKANA) 100 MG TABS tablet Take 100 mg by mouth daily before breakfast.   Yes [provider]  carvedilol (COREG) 25 MG tablet Take 1 tablet (25 mg total) by mouth 2 (two) times daily. 09/22/17 09/22/18 Yes Daune Perch, NP  clopidogrel (PLAVIX) 75 MG tablet Take 1 tablet (75 mg total) by mouth daily with breakfast. 11/12/17  Yes Crenshaw, Denice Bors, MD  Insulin Glargine (LANTUS SOLOSTAR) 100 UNIT/ML Solostar Pen Inject 45 Units into the skin daily. 08/18/16  Yes Susy Frizzle, MD  isosorbide mononitrate (IMDUR) 30 MG 24 hr tablet Take 30 mg by mouth daily. 04/13/18  Yes [provider]  JANUVIA 100 MG tablet TAKE 1 TABLET(100 MG) BY MOUTH DAILY Patient taking differently: Take 100 mg by mouth daily.  10/05/17  Yes Susy Frizzle, MD  nitroGLYCERIN (NITROSTAT) 0.4 MG SL tablet Place 1 tablet (0.4 mg total) under the  tongue every 5 (five) minutes as needed for chest pain. Do not take more than 3 nitroglycerine 10/23/17 10/23/18 Yes Meng, [place], PA  Vitamin D, Ergocalciferol, (DRISDOL) 1.25 MG (50000 UT) CAPS capsule Take 50,000 Units by mouth every 7 (seven) days.   Yes [provider]  Blood Glucose Monitoring Suppl (ONETOUCH VERIO) w/Device KIT 1 Device by Does not apply route 2 (two) times daily. E11.9 08/28/16   Susy Frizzle, MD  glucose blood test strip Check BS bid DX: E11.9 08/28/16   Susy Frizzle, MD  Insulin Syringe-Needle U-100 (SAFETY INSULIN SYRINGES) 30G X 5/16" 0.5 ML MISC As needed for insulin 06/11/17   Susy Frizzle, MD  isosorbide mononitrate (IMDUR) 60 MG 24 hr tablet Take 1 tablet (60 mg total) by mouth daily. Patient not taking: Reported on 05/03/2018 01/27/18 01/27/19  Erlene Quan, PA-C    Family History Family History  Problem Relation Age of Onset  . Leukemia Mother   . Cancer Mother   . Heart Problems Father   . Diabetes Sister        ? father  . Heart Problems Brother   . Heart Problems Other        family history  . Stroke Other   . Stroke Brother   . Hypertension Brother   . Cervical cancer Sister        ????  . Heart attack Neg Hx     Social History Social History   Tobacco Use  . Smoking status: Never Smoker  . Smokeless tobacco: Never Used  Substance Use Topics  . Alcohol use: Not Currently  . Drug use: Never     Allergies   Betadine [povidone iodine]; Contrast media [iodinated diagnostic agents]; and Fish allergy   Review of Systems Review of Systems  All systems reviewed and negative, other than as noted in HPI.  Physical Exam Updated Vital Signs BP 137/72   Pulse 66   Temp 98 F (36.7 C) (Oral)   Resp 16   SpO2 100%   Physical Exam Vitals signs and nursing note reviewed.  Constitutional:      General: He is not in acute distress.    Appearance: He is well-developed.  HENT:     Head: Normocephalic and atraumatic.  Eyes:      General:        Right eye: No discharge.  Left eye: No discharge.     Conjunctiva/sclera: Conjunctivae normal.  Neck:     Musculoskeletal: Neck supple.  Cardiovascular:     Rate and Rhythm: Normal rate and regular rhythm.     Heart sounds: Normal heart sounds. No murmur. No friction rub. No gallop.   Pulmonary:     Effort: Pulmonary effort is normal. No respiratory distress.     Breath sounds: Normal breath sounds.  Abdominal:     General: There is no distension.     Palpations: Abdomen is soft.     Tenderness: There is no abdominal tenderness.  Musculoskeletal:        General: No tenderness.  Skin:    General: Skin is warm and dry.  Neurological:     General: No focal deficit present.     Mental Status: He is alert and oriented to person, place, and time. Mental status is at baseline.     Cranial Nerves: No cranial nerve deficit.     Sensory: No sensory deficit.     Motor: No weakness.     Coordination: Coordination normal.  Psychiatric:        Behavior: Behavior normal.        Thought Content: Thought content normal.      ED Treatments / Results  Labs (all labs ordered are listed, but only abnormal results are displayed) Labs Reviewed  CBC - Abnormal; Notable for the following components:      Result Value   WBC 11.0 (*)    RBC 7.83 (*)    HCT 58.4 (*)    MCV 74.6 (*)    MCH 19.7 (*)    MCHC 26.4 (*)    RDW 23.9 (*)    Platelets 418 (*)    nRBC 0.4 (*)    All other components within normal limits  DIFFERENTIAL - Abnormal; Notable for the following components:   Neutro Abs 8.3 (*)    Abs Immature Granulocytes 0.11 (*)    All other components within normal limits  COMPREHENSIVE METABOLIC PANEL - Abnormal; Notable for the following components:   CO2 21 (*)    Glucose, Bld 152 (*)    BUN 27 (*)    Creatinine, Ser 2.01 (*)    Albumin 3.4 (*)    GFR calc non Af Amer 32 (*)    GFR calc Af Amer 38 (*)    All other components within normal limits   CBG MONITORING, ED - Abnormal; Notable for the following components:   Glucose-Capillary 148 (*)    All other components within normal limits  POCT I-STAT EG7 - Abnormal; Notable for the following components:   pCO2, Ven 43.5 (*)    Calcium, Ion 1.07 (*)    HCT 55.0 (*)    Hemoglobin 18.7 (*)    All other components within normal limits  TROPONIN I  I-STAT CREATININE, ED    EKG EKG Interpretation  Date/Time:  Monday May 03 2018 10:39:37 EDT Ventricular Rate:  69 PR Interval:  200 QRS Duration: 128 QT Interval:  426 QTC Calculation: 456 R Axis:   -48 Text Interpretation:  Normal sinus rhythm Possible Left atrial enlargement Left axis deviation Right bundle branch block Inferior infarct , age undetermined Abnormal ECG No significant change since last tracing Confirmed by Virgel Manifold [phone]) on 05/03/2018 12:37:01 PM   Radiology No results found.  Procedures Procedures (including critical care time)  Medications Ordered in ED Medications - No data to display   Initial  Impression / Assessment and Plan / ED Course  I have reviewed the triage vital signs and the nursing notes.  Pertinent labs & imaging results that were available during my care of the patient were reviewed by me and considered in my medical decision making (see chart for details).     72yM with tingling of entire face.  Now resolved.  No additional complaints.  Neuro exam is nonfocal. I doubt TIA, CVA, ACS, etc.   Final Clinical Impressions(s) / ED Diagnoses   Final diagnoses:  Tingling of face    ED Discharge Orders    None       Virgel Manifold, MD 05/14/18 1329

## 2018-05-17 ENCOUNTER — Encounter (HOSPITAL_COMMUNITY): Payer: HMO

## 2018-05-17 ENCOUNTER — Ambulatory Visit (HOSPITAL_COMMUNITY): Payer: Medicare HMO

## 2018-05-18 ENCOUNTER — Other Ambulatory Visit: Payer: Self-pay

## 2018-05-18 ENCOUNTER — Encounter (HOSPITAL_COMMUNITY): Payer: Self-pay | Admitting: *Deleted

## 2018-05-18 ENCOUNTER — Telehealth (HOSPITAL_COMMUNITY): Payer: Self-pay | Admitting: *Deleted

## 2018-05-18 DIAGNOSIS — Z9861 Coronary angioplasty status: Secondary | ICD-10-CM

## 2018-05-18 DIAGNOSIS — Z955 Presence of coronary angioplasty implant and graft: Secondary | ICD-10-CM

## 2018-05-18 NOTE — Progress Notes (Signed)
Cardiac Individual Treatment Plan  Patient Details  Name: James Mcclain MRN: [id number] Date of Birth: Aug 16, 1945 Referring Provider:     [place] from 03/25/2018 in [place]  Referring Provider  -- Kirk Ruths MD ]      Initial Encounter Date:    CARDIAC REHAB PHASE II ORIENTATION from 03/25/2018 in [place]  Date  03/25/18      Visit Diagnosis: S/P PTCA (percutaneous transluminal coronary angioplasty) 02/02/18 for ISR Ramus  Status post coronary artery stent placement 09/21/17  Patient's Home Medications on Admission:  Current Outpatient Medications:  .  acetaminophen (TYLENOL) 325 MG tablet, Take 2 tablets (650 mg total) by mouth every 4 (four) hours as needed for headache or mild pain., Disp: , Rfl:  .  albuterol (PROVENTIL HFA;VENTOLIN HFA) 108 (90 Base) MCG/ACT inhaler, Inhale 2 puffs into the lungs every 6 (six) hours as needed for wheezing or shortness of breath., Disp: 1 Inhaler, Rfl: 0 .  allopurinol (ZYLOPRIM) 100 MG tablet, TAKE 2 TABLETS BY MOUTH ONCE DAILY, Disp: 60 tablet, Rfl: 5 .  amLODipine (NORVASC) 10 MG tablet, Take 1 tablet (10 mg total) by mouth daily., Disp: 90 tablet, Rfl: 3 .  apixaban (ELIQUIS) 5 MG TABS tablet, Take 1 tablet (5 mg total) by mouth 2 (two) times daily., Disp: 180 tablet, Rfl: 3 .  aspirin EC 81 MG EC tablet, Take 1 tablet (81 mg total) by mouth daily. (Patient taking differently: Take 81 mg by mouth daily as needed for pain. ), Disp: , Rfl:  .  atorvastatin (LIPITOR) 80 MG tablet, Take 1 tablet (80 mg total) by mouth daily at 6 PM., Disp: 90 tablet, Rfl: 3 .  Blood Glucose Monitoring Suppl (ONETOUCH VERIO) w/Device KIT, 1 Device by Does not apply route 2 (two) times daily. E11.9, Disp: 1 kit, Rfl: 2 .  canagliflozin (INVOKANA) 100 MG TABS tablet, Take 100 mg by mouth daily before breakfast., Disp: , Rfl:  .  carvedilol (COREG) 25 MG tablet, Take 1  tablet (25 mg total) by mouth 2 (two) times daily., Disp: 180 tablet, Rfl: 3 .  clopidogrel (PLAVIX) 75 MG tablet, Take 1 tablet (75 mg total) by mouth daily with breakfast., Disp: 34 tablet, Rfl: 5 .  glucose blood test strip, Check BS bid DX: E11.9, Disp: 100 each, Rfl: 12 .  Insulin Glargine (LANTUS SOLOSTAR) 100 UNIT/ML Solostar Pen, Inject 45 Units into the skin daily., Disp: 15 mL, Rfl: 3 .  Insulin Syringe-Needle U-100 (SAFETY INSULIN SYRINGES) 30G X 5/16" 0.5 ML MISC, As needed for insulin, Disp: 100 each, Rfl: 11 .  isosorbide mononitrate (IMDUR) 30 MG 24 hr tablet, Take 30 mg by mouth daily., Disp: , Rfl:  .  isosorbide mononitrate (IMDUR) 60 MG 24 hr tablet, Take 1 tablet (60 mg total) by mouth daily. (Patient not taking: Reported on 05/03/2018), Disp: 30 tablet, Rfl: 2 .  JANUVIA 100 MG tablet, TAKE 1 TABLET(100 MG) BY MOUTH DAILY (Patient taking differently: Take 100 mg by mouth daily. ), Disp: 90 tablet, Rfl: 3 .  nitroGLYCERIN (NITROSTAT) 0.4 MG SL tablet, Place 1 tablet (0.4 mg total) under the tongue every 5 (five) minutes as needed for chest pain. Do not take more than 3 nitroglycerine, Disp: 25 tablet, Rfl: 3 .  Vitamin D, Ergocalciferol, (DRISDOL) 1.25 MG (50000 UT) CAPS capsule, Take 50,000 Units by mouth every 7 (seven) days., Disp: , Rfl:   Past Medical  History: Past Medical History:  Diagnosis Date  . Arthritis    "right hand" (09/21/2017)  . CHF (congestive heart failure) ([place])   . CKD (chronic kidney disease), stage III ([place])   . Contrast media allergy   . Coronary artery disease    a. s/p Xience DES to the LAD 07/2012. b. 09/21/17 Synergy stent to RI.   . Dilated aortic root ([place])    a. Mildly dilated by echo 09/2013, f/u MRA scheduled for 12/2014.  [name] Dyspnea   . Elevated PSA   . Essential hypertension   . Gout    "on daily RX" (09/21/2017)  . Heart murmur   . History of kidney stones   . Hyperlipidemia   . Insulin dependent diabetes mellitus ([place])   . Kidney  stones   . 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 On tele 09/2013.  [name] PAF (paroxysmal atrial fibrillation) ([place])   . Paroxysmal atrial fibrillation (H[facility])   . PFO (patent foramen ovale)    a. By echo 09/2013.  [name] Polycythemia   . Prostate cancer ([place]) 07/15/12   Gleason 7, external beam radiation Tx, Dr. Pilar Jarvis  . PVC's (premature ventricular contractions)   . 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 Stroke [facility])    a. Cryptogenic, 09/2013. Small PFO noted on echo but neg LE duplex. PAF eventually seen on monitor.  . Thoracic aortic aneurysm ([place])    a. 4.5cm by CT 01/2018.    Tobacco Use: Social History   Tobacco Use  Smoking Status Never Smoker  Smokeless Tobacco Never Used    Labs: Recent Review Flowsheet Data    Labs for ITP Cardiac and Pulmonary Rehab Latest Ref Rng & Units 01/13/2018 02/02/2018 02/02/2018 02/02/2018 05/03/2018   Cholestrol 100 - 199 mg/dL 104 - - - -   LDLCALC 0 - 99 mg/dL 55 - - - -   HDL >39 mg/dL 31(L) - - - -   Trlycerides 0 - 149 mg/dL 90 - - - -   Hemoglobin A1c 4.8 - 5.6 % - - - - -   PHART 7.350 - 7.450 - - 7.344(L) - -   PCO2ART 32.0 - 48.0 mmHg - - 39.8 - -   HCO3 20.0 - 28.0 mmol/L - 22.1 21.7 20.4 24.0   TCO2 22 - 32 mmol/L - _0 ACIDBASEDEF 0.0 - 2.0 mmol/L - 3.0(H) 4.0(H) 5.0(H) 2.0   O2SAT % - 77.0 97.0 76.0 75.0      Capillary Blood Glucose: Lab Results  Component Value Date   GLUCAP 148 (H) 05/03/2018   GLUCAP 142 (H) 05/03/2018   GLUCAP 133 (H) 04/23/2018   GLUCAP 106 (H) 04/16/2018   GLUCAP 134 (H) 04/07/2018     Exercise Target Goals: Exercise Program Goal: Individual exercise prescription set using results from initial 6 min walk test and THRR while considering  patient's activity barriers and safety.   Exercise Prescription Goal: Initial exercise prescription builds to 30-45 minutes a day of aerobic activity, 2-3 days per week.  Home exercise guidelines will be given to patient  during program as part of exercise prescription that the participant will acknowledge.  Activity Barriers & Risk Stratification: Activity Barriers & Cardiac Risk Stratification - 03/25/18 0957      Activity Barriers & Cardiac Risk Stratification   Activity Barriers  None    Cardiac Risk Stratification  Moderate       6 Minute Walk:  [place] Name 03/25/18 0956         6 Minute Walk   Phase  Initial     Distance  1480 feet     Walk Time  6 minutes     # of Rest Breaks  0     MPH  2.8     METS  2.73     RPE  11     Perceived Dyspnea   0     VO2 Peak  9.54     Symptoms  No     Resting HR  86 bpm     Resting BP  120/60     Resting Oxygen Saturation   97 %     Exercise Oxygen Saturation  during 6 min walk  98 %     Max Ex. HR  96 bpm     Max Ex. BP  126/64     2 Minute Post BP  122/62        Oxygen Initial Assessment:   Oxygen Re-Evaluation:   Oxygen Discharge (Final Oxygen Re-Evaluation):   Initial Exercise Prescription: Initial Exercise Prescription - 03/25/18 0900      Date of Initial Exercise RX and Referring Provider   Date  03/25/18    Referring Provider  --   Kirk Ruths MD    Expected Discharge Date  07/02/18      Bike   Level  0.7    Minutes  10    METs  2.2      NuStep   Level  2    SPM  75    Minutes  10    METs  2.5      Track   Laps  11    Minutes  10    METs  2.91      Prescription Details   Frequency (times per week)  3x    Duration  Progress to 30 minutes of continuous aerobic without signs/symptoms of physical distress      Intensity   THRR 40-80% of Max Heartrate  60-119    Ratings of Perceived Exertion  11-13    Perceived Dyspnea  0-4      Progression   Progression  Continue progressive overload as per policy without signs/symptoms or physical distress.      Resistance Training   Training Prescription  Yes    Weight  4lbs    Reps  10-15       Perform Capillary Blood Glucose checks as  needed.  Exercise Prescription Changes: Exercise Prescription Changes    Row Name 04/02/18 [phone] 04/05/18 0952 04/19/18 0951 05/12/18 0953       Response to Exercise   Blood Pressure (Admit)  114/60  108/62  122/62  126/70    Blood Pressure (Exercise)  138/78  140/78  142/82  108/72    Blood Pressure (Exit)  108/60  98/60  120/66  132/70    Heart Rate (Admit)  93 bpm  95 bpm  94 bpm  76 bpm    Heart Rate (Exercise)  113 bpm  113 bpm  121 bpm  97 bpm    Heart Rate (Exit)  85 bpm  95 bpm  91 bpm  83 bpm    Rating of Perceived Exertion (Exercise)  _0 Symptoms  none  none  none  none    Duration  Progress to 30 minutes of  aerobic without signs/symptoms of physical distress  Progress to 30 minutes of  aerobic without signs/symptoms of physical distress  Progress to 30 minutes of  aerobic without signs/symptoms of physical distress  Progress to 30 minutes of  aerobic without signs/symptoms of physical distress    Intensity  THRR unchanged  THRR unchanged  THRR unchanged  THRR unchanged      Progression   Progression  Continue to progress workloads to maintain intensity without signs/symptoms of physical distress.  Continue to progress workloads to maintain intensity without signs/symptoms of physical distress.  Continue to progress workloads to maintain intensity without signs/symptoms of physical distress.  Continue to progress workloads to maintain intensity without signs/symptoms of physical distress.    Average METs  2.2  3  3.5  3.8      Resistance Training   Training Prescription  Yes  Yes  Yes  Yes    Weight  4lbs  4lbs  2lbs  4lbs    Reps  10-15  10-15  10-15  10-15    Time  10 Minutes  10 Minutes  10 Minutes  10 Minutes      Interval Training   Interval Training  No  No  No  No      Bike   Level  0.7  1.4  1.4  -    Minutes  _0 -    METs  2.23  3.46  3.4  -      NuStep   Level  2  2  -  -    SPM  75  75  -  -    Minutes  10  10  -  -    METs  1.6   2.3  -  -      Arm Ergometer   Level  -  -  -  3    Minutes  -  -  -  10      Track   Laps  _1 32    Minutes  _2 METs  2.91  3.09  3.54  3.78      Home Exercise Plan   Plans to continue exercise at  -  Home (comment) Walking  Home (comment) Walking  Home (comment) Walking    Frequency  -  Add 2 additional days to program exercise sessions.  Add 2 additional days to program exercise sessions.  Add 2 additional days to program exercise sessions.    Initial Home Exercises Provided  -  04/05/18  04/05/18  04/05/18       Exercise Comments: Exercise Comments    Row Name 04/02/18 7062 04/05/18 1037 04/19/18 1020 05/03/18 1000     Exercise Comments  Patient tolerated first session of exercise well without difficulty.  Reviewed home exercise guidelines, METs, and goals with patient.  METs reviewed with patient.  No exercise today due to symptoms at start of first station and subsequent transport to ED. Unable to review METs and goals with patient.       Exercise Goals and Review: Exercise Goals    Row Name 03/25/18 0957             Exercise Goals   Increase Physical Activity  Yes       Intervention  Provide advice, education, support and counseling about physical activity/exercise needs.;Develop an individualized exercise prescription for aerobic and  resistive training based on initial evaluation findings, risk stratification, comorbidities and participant's personal goals.       Expected Outcomes  Short Term: Attend rehab on a regular basis to increase amount of physical activity.;Long Term: Add in home exercise to make exercise part of routine and to increase amount of physical activity.;Long Term: Exercising regularly at least 3-5 days a week.       Increase Strength and Stamina  Yes       Intervention  Provide advice, education, support and counseling about physical activity/exercise needs.;Develop an individualized exercise prescription for aerobic and  resistive training based on initial evaluation findings, risk stratification, comorbidities and participant's personal goals.       Expected Outcomes  Short Term: Increase workloads from initial exercise prescription for resistance, speed, and METs.;Short Term: Perform resistance training exercises routinely during rehab and add in resistance training at home;Long Term: Improve cardiorespiratory fitness, muscular endurance and strength as measured by increased METs and functional capacity (6MWT)       Able to understand and use rate of perceived exertion (RPE) scale  Yes       Intervention  Provide education and explanation on how to use RPE scale       Expected Outcomes  Short Term: Able to use RPE daily in rehab to express subjective intensity level;Long Term:  Able to use RPE to guide intensity level when exercising independently       Able to understand and use Dyspnea scale  Yes       Intervention  Provide education and explanation on how to use Dyspnea scale       Expected Outcomes  Short Term: Able to use Dyspnea scale daily in rehab to express subjective sense of shortness of breath during exertion;Long Term: Able to use Dyspnea scale to guide intensity level when exercising independently       Knowledge and understanding of Target Heart Rate Range (THRR)  Yes       Intervention  Provide education and explanation of THRR including how the numbers were predicted and where they are located for reference       Expected Outcomes  Short Term: Able to state/look up THRR;Short Term: Able to use daily as guideline for intensity in rehab;Long Term: Able to use THRR to govern intensity when exercising independently       Able to check pulse independently  Yes       Intervention  Provide education and demonstration on how to check pulse in carotid and radial arteries.;Review the importance of being able to check your own pulse for safety during independent exercise       Expected Outcomes  Short Term: Able  to explain why pulse checking is important during independent exercise;Long Term: Able to check pulse independently and accurately       Understanding of Exercise Prescription  Yes       Intervention  Provide education, explanation, and written materials on patient's individual exercise prescription       Expected Outcomes  Short Term: Able to explain program exercise prescription;Long Term: Able to explain home exercise prescription to exercise independently          Exercise Goals Re-Evaluation : Exercise Goals Re-Evaluation    Row Name 04/02/18 1037 04/05/18 1037 05/03/18 1000 05/12/18 1139       Exercise Goal Re-Evaluation   Exercise Goals Review  Increase Physical Activity;Able to understand and use rate of perceived exertion (RPE) scale  Increase Physical Activity;Able  to understand and use rate of perceived exertion (RPE) scale;Understanding of Exercise Prescription;Knowledge and understanding of Target Heart Rate Range (THRR);Able to check pulse independently;Increase Strength and Stamina  -  -    Comments  Patient able to understand and use RPE scale appropriately.  Reviewed home exercise guidelines with patient including THRR, RPE scale, and endpoints for exercise. Pt has a heart rate monitor to check his pulse. Pt plans to walk as his mode of home exercise.  Patient experienced tingling in face as he began first exercise station. Unable to review METs and goals with patient. Pt taken to ED.  Temporary department closure due to COVID-19.    Expected Outcomes  Increase workloads as tolerated to help increase and strength and stamina.  Pt will walk 30 minutes, 1-2 days/week at home in addition to exercise at cardiac rehab to help achieve personal health and fitness goals.  -  -       Discharge Exercise Prescription (Final Exercise Prescription Changes): Exercise Prescription Changes - 05/12/18 0953      Response to Exercise   Blood Pressure (Admit)  126/70    Blood Pressure (Exercise)   108/72    Blood Pressure (Exit)  132/70    Heart Rate (Admit)  76 bpm    Heart Rate (Exercise)  97 bpm    Heart Rate (Exit)  83 bpm    Rating of Perceived Exertion (Exercise)  13    Symptoms  none    Duration  Progress to 30 minutes of  aerobic without signs/symptoms of physical distress    Intensity  THRR unchanged      Progression   Progression  Continue to progress workloads to maintain intensity without signs/symptoms of physical distress.    Average METs  3.8      Resistance Training   Training Prescription  Yes    Weight  4lbs    Reps  10-15    Time  10 Minutes      Interval Training   Interval Training  No      Bike   Level  --    Minutes  --    METs  --      Arm Ergometer   Level  3    Minutes  10      Track   Laps  32    Minutes  20    METs  3.78      Home Exercise Plan   Plans to continue exercise at  Home (comment)   Walking   Frequency  Add 2 additional days to program exercise sessions.    Initial Home Exercises Provided  04/05/18       Nutrition:  Target Goals: Understanding of nutrition guidelines, daily intake of sodium <1572m, cholesterol <2058m calories 30% from fat and 7% or less from saturated fats, daily to have 5 or more servings of fruits and vegetables.  Biometrics: Pre Biometrics - 03/25/18 0957      Pre Biometrics   Height  _0  (1.778 m)    Weight  236 lb 12.4 oz (107.4 kg)    Waist Circumference  43 inches    Hip Circumference  46 inches    Waist to Hip Ratio  0.93 %    BMI (Calculated)  33.97    Triceps Skinfold  32 mm    % Body Fat  34.3 %    Grip Strength  45 kg    Flexibility  9 in  Single Leg Stand  4.81 seconds        Nutrition Therapy Plan and Nutrition Goals: Nutrition Therapy & Goals - 03/25/18 1044      Nutrition Therapy   Diet  heart healthy, carb modified       Personal Nutrition Goals   Nutrition Goal  Pt to identify and limit food sources of saturated fat, trans fat, refined carbohydrates and  sodium    Personal Goal #2  Pt to identify food quantities necessary to achieve weight loss of 6-24 lb at graduation from cardiac rehab.    Personal Goal #3  Pt to build a healthy plate including vegetables, fruits, whole grains, and low-fat dairy products in a heart healthy meal plan    Personal Goal #4  Pt to weigh and measure serving sizes      Intervention Plan   Intervention  Prescribe, educate and counsel regarding individualized specific dietary modifications aiming towards targeted core components such as weight, hypertension, lipid management, diabetes, heart failure and other comorbidities.    Expected Outcomes  Short Term Goal: Understand basic principles of dietary content, such as calories, fat, sodium, cholesterol and nutrients.;Long Term Goal: Adherence to prescribed nutrition plan.       Nutrition Assessments: Nutrition Assessments - 03/25/18 1007      MEDFICTS Scores   Pre Score  63       Nutrition Goals Re-Evaluation: Nutrition Goals Re-Evaluation    Row Name 03/25/18 1044             Goals   Current Weight  236 lb 12.4 oz (107.4 kg)          Nutrition Goals Re-Evaluation: Nutrition Goals Re-Evaluation    [place] Name 03/25/18 1044             Goals   Current Weight  236 lb 12.4 oz (107.4 kg)          Nutrition Goals Discharge (Final Nutrition Goals Re-Evaluation): Nutrition Goals Re-Evaluation - 03/25/18 1044      Goals   Current Weight  236 lb 12.4 oz (107.4 kg)       Psychosocial: Target Goals: Acknowledge presence or absence of significant depression and/or stress, maximize coping skills, provide positive support system. Participant is able to verbalize types and ability to use techniques and skills needed for reducing stress and depression.  Initial Review & Psychosocial Screening: Initial Psych Review & Screening - 03/25/18 0805      Initial Review   Current issues with  None Identified      Family Dynamics   Good Support System?  Yes    Markis lists his sisters as sources of support.      Barriers   Psychosocial barriers to participate in program  There are no identifiable barriers or psychosocial needs.      Screening Interventions   Interventions  Encouraged to exercise       Quality of Life Scores: Quality of Life - 03/25/18 0802      Quality of Life   Select  Quality of Life      Quality of Life Scores   Health/Function Pre  25.77 %    Socioeconomic Pre  27.5 %    Psych/Spiritual Pre  29.14 %    Family Pre  28.5 %    GLOBAL Pre  27.17 %      Scores of 19 and below usually indicate a poorer quality of life in these areas.  A difference of  2-3  points is a clinically meaningful difference.  A difference of 2-3 points in the total score of the Quality of Life Index has been associated with significant improvement in overall quality of life, self-image, physical symptoms, and general health in studies assessing change in quality of life.  PHQ-9: Recent Review Flowsheet Data    Depression screen [facility] 2/9 04/02/2018 02/10/2018 12/30/2016 12/16/2016 12/08/2016   Decreased Interest 0 0 0 0 0   Down, Depressed, Hopeless 0 0 0 0 0   PHQ - 2 Score 0 0 0 0 0     Interpretation of Total Score  Total Score Depression Severity:  1-4 = Minimal depression, 5-9 = Mild depression, 10-14 = Moderate depression, 15-19 = Moderately severe depression, 20-27 = Severe depression   Psychosocial Evaluation and Intervention:   Psychosocial Re-Evaluation: Psychosocial Re-Evaluation    [place] Name 04/02/18 1046 04/23/18 1611 05/13/18 1454         Psychosocial Re-Evaluation   Current issues with  None Identified  None Identified  None Identified     Comments  -  -  Unable to currently assess as exercise is currently on hold     Interventions  Encouraged to attend Cardiac Rehabilitation for the exercise  Encouraged to attend Cardiac Rehabilitation for the exercise  -     Continue Psychosocial Services   No Follow up required  No  Follow up required  -        Psychosocial Discharge (Final Psychosocial Re-Evaluation): Psychosocial Re-Evaluation - 05/13/18 1454      Psychosocial Re-Evaluation   Current issues with  None Identified    Comments  Unable to currently assess as exercise is currently on hold       Vocational Rehabilitation: Provide vocational rehab assistance to qualifying candidates.   Vocational Rehab Evaluation & Intervention: Vocational Rehab - 03/25/18 0802      Initial Vocational Rehab Evaluation & Intervention   Assessment shows need for Vocational Rehabilitation  No       Education: Education Goals: Education classes will be provided on a weekly basis, covering required topics. Participant will state understanding/return demonstration of topics presented.  Learning Barriers/Preferences: Learning Barriers/Preferences - 03/25/18 1005      Learning Barriers/Preferences   Learning Barriers  Sight    Learning Preferences  Skilled Demonstration;Verbal Instruction;Written Material       Education Topics: Count Your Pulse:  -Group instruction provided by verbal instruction, demonstration, patient participation and written materials to support subject.  Instructors address importance of being able to find your pulse and how to count your pulse when at home without a heart monitor.  Patients get hands on experience counting their pulse with staff help and individually.   Heart Attack, Angina, and Risk Factor Modification:  -Group instruction provided by verbal instruction, video, and written materials to support subject.  Instructors address signs and symptoms of angina and heart attacks.    Also discuss risk factors for heart disease and how to make changes to improve heart health risk factors.   Functional Fitness:  -Group instruction provided by verbal instruction, demonstration, patient participation, and written materials to support subject.  Instructors address safety measures for  doing things around the house.  Discuss how to get up and down off the floor, how to pick things up properly, how to safely get out of a chair without assistance, and balance training.   Meditation and Mindfulness:  -Group instruction provided by verbal instruction, patient participation, and written materials to support subject.  Instructor addresses importance of mindfulness and meditation practice to help reduce stress and improve awareness.  Instructor also leads participants through a meditation exercise.    Stretching for Flexibility and Mobility:  -Group instruction provided by verbal instruction, patient participation, and written materials to support subject.  Instructors lead participants through series of stretches that are designed to increase flexibility thus improving mobility.  These stretches are additional exercise for major muscle groups that are typically performed during regular warm up and cool down.   Hands Only CPR:  -Group verbal, video, and participation provides a basic overview of AHA guidelines for community CPR. Role-play of emergencies allow participants the opportunity to practice calling for help and chest compression technique with discussion of AED use.   Hypertension: -Group verbal and written instruction that provides a basic overview of hypertension including the most recent diagnostic guidelines, risk factor reduction with self-care instructions and medication management.    Nutrition I class: Heart Healthy Eating:  -Group instruction provided by PowerPoint slides, verbal discussion, and written materials to support subject matter. The instructor gives an explanation and review of the Therapeutic Lifestyle Changes diet recommendations, which includes a discussion on lipid goals, dietary fat, sodium, fiber, plant stanol/sterol esters, sugar, and the components of a well-balanced, healthy diet.   Nutrition II class: Lifestyle Skills:  -Group instruction  provided by PowerPoint slides, verbal discussion, and written materials to support subject matter. The instructor gives an explanation and review of label reading, grocery shopping for heart health, heart healthy recipe modifications, and ways to make healthier choices when eating out.   Diabetes Question & Answer:  -Group instruction provided by PowerPoint slides, verbal discussion, and written materials to support subject matter. The instructor gives an explanation and review of diabetes co-morbidities, pre- and post-prandial blood glucose goals, pre-exercise blood glucose goals, signs, symptoms, and treatment of hypoglycemia and hyperglycemia, and foot care basics.   Diabetes Blitz:  -Group instruction provided by PowerPoint slides, verbal discussion, and written materials to support subject matter. The instructor gives an explanation and review of the physiology behind type 1 and type 2 diabetes, diabetes medications and rational behind using different medications, pre- and post-prandial blood glucose recommendations and Hemoglobin A1c goals, diabetes diet, and exercise including blood glucose guidelines for exercising safely.    Portion Distortion:  -Group instruction provided by PowerPoint slides, verbal discussion, written materials, and food models to support subject matter. The instructor gives an explanation of serving size versus portion size, changes in portions sizes over the last 20 years, and what consists of a serving from each food group.   Stress Management:  -Group instruction provided by verbal instruction, video, and written materials to support subject matter.  Instructors review role of stress in heart disease and how to cope with stress positively.     Exercising on Your Own:  -Group instruction provided by verbal instruction, power point, and written materials to support subject.  Instructors discuss benefits of exercise, components of exercise, frequency and intensity of  exercise, and end points for exercise.  Also discuss use of nitroglycerin and activating EMS.  Review options of places to exercise outside of rehab.  Review guidelines for sex with heart disease.   Cardiac Drugs I:  -Group instruction provided by verbal instruction and written materials to support subject.  Instructor reviews cardiac drug classes: antiplatelets, anticoagulants, beta blockers, and statins.  Instructor discusses reasons, side effects, and lifestyle considerations for each drug class.   Cardiac Drugs II:  -Group instruction  provided by verbal instruction and written materials to support subject.  Instructor reviews cardiac drug classes: angiotensin converting enzyme inhibitors (ACE-I), angiotensin II receptor blockers (ARBs), nitrates, and calcium channel blockers.  Instructor discusses reasons, side effects, and lifestyle considerations for each drug class.   Anatomy and Physiology of the Circulatory System:  Group verbal and written instruction and models provide basic cardiac anatomy and physiology, with the coronary electrical and arterial systems. Review of: AMI, Angina, Valve disease, Heart Failure, Peripheral Artery Disease, Cardiac Arrhythmia, Pacemakers, and the ICD.   Other Education:  -Group or individual verbal, written, or video instructions that support the educational goals of the cardiac rehab program.   Holiday Eating Survival Tips:  -Group instruction provided by PowerPoint slides, verbal discussion, and written materials to support subject matter. The instructor gives patients tips, tricks, and techniques to help them not only survive but enjoy the holidays despite the onslaught of food that accompanies the holidays.   Knowledge Questionnaire Score: Knowledge Questionnaire Score - 03/25/18 0802      Knowledge Questionnaire Score   Pre Score  16/24       Core Components/Risk Factors/Patient Goals at Admission: Personal Goals and Risk Factors at  Admission - 03/25/18 1006      Core Components/Risk Factors/Patient Goals on Admission    Weight Management  Yes;Weight Loss;Obesity    Intervention  Weight Management: Develop a combined nutrition and exercise program designed to reach desired caloric intake, while maintaining appropriate intake of nutrient and fiber, sodium and fats, and appropriate energy expenditure required for the weight goal.;Weight Management: Provide education and appropriate resources to help participant work on and attain dietary goals.;Weight Management/Obesity: Establish reasonable short term and long term weight goals.;Obesity: Provide education and appropriate resources to help participant work on and attain dietary goals.    Admit Weight  236 lb 12.4 oz (107.4 kg)    Goal Weight: Long Term  230 lb (104.3 kg)    Expected Outcomes  Short Term: Continue to assess and modify interventions until short term weight is achieved;Long Term: Adherence to nutrition and physical activity/exercise program aimed toward attainment of established weight goal;Weight Loss: Understanding of general recommendations for a balanced deficit meal plan, which promotes 1-2 lb weight loss per week and includes a negative energy balance of [phone] kcal/d;Understanding recommendations for meals to include 15-35% energy as protein, 25-35% energy from fat, 35-60% energy from carbohydrates, less than 253m of dietary cholesterol, 20-35 gm of total fiber daily;Understanding of distribution of calorie intake throughout the day with the consumption of 4-5 meals/snacks    Diabetes  Yes    Intervention  Provide education about signs/symptoms and action to take for hypo/hyperglycemia.;Provide education about proper nutrition, including hydration, and aerobic/resistive exercise prescription along with prescribed medications to achieve blood glucose in normal ranges: Fasting glucose 65-99 mg/dL    Expected Outcomes  Short Term: Participant verbalizes  understanding of the signs/symptoms and immediate care of hyper/hypoglycemia, proper foot care and importance of medication, aerobic/resistive exercise and nutrition plan for blood glucose control.;Long Term: Attainment of HbA1C < 7%.    Hypertension  Yes    Intervention  Provide education on lifestyle modifcations including regular physical activity/exercise, weight management, moderate sodium restriction and increased consumption of fresh fruit, vegetables, and low fat dairy, alcohol moderation, and smoking cessation.;Monitor prescription use compliance.    Expected Outcomes  Short Term: Continued assessment and intervention until BP is < 140/927mHG in hypertensive participants. < 130/8064mG in hypertensive participants with diabetes,  heart failure or chronic kidney disease.;Long Term: Maintenance of blood pressure at goal levels.    Lipids  Yes    Intervention  Provide education and support for participant on nutrition & aerobic/resistive exercise along with prescribed medications to achieve LDL <46m, HDL >449m    Expected Outcomes  Short Term: Participant states understanding of desired cholesterol values and is compliant with medications prescribed. Participant is following exercise prescription and nutrition guidelines.;Long Term: Cholesterol controlled with medications as prescribed, with individualized exercise RX and with personalized nutrition plan. Value goals: LDL < 7033mHDL > 40 mg.       Core Components/Risk Factors/Patient Goals Review:  Goals and Risk Factor Review    Row Name 04/02/18 1047 04/23/18 1611 05/13/18 1454         Core Components/Risk Factors/Patient Goals Review   Personal Goals Review  Weight Management/Obesity;Diabetes;Hypertension;Lipids  Weight Management/Obesity;Diabetes;Hypertension;Lipids  Weight Management/Obesity;Diabetes;Hypertension;Lipids     Review  WayEdmararted cardiac rehab today  WayQuentins been doing well with exercise. WayJeanettes had no further  complaints of dizziness will continue to monitor   Exercise is currently on hold per recommended guidelines from the federal government to prevent the spread of COVID-19     Expected OutHarleighll participate in cardiac rehab for exercise, nutrition and lifestyle modifications  WayHomeroll participate in cardiac rehab for exercise, nutrition and lifestyle modifications  WayGatlynll participate in cardiac rehab for exercise, nutrition and lifestyle modifications once exercise resumes at cardiac rehab        Core Components/Risk Factors/Patient Goals at Discharge (Final Review):  Goals and Risk Factor Review - 05/13/18 1454      Core Components/Risk Factors/Patient Goals Review   Personal Goals Review  Weight Management/Obesity;Diabetes;Hypertension;Lipids    Review   Exercise is currently on hold per recommended guidelines from the federal government to prevent the spread of COVID-19    Expected OutEl Renoll participate in cardiac rehab for exercise, nutrition and lifestyle modifications once exercise resumes at cardiac rehab       ITP Comments: ITP Comments    RowNanty-Glome 03/25/18 1010 03/25/18 1114 04/02/18 1055 04/23/18 1609 05/13/18 1453   ITP Comments  Dr. TraFransico Himr. TraFransico Himedical Director  30 Day ITP comments. WayAquanarted cardiac rehab today.  30 Day ITP comments. Patient with good partcipation and attendance in phase 2 cardiac rehab  30 Day ITP comments.  Exercise is currently on hold per recommended guidelines from the federal government to prevent the spread of COVID-19      Comments: See ITP comments.MarBarnet PallN,BSN 05/18/2018 10:53 AM

## 2018-05-18 NOTE — Patient Outreach (Signed)
[place] [facility]) Care Management  05/18/2018  AMOGH KOMATSU Sep 02, 1945 [id number]  Referral date: 01/28/18 Referral source: primary MD  Referral reason: medication management Insurance: [place]   Telephone call to patient regarding assessment follow up. HIPAA verified with patient.  Patient reports he is doing well.  Patient reports he did not check his blood sugar this morning but recalls yesterday evening blood sugar being 127.  Patient states he continues to monitor his carbohydrate/ sugar consumption.  Patient denies any new symptoms or concerns.  Patient states he has communicated with [facility] care management pharmacist regarding his medications.  Patient states he is currently out of his Eliquis but will be picking up tomorrow.    PLAN:  Patient will be followed for ongoing case management by CSNP case manager.   [name] RN,BSN,CCM [facility] Telephonic  [phone]

## 2018-05-19 ENCOUNTER — Ambulatory Visit (HOSPITAL_COMMUNITY): Payer: Medicare HMO

## 2018-05-19 ENCOUNTER — Encounter (HOSPITAL_COMMUNITY): Payer: HMO

## 2018-05-19 NOTE — Patient Outreach (Addendum)
  [facility]) Care Management Chronic Special Needs Program  05/19/2018  Name: James Mcclain DOB: [birth year]  MRN: [id number]  [name] is enrolled in a chronic special needs plan for Heart Failure. Chronic Care Management Coordinator telephoned client to review health risk assessment and to develop individualized care plan.  Introduced the chronic care management program, importance of client participation, and taking their care plan to all provider appointments and inpatient facilities.  Reviewed the transition of care process and possible referral to community care management.  Subjective: client reports history of heart failure, diabetes, hypertension, atrial fibrillation. Client reports he has not been weighing self and does not have scales. Mr. Sawchuk reports he is not able to afford scales at this time. He reports he is taking Elliquis and that is has become too expensive.   Goals Addressed            This Visit's Progress   .  Acknowledge receipt of Programme researcher, broadcasting/film/video      . Advanced Care Planning complete as directed by client within the next 6 months.      . Client understands the importance of follow-up with providers by attending scheduled visits      . Client verbalize knowledge of Heart Failure diease self management skills withn the next 63months.      . Client will report no worsening of symptoms of Atrial Fibrillation within the next 6 months.      . Client will report no worsening of symptoms related to heart disease within the next 6 months.      . Client will report weighing and recording weights daily within 6 months.      . Client will verbalize knowledge of diabetes self-management as evidenced by Hgb A1C <7 or as defined by provider.       Diabetes self management actions:  Glucose monitoring per provider recommendations  Perform Quality checks on blood meter  Eat Healthy  Check feet daily  Visit provider every 3-6 months as  directed  Hbg A1C level every 3-6 months.  Eye Exam yearly    . Client will verbalize knowledge of self management of Hypertension as evidences by BP reading of 140/90 or less; or as defined by provider      . Maintain timely refills of Heart Failure medication as prescribed within the year       . Obtain annual  Lipid Profile, LDL-C      . Visit Primary Care Provider or Cardiologist at least 2 times per year        [facility] reinforced the importance of why it is important to weigh and record weights. RNCM also reforced importance of taking anticoagulant daily. Client states he is going to follow up with his cardiologist about cost of the medication.   Plan:  Send successful outreach letter with a copy of their individualized care plan, Send individual care plan to provider and Send educational material Chronic care management coordination will outreach in: within 6 months. [facility] Pharmacist currently involved. RNCM will update [facility] Pharmacist, Lottie Dawson. Provide scales.  Thea Silversmith, RN, MSN, [place] [phone]

## 2018-05-21 ENCOUNTER — Encounter (HOSPITAL_COMMUNITY): Payer: HMO

## 2018-05-21 ENCOUNTER — Ambulatory Visit (HOSPITAL_COMMUNITY): Payer: Medicare HMO

## 2018-05-24 ENCOUNTER — Encounter (HOSPITAL_COMMUNITY): Payer: HMO

## 2018-05-24 ENCOUNTER — Ambulatory Visit: Payer: Self-pay | Admitting: Pharmacist

## 2018-05-24 ENCOUNTER — Ambulatory Visit (HOSPITAL_COMMUNITY): Payer: Medicare HMO

## 2018-05-26 ENCOUNTER — Ambulatory Visit (HOSPITAL_COMMUNITY): Payer: Medicare HMO

## 2018-05-26 ENCOUNTER — Encounter (HOSPITAL_COMMUNITY): Payer: HMO

## 2018-05-26 DIAGNOSIS — R519 Headache, unspecified: Secondary | ICD-10-CM | POA: Diagnosis not present

## 2018-05-26 DIAGNOSIS — J90 Pleural effusion, not elsewhere classified: Secondary | ICD-10-CM | POA: Diagnosis not present

## 2018-05-26 DIAGNOSIS — N3000 Acute cystitis without hematuria: Secondary | ICD-10-CM | POA: Diagnosis not present

## 2018-05-26 DIAGNOSIS — N3281 Overactive bladder: Secondary | ICD-10-CM | POA: Diagnosis not present

## 2018-05-26 DIAGNOSIS — Z8673 Personal history of transient ischemic attack (TIA), and cerebral infarction without residual deficits: Secondary | ICD-10-CM | POA: Diagnosis not present

## 2018-05-26 DIAGNOSIS — I10 Essential (primary) hypertension: Secondary | ICD-10-CM | POA: Diagnosis not present

## 2018-05-26 DIAGNOSIS — N390 Urinary tract infection, site not specified: Secondary | ICD-10-CM | POA: Diagnosis not present

## 2018-05-26 DIAGNOSIS — E119 Type 2 diabetes mellitus without complications: Secondary | ICD-10-CM | POA: Diagnosis not present

## 2018-05-26 DIAGNOSIS — N183 Chronic kidney disease, stage 3 (moderate): Secondary | ICD-10-CM | POA: Diagnosis not present

## 2018-05-26 DIAGNOSIS — J3089 Other allergic rhinitis: Secondary | ICD-10-CM | POA: Diagnosis not present

## 2018-05-26 DIAGNOSIS — R32 Unspecified urinary incontinence: Secondary | ICD-10-CM | POA: Diagnosis not present

## 2018-05-28 ENCOUNTER — Encounter (HOSPITAL_COMMUNITY): Payer: HMO

## 2018-05-28 ENCOUNTER — Ambulatory Visit (HOSPITAL_COMMUNITY): Payer: Medicare HMO

## 2018-05-31 ENCOUNTER — Encounter (HOSPITAL_COMMUNITY): Payer: HMO

## 2018-05-31 ENCOUNTER — Ambulatory Visit (HOSPITAL_COMMUNITY): Payer: Medicare HMO

## 2018-06-01 ENCOUNTER — Other Ambulatory Visit: Payer: Self-pay

## 2018-06-01 NOTE — Patient Outreach (Signed)
  [place] [facility] [facility]) Care Management Chronic Special Needs Program    06/01/2018  Name: James Mcclain, DOB: 08/25/1945  MRN: [id number]   [name] is enrolled in a chronic special needs plan. Client is engaged in [employer] for community care. COVID-19 precautions reviewed per [facility].  Thea Silversmith, RN, MSN, [place] [phone]

## 2018-06-02 ENCOUNTER — Telehealth (HOSPITAL_COMMUNITY): Payer: Self-pay | Admitting: *Deleted

## 2018-06-02 ENCOUNTER — Ambulatory Visit (HOSPITAL_COMMUNITY): Payer: Medicare HMO

## 2018-06-02 ENCOUNTER — Encounter (HOSPITAL_COMMUNITY): Payer: HMO

## 2018-06-02 NOTE — Telephone Encounter (Signed)
Called to notify patient that the cardiac and pulmonary rehabilitation department will be closed temporarily due to COVID-19 restrictions. Pt verbalized understanding.  Walking some, but not a lot at home. Encouraged patient to walk at least 3-4 days/week as able to maintain strength and stamina, and pt is agreeable to this.  Sol Passer, MS, ACSM CEP 06/02/2018 1026

## 2018-06-03 ENCOUNTER — Telehealth: Payer: Self-pay | Admitting: Oncology

## 2018-06-03 NOTE — Telephone Encounter (Signed)
Per 4/9 los moved 4/29 appointment to July. Left message for patient. Schedule mailed.

## 2018-06-04 ENCOUNTER — Encounter (HOSPITAL_COMMUNITY): Payer: HMO

## 2018-06-04 ENCOUNTER — Ambulatory Visit (HOSPITAL_COMMUNITY): Payer: Medicare HMO

## 2018-06-07 ENCOUNTER — Ambulatory Visit (HOSPITAL_COMMUNITY): Payer: Medicare HMO

## 2018-06-07 ENCOUNTER — Encounter (HOSPITAL_COMMUNITY): Payer: HMO

## 2018-06-09 ENCOUNTER — Ambulatory Visit (HOSPITAL_COMMUNITY): Payer: Medicare HMO

## 2018-06-09 ENCOUNTER — Encounter (HOSPITAL_COMMUNITY): Payer: HMO

## 2018-06-11 ENCOUNTER — Encounter (HOSPITAL_COMMUNITY): Payer: HMO

## 2018-06-11 ENCOUNTER — Other Ambulatory Visit: Payer: Self-pay | Admitting: Cardiology

## 2018-06-11 ENCOUNTER — Ambulatory Visit (HOSPITAL_COMMUNITY): Payer: Medicare HMO

## 2018-06-11 NOTE — Telephone Encounter (Signed)
This is Dr. Crenshaw's pt 

## 2018-06-11 NOTE — Telephone Encounter (Signed)
Carvedilol 25 mg refilled. 

## 2018-06-14 ENCOUNTER — Encounter (HOSPITAL_COMMUNITY): Payer: HMO

## 2018-06-14 ENCOUNTER — Ambulatory Visit (HOSPITAL_COMMUNITY): Payer: Medicare HMO

## 2018-06-16 ENCOUNTER — Ambulatory Visit (HOSPITAL_COMMUNITY): Payer: Medicare HMO

## 2018-06-16 ENCOUNTER — Encounter (HOSPITAL_COMMUNITY): Payer: HMO

## 2018-06-18 ENCOUNTER — Encounter (HOSPITAL_COMMUNITY): Payer: HMO

## 2018-06-18 ENCOUNTER — Ambulatory Visit (HOSPITAL_COMMUNITY): Payer: Medicare HMO

## 2018-06-21 ENCOUNTER — Encounter (HOSPITAL_COMMUNITY): Payer: HMO

## 2018-06-21 ENCOUNTER — Ambulatory Visit (HOSPITAL_COMMUNITY): Payer: Medicare HMO

## 2018-06-23 ENCOUNTER — Encounter (HOSPITAL_COMMUNITY): Payer: HMO

## 2018-06-23 ENCOUNTER — Ambulatory Visit: Payer: Medicare HMO | Admitting: Oncology

## 2018-06-23 ENCOUNTER — Other Ambulatory Visit: Payer: Medicare HMO

## 2018-06-23 ENCOUNTER — Ambulatory Visit (HOSPITAL_COMMUNITY): Payer: Medicare HMO

## 2018-06-25 ENCOUNTER — Encounter (HOSPITAL_COMMUNITY): Payer: HMO

## 2018-06-25 ENCOUNTER — Ambulatory Visit (HOSPITAL_COMMUNITY): Payer: Medicare HMO

## 2018-06-25 DIAGNOSIS — N183 Chronic kidney disease, stage 3 (moderate): Secondary | ICD-10-CM | POA: Diagnosis not present

## 2018-06-25 DIAGNOSIS — Z7901 Long term (current) use of anticoagulants: Secondary | ICD-10-CM | POA: Diagnosis not present

## 2018-06-25 DIAGNOSIS — E119 Type 2 diabetes mellitus without complications: Secondary | ICD-10-CM | POA: Diagnosis not present

## 2018-06-25 DIAGNOSIS — I10 Essential (primary) hypertension: Secondary | ICD-10-CM | POA: Diagnosis not present

## 2018-06-28 ENCOUNTER — Encounter (HOSPITAL_COMMUNITY): Payer: HMO

## 2018-06-28 ENCOUNTER — Ambulatory Visit (HOSPITAL_COMMUNITY): Payer: Medicare HMO

## 2018-06-30 ENCOUNTER — Encounter (HOSPITAL_COMMUNITY): Payer: HMO

## 2018-06-30 ENCOUNTER — Ambulatory Visit (HOSPITAL_COMMUNITY): Payer: Medicare HMO

## 2018-07-02 ENCOUNTER — Ambulatory Visit (HOSPITAL_COMMUNITY): Payer: Medicare HMO

## 2018-07-02 ENCOUNTER — Other Ambulatory Visit: Payer: Self-pay | Admitting: Cardiology

## 2018-07-02 ENCOUNTER — Encounter (HOSPITAL_COMMUNITY): Payer: HMO

## 2018-07-05 ENCOUNTER — Ambulatory Visit (HOSPITAL_COMMUNITY): Payer: Medicare HMO

## 2018-07-05 ENCOUNTER — Encounter (HOSPITAL_COMMUNITY): Payer: HMO

## 2018-07-07 ENCOUNTER — Telehealth: Payer: Self-pay

## 2018-07-07 NOTE — Telephone Encounter (Signed)
I spoke with Dr.SEthi about pt schedule for in office visit on Tuesday. I stated he has a score of 8 for covid 19 risk factors. 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 If the pt is not having new symptoms it can be a video or schedule out. If he is having new symptoms from the old stroke pt can be seen. But he stated pt needs to know he is f he still wants to come in the office  to educate him that he considered high risk if he contracts the virus that is the reason we would rather do virtual. James Mcclain was made aware of this and will call the referring office to get more information and let them know.

## 2018-07-08 ENCOUNTER — Other Ambulatory Visit: Payer: Self-pay | Admitting: Pharmacist

## 2018-07-08 NOTE — Patient Outreach (Signed)
[place] [facility]) Care Management [place]  07/08/2018  RACHAEL ZAPANTA February 18, 1946 [id number]  [name] pharmacy case is being closed due to the following reasons:  The patient is participating in another care management program.  Patient is engaged with [facility].  [place] to sign off per protocol.    Thank you for allowing [facility] pharmacy to be involved in this patient's care.    Regina Eck, PharmD, [place] [phone]

## 2018-07-13 ENCOUNTER — Ambulatory Visit: Payer: Medicare HMO | Admitting: Neurology

## 2018-07-13 ENCOUNTER — Ambulatory Visit: Payer: HMO | Admitting: Pharmacist

## 2018-07-16 ENCOUNTER — Other Ambulatory Visit: Payer: Self-pay | Admitting: Physician Assistant

## 2018-07-20 ENCOUNTER — Telehealth (HOSPITAL_COMMUNITY): Payer: Self-pay | Admitting: *Deleted

## 2018-07-27 DIAGNOSIS — E7800 Pure hypercholesterolemia, unspecified: Secondary | ICD-10-CM | POA: Diagnosis not present

## 2018-07-27 DIAGNOSIS — Z1159 Encounter for screening for other viral diseases: Secondary | ICD-10-CM | POA: Diagnosis not present

## 2018-07-27 DIAGNOSIS — N183 Chronic kidney disease, stage 3 (moderate): Secondary | ICD-10-CM | POA: Diagnosis not present

## 2018-07-27 DIAGNOSIS — R5383 Other fatigue: Secondary | ICD-10-CM | POA: Diagnosis not present

## 2018-07-27 DIAGNOSIS — E559 Vitamin D deficiency, unspecified: Secondary | ICD-10-CM | POA: Diagnosis not present

## 2018-07-27 DIAGNOSIS — E119 Type 2 diabetes mellitus without complications: Secondary | ICD-10-CM | POA: Diagnosis not present

## 2018-07-27 DIAGNOSIS — I10 Essential (primary) hypertension: Secondary | ICD-10-CM | POA: Diagnosis not present

## 2018-08-02 DIAGNOSIS — E119 Type 2 diabetes mellitus without complications: Secondary | ICD-10-CM | POA: Diagnosis not present

## 2018-08-02 DIAGNOSIS — Z01818 Encounter for other preprocedural examination: Secondary | ICD-10-CM | POA: Diagnosis not present

## 2018-08-02 DIAGNOSIS — H25811 Combined forms of age-related cataract, right eye: Secondary | ICD-10-CM | POA: Diagnosis not present

## 2018-08-02 DIAGNOSIS — H25812 Combined forms of age-related cataract, left eye: Secondary | ICD-10-CM | POA: Diagnosis not present

## 2018-08-03 ENCOUNTER — Telehealth: Payer: Self-pay

## 2018-08-03 NOTE — Telephone Encounter (Signed)
   [facility] Pre-operative Risk Assessment    Request for surgical clearance:  1. What type of surgery is being performed? cataract extraction   2. When is this surgery scheduled? schedule pending   3. What type of clearance is required (medical clearance vs. Pharmacy clearance to hold med vs. Both)? medical clearance  4. Are there any medications that need to be held prior to surgery and how long? n/a   5. Practice name and name of physician performing surgery? [facility]  Dr Julian Reil   6. What is your office phone number? [phone] ext [phone] (physician assistant dept)    7.   What is your office fax number? [phone]  8.   Anesthesia type (None, local, MAC, general) ? IV sedation   Aprile Dickenson, [place] 08/03/2018, 4:09 PM

## 2018-08-04 DIAGNOSIS — N183 Chronic kidney disease, stage 3 (moderate): Secondary | ICD-10-CM | POA: Diagnosis not present

## 2018-08-04 DIAGNOSIS — E1122 Type 2 diabetes mellitus with diabetic chronic kidney disease: Secondary | ICD-10-CM | POA: Diagnosis not present

## 2018-08-04 DIAGNOSIS — I129 Hypertensive chronic kidney disease with stage 1 through stage 4 chronic kidney disease, or unspecified chronic kidney disease: Secondary | ICD-10-CM | POA: Diagnosis not present

## 2018-08-04 DIAGNOSIS — R809 Proteinuria, unspecified: Secondary | ICD-10-CM | POA: Diagnosis not present

## 2018-08-04 NOTE — Telephone Encounter (Signed)
   Primary Cardiologist: Kirk Ruths, MD  Chart reviewed as part of pre-operative protocol coverage. Cataract extractions are recognized in guidelines as low risk surgeries that do not typically require specific preoperative testing or holding of blood thinner therapy. Therefore, given past medical history and time since last visit, based on ACC/AHA guidelines, James Mcclain would be at acceptable risk for the planned procedure without further cardiovascular testing.   From a medication standpoint, he needs to remain on Plavix without interruption, given placement of a drug eluting coronary stent in Dec. 2019.  I will route this recommendation to the requesting party via Epic fax function and remove from pre-op pool.  Please call with questions.  Lyda Jester, PA-C 08/04/2018, 8:15 AM

## 2018-08-06 DIAGNOSIS — E291 Testicular hypofunction: Secondary | ICD-10-CM | POA: Diagnosis not present

## 2018-08-18 ENCOUNTER — Other Ambulatory Visit: Payer: Self-pay

## 2018-08-18 NOTE — Patient Outreach (Signed)
  [place] [facility]) Care Management Chronic Special Needs Program    08/18/2018  Name: James Mcclain, DOB: 04/14/1945  MRN: [id number]   [name] is enrolled in a chronic special needs plan. Client is assigned [facility] Case Management.   Per [facility] note: client engaged on 05/12/2018 Tele-medicine: COVID-19 education completed; working with client to obtain medication assistance with Eliquis.  Next scheduled visit with [facility], NP is 09/03/2018  Plan: continue care coordination with [facility] Case Management.  Thea Silversmith, RN, MSN, [place] [phone]

## 2018-08-19 ENCOUNTER — Telehealth (HOSPITAL_COMMUNITY): Payer: Self-pay | Admitting: *Deleted

## 2018-08-19 ENCOUNTER — Encounter (HOSPITAL_COMMUNITY): Payer: Self-pay | Admitting: *Deleted

## 2018-08-19 DIAGNOSIS — Z955 Presence of coronary angioplasty implant and graft: Secondary | ICD-10-CM

## 2018-08-19 DIAGNOSIS — Z9861 Coronary angioplasty status: Secondary | ICD-10-CM

## 2018-08-19 NOTE — Telephone Encounter (Signed)
Spoke with [place] regarding phase 2 cardiac rehab due to the continued COVID 19 Pandemic. Zaydyn would like to be discharged from the program. Sanders says that he is doing some walking around his home.Barnet Pall, RN,BSN 08/19/2018 12:20 PM

## 2018-08-19 NOTE — Progress Notes (Signed)
Discharge Progress Report  Patient Details  Name: James Mcclain MRN: [id number] Date of Birth: [birth year] Referring Provider:     [place] from 03/25/2018 in [place]  Referring Provider  -- Kirk Ruths MD ]       Number of Visits: 44  Reason for Discharge:  Early Exit:  Cardiac rehab closed due to the [place] 19 Pandemic. Patient is now exercising on his own  Smoking History:  Social History   Tobacco Use  Smoking Status Never Smoker  Smokeless Tobacco Never Used    Diagnosis:  S/P PTCA (percutaneous transluminal coronary angioplasty) 02/02/18 for ISR [place]  Status post coronary artery stent placement 09/21/17  ADL UCSD:   Initial Exercise Prescription:   Discharge Exercise Prescription (Final Exercise Prescription Changes):   Functional Capacity:   Psychological, QOL, Others - Outcomes: PHQ 2/9: Depression screen [facility] 2/9 05/18/2018 04/02/2018 02/10/2018 12/30/2016 12/16/2016  Decreased Interest 0 0 0 0 0  Down, Depressed, Hopeless 0 0 0 0 0  PHQ - 2 Score 0 0 0 0 0  Some recent data might be hidden    Quality of Life:   Personal Goals: Goals established at orientation with interventions provided to work toward goal.    Personal Goals Discharge:   Exercise Goals and Review:   Exercise Goals Re-Evaluation:   Nutrition & Weight - Outcomes:    Nutrition:   Nutrition Discharge:   Education Questionnaire Score:   [place] participated in 11 exercise sessions between 03/25/18-04/30/18. Cardiac rehab has been closed since March due to the [place] 19 Pandemic. [name] is doing some walking at home and has decided not to return to cardiac rehab. [name] does not have a smart phone and can not participate in virtual cardiac rehab.Barnet Pall, RN,BSN 08/19/2018 2:18 PM

## 2018-08-23 ENCOUNTER — Telehealth: Payer: Self-pay | Admitting: Oncology

## 2018-08-23 NOTE — Telephone Encounter (Signed)
FS PAL moved 7/7 appointments to 8/4. Confirmed with patient. Per patient schedule mailed.

## 2018-08-26 NOTE — Progress Notes (Signed)
Virtual Visit via Video Note changed to phone visit at pt request   This visit type was conducted due to national recommendations for restrictions regarding the COVID-19 Pandemic (e.g. social distancing) in an effort to limit this patient's exposure and mitigate transmission in our community.  Due to his co-morbid illnesses, this patient is at least at moderate risk for complications without adequate follow up.  This format is felt to be most appropriate for this patient at this time.  All issues noted in this document were discussed and addressed.  A limited physical exam was performed with this format.  Please refer to the patient's chart for his consent to telehealth for [facility].   Date:  08/31/2018   ID:  James Mcclain, DOB 1945-05-18, MRN [id number]  Patient Location:Home Provider Location: Home  PCP:  [name], FNP  Cardiologist:  Dr Stanford Breed  Evaluation Performed:  Follow-Up Visit  Chief Complaint:  FU CAD  History of Present Illness:   FU PAF and CAD. Patient had a cryptogenic stroke in 2015 treated with tPAand afuloop monitor showed paroxysmal atrial fibrillation. Had PCI of LAD 2014.  Also with PFO documented by prior TEE.Last echo 7/19 showed normal LV function, trace AI, mild LAE. Had PCI of RI with DES 7/19. Cardiac catheterization December 2019 showed 85% ostial ramus which was treated with balloon angioplasty, 40% LAD, 65% sidebranch and ostial first diagonal, 40% circumflex and 40% right coronary artery.  Chest CT December 2019 showed 4.5 cm ascending thoracic aortic aneurysm.  Since last seen the patient denies any dyspnea on exertion, orthopnea, PND, pedal edema, palpitations, syncope or chest pain.   The patient does not have symptoms concerning for COVID-19 infection (fever, chills, cough, or new shortness of breath).    Past Medical History:  Diagnosis Date  . Arthritis    "right hand" (09/21/2017)  . CHF (congestive heart failure) ([place])   . CKD  (chronic kidney disease), stage III ([place])   . Contrast media allergy   . Coronary artery disease    a. s/p Xience DES to the LAD 07/2012. b. 09/21/17 Synergy stent to RI.   . Dilated aortic root ([place])    a. Mildly dilated by echo 09/2013, f/u MRA scheduled for 12/2014.  [name] Dyspnea   . Elevated PSA   . Essential hypertension   . Gout    "on daily RX" (09/21/2017)  . Heart murmur   . History of kidney stones   . Hyperlipidemia   . Insulin dependent diabetes mellitus ([place])   . Kidney stones   . 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 On tele 09/2013.  [name] PAF (paroxysmal atrial fibrillation) ([place])   . Paroxysmal atrial fibrillation (HCC)   . PFO (patent foramen ovale)    a. By echo 09/2013.  [name] Polycythemia   . Prostate cancer ([place]) 07/15/12   Gleason 7, external beam radiation Tx, Dr. Pilar Jarvis  . PVC's (premature ventricular contractions)   . 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 Stroke [facility])    a. Cryptogenic, 09/2013. Small PFO noted on echo but neg LE duplex. PAF eventually seen on monitor.  . Thoracic aortic aneurysm ([place])    a. 4.5cm by CT 01/2018.   Past Surgical History:  Procedure Laterality Date  . COLONOSCOPY WITH PROPOFOL N/A 02/22/2016   Procedure: COLONOSCOPY WITH PROPOFOL;  Surgeon: Carol Ada, MD;  Location: [facility];  Service: Endoscopy;  Laterality: N/A;  . CORONARY ANGIOPLASTY  WITH STENT PLACEMENT  08/05/2012   LAD, 1 stent  . CORONARY BALLOON ANGIOPLASTY N/A 02/02/2018   Procedure: CORONARY BALLOON ANGIOPLASTY;  Surgeon: Leonie Man, MD;  Location: [place] CV LAB;  Service: Cardiovascular;  Laterality: N/A;  . CORONARY STENT INTERVENTION N/A 09/21/2017   Procedure: CORONARY STENT INTERVENTION;  Surgeon: Belva Crome, MD;  Location: [place] CV LAB;  Service: Cardiovascular;  Laterality: N/A;  . EP IMPLANTABLE DEVICE N/A 08/23/2014   Procedure: Loop Recorder Insertion;  Surgeon: Evans Lance, MD;  Location: [place]  CV LAB;  Service: Cardiovascular;  Laterality: N/A;  . INSERTION PROSTATE RADIATION SEED    . JOINT REPLACEMENT     left knee replacement   . LEFT HEART CATH AND CORONARY ANGIOGRAPHY N/A 09/21/2017   Procedure: LEFT HEART CATH AND CORONARY ANGIOGRAPHY;  Surgeon: Belva Crome, MD;  Location: [place] CV LAB;  Service: Cardiovascular;  Laterality: N/A;  . LITHOTRIPSY  2006  . PERCUTANEOUS CORONARY STENT INTERVENTION (PCI-S) N/A 08/05/2012   Procedure: PERCUTANEOUS CORONARY STENT INTERVENTION (PCI-S);  Surgeon: Sherren Mocha, MD;  Location: [facility] CATH LAB;  Service: Cardiovascular;  Laterality: N/A;  . PROSTATE BIOPSY  07/15/2012; 09/23/2013  . RIGHT/LEFT HEART CATH AND CORONARY ANGIOGRAPHY N/A 02/02/2018   Procedure: RIGHT/LEFT HEART CATH AND CORONARY ANGIOGRAPHY;  Surgeon: Leonie Man, MD;  Location: [place] CV LAB;  Service: Cardiovascular;  Laterality: N/A;  . TEE WITHOUT CARDIOVERSION N/A 09/27/2013   Procedure: TRANSESOPHAGEAL ECHOCARDIOGRAM (TEE);  Surgeon: Larey Dresser, MD;  Location: [place];  Service: Cardiovascular;  Laterality: N/A;  . TONSILLECTOMY  1990s  . TOTAL KNEE ARTHROPLASTY  01/24/2011   Procedure: TOTAL KNEE ARTHROPLASTY;  Surgeon: Alta Corning;  Location: [place];  Service: Orthopedics;  Laterality: Left;  COMPUTER ASSISTED LEFT  TOTAL KNEE REPLACEMENT. Anesthesia a combination of regional and general.  . ULTRASOUND GUIDANCE FOR VASCULAR ACCESS  02/02/2018   Procedure: Ultrasound Guidance For Vascular Access;  Surgeon: Leonie Man, MD;  Location: [place] CV LAB;  Service: Cardiovascular;;     Current Meds  Medication Sig  . acetaminophen (TYLENOL) 325 MG tablet Take 2 tablets (650 mg total) by mouth every 4 (four) hours as needed for headache or mild pain.  [name] allopurinol (ZYLOPRIM) 100 MG tablet TAKE 2 TABLETS BY MOUTH ONCE DAILY  . amLODipine (NORVASC) 10 MG tablet Take 1 tablet (10 mg total) by mouth daily.  [name] apixaban (ELIQUIS) 5 MG TABS tablet  Take 1 tablet (5 mg total) by mouth 2 (two) times daily.  [name] aspirin EC 81 MG EC tablet Take 1 tablet (81 mg total) by mouth daily. (Patient taking differently: Take 81 mg by mouth daily as needed for pain. )  . atorvastatin (LIPITOR) 80 MG tablet Take 1 tablet (80 mg total) by mouth daily at 6 PM.  . Blood Glucose Monitoring Suppl (ONETOUCH VERIO) w/Device KIT 1 Device by Does not apply route 2 (two) times daily. E11.9  . canagliflozin (INVOKANA) 100 MG TABS tablet Take 100 mg by mouth daily before breakfast.  . carvedilol (COREG) 25 MG tablet TAKE 1 TABLET(25 MG) BY MOUTH TWICE DAILY  . clopidogrel (PLAVIX) 75 MG tablet TAKE 1 TABLET(75 MG) BY MOUTH DAILY WITH BREAKFAST  . glucose blood test strip Check BS bid DX: E11.9  . Insulin Glargine (LANTUS SOLOSTAR) 100 UNIT/ML Solostar Pen Inject 45 Units into the skin daily.  . Insulin Syringe-Needle U-100 (SAFETY INSULIN SYRINGES) 30G X 5/16" 0.5 ML MISC As  needed for insulin  . isosorbide mononitrate (IMDUR) 60 MG 24 hr tablet Take 1 tablet (60 mg total) by mouth daily.  [name] JANUVIA 100 MG tablet TAKE 1 TABLET(100 MG) BY MOUTH DAILY (Patient taking differently: Take 100 mg by mouth daily. )  . nitroGLYCERIN (NITROSTAT) 0.4 MG SL tablet Place 1 tablet (0.4 mg total) under the tongue every 5 (five) minutes as needed for chest pain. Do not take more than 3 nitroglycerine  . Vitamin D, Ergocalciferol, (DRISDOL) 1.25 MG (50000 UT) CAPS capsule Take 50,000 Units by mouth every 7 (seven) days.     Allergies:   Betadine [povidone iodine], Contrast media [iodinated diagnostic agents], and Fish allergy   Social History   Tobacco Use  . Smoking status: Never Smoker  . Smokeless tobacco: Never Used  Substance Use Topics  . Alcohol use: Not Currently  . Drug use: Never     Family Hx: The patient's family history includes Cancer in his mother; Cervical cancer in his sister; Diabetes in his sister; Heart Problems in his brother, father, and another family  member; Hypertension in his brother; Leukemia in his mother; Stroke in his brother and another family member. There is no history of Heart attack.  ROS:   Please see the history of present illness.    No Fever, chills  or productive cough All other systems reviewed and are negative.   Recent Labs: 01/27/2018: B Natriuretic Peptide 322.6 05/03/2018: ALT 18; BUN 27; Creatinine, Ser 2.01; Hemoglobin 18.7; Platelets 418; Potassium 4.7; Sodium 140   Recent Lipid Panel Lab Results  Component Value Date/Time   CHOL 104 01/13/2018 08:41 AM   TRIG 90 01/13/2018 08:41 AM   HDL 31 (L) 01/13/2018 08:41 AM   CHOLHDL 3.2 11/20/2015 08:18 AM   LDLCALC 55 01/13/2018 08:41 AM    Wt Readings from Last 3 Encounters:  08/31/18 240 lb (108.9 kg)  03/25/18 236 lb 12.4 oz (107.4 kg)  02/10/18 240 lb (108.9 kg)     Objective:    Vital Signs:  BP (!) 157/92   Pulse 80   Ht _0  (1.753 m)   Wt 240 lb (108.9 kg)   BMI 35.44 kg/m    VITAL SIGNS:  reviewed NAD Answers questions appropriately Normal affect Remainder of physical examination not performed (telehealth visit; coronavirus pandemic)  ASSESSMENT & PLAN:    1. Paroxysmal atrial fibrillation-patient is in sinus rhythm based on history.  Continue Coreg and apixaban. 2. Thoracic aortic aneurysm-follow-up CTA December 2020. 3. Coronary artery disease-continue Plavix and statin.  DC asa. No recent chest pain. 4. Hypertension-patient's blood pressure is elevated; I have asked him to track this and we will add additional medications as needed.  He states typically does not run this high. 5. Hyperlipidemia-continue statin. 6. Chronic stage III kidney disease-patient is scheduled to see nephrology.  COVID-19 Education: The importance of social distancing was discussed today.  Time:   Today, I have spent 18 minutes with the patient with telehealth technology discussing the above problems.     Medication Adjustments/Labs and Tests Ordered:  Current medicines are reviewed at length with the patient today.  Concerns regarding medicines are outlined above.   Tests Ordered: No orders of the defined types were placed in this encounter.   Medication Changes: No orders of the defined types were placed in this encounter.   Follow Up:  Virtual Visit or In Person in 6 month(s)  Signed, Kirk Ruths, MD  08/31/2018 9:09 AM  [facility]

## 2018-08-31 ENCOUNTER — Encounter: Payer: Self-pay | Admitting: Cardiology

## 2018-08-31 ENCOUNTER — Telehealth (INDEPENDENT_AMBULATORY_CARE_PROVIDER_SITE_OTHER): Payer: HMO | Admitting: Cardiology

## 2018-08-31 VITALS — BP 157/92 | HR 80 | Ht 69.0 in | Wt 240.0 lb

## 2018-08-31 DIAGNOSIS — I10 Essential (primary) hypertension: Secondary | ICD-10-CM | POA: Diagnosis not present

## 2018-08-31 DIAGNOSIS — I2510 Atherosclerotic heart disease of native coronary artery without angina pectoris: Secondary | ICD-10-CM

## 2018-08-31 DIAGNOSIS — Q2112 Patent foramen ovale: Secondary | ICD-10-CM

## 2018-08-31 DIAGNOSIS — I480 Paroxysmal atrial fibrillation: Secondary | ICD-10-CM

## 2018-08-31 DIAGNOSIS — Q211 Atrial septal defect: Secondary | ICD-10-CM

## 2018-08-31 DIAGNOSIS — I77810 Thoracic aortic ectasia: Secondary | ICD-10-CM

## 2018-08-31 NOTE — Patient Instructions (Signed)

## 2018-09-01 ENCOUNTER — Other Ambulatory Visit: Payer: HMO

## 2018-09-01 ENCOUNTER — Ambulatory Visit: Payer: HMO | Admitting: Oncology

## 2018-09-02 DIAGNOSIS — H2511 Age-related nuclear cataract, right eye: Secondary | ICD-10-CM | POA: Diagnosis not present

## 2018-09-02 DIAGNOSIS — H25811 Combined forms of age-related cataract, right eye: Secondary | ICD-10-CM | POA: Diagnosis not present

## 2018-09-11 ENCOUNTER — Other Ambulatory Visit: Payer: Self-pay | Admitting: Family Medicine

## 2018-09-13 DIAGNOSIS — D631 Anemia in chronic kidney disease: Secondary | ICD-10-CM | POA: Diagnosis not present

## 2018-09-13 DIAGNOSIS — N183 Chronic kidney disease, stage 3 (moderate): Secondary | ICD-10-CM | POA: Diagnosis not present

## 2018-09-13 DIAGNOSIS — R801 Persistent proteinuria, unspecified: Secondary | ICD-10-CM | POA: Diagnosis not present

## 2018-09-13 DIAGNOSIS — I129 Hypertensive chronic kidney disease with stage 1 through stage 4 chronic kidney disease, or unspecified chronic kidney disease: Secondary | ICD-10-CM | POA: Diagnosis not present

## 2018-09-17 DIAGNOSIS — N281 Cyst of kidney, acquired: Secondary | ICD-10-CM | POA: Diagnosis not present

## 2018-09-17 DIAGNOSIS — R801 Persistent proteinuria, unspecified: Secondary | ICD-10-CM | POA: Diagnosis not present

## 2018-09-17 DIAGNOSIS — N19 Unspecified kidney failure: Secondary | ICD-10-CM | POA: Diagnosis not present

## 2018-09-18 ENCOUNTER — Other Ambulatory Visit: Payer: Self-pay | Admitting: Cardiology

## 2018-09-22 DIAGNOSIS — H25812 Combined forms of age-related cataract, left eye: Secondary | ICD-10-CM | POA: Diagnosis not present

## 2018-09-22 DIAGNOSIS — H2512 Age-related nuclear cataract, left eye: Secondary | ICD-10-CM | POA: Diagnosis not present

## 2018-09-28 ENCOUNTER — Inpatient Hospital Stay: Payer: HMO

## 2018-09-28 ENCOUNTER — Inpatient Hospital Stay: Payer: HMO | Admitting: Oncology

## 2018-09-28 ENCOUNTER — Inpatient Hospital Stay: Payer: HMO | Attending: Oncology

## 2018-10-04 ENCOUNTER — Telehealth: Payer: Self-pay

## 2018-10-04 ENCOUNTER — Ambulatory Visit: Payer: Medicare HMO | Admitting: Neurology

## 2018-10-04 ENCOUNTER — Encounter: Payer: Self-pay | Admitting: Neurology

## 2018-10-04 DIAGNOSIS — E559 Vitamin D deficiency, unspecified: Secondary | ICD-10-CM | POA: Diagnosis not present

## 2018-10-04 DIAGNOSIS — R0602 Shortness of breath: Secondary | ICD-10-CM | POA: Diagnosis not present

## 2018-10-04 DIAGNOSIS — Z114 Encounter for screening for human immunodeficiency virus [HIV]: Secondary | ICD-10-CM | POA: Diagnosis not present

## 2018-10-04 DIAGNOSIS — E7800 Pure hypercholesterolemia, unspecified: Secondary | ICD-10-CM | POA: Diagnosis not present

## 2018-10-04 DIAGNOSIS — I10 Essential (primary) hypertension: Secondary | ICD-10-CM | POA: Diagnosis not present

## 2018-10-04 DIAGNOSIS — Z1159 Encounter for screening for other viral diseases: Secondary | ICD-10-CM | POA: Diagnosis not present

## 2018-10-04 DIAGNOSIS — Z1339 Encounter for screening examination for other mental health and behavioral disorders: Secondary | ICD-10-CM | POA: Diagnosis not present

## 2018-10-04 DIAGNOSIS — Z125 Encounter for screening for malignant neoplasm of prostate: Secondary | ICD-10-CM | POA: Diagnosis not present

## 2018-10-04 DIAGNOSIS — E119 Type 2 diabetes mellitus without complications: Secondary | ICD-10-CM | POA: Diagnosis not present

## 2018-10-04 DIAGNOSIS — Z0000 Encounter for general adult medical examination without abnormal findings: Secondary | ICD-10-CM | POA: Diagnosis not present

## 2018-10-04 DIAGNOSIS — R5383 Other fatigue: Secondary | ICD-10-CM | POA: Diagnosis not present

## 2018-10-04 DIAGNOSIS — Z1331 Encounter for screening for depression: Secondary | ICD-10-CM | POA: Diagnosis not present

## 2018-10-04 NOTE — Telephone Encounter (Signed)
Patient no show for appt today. 

## 2018-10-14 ENCOUNTER — Other Ambulatory Visit: Payer: Self-pay | Admitting: Family Medicine

## 2018-10-20 ENCOUNTER — Other Ambulatory Visit: Payer: Self-pay | Admitting: Family Medicine

## 2018-10-22 ENCOUNTER — Other Ambulatory Visit: Payer: Self-pay | Admitting: Family Medicine

## 2018-10-28 ENCOUNTER — Other Ambulatory Visit: Payer: Self-pay | Admitting: Family Medicine

## 2018-11-04 DIAGNOSIS — M79675 Pain in left toe(s): Secondary | ICD-10-CM | POA: Diagnosis not present

## 2018-11-04 DIAGNOSIS — E119 Type 2 diabetes mellitus without complications: Secondary | ICD-10-CM | POA: Diagnosis not present

## 2018-11-04 DIAGNOSIS — M79674 Pain in right toe(s): Secondary | ICD-10-CM | POA: Diagnosis not present

## 2018-11-04 DIAGNOSIS — B351 Tinea unguium: Secondary | ICD-10-CM | POA: Diagnosis not present

## 2018-11-05 ENCOUNTER — Other Ambulatory Visit: Payer: Self-pay | Admitting: Nephrology

## 2018-11-05 ENCOUNTER — Other Ambulatory Visit: Payer: Self-pay

## 2018-11-05 DIAGNOSIS — N183 Chronic kidney disease, stage 3 unspecified: Secondary | ICD-10-CM

## 2018-11-05 NOTE — Patient Outreach (Addendum)
  [place] [facility]) Care Management Chronic Special Needs Program  11/05/2018  Name: James Mcclain DOB: Jul 06, 1945  MRN: [id number]  Mr. Prabhdeep Kirker is enrolled in a chronic special needs plan.. Reviewed and updated care plan.  Client is actively involved with [facility] for complex community care management.  Per client's chart, history of diabetes, atrial fibrillation, heart disease, HTN, CKD and heart failure. Per note 09/23/2018 [facility] working with client on diabetes- mailing out resources for medication assistance for Lantus, Januvia, Invokana and Eliquis.  Next visit scheduled for 11/08/2018. Cardiology visit on 08/31/2018 focus-hypertension.  Goals Addressed            This Visit's Progress   .  Acknowledge receipt of Advanced Directive package   On track   . Advanced Care Planning complete as directed by client within the next 6 months.   On track   . Client understands the importance of follow-up with providers by attending scheduled visits   On track   . Client verbalize knowledge of Heart Failure disease self management skills within the next 22months.   Not on track   . Client will report no worsening of symptoms of Atrial Fibrillation within the next 6 months.   On track   . Client will report no worsening of symptoms related to heart disease within the next 6 months.   On track   . Client will report weighing and recording weights daily within 6 months.   Not on track   . Client will use Assistive Devices as needed and verbalize understanding of device use      . Client will verbalize knowledge of diabetes self-management as evidenced by Hgb A1C <7 or as defined by provider.   On track    Diabetes self management actions:  Glucose monitoring per provider recommendations  Perform Quality checks on blood meter  Eat Healthy  Check feet daily  Visit provider every 3-6 months as directed  Hbg A1C level every 3-6 months.  Eye Exam yearly    . Client will verbalize  knowledge of self management of Hypertension as evidences by BP reading of 140/90 or less; or as defined by provider   On track    Please follow recommendations from providers.    [name] HEMOGLOBIN A1C < 7.0      . Maintain timely refills of diabetic medication as prescribed within the year .      . Maintain timely refills of Heart Failure medication as prescribed within the year    On track   . Obtain annual  Lipid Profile, LDL-C   On track   . Obtain Annual Eye (retinal)  Exam       . Obtain Annual Foot Exam      . Obtain annual screen for micro albuminuria (urine) , nephropathy (kidney problems)      . Obtain Hemoglobin A1C at least 2 times per year      . COMPLETED: Visit Primary Care Provider or Cardiologist at least 2 times per year       Has seen primary care/Cardiologist at least  twice/year.    . Visit Primary Care Provider or Endocrinologist at least 2 times per year          Plan:  RNCM will continue care coordination wth [facility]. RNCM will continue to follow as client's special needs care management coordinator.     Thea Silversmith, RN, MSN, [place] [phone]   .

## 2018-11-08 ENCOUNTER — Other Ambulatory Visit: Payer: Self-pay

## 2018-11-08 MED ORDER — ISOSORBIDE MONONITRATE ER 60 MG PO TB24: 60.0000 mg | ORAL_TABLET | Freq: Every day | ORAL | 2 refills | Status: AC

## 2018-12-06 DIAGNOSIS — Z1331 Encounter for screening for depression: Secondary | ICD-10-CM | POA: Diagnosis not present

## 2018-12-06 DIAGNOSIS — M129 Arthropathy, unspecified: Secondary | ICD-10-CM | POA: Diagnosis not present

## 2018-12-06 DIAGNOSIS — Z1159 Encounter for screening for other viral diseases: Secondary | ICD-10-CM | POA: Diagnosis not present

## 2018-12-06 DIAGNOSIS — Z7189 Other specified counseling: Secondary | ICD-10-CM | POA: Diagnosis not present

## 2018-12-06 DIAGNOSIS — E119 Type 2 diabetes mellitus without complications: Secondary | ICD-10-CM | POA: Diagnosis not present

## 2018-12-06 DIAGNOSIS — Z23 Encounter for immunization: Secondary | ICD-10-CM | POA: Diagnosis not present

## 2018-12-06 DIAGNOSIS — Z0000 Encounter for general adult medical examination without abnormal findings: Secondary | ICD-10-CM | POA: Diagnosis not present

## 2018-12-06 DIAGNOSIS — Z125 Encounter for screening for malignant neoplasm of prostate: Secondary | ICD-10-CM | POA: Diagnosis not present

## 2018-12-06 DIAGNOSIS — Z114 Encounter for screening for human immunodeficiency virus [HIV]: Secondary | ICD-10-CM | POA: Diagnosis not present

## 2018-12-06 DIAGNOSIS — R0602 Shortness of breath: Secondary | ICD-10-CM | POA: Diagnosis not present

## 2018-12-06 DIAGNOSIS — R5383 Other fatigue: Secondary | ICD-10-CM | POA: Diagnosis not present

## 2018-12-06 DIAGNOSIS — E7800 Pure hypercholesterolemia, unspecified: Secondary | ICD-10-CM | POA: Diagnosis not present

## 2018-12-06 DIAGNOSIS — Z1339 Encounter for screening examination for other mental health and behavioral disorders: Secondary | ICD-10-CM | POA: Diagnosis not present

## 2018-12-06 DIAGNOSIS — E559 Vitamin D deficiency, unspecified: Secondary | ICD-10-CM | POA: Diagnosis not present

## 2018-12-09 ENCOUNTER — Other Ambulatory Visit: Payer: Self-pay

## 2018-12-09 MED ORDER — CARVEDILOL 25 MG PO TABS: ORAL_TABLET | ORAL | 1 refills | Status: AC

## 2018-12-17 ENCOUNTER — Other Ambulatory Visit: Payer: Self-pay

## 2018-12-17 MED ORDER — CLOPIDOGREL BISULFATE 75 MG PO TABS: ORAL_TABLET | ORAL | 1 refills | Status: DC

## 2018-12-22 DIAGNOSIS — R0989 Other specified symptoms and signs involving the circulatory and respiratory systems: Secondary | ICD-10-CM | POA: Diagnosis not present

## 2018-12-22 DIAGNOSIS — R9431 Abnormal electrocardiogram [ECG] [EKG]: Secondary | ICD-10-CM | POA: Diagnosis not present

## 2018-12-24 DIAGNOSIS — N1831 Chronic kidney disease, stage 3a: Secondary | ICD-10-CM | POA: Diagnosis not present

## 2018-12-24 DIAGNOSIS — I10 Essential (primary) hypertension: Secondary | ICD-10-CM | POA: Diagnosis not present

## 2018-12-24 DIAGNOSIS — E119 Type 2 diabetes mellitus without complications: Secondary | ICD-10-CM | POA: Diagnosis not present

## 2018-12-24 DIAGNOSIS — Z7901 Long term (current) use of anticoagulants: Secondary | ICD-10-CM | POA: Diagnosis not present

## 2019-01-05 DIAGNOSIS — R801 Persistent proteinuria, unspecified: Secondary | ICD-10-CM | POA: Diagnosis not present

## 2019-01-05 DIAGNOSIS — E0821 Diabetes mellitus due to underlying condition with diabetic nephropathy: Secondary | ICD-10-CM | POA: Diagnosis not present

## 2019-01-05 DIAGNOSIS — I129 Hypertensive chronic kidney disease with stage 1 through stage 4 chronic kidney disease, or unspecified chronic kidney disease: Secondary | ICD-10-CM | POA: Diagnosis not present

## 2019-01-05 DIAGNOSIS — N183 Chronic kidney disease, stage 3 unspecified: Secondary | ICD-10-CM | POA: Diagnosis not present

## 2019-01-25 ENCOUNTER — Encounter: Payer: Self-pay | Admitting: *Deleted

## 2019-01-27 DIAGNOSIS — R42 Dizziness and giddiness: Secondary | ICD-10-CM | POA: Diagnosis not present

## 2019-01-27 DIAGNOSIS — E079 Disorder of thyroid, unspecified: Secondary | ICD-10-CM | POA: Diagnosis not present

## 2019-01-27 DIAGNOSIS — M79605 Pain in left leg: Secondary | ICD-10-CM | POA: Diagnosis not present

## 2019-01-27 DIAGNOSIS — M79604 Pain in right leg: Secondary | ICD-10-CM | POA: Diagnosis not present

## 2019-02-02 ENCOUNTER — Other Ambulatory Visit: Payer: Self-pay | Admitting: Family Medicine

## 2019-02-03 ENCOUNTER — Other Ambulatory Visit: Payer: Self-pay | Admitting: Family Medicine

## 2019-02-19 ENCOUNTER — Encounter (HOSPITAL_COMMUNITY): Payer: Self-pay | Admitting: Emergency Medicine

## 2019-02-19 ENCOUNTER — Emergency Department (HOSPITAL_COMMUNITY): Payer: HMO

## 2019-02-19 ENCOUNTER — Other Ambulatory Visit: Payer: Self-pay

## 2019-02-19 ENCOUNTER — Emergency Department (HOSPITAL_COMMUNITY)
Admission: EM | Admit: 2019-02-19 | Discharge: 2019-02-19 | Discharge status: Against Medical Advice | Payer: HMO | Attending: Emergency Medicine | Admitting: Emergency Medicine

## 2019-02-19 DIAGNOSIS — Z5321 Procedure and treatment not carried out due to patient leaving prior to being seen by health care provider: Secondary | ICD-10-CM | POA: Insufficient documentation

## 2019-02-19 DIAGNOSIS — R079 Chest pain, unspecified: Secondary | ICD-10-CM | POA: Diagnosis not present

## 2019-02-19 DIAGNOSIS — R0789 Other chest pain: Secondary | ICD-10-CM | POA: Diagnosis present

## 2019-02-19 LAB — COMPREHENSIVE METABOLIC PANEL
ALT: 15 U/L (ref 0–44)
AST: 21 U/L (ref 15–41)
Albumin: 3.3 g/dL — ABNORMAL LOW (ref 3.5–5.0)
Alkaline Phosphatase: 102 U/L (ref 38–126)
Anion gap: 9 (ref 5–15)
BUN: 17 mg/dL (ref 8–23)
CO2: 24 mmol/L (ref 22–32)
Calcium: 8.6 mg/dL — ABNORMAL LOW (ref 8.9–10.3)
Chloride: 105 mmol/L (ref 98–111)
Creatinine, Ser: 1.86 mg/dL — ABNORMAL HIGH (ref 0.61–1.24)
GFR calc Af Amer: 41 mL/min — ABNORMAL LOW (ref >60)
GFR calc non Af Amer: 35 mL/min — ABNORMAL LOW (ref >60)
Glucose, Bld: 133 mg/dL — ABNORMAL HIGH (ref 70–99)
Potassium: 4.2 mmol/L (ref 3.5–5.1)
Sodium: 138 mmol/L (ref 135–145)
Total Bilirubin: 0.9 mg/dL (ref 0.3–1.2)
Total Protein: 7.3 g/dL (ref 6.5–8.1)

## 2019-02-19 LAB — CBC
HCT: 58.7 % — ABNORMAL HIGH (ref 39.0–52.0)
Hemoglobin: 14.9 g/dL (ref 13.0–17.0)
MCH: 17.5 pg — ABNORMAL LOW (ref 26.0–34.0)
MCHC: 25.4 g/dL — ABNORMAL LOW (ref 30.0–36.0)
MCV: 69.1 fL — ABNORMAL LOW (ref 80.0–100.0)
Platelets: 352 10*3/uL (ref 150–400)
RBC: 8.5 MIL/uL — ABNORMAL HIGH (ref 4.22–5.81)
RDW: 25.8 % — ABNORMAL HIGH (ref 11.5–15.5)
WBC: 16.5 10*3/uL — ABNORMAL HIGH (ref 4.0–10.5)
nRBC: 0.9 % — ABNORMAL HIGH (ref 0.0–0.2)

## 2019-02-19 LAB — TROPONIN I (HIGH SENSITIVITY): Troponin I (High Sensitivity): 12 ng/L (ref <18)

## 2019-02-19 LAB — LIPASE, BLOOD: Lipase: 34 U/L (ref 11–51)

## 2019-02-19 MED ORDER — SODIUM CHLORIDE 0.9% FLUSH: 3.0000 mL | Freq: Once | INTRAVENOUS | Status: DC

## 2019-02-19 NOTE — ED Notes (Signed)
Pt called for second trop. No response.

## 2019-02-19 NOTE — ED Notes (Signed)
rosaline Heinke (daughter) would like updates [phone]

## 2019-02-19 NOTE — ED Notes (Signed)
No answer when called x 3.  

## 2019-02-19 NOTE — ED Triage Notes (Signed)
C/o pain to L chest and LUQ since 2pm with mild SOB.  Denies any other associated symptoms.

## 2019-02-19 NOTE — ED Notes (Signed)
Pt called for v/s with no response

## 2019-02-23 DIAGNOSIS — N183 Chronic kidney disease, stage 3 unspecified: Secondary | ICD-10-CM | POA: Diagnosis not present

## 2019-02-23 DIAGNOSIS — T45526A Underdosing of antithrombotic drugs, initial encounter: Secondary | ICD-10-CM | POA: Diagnosis not present

## 2019-02-23 DIAGNOSIS — I480 Paroxysmal atrial fibrillation: Secondary | ICD-10-CM | POA: Diagnosis not present

## 2019-02-23 DIAGNOSIS — I959 Hypotension, unspecified: Secondary | ICD-10-CM | POA: Diagnosis not present

## 2019-02-23 DIAGNOSIS — E785 Hyperlipidemia, unspecified: Secondary | ICD-10-CM | POA: Diagnosis not present

## 2019-02-23 DIAGNOSIS — E1122 Type 2 diabetes mellitus with diabetic chronic kidney disease: Secondary | ICD-10-CM | POA: Diagnosis not present

## 2019-02-23 DIAGNOSIS — D735 Infarction of spleen: Secondary | ICD-10-CM | POA: Diagnosis not present

## 2019-02-23 DIAGNOSIS — D45 Polycythemia vera: Secondary | ICD-10-CM | POA: Diagnosis not present

## 2019-02-23 DIAGNOSIS — Z8673 Personal history of transient ischemic attack (TIA), and cerebral infarction without residual deficits: Secondary | ICD-10-CM | POA: Diagnosis not present

## 2019-02-23 DIAGNOSIS — Z91013 Allergy to seafood: Secondary | ICD-10-CM | POA: Diagnosis not present

## 2019-02-23 DIAGNOSIS — Z7901 Long term (current) use of anticoagulants: Secondary | ICD-10-CM | POA: Diagnosis not present

## 2019-02-23 DIAGNOSIS — I129 Hypertensive chronic kidney disease with stage 1 through stage 4 chronic kidney disease, or unspecified chronic kidney disease: Secondary | ICD-10-CM | POA: Diagnosis not present

## 2019-02-23 DIAGNOSIS — I2510 Atherosclerotic heart disease of native coronary artery without angina pectoris: Secondary | ICD-10-CM | POA: Diagnosis not present

## 2019-02-23 DIAGNOSIS — Q211 Atrial septal defect: Secondary | ICD-10-CM | POA: Diagnosis not present

## 2019-02-23 DIAGNOSIS — R1012 Left upper quadrant pain: Secondary | ICD-10-CM | POA: Diagnosis not present

## 2019-02-23 DIAGNOSIS — Z91048 Other nonmedicinal substance allergy status: Secondary | ICD-10-CM | POA: Diagnosis not present

## 2019-02-23 DIAGNOSIS — Z9119 Patient's noncompliance with other medical treatment and regimen: Secondary | ICD-10-CM | POA: Diagnosis not present

## 2019-02-23 DIAGNOSIS — T45516A Underdosing of anticoagulants, initial encounter: Secondary | ICD-10-CM | POA: Diagnosis not present

## 2019-02-23 DIAGNOSIS — N1832 Chronic kidney disease, stage 3b: Secondary | ICD-10-CM | POA: Diagnosis not present

## 2019-02-23 DIAGNOSIS — Z7902 Long term (current) use of antithrombotics/antiplatelets: Secondary | ICD-10-CM | POA: Diagnosis not present

## 2019-02-24 ENCOUNTER — Other Ambulatory Visit: Payer: Self-pay

## 2019-02-24 DIAGNOSIS — D45 Polycythemia vera: Secondary | ICD-10-CM | POA: Diagnosis not present

## 2019-02-24 DIAGNOSIS — N1832 Chronic kidney disease, stage 3b: Secondary | ICD-10-CM | POA: Diagnosis not present

## 2019-02-24 DIAGNOSIS — D735 Infarction of spleen: Secondary | ICD-10-CM | POA: Diagnosis not present

## 2019-02-24 DIAGNOSIS — I2510 Atherosclerotic heart disease of native coronary artery without angina pectoris: Secondary | ICD-10-CM | POA: Diagnosis not present

## 2019-02-24 MED ORDER — ERGOCALCIFEROL 1.25 MG (50000 UT) PO CAPS
50000.00 | ORAL_CAPSULE | ORAL | Status: DC
Start: 2019-02-26 — End: 2019-02-24

## 2019-02-24 MED ORDER — INSULIN LISPRO 100 UNIT/ML ~~LOC~~ SOLN
1.00 | SUBCUTANEOUS | Status: DC
Start: 2019-02-24 — End: 2019-02-24

## 2019-02-24 MED ORDER — CLOPIDOGREL BISULFATE 75 MG PO TABS
75.00 | ORAL_TABLET | ORAL | Status: DC
Start: 2019-02-25 — End: 2019-02-24

## 2019-02-24 MED ORDER — SODIUM CHLORIDE 0.9 % IV SOLN
10.00 | INTRAVENOUS | Status: DC
Start: ? — End: 2019-02-24

## 2019-02-24 MED ORDER — AMLODIPINE BESYLATE 10 MG PO TABS
10.00 | ORAL_TABLET | ORAL | Status: DC
Start: 2019-02-25 — End: 2019-02-24

## 2019-02-24 MED ORDER — APIXABAN 5 MG PO TABS
5.00 | ORAL_TABLET | ORAL | Status: DC
Start: 2019-02-24 — End: 2019-02-24

## 2019-02-24 MED ORDER — INSULIN GLARGINE 100 UNIT/ML ~~LOC~~ SOLN
1.00 | SUBCUTANEOUS | Status: DC
Start: 2019-02-24 — End: 2019-02-24

## 2019-02-24 MED ORDER — GENERIC EXTERNAL MEDICATION
Status: DC
Start: ? — End: 2019-02-24

## 2019-02-24 MED ORDER — INSULIN LISPRO 100 UNIT/ML ~~LOC~~ SOLN
1.00 | SUBCUTANEOUS | Status: DC
Start: ? — End: 2019-02-24

## 2019-02-24 MED ORDER — CARVEDILOL 12.5 MG PO TABS
25.00 | ORAL_TABLET | ORAL | Status: DC
Start: 2019-02-24 — End: 2019-02-24

## 2019-02-24 MED ORDER — ISOSORBIDE MONONITRATE ER 60 MG PO TB24
60.00 | ORAL_TABLET | ORAL | Status: DC
Start: 2019-02-25 — End: 2019-02-24

## 2019-02-24 MED ORDER — INSULIN GLARGINE 100 UNIT/ML ~~LOC~~ SOLN
1.00 | SUBCUTANEOUS | Status: DC
Start: ? — End: 2019-02-24

## 2019-02-24 MED ORDER — ALLOPURINOL 100 MG PO TABS
100.00 | ORAL_TABLET | ORAL | Status: DC
Start: 2019-02-25 — End: 2019-02-24

## 2019-02-24 MED ORDER — TAMSULOSIN HCL 0.4 MG PO CAPS
0.40 | ORAL_CAPSULE | ORAL | Status: DC
Start: 2019-02-25 — End: 2019-02-24

## 2019-02-24 MED ORDER — ALBUTEROL SULFATE HFA 108 (90 BASE) MCG/ACT IN AERS
2.00 | INHALATION_SPRAY | RESPIRATORY_TRACT | Status: DC
Start: ? — End: 2019-02-24

## 2019-02-24 MED ORDER — NITROGLYCERIN 0.4 MG SL SUBL
0.40 | SUBLINGUAL_TABLET | SUBLINGUAL | Status: DC
Start: ? — End: 2019-02-24

## 2019-02-24 MED ORDER — ATORVASTATIN CALCIUM 40 MG PO TABS
80.00 | ORAL_TABLET | ORAL | Status: DC
Start: 2019-02-25 — End: 2019-02-24

## 2019-02-24 NOTE — Patient Outreach (Addendum)
  [place] [facility]) Care Management Chronic Special Needs Program  02/24/2019  Name: James Mcclain DOB: 02-Nov-1945  MRN: [id number]  Mr. Pepe Latona is enrolled in a chronic special needs plan. Client presented to [facility] medical center on 02/23/2019 with complaint of left upper abdominal pain-admitted with mutliple splenic infarcts. Per policy/procedure, individualized care plan sent to [facility] utilization management.  Client is actively engaged with [facility] for complex community care management services.  Plan:RNCM will continue to follow as client's CSNP chronic care management coordinator and assist with care coordination as needed.   Thea Silversmith, RN, MSN, [place] [phone]  .

## 2019-02-28 ENCOUNTER — Other Ambulatory Visit: Payer: Self-pay

## 2019-02-28 NOTE — Patient Outreach (Addendum)
  [place] [facility]) Care Management Chronic Special Needs Program  02/28/2019  Name: DOYE GERACI DOB: 04/02/1945  MRN: [id number]  Mr. Jc Gindhart is enrolled in a chronic special needs plan. Client admitted to [facility] 02/23/2019 and discharged on 02/25/2019-Splenic infarction/Polycythemia Vera. Per policy/procedure individualized care plan updated.   Client is actively engaged with [facility] for complex community care management.Transition of care to be completed by [facility] Team.  Five wishes book and MOLST form provided and client encouraged to complete per [facility]. Per [facility] note on 11/08/2018- [facility] provider assisted client with obtaining medications (Isosorbide and allopurinol). Per [facility] 12/14/2018 note: Invokana medication assistance form sent to client.   [facility] making outreach attempts for follow up.  Goals Addressed            This Visit's Progress   . COMPLETED:  Acknowledge receipt of Advanced Directive package       Provided by [facility]    . COMPLETED: Advanced Care Planning complete as directed by client within the next 6 months.       Five wishes provided by [facility]-encouraged to complete. Please call [facility], if you need assistance with completing.    . COMPLETED: Client understands the importance of follow-up with providers by attending scheduled visits       Has attended follow up visits.    . COMPLETED: Client will report no worsening of symptoms of Atrial Fibrillation within the next 6 months.       No documentation noted-on Eliquis    . Client will use Assistive Devices as needed and verbalize understanding of device use      . COMPLETED: Client will verbalize knowledge of self management of Hypertension as evidences by BP reading of 140/90 or less; or as defined by provider       Per [facility] controlled.    . General - Client will not be readmitted within 30 days (C-SNP)-discharged 02/25/2019       Please follow  discharge instructions and call provider if you have any questions. Please attend all follow up appointments as scheduled. Please take your medications as prescribed. Please call [place] as needed [phone]).     . Maintain timely refills of diabetic medication as prescribed within the year .       Please follow up with [facility] if you are not able to get prescriptions filled.    . Maintain timely refills of Heart Failure medication as prescribed within the year    Not on track    Please follow up with [facility] if you are not able to get prescriptions filled.    . Obtain Hemoglobin A1C at least 2 times per year       Completed 11/08/2018 A1C 7.3 and 02/14/2019 A1C 8.1       Plan: RNCM will continue care coordination and collaboration with [facility] Team. RNCM will continue to follow as client's C-SNP chronic care management coordinator.   Thea Silversmith, RN, MSN, [place] [phone]   .

## 2019-03-01 ENCOUNTER — Other Ambulatory Visit: Payer: Self-pay

## 2019-03-01 NOTE — Patient Outreach (Signed)
  [place] [facility]) Care Management Chronic Special Needs Program    03/01/2019  Name: James Mcclain, DOB: Aug 02, 1945  MRN: [id number]   Mr. [name] is enrolled in a chronic special needs. Client hospitalized 02/23/2019 - 02/25/2019 at  [facility]. Client is actively engaged with [place] for complex community care management. Transition of care to be completed by [facility].  Care Coordination. RNCM updated [facility] of client's discharge.  Plan: RNCM will continue to follow and collaborate with [facility] as indicated.  Thea Silversmith, RN, MSN, [place] [phone]

## 2019-03-04 ENCOUNTER — Other Ambulatory Visit: Payer: Self-pay

## 2019-03-04 NOTE — Patient Outreach (Addendum)
  [place] [facility]) Care Management Chronic Special Needs Program    03/04/2019  Name: James Mcclain, DOB: December 19, 1945  MRN: [id number]   Mr. [name] is enrolled in a chronic special needs plan. Client is actively engaged with [facility] for complex community care management. Client discharged 02/25/2019. Per [facility] notes:Transition of care call completed on 03/02/2019. Per [facility], client reports "feeling much better"; his sister is managing his pill box; has filled all his prescriptions; apetite good; utilizing rolling walker daily; client has discharge instructions; No home health at discharge; client with no concerns. Next visit scheduled with [facility] provider 03/29/2019.  Plan: RNCM will continue to follow as client's C-SNP chronic care management coordinator. RNCM will assist [facility] as indicated. Care plan updated 02/28/2019, RNCM will follow up next month.  Thea Silversmith, RN, MSN, [place] [phone]

## 2019-03-09 ENCOUNTER — Other Ambulatory Visit: Payer: Self-pay

## 2019-03-09 NOTE — Patient Outreach (Signed)
  [place] [facility]) Care Management Chronic Special Needs Program   03/09/2019  Name: James Mcclain, DOB: 1946/01/02  MRN: [id number]  The client was discussed in today's interdisciplinary care team meeting.  The following issues were discussed:  Client's needs, Care Plan, Coordination of care and Care transitions  Participants present:                  Thea Silversmith, MSN, RN, CCM                     Melissa Sandlin RN,BSN,CCM, CDE  Kelli Churn, RN, CCM, CDE [name] RN, BSN, CCM            Maryella Shivers, MD            Bary Castilla, RN, BSN, MS, CCM Coralie Carpen, MD   Plan: CM will follow up.   Thea Silversmith, RN, MSN, [place] [phone]

## 2019-03-10 ENCOUNTER — Telehealth: Payer: Self-pay | Admitting: *Deleted

## 2019-03-10 NOTE — Telephone Encounter (Signed)
A message was left, re: his follow up visit. 

## 2019-03-21 ENCOUNTER — Ambulatory Visit: Payer: HMO

## 2019-03-29 ENCOUNTER — Other Ambulatory Visit: Payer: Self-pay

## 2019-03-29 NOTE — Patient Outreach (Signed)
  [place] [facility]) Care Management Chronic Special Needs Program    03/29/2019  Name: James Mcclain, DOB: 06-Feb-1946  MRN: [id number]   James Mcclain is enrolled in a chronic special needs plan for Diabetes. RNCM called to follow up assist client with completion of health risk assessment and update individualized care plan. No answer. Unable to leave message.  Plan: RNCM will outreach within the next one -two weeks.  Thea Silversmith, RN, MSN, [place] [phone]

## 2019-03-31 ENCOUNTER — Other Ambulatory Visit: Payer: Self-pay

## 2019-03-31 NOTE — Patient Outreach (Signed)
  [facility] [facility]) Care Management Chronic Special Needs Program  03/31/2019  Name: James Mcclain DOB: 1945-06-08  MRN: [id number]  Mr. Adis Granger is enrolled in a chronic special needs plan for Diabetes. Chronic Care Management Coordinator telephoned client to follow up one month post hospitalization; complete health risk assessment and to update individualized care plan.  RNCM reviewed the chronic care management program, importance of client participation, and taking their care plan to all provider appointments and inpatient facilities.    RNCM spoke with client. Two client identifiers confirmed. When asked about how he was doing since hospitalization, client states, "It's good". He reports he has been to one follow up appointment, but does not remember which appointment. Client is a very poor historian and states no one is there to assist him in completing health risk assessment. RNCM asked client if there was someone else he would give permission for RNCM to speak with regarding his health and client states, "no".  Client confirms a history of diabetes, hypertension, heart disease and atrial fibrillation, stroke and questions congestive heart failure. Per chart, cardiology note per Dr. Stanford Breed list congestive heart failure listed in past medical history.   Last office visit with Dr. Kirk Ruths 08/31/2018 Last office visit with Dr. [facility] 12/22/2018 Last office visit with Dr. Garlan Fillers (nephrologist) 01/05/2019 Last office visit with Dr. Jorje Guild (hematologist) 03/30/2019- per office note no phlebotomy due to normal hematocrit hemoglobin 12.5/ hematocrit 43.5.  Client initially started completing the health risk assessment, but call was abruptly cut off. RNCM attempted return call, but the phone just rang with no answer. Unable to leave message.  Plan: RNCM will outreach within the next 1-2 weeks to attempt completion of health risk assessment and  care plan update.   Thea Silversmith, RN, MSN, [place] [phone]

## 2019-04-15 ENCOUNTER — Other Ambulatory Visit: Payer: Self-pay

## 2019-04-15 ENCOUNTER — Encounter: Payer: Self-pay | Admitting: Cardiology

## 2019-04-15 ENCOUNTER — Telehealth (INDEPENDENT_AMBULATORY_CARE_PROVIDER_SITE_OTHER): Payer: HMO | Admitting: Cardiology

## 2019-04-15 VITALS — Ht 69.0 in | Wt 220.0 lb

## 2019-04-15 DIAGNOSIS — I129 Hypertensive chronic kidney disease with stage 1 through stage 4 chronic kidney disease, or unspecified chronic kidney disease: Secondary | ICD-10-CM

## 2019-04-15 DIAGNOSIS — N183 Chronic kidney disease, stage 3 unspecified: Secondary | ICD-10-CM | POA: Diagnosis not present

## 2019-04-15 DIAGNOSIS — I712 Thoracic aortic aneurysm, without rupture, unspecified: Secondary | ICD-10-CM

## 2019-04-15 DIAGNOSIS — E785 Hyperlipidemia, unspecified: Secondary | ICD-10-CM

## 2019-04-15 DIAGNOSIS — I77810 Thoracic aortic ectasia: Secondary | ICD-10-CM

## 2019-04-15 DIAGNOSIS — I2510 Atherosclerotic heart disease of native coronary artery without angina pectoris: Secondary | ICD-10-CM | POA: Diagnosis not present

## 2019-04-15 DIAGNOSIS — I480 Paroxysmal atrial fibrillation: Secondary | ICD-10-CM | POA: Diagnosis not present

## 2019-04-15 NOTE — Patient Outreach (Signed)
  [place] [facility]) Care Management Chronic Special Needs Program    04/15/2019  Name: James Mcclain, DOB: May 07, 1945  MRN: [id number]   [name] is enrolled in a chronic special needs plan for Diabetes.  RNCM called to follow up and complete health risk assessment and update individualized care plan. Client answered the phone. Two HIPAA identifiers confirmed. As RNCM explained the purpose of the call and request to finish completing the health risk assessment, client states, "I'm not goin do that". RNCM attempted to explain further the benefit of care management, however client hung up again.  Plan: RNCM will complete care plan based on available data.   Thea Silversmith, RN, MSN, [place] [phone]

## 2019-04-15 NOTE — Progress Notes (Signed)
Virtual Visit via Video Note   This visit type was conducted due to national recommendations for restrictions regarding the COVID-19 Pandemic (e.g. social distancing) in an effort to limit this patient's exposure and mitigate transmission in our community.  Due to his co-morbid illnesses, this patient is at least at moderate risk for complications without adequate follow up.  This format is felt to be most appropriate for this patient at this time.  All issues noted in this document were discussed and addressed.  A limited physical exam was performed with this format.  Please refer to the patient's chart for his consent to telehealth for [facility].   Date:  04/15/2019   ID:  James Mcclain, DOB 1945/09/20, MRN [id number]  Patient Location:Home Provider Location: Home  PCP:  Simona Huh, NP  Cardiologist:  Dr Stanford Breed  Evaluation Performed:  Follow-Up Visit  Chief Complaint:  FU PAF and CAD  History of Present Illness:    FU PAF and CAD. Patient had a cryptogenic stroke in 2015 treated with tPAand afuloop monitor showed paroxysmal atrial fibrillation. Had PCI of LAD 2014.  Also with PFO documented by prior TEE.Last echo 7/19 showed normal LV function, trace AI, mild LAE. Had PCI of RI with DES 7/19. Cardiac catheterization December 2019 showed 85% ostial ramus which was treated with balloon angioplasty, 40% LAD, 65% sidebranch and ostial first diagonal, 40% circumflex and 40% right coronary artery.  Chest CT December 2019 showed 4.5 cm ascending thoracic aortic aneurysm.  Since last seen patient denies dyspnea, chest pain, palpitations, syncope or bleeding.   The patient does not have symptoms concerning for COVID-19 infection (fever, chills, cough, or new shortness of breath).    Past Medical History:  Diagnosis Date  . Arthritis    "right hand" (09/21/2017)  . CHF (congestive heart failure) ([place])   . CKD (chronic kidney disease), stage III   . Contrast media allergy   .  Coronary artery disease    a. s/p Xience DES to the LAD 07/2012. b. 09/21/17 Synergy stent to RI.   . Dilated aortic root ([place])    a. Mildly dilated by echo 09/2013, f/u MRA scheduled for 12/2014.  [name] Dyspnea   . Elevated PSA   . Essential hypertension   . Gout    "on daily RX" (09/21/2017)  . Heart murmur   . History of kidney stones   . Hyperlipidemia   . Insulin dependent diabetes mellitus   . Kidney stones   . 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 On tele 09/2013.  [name] PAF (paroxysmal atrial fibrillation) ([place])   . Paroxysmal atrial fibrillation (HCC)   . PFO (patent foramen ovale)    a. By echo 09/2013.  [name] Polycythemia   . Prostate cancer ([place]) 07/15/12   Gleason 7, external beam radiation Tx, Dr. Pilar Jarvis  . PVC's (premature ventricular contractions)   . 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 Stroke [facility])    a. Cryptogenic, 09/2013. Small PFO noted on echo but neg LE duplex. PAF eventually seen on monitor.  . Thoracic aortic aneurysm ([place])    a. 4.5cm by CT 01/2018.  . Vitamin D deficiency    Past Surgical History:  Procedure Laterality Date  . COLONOSCOPY WITH PROPOFOL N/A 02/22/2016   Procedure: COLONOSCOPY WITH PROPOFOL;  Surgeon: Carol Ada, MD;  Location: [facility];  Service: Endoscopy;  Laterality: N/A;  . CORONARY ANGIOPLASTY WITH STENT PLACEMENT  08/05/2012  LAD, 1 stent  . CORONARY BALLOON ANGIOPLASTY N/A 02/02/2018   Procedure: CORONARY BALLOON ANGIOPLASTY;  Surgeon: Leonie Man, MD;  Location: [place] CV LAB;  Service: Cardiovascular;  Laterality: N/A;  . CORONARY STENT INTERVENTION N/A 09/21/2017   Procedure: CORONARY STENT INTERVENTION;  Surgeon: Belva Crome, MD;  Location: [place] CV LAB;  Service: Cardiovascular;  Laterality: N/A;  . EP IMPLANTABLE DEVICE N/A 08/23/2014   Procedure: Loop Recorder Insertion;  Surgeon: Evans Lance, MD;  Location: [place] CV LAB;  Service: Cardiovascular;  Laterality: N/A;    . INSERTION PROSTATE RADIATION SEED    . JOINT REPLACEMENT     left knee replacement   . LEFT HEART CATH AND CORONARY ANGIOGRAPHY N/A 09/21/2017   Procedure: LEFT HEART CATH AND CORONARY ANGIOGRAPHY;  Surgeon: Belva Crome, MD;  Location: [place] CV LAB;  Service: Cardiovascular;  Laterality: N/A;  . LITHOTRIPSY  2006  . PERCUTANEOUS CORONARY STENT INTERVENTION (PCI-S) N/A 08/05/2012   Procedure: PERCUTANEOUS CORONARY STENT INTERVENTION (PCI-S);  Surgeon: Sherren Mocha, MD;  Location: [facility] CATH LAB;  Service: Cardiovascular;  Laterality: N/A;  . PROSTATE BIOPSY  07/15/2012; 09/23/2013  . RIGHT/LEFT HEART CATH AND CORONARY ANGIOGRAPHY N/A 02/02/2018   Procedure: RIGHT/LEFT HEART CATH AND CORONARY ANGIOGRAPHY;  Surgeon: Leonie Man, MD;  Location: [place] CV LAB;  Service: Cardiovascular;  Laterality: N/A;  . TEE WITHOUT CARDIOVERSION N/A 09/27/2013   Procedure: TRANSESOPHAGEAL ECHOCARDIOGRAM (TEE);  Surgeon: Larey Dresser, MD;  Location: [place];  Service: Cardiovascular;  Laterality: N/A;  . TONSILLECTOMY  1990s  . TOTAL KNEE ARTHROPLASTY  01/24/2011   Procedure: TOTAL KNEE ARTHROPLASTY;  Surgeon: Alta Corning;  Location: [place];  Service: Orthopedics;  Laterality: Left;  COMPUTER ASSISTED LEFT  TOTAL KNEE REPLACEMENT. Anesthesia a combination of regional and general.  . ULTRASOUND GUIDANCE FOR VASCULAR ACCESS  02/02/2018   Procedure: Ultrasound Guidance For Vascular Access;  Surgeon: Leonie Man, MD;  Location: [place] CV LAB;  Service: Cardiovascular;;     Current Meds  Medication Sig  . allopurinol (ZYLOPRIM) 100 MG tablet Take 2 tablets by mouth once daily  . amLODipine (NORVASC) 10 MG tablet Take 1 tablet by mouth once daily  . apixaban (ELIQUIS) 5 MG TABS tablet Take 1 tablet (5 mg total) by mouth 2 (two) times daily.  [name] aspirin EC 81 MG EC tablet Take 1 tablet (81 mg total) by mouth daily. (Patient taking differently: Take 81 mg by mouth daily as needed for  pain. )  . atorvastatin (LIPITOR) 80 MG tablet TAKE 1 TABLET(80 MG) BY MOUTH DAILY AT 6 PM  . Blood Glucose Monitoring Suppl (ONETOUCH VERIO) w/Device KIT 1 Device by Does not apply route 2 (two) times daily. E11.9  . canagliflozin (INVOKANA) 100 MG TABS tablet Take 100 mg by mouth daily before breakfast.  . carvedilol (COREG) 25 MG tablet TAKE 1 TABLET(25 MG) BY MOUTH TWICE DAILY  . clopidogrel (PLAVIX) 75 MG tablet TAKE 1 TABLET(75 MG) BY MOUTH DAILY WITH BREAKFAST  . glucose blood test strip Check BS bid DX: E11.9  . Insulin Glargine (LANTUS SOLOSTAR) 100 UNIT/ML Solostar Pen Inject 45 Units into the skin daily.  . Insulin Syringe-Needle U-100 (SAFETY INSULIN SYRINGES) 30G X 5/16" 0.5 ML MISC As needed for insulin  . isosorbide mononitrate (IMDUR) 60 MG 24 hr tablet Take 1 tablet (60 mg total) by mouth daily.  [name] JANUVIA 100 MG tablet TAKE 1 TABLET(100 MG) BY MOUTH DAILY  .  Vitamin D, Ergocalciferol, (DRISDOL) 1.25 MG (50000 UT) CAPS capsule Take 50,000 Units by mouth every 7 (seven) days.     Allergies:   Betadine [povidone iodine], Contrast media [iodinated diagnostic agents], and Fish allergy   Social History   Tobacco Use  . Smoking status: Never Smoker  . Smokeless tobacco: Never Used  Substance Use Topics  . Alcohol use: Not Currently  . Drug use: Never     Family Hx: The patient's family history includes Cancer in his mother; Cervical cancer in his sister; Diabetes in his sister; Heart Problems in his brother, father, and another family member; Hypertension in his brother; Leukemia in his mother; Stroke in his brother and another family member. There is no history of Heart attack.  ROS:   Please see the history of present illness.    No Fever, chills  or productive cough All other systems reviewed and are negative.   Recent Labs: 02/19/2019: ALT 15; BUN 17; Creatinine, Ser 1.86; Hemoglobin 14.9; Platelets 352; Potassium 4.2; Sodium 138   Recent Lipid Panel Lab Results    Component Value Date/Time   CHOL 104 01/13/2018 08:41 AM   TRIG 90 01/13/2018 08:41 AM   HDL 31 (L) 01/13/2018 08:41 AM   CHOLHDL 3.2 11/20/2015 08:18 AM   LDLCALC 55 01/13/2018 08:41 AM    Wt Readings from Last 3 Encounters:  04/15/19 220 lb (99.8 kg)  08/31/18 240 lb (108.9 kg)  03/25/18 236 lb 12.4 oz (107.4 kg)     Objective:    Vital Signs:  Ht '5\' 9"'$  (1.753 m)   Wt 220 lb (99.8 kg)   BMI 32.49 kg/m    VITAL SIGNS:  reviewed NAD Answers questions appropriately Normal affect Remainder of physical examination not performed (telehealth visit; coronavirus pandemic)  Electrocardiogram from February 19, 2019 personally reviewed-sinus tachycardia, right bundle branch block, left ventricular hypertrophy, inferior infarct.  ASSESSMENT & PLAN:    1. Paroxysmal atrial fibrillation-no recurrences based on history.  Continue carvedilol and apixaban. 2. Thoracic aortic aneurysm-most recent creatinine 2.32.  Will avoid CTA.  Arrange noncontrast chest CT to further assess aortic root. 3. Hypertension-blood pressure controlled at home.  Continue present medications. 4. Hyperlipidemia-continue statin. 5. Coronary artery disease-continue Lipitor at present dose.  Discontinue Plavix.  Patient is also not taking aspirin.  It has been over 1 year since his most recent intervention. 6. Chronic stage III kidney disease-followed by nephrology.  COVID-19 Education: The importance of social distancing was discussed today.  Time:   Today, I have spent 16 minutes with the patient with telehealth technology discussing the above problems.     Medication Adjustments/Labs and Tests Ordered: Current medicines are reviewed at length with the patient today.  Concerns regarding medicines are outlined above.   Tests Ordered: No orders of the defined types were placed in this encounter.   Medication Changes: No orders of the defined types were placed in this encounter.   Follow Up:  Either In  Person or Virtual in 6 month(s)  Signed, Kirk Ruths, MD  04/15/2019 12:55 PM    [place]

## 2019-04-15 NOTE — Patient Instructions (Signed)
Medication Instructions:  STOP PLAVIX OR CLOPIDOGREL   *If you need a refill on your cardiac medications before your next appointment, please call your pharmacy*  Lab Work: If you have labs (blood work) drawn today and your tests are completely normal, you will receive your results only by: [name] MyChart Message (if you have MyChart) OR . A paper copy in the mail If you have any lab test that is abnormal or we need to change your treatment, we will call you to review the results.  Testing/Procedures: CT SCAN WO CONTRAST TO FOLLOW UP ON THORACIC ANEURYSM-[address]=[facility]  Follow-Up: At [facility], you and your health needs are our priority.  As part of our continuing mission to provide you with exceptional heart care, we have created designated Provider Care Teams.  These Care Teams include your primary Cardiologist (physician) and Advanced Practice Providers (APPs -  Physician Assistants and Nurse Practitioners) who all work together to provide you with the care you need, when you need it.  Your next appointment:   6 month(s)  The format for your next appointment:   Either In Person or Virtual  Provider:   You may see Kirk Ruths, MD or one of the following Advanced Practice Providers on your designated Care Team:    Kerin Ransom, PA-C  [place], [place]  Coletta Memos, [place]

## 2019-04-18 ENCOUNTER — Telehealth: Payer: Self-pay | Admitting: *Deleted

## 2019-04-18 NOTE — Telephone Encounter (Signed)
Spoke with patient regarding appointment for CT chest no contrast scheduled Thursday 04/28/19 at 12:00pm at [address]--arrival time is 11:40 am for check in

## 2019-04-19 ENCOUNTER — Other Ambulatory Visit: Payer: Self-pay

## 2019-04-19 NOTE — Patient Outreach (Addendum)
[facility]) Care Management Chronic Special Needs Program  04/19/2019  Name: ABUBAKARR BROXTERMAN DOB: [birth year]  MRN: [id number]  Mr. Riku Yohey is enrolled in a chronic special needs plan for  Diabetes. A completed health risk assessment has not been received from the client. RNCM attempted to complete and client completed partial assessment and refused to complete the remaining.   The client's individualized care plan was developed based on available data.   Client is active with [place] for complex community care management.   A1C 7.3 on 11/08/2018   Last home visit with [facility] was 03/29/2019.   Next scheduled visit with [facility] 07/28/2019  Goals Addressed            This Visit's Progress   . Client verbalize knowledge of Heart Failure disease self management skills within the next 49months.   On track    Heart failure self-management actions: Know signs and symptoms of congestive heart failure exacerbation. Know when to call the doctor to who to call. Verbalize how fluid intake can affect congestive heart failure. Eat healthy and monitor salt intake. Visit your primary care or cardiologist as scheduled. Review education article: "heart failure keeping track of your weight each day". Call if you have any questions. [phone].    [name] Client will report no worsening of symptoms of Atrial Fibrillation within the next 6 months.       Please follow up with your provider as scheduled. Please take medications as prescribed. Signs/symptoms of worsening: racing or irregular heart beat that may be uncomfortable shortness of breath with our without chest pain weakness; dizziness. Review the atrial fibrillation action plan located in your [employer] Calendar. Call your CSNP nurse care management coordinator [phone]), if you have any questions.     . Client will report no worsening of symptoms related to heart disease within the next 6 months.   On track      Renew goal year 2021 Please follow up with your providers as scheduled. Take your medications as prescribed. Mailed education: "coronary artery disease: what you can do". Please review and call if you have any questions.     . Client will report weighing and recording weights daily within 6 months.   On track    Goal renewed 04/19/2019 Mailed education: "heart failure: keeping track of your weight each day". Please review and call if you have any questions.    . Client will use Assistive Devices as needed and verbalize understanding of device use   On track    Please call if you are having any difficulty with use of glucose meter.    . Client will verbalize knowledge of diabetes self-management as evidenced by Hgb A1C <7 or as defined by provider.   On track    Diabetes self management actions:  Glucose monitoring per provider recommendations  Perform Quality checks on blood meter  Eat Healthy  Check feet daily  Visit provider every 3-6 months as directed  Hbg A1C level every 3-6 months.  Eye Exam yearly    . COMPLETED: General - Client will not be readmitted within 30 days (C-[facility])-discharged 02/25/2019       No readmission within 30 days    . HEMOGLOBIN A1C < 7.0       Last A1C 7.3 on 11/08/2018  Diabetes self management actions:  Glucose monitoring per provider recommendations  Eat Healthy  Check feet daily  Visit provider every 3-6 months as directed  Hbg A1C  level every 3-6 months.  Eye Exam yearly    . Maintain timely refills of diabetic medication as prescribed within the year .   On track    It is important to follow up with your providers as scheduled for annual exam and medication refills. Call your CSNP nurse care coordinator([phone]) or your [facility] nurse care coordinator [phone]) if you have any difficulty obtaining your medications.     . Maintain timely refills of Heart Failure medication as prescribed within the year    On track     Call your CSNP nurse care coordinator([phone]) or your [facility] nurse care coordinator [phone]) if you have any difficulty obtaining your medications.    . Obtain annual  Lipid Profile, LDL-C   On track    Done 04/13/2018  Your goal for LDL is less than 70 mg/dl, if you are at high risk for complication. Try to avoid saturated fats, trans-fats, and eat more fiber.    . Obtain Annual Eye (retinal)  Exam    On track    It is important to see your provider as scheduled for annual exam.    . Obtain Annual Foot Exam   On track    It is important to see your provider as scheduled for an annual exam.    . Obtain annual screen for micro albuminuria (urine) , nephropathy (kidney problems)   On track    Done 05/26/2018    . Obtain Hemoglobin A1C at least 2 times per year   On track    Completed 11/08/2018 A1C 7.3 and 02/14/2019 A1C 8.1    . Visit Primary Care Provider or Endocrinologist at least 2 times per year    On track    Done 01/27/2019, 12/24/2018, 07/27/2018  Last office visit with primary care was 03/23/2019       Plan:  . Send unsuccessful outreach letter with a copy of individualized care plan to client . Send individualized care plan to provider . Send educational material  Chronic care management coordinator will attempt outreach per tier level in 6 months.  Thea Silversmith, RN, MSN, [place] [phone]   [facility] listed Dr. Apolonio Schneiders as client's primary care provider. RNCM called provider office and confirmed this.

## 2019-04-22 ENCOUNTER — Ambulatory Visit: Payer: HMO | Attending: Internal Medicine

## 2019-04-22 DIAGNOSIS — Z23 Encounter for immunization: Secondary | ICD-10-CM | POA: Insufficient documentation

## 2019-04-22 NOTE — Progress Notes (Signed)
   Covid-19 Vaccination Clinic  Name:  UNNAMED DAZEY    MRN: [id number] DOB: 10-16-1945  04/22/2019  Mr. Misner was observed post Covid-19 immunization for 15 minutes without incidence. He was provided with Vaccine Information Sheet and instruction to access the V-Safe system.   Mr. Wyrick was instructed to call 911 with any severe reactions post vaccine: [name] Difficulty breathing  . Swelling of your face and throat  . A fast heartbeat  . A bad rash all over your body  . Dizziness and weakness    Immunizations Administered    Name Date Dose VIS Date Route   Pfizer COVID-19 Vaccine 04/22/2019  3:01 PM 0.3 mL 02/04/2019 Intramuscular   Manufacturer: [place]   Lot: [id number]   [place]: [id number]

## 2019-04-28 ENCOUNTER — Other Ambulatory Visit: Payer: HMO

## 2019-05-02 ENCOUNTER — Telehealth: Payer: Self-pay | Admitting: Cardiology

## 2019-05-02 NOTE — Telephone Encounter (Signed)
Left message for patient to call and reschedule CT chest without contrast at [place]

## 2019-05-03 NOTE — Telephone Encounter (Signed)
Left message for patient to call and rescheduled CT chest appointment that was missed on 04/28/19

## 2019-05-06 NOTE — Telephone Encounter (Signed)
Left  message for patient to call ans reschedule CT chest that was missed on 05/04/19

## 2019-05-10 NOTE — Telephone Encounter (Signed)
Spoke with patient regarding appointment for Chest CT without contrast scheduled 05/24/19---- 10:30 am arrival for a 10:50 am study at [place].  Patient voiced his understanding

## 2019-05-18 ENCOUNTER — Ambulatory Visit: Payer: HMO | Attending: Internal Medicine

## 2019-05-18 DIAGNOSIS — Z23 Encounter for immunization: Secondary | ICD-10-CM

## 2019-05-18 NOTE — Progress Notes (Signed)
   Covid-19 Vaccination Clinic  Name:  James Mcclain    MRN: [id number] DOB: June 04, 1945  05/18/2019  [name] was observed post Covid-19 immunization for 15 minutes without incident. He was provided with Vaccine Information Sheet and instruction to access the V-Safe system.   [name] was instructed to call 911 with any severe reactions post vaccine: [name] Difficulty breathing  . Swelling of face and throat  . A fast heartbeat  . A bad rash all over body  . Dizziness and weakness   Immunizations Administered    Name Date Dose VIS Date Route   Pfizer COVID-19 Vaccine 05/18/2019  2:49 PM 0.3 mL 02/04/2019 Intramuscular   Manufacturer: [place]   Lot: [id number]   [place]: [id number]

## 2019-05-24 ENCOUNTER — Other Ambulatory Visit: Payer: HMO

## 2019-07-01 DIAGNOSIS — Z79899 Other long term (current) drug therapy: Secondary | ICD-10-CM | POA: Diagnosis not present

## 2019-07-26 DIAGNOSIS — R404 Transient alteration of awareness: Secondary | ICD-10-CM | POA: Diagnosis not present

## 2019-08-11 ENCOUNTER — Telehealth: Payer: Self-pay | Admitting: Cardiology

## 2019-08-11 NOTE — Telephone Encounter (Signed)
James Mcclain is calling back to check on the status of the patient's death certificate due to whoever she spoke with yesterday advising her they could not find it and telling her to callback today. She states she has ran out of time to turn it in to the Health Department and needs it as soon as possible. Please advise.

## 2019-08-11 NOTE — Telephone Encounter (Signed)
Sent to medical records. 

## 2019-08-12 ENCOUNTER — Telehealth: Payer: Self-pay | Admitting: Cardiology

## 2019-08-12 NOTE — Telephone Encounter (Signed)
Patients daughter on the line to inform us that patient has passed away and that the doctor needs to sign the death certificate but says that the nurse say that it is lost and blaming each other. Wants someone to call her asap regarding the certificate

## 2019-08-12 NOTE — Telephone Encounter (Signed)
Follow up   James Mcclain from [place] calling to follow up death certificate

## 2019-08-12 NOTE — Telephone Encounter (Signed)
Spoke with pt daughter, aware I have the death certificate and dr Stanford Breed will be back Monday to sign.

## 2019-08-12 NOTE — Telephone Encounter (Signed)
Routed to medical records & primary nurse

## 2019-08-12 NOTE — Telephone Encounter (Signed)
[facility] was calling back to check on the status of the death certificate

## 2019-08-16 ENCOUNTER — Telehealth: Payer: Self-pay | Admitting: Cardiology

## 2019-08-16 NOTE — Telephone Encounter (Signed)
Tried to call daughter back the number will not go thru [employer] to see if she knows what this message is regarding, she states that she does not know. She will call daughter and see what is needed and CB if something else is needed.

## 2019-08-16 NOTE — Telephone Encounter (Signed)
Pam returning call.

## 2019-08-16 NOTE — Telephone Encounter (Signed)
S/w Maryln Manuel she states that the "state" will not accept the current death certificate it has an "abbreviation" on it and they will need it "spelled out". Will discuss this with HIM.

## 2019-08-16 NOTE — Telephone Encounter (Signed)
Patient's daughter called stating an abbreviation was done on the death cerficate by Dr. Stanford Breed and the dept.of health.  She is wondering if Dr. Stanford Breed will do another one.

## 2019-08-25 DIAGNOSIS — 419620001 Death: Secondary | SNOMED CT | POA: Diagnosis not present

## 2019-08-25 DEATH — deceased

## 2019-09-06 ENCOUNTER — Other Ambulatory Visit: Payer: Self-pay

## 2019-09-06 NOTE — Patient Outreach (Signed)
  [facility]) Care Management Chronic Special Needs Program    09/06/2019  Name: YI HAUGAN, DOB: 09/22/1945  MRN: [id number]   RNCM received notification that client is deceased.   Plan: Case closed, send case closure letter to primary care provider.  Thea Silversmith, RN, MSN, [place] [phone]

## 2019-10-13 ENCOUNTER — Ambulatory Visit: Payer: HMO
